# Patient Record
Sex: Female | Born: 1950
Health system: Southern US, Community
[De-identification: ages and names within clinical notes are randomized; demographics above are authoritative.]

## PROBLEM LIST (undated history)

## (undated) DIAGNOSIS — J13 Pneumonia due to Streptococcus pneumoniae: Secondary | ICD-10-CM

## (undated) DIAGNOSIS — E162 Hypoglycemia, unspecified: Secondary | ICD-10-CM

## (undated) DIAGNOSIS — M1990 Unspecified osteoarthritis, unspecified site: Secondary | ICD-10-CM

## (undated) HISTORY — PX: ABDOMINAL HYSTERECTOMY: SHX81

---

## 2003-10-29 ENCOUNTER — Emergency Department (HOSPITAL_COMMUNITY): Admission: AD | Admit: 2003-10-29 | Discharge: 2003-10-29 | Payer: Self-pay | Admitting: Family Medicine

## 2003-11-01 ENCOUNTER — Emergency Department (HOSPITAL_COMMUNITY): Admission: AD | Admit: 2003-11-01 | Discharge: 2003-11-01 | Payer: Self-pay | Admitting: Family Medicine

## 2007-05-12 ENCOUNTER — Emergency Department (HOSPITAL_COMMUNITY): Admission: EM | Admit: 2007-05-12 | Discharge: 2007-05-12 | Payer: Self-pay | Admitting: Emergency Medicine

## 2007-05-18 ENCOUNTER — Emergency Department (HOSPITAL_COMMUNITY): Admission: EM | Admit: 2007-05-18 | Discharge: 2007-05-18 | Payer: Self-pay | Admitting: Emergency Medicine

## 2008-04-30 ENCOUNTER — Emergency Department (HOSPITAL_COMMUNITY): Admission: EM | Admit: 2008-04-30 | Discharge: 2008-04-30 | Payer: Self-pay | Admitting: Family Medicine

## 2012-05-07 ENCOUNTER — Encounter (HOSPITAL_COMMUNITY): Payer: Self-pay | Admitting: *Deleted

## 2012-05-07 ENCOUNTER — Emergency Department (INDEPENDENT_AMBULATORY_CARE_PROVIDER_SITE_OTHER): Payer: Self-pay

## 2012-05-07 ENCOUNTER — Emergency Department (INDEPENDENT_AMBULATORY_CARE_PROVIDER_SITE_OTHER)
Admission: EM | Admit: 2012-05-07 | Discharge: 2012-05-07 | Disposition: A | Discharge status: Home / Self Care | Payer: Self-pay | Source: Home / Self Care

## 2012-05-07 DIAGNOSIS — I493 Ventricular premature depolarization: Secondary | ICD-10-CM

## 2012-05-07 DIAGNOSIS — J209 Acute bronchitis, unspecified: Secondary | ICD-10-CM

## 2012-05-07 DIAGNOSIS — I4949 Other premature depolarization: Secondary | ICD-10-CM

## 2012-05-07 DIAGNOSIS — F17200 Nicotine dependence, unspecified, uncomplicated: Secondary | ICD-10-CM

## 2012-05-07 HISTORY — DX: Unspecified osteoarthritis, unspecified site: M19.90

## 2012-05-07 HISTORY — DX: Pneumonia due to Streptococcus pneumoniae: J13

## 2012-05-07 HISTORY — DX: Hypoglycemia, unspecified: E16.2

## 2012-05-07 MED ORDER — ALBUTEROL SULFATE (5 MG/ML) 0.5% IN NEBU
2.5000 mg | INHALATION_SOLUTION | Freq: Once | RESPIRATORY_TRACT | Status: AC
  Administered 2012-05-07: 2.5 mg via RESPIRATORY_TRACT

## 2012-05-07 MED ORDER — ALBUTEROL SULFATE (5 MG/ML) 0.5% IN NEBU
INHALATION_SOLUTION | RESPIRATORY_TRACT | Status: AC
  Filled 2012-05-07: qty 0.5

## 2012-05-07 MED ORDER — DOXYCYCLINE HYCLATE 100 MG PO CAPS: 100.0000 mg | ORAL_CAPSULE | Freq: Two times a day (BID) | ORAL | Status: AC

## 2012-05-07 MED ORDER — ALBUTEROL SULFATE HFA 108 (90 BASE) MCG/ACT IN AERS: 2.0000 | INHALATION_SPRAY | RESPIRATORY_TRACT | Status: DC | PRN

## 2012-05-07 MED ORDER — ALBUTEROL SULFATE (5 MG/ML) 0.5% IN NEBU
2.5000 mg | INHALATION_SOLUTION | Freq: Once | RESPIRATORY_TRACT | Status: AC
Start: 2012-05-07 — End: 2012-05-07
  Administered 2012-05-07: 2.5 mg via RESPIRATORY_TRACT

## 2012-05-07 MED ORDER — IPRATROPIUM BROMIDE 0.02 % IN SOLN
0.5000 mg | Freq: Once | RESPIRATORY_TRACT | Status: AC
  Administered 2012-05-07: 0.5 mg via RESPIRATORY_TRACT

## 2012-05-07 NOTE — ED Provider Notes (Signed)
History     CSN: [id number]  Arrival date & time 05/07/12  0835   None     Chief Complaint  Patient presents with  . Cough  . Generalized Body Aches    (Consider location/radiation/quality/duration/timing/severity/associated sxs/prior treatment) HPI Comments: Presents with wheezing, cough, fever, sore throat, decreased apetite,. No sputum production.  St smokes 1PPD for >30 yrs. Denies dyspnea or chest pain.  Does not have a primary care provider.   Patient is a 61 y.o. female presenting with cough.  Cough Associated symptoms include wheezing. Pertinent negatives include no chest pain.    Past Medical History  Diagnosis Date  . Arthritis   . Hypoglycemia   . Pneumonia, pneumococcal     Past Surgical History  Procedure Date  . Abdominal hysterectomy     History reviewed. No pertinent family history.  History  Substance Use Topics  . Smoking status: Heavy Tobacco Smoker -- 1.0 packs/day  . Smokeless tobacco: Not on file  . Alcohol Use: No    OB History    Grav Para Term Preterm Abortions TAB SAB Ect Mult Living                  Review of Systems  Constitutional: Positive for fever, activity change and fatigue.  HENT: Positive for congestion and postnasal drip. Negative for neck pain.   Respiratory: Positive for cough and wheezing. Negative for chest tightness.   Cardiovascular: Negative for chest pain.  Gastrointestinal: Negative.   Genitourinary: Negative.   Musculoskeletal: Negative.     Allergies  Codeine  Home Medications   Current Outpatient Rx  Name Route Sig Dispense Refill  . ALBUTEROL SULFATE HFA 108 (90 BASE) MCG/ACT IN AERS Inhalation Inhale 2 puffs into the lungs every 4 (four) hours as needed for wheezing. 1 Inhaler 2  . DOXYCYCLINE HYCLATE 100 MG PO CAPS Oral Take 1 capsule (100 mg total) by mouth 2 (two) times daily. 20 capsule 0    BP 129/90  Pulse 88  Temp 98.5 F (36.9 C) (Oral)  Resp 32  SpO2 93%  Physical Exam    Constitutional: She is oriented to person, place, and time. She appears well-developed and well-nourished.  HENT:       EAC's with cerumen OP with minor erythema and clear PND  Neck: Normal range of motion. Neck supple.  Cardiovascular: Normal rate and normal heart sounds.   Pulmonary/Chest: She has wheezes.  Musculoskeletal: Normal range of motion.  Neurological: She is alert and oriented to person, place, and time.  Skin: Skin is warm and dry.    ED Course  Procedures (including critical care time)  Labs Reviewed - No data to display Dg Chest 2 View  05/07/2012  *RADIOLOGY REPORT*  Clinical Data: Cough, congestion and fever.  CHEST - 2 VIEW  Comparison: No priors.  Findings: Mild diffuse interstitial prominence and bronchial wall thickening may suggest mild bronchitis.  No focal consolidative airspace disease.  No pleural effusions.  Old healed fracture of the posterolateral aspect of the right seventh rib.  No pneumothorax.  Pulmonary vasculature is normal.  Heart size is normal. The patient is rotated to the left on today's exam, resulting in distortion of the mediastinal contours and reduced diagnostic sensitivity and specificity for mediastinal pathology. Atherosclerotic calcifications are noted within the arch of the aorta.  IMPRESSION: 1.  Mild diffuse interstitial prominence and bronchial wall thickening may suggest mild bronchitis.  Whether or not this is acute or chronic is uncertain, as  no prior studies are available for comparison. 2.  Atherosclerosis. 3.  Old healed fracture of the posterolateral aspect of the right seventh rib.   Original Report Authenticated By: Florencia Reasons, M.D.      1. Bronchitis with bronchospasm   2. Tobacco use disorder   3. PVC's (premature ventricular contractions)       MDM  Duoneb x 1. Post neb estimated 70% improvement in objective air movement and decrease in wheezing. She feels better and breathing better.  A second Albuterol is  ordered EKG: NSR with PVC's.  LVH with early repolarization changes. T wave inversions inferolateral leads Doxy 100 bid  X 10 d Albuterol HFA for home use.         Hayden Rasmussen, NP 05/07/12 1056

## 2012-05-07 NOTE — ED Provider Notes (Signed)
Medical screening examination/treatment/procedure(s) were performed by non-physician practitioner and as supervising physician I was immediately available for consultation/collaboration.  Raynald Blend, MD 05/07/12 1140

## 2012-05-07 NOTE — ED Notes (Signed)
Pt is here with complaints of non productive cough X 1 week with body aches.  Pt states she has hx of PNA, no asthma.  Smokes 1/pk a day.  Bilat wheezes noted.

## 2016-08-09 ENCOUNTER — Emergency Department (HOSPITAL_COMMUNITY)
Admission: EM | Admit: 2016-08-09 | Discharge: 2016-08-10 | Disposition: A | Discharge status: Home / Self Care | Payer: Medicare Other | Attending: Emergency Medicine | Admitting: Emergency Medicine

## 2016-08-09 ENCOUNTER — Encounter (HOSPITAL_COMMUNITY): Payer: Self-pay | Admitting: *Deleted

## 2016-08-09 DIAGNOSIS — F17200 Nicotine dependence, unspecified, uncomplicated: Secondary | ICD-10-CM | POA: Insufficient documentation

## 2016-08-09 DIAGNOSIS — R05 Cough: Secondary | ICD-10-CM | POA: Diagnosis present

## 2016-08-09 DIAGNOSIS — J069 Acute upper respiratory infection, unspecified: Secondary | ICD-10-CM | POA: Diagnosis not present

## 2016-08-09 MED ORDER — PSEUDOEPHEDRINE HCL ER 120 MG PO TB12
120.0000 mg | ORAL_TABLET | Freq: Two times a day (BID) | ORAL | Status: DC
  Administered 2016-08-09: 120 mg via ORAL
  Filled 2016-08-09: qty 1

## 2016-08-09 MED ORDER — PSEUDOEPHEDRINE HCL ER 120 MG PO TB12: 120.0000 mg | ORAL_TABLET | Freq: Two times a day (BID) | ORAL | 0 refills | Status: DC | PRN

## 2016-08-09 MED ORDER — GUAIFENESIN 100 MG/5ML PO SYRP: 100.0000 mg | ORAL_SOLUTION | ORAL | 0 refills | Status: DC | PRN

## 2016-08-09 MED ORDER — GUAIFENESIN 100 MG/5ML PO SOLN
5.0000 mL | Freq: Once | ORAL | Status: AC
  Administered 2016-08-09: 100 mg via ORAL
  Filled 2016-08-09: qty 5

## 2016-08-09 MED ORDER — IBUPROFEN 200 MG PO TABS
600.0000 mg | ORAL_TABLET | Freq: Once | ORAL | Status: AC
  Administered 2016-08-09: 600 mg via ORAL
  Filled 2016-08-09: qty 3

## 2016-08-09 NOTE — Discharge Instructions (Signed)
Take the medication as directed Follow up with your PCP Try visiting [facility] of Social [facility] to get your PCP changed to a [place] based physician

## 2016-08-09 NOTE — ED Provider Notes (Signed)
[facility]-EMERGENCY DEPT Provider Note   CSN: [id number] Arrival date & time: 08/09/16  2305 By signing my name below, I, [name], attest that this documentation has been prepared under the direction and in the presence of Earley FavorGail Meldon Hanzlik, FNP. Electronically Signed: [name], ED Scribe. 08/09/16. 11:27 PM.  History   Chief Complaint Chief Complaint  Patient presents with  . Nasal Congestion  . Cough   HPI Comments: [name] is a 65 y.o. female with no pertinent PMHx, who presents to the Emergency Department by EMS from a shelter complaining of nonproductive cough onset two weeks ago with associated congestion and decreased appetite. Pt states her pain is significantly worse at night or she's lying flat. She has not taken any OTC medications PTA. Denies h/o similar symptoms. Pt further denies fever.   The history is provided by the patient. No language interpreter was used.    Past Medical History:  Diagnosis Date  . Arthritis   . Hypoglycemia   . Pneumonia, pneumococcal (HCC)     There are no active problems to display for this patient.   Past Surgical History:  Procedure Laterality Date  . ABDOMINAL HYSTERECTOMY      OB History    No data available       Home Medications    Prior to Admission medications   Medication Sig Start Date End Date Taking? Authorizing Provider  albuterol (PROVENTIL HFA;VENTOLIN HFA) 108 (90 BASE) MCG/ACT inhaler Inhale 2 puffs into the lungs every 4 (four) hours as needed for wheezing. 05/07/12 05/07/13  Hayden Rasmussenavid Mabe, NP  guaifenesin (ROBITUSSIN) 100 MG/5ML syrup Take 5-10 mLs (100-200 mg total) by mouth every 4 (four) hours as needed for cough. 08/09/16   Earley FavorGail Saralee Bolick, NP  pseudoephedrine (SUDAFED 12 HOUR) 120 MG 12 hr tablet Take 1 tablet (120 mg total) by mouth every 12 (twelve) hours as needed for congestion. 08/09/16   Earley FavorGail Cesare Sumlin, NP    Family History No family history on file.  Social History Social History  Substance Use Topics  .  Smoking status: Heavy Tobacco Smoker    Packs/day: 1.00  . Smokeless tobacco: Never Used  . Alcohol use No     Allergies   Codeine   Review of Systems Review of Systems  Constitutional: Positive for appetite change. Negative for fever.  HENT: Positive for congestion, postnasal drip and rhinorrhea. Negative for sore throat.   Respiratory: Positive for cough.   All other systems reviewed and are negative.    Physical Exam Updated Vital Signs BP 132/58 (BP Location: Left Arm)   Pulse 110   Temp 98.9 F (37.2 C) (Oral)   Resp 18   SpO2 95%   Physical Exam  Constitutional: She appears well-developed and well-nourished.  HENT:  Head: Normocephalic.  Right Ear: External ear normal.  Left Ear: External ear normal.  Nose: Nose normal.  Mouth/Throat: Oropharynx is clear and moist.  Eyes: Conjunctivae are normal.  Neck: Normal range of motion.  Cardiovascular: Normal rate.   Pulmonary/Chest: Effort normal. No respiratory distress.  Abdominal: She exhibits no distension.  Musculoskeletal: Normal range of motion.  Neurological: She is alert.  Skin: Skin is warm and dry.  Psychiatric: She has a normal mood and affect. Her behavior is normal.  Nursing note and vitals reviewed.    ED Treatments / Results  DIAGNOSTIC STUDIES: Oxygen Saturation is 95% on RA, adequate by my interpretation.    COORDINATION OF CARE: 11:27 PM Discussed treatment plan with pt at  bedside which includes symptomatic treatment and pt agreed to plan.  Labs (all labs ordered are listed, but only abnormal results are displayed) Labs Reviewed - No data to display  EKG  EKG Interpretation None       Radiology No results found.  Procedures Procedures (including critical care time)  Medications Ordered in ED Medications  ibuprofen (ADVIL,MOTRIN) tablet 600 mg (not administered)  guaiFENesin (ROBITUSSIN) 100 MG/5ML solution 100 mg (not administered)  pseudoephedrine (SUDAFED) 12 hr tablet  120 mg (not administered)     Initial Impression / Assessment and Plan / ED Course  I have reviewed the triage vital signs and the nursing notes.  Pertinent labs & imaging results that were available during my care of the patient were reviewed by me and considered in my medical decision making (see chart for details).  Clinical Course   Patient will be given a decongestant and a cough medication and referral to community wellness as [place], and [facility]    Final Clinical Impressions(s) / ED Diagnoses   Final diagnoses:  Upper respiratory tract infection, unspecified type    New Prescriptions New Prescriptions   GUAIFENESIN (ROBITUSSIN) 100 MG/5ML SYRUP    Take 5-10 mLs (100-200 mg total) by mouth every 4 (four) hours as needed for cough.   PSEUDOEPHEDRINE (SUDAFED 12 HOUR) 120 MG 12 HR TABLET    Take 1 tablet (120 mg total) by mouth every 12 (twelve) hours as needed for congestion.   I personally performed the services described in this documentation, which was scribed in my presence. The recorded information has been reviewed and is accurate.    Earley FavorGail Paulino Cork, NP 08/09/16 [id number]    Earley FavorGail Caeleigh Prohaska, NP 08/09/16 [phone]    Mancel BaleElliott Wentz, MD 08/10/16 [phone]

## 2016-08-09 NOTE — ED Triage Notes (Signed)
Per EMS report: pt coming from a shelter and presents with a head congestion, unproductive cough, lack of appetite. EMS reports a slight diminished lung in pt's bases.  Pt a/o x 4 and ambulatory.  EMS VS: BP: 138/88, HR: 102, RR: 22, 95% RA, CBG: 129

## 2016-08-09 NOTE — ED Notes (Signed)
Bed: [id number] Expected date:  Expected time:  Means of arrival:  Comments: Congestion

## 2017-05-14 ENCOUNTER — Encounter (HOSPITAL_COMMUNITY): Payer: Self-pay | Admitting: Emergency Medicine

## 2017-05-14 DIAGNOSIS — Z5321 Procedure and treatment not carried out due to patient leaving prior to being seen by health care provider: Secondary | ICD-10-CM | POA: Diagnosis present

## 2017-05-14 NOTE — ED Triage Notes (Signed)
Pt states that she was bit by a spider today and now has lt hand swelling and pain.

## 2017-05-14 NOTE — ED Notes (Signed)
Pt not in the lobby when name called

## 2017-05-15 ENCOUNTER — Emergency Department (HOSPITAL_COMMUNITY)
Admission: EM | Admit: 2017-05-15 | Discharge: 2017-05-15 | Discharge status: Against Medical Advice | Payer: Medicare Other | Attending: Emergency Medicine | Admitting: Emergency Medicine

## 2017-05-15 NOTE — ED Notes (Signed)
No answer when called for vitals. 

## 2017-09-04 ENCOUNTER — Encounter (HOSPITAL_COMMUNITY): Payer: Self-pay

## 2017-09-04 ENCOUNTER — Emergency Department (HOSPITAL_COMMUNITY)
Admission: EM | Admit: 2017-09-04 | Discharge: 2017-09-04 | Disposition: A | Discharge status: Home / Self Care | Payer: Medicare Other | Attending: Emergency Medicine | Admitting: Emergency Medicine

## 2017-09-04 ENCOUNTER — Other Ambulatory Visit: Payer: Self-pay

## 2017-09-04 DIAGNOSIS — R600 Localized edema: Secondary | ICD-10-CM | POA: Insufficient documentation

## 2017-09-04 DIAGNOSIS — Z87891 Personal history of nicotine dependence: Secondary | ICD-10-CM | POA: Insufficient documentation

## 2017-09-04 DIAGNOSIS — R609 Edema, unspecified: Secondary | ICD-10-CM

## 2017-09-04 DIAGNOSIS — Z79899 Other long term (current) drug therapy: Secondary | ICD-10-CM | POA: Insufficient documentation

## 2017-09-04 DIAGNOSIS — R2243 Localized swelling, mass and lump, lower limb, bilateral: Secondary | ICD-10-CM | POA: Diagnosis present

## 2017-09-04 LAB — BASIC METABOLIC PANEL
Anion gap: 10 (ref 5–15)
BUN: 18 mg/dL (ref 6–20)
CALCIUM: 8.4 mg/dL — AB (ref 8.9–10.3)
CO2: 22 mmol/L (ref 22–32)
Chloride: 110 mmol/L (ref 101–111)
Creatinine, Ser: 1.13 mg/dL — ABNORMAL HIGH (ref 0.44–1.00)
GFR calc Af Amer: 57 mL/min — ABNORMAL LOW (ref >60)
GFR, EST NON AFRICAN AMERICAN: 50 mL/min — AB (ref >60)
GLUCOSE: 85 mg/dL (ref 65–99)
Potassium: 2.9 mmol/L — ABNORMAL LOW (ref 3.5–5.1)
Sodium: 142 mmol/L (ref 135–145)

## 2017-09-04 LAB — CBC
HCT: 37.5 % (ref 36.0–46.0)
Hemoglobin: 12.5 g/dL (ref 12.0–15.0)
MCH: 30.7 pg (ref 26.0–34.0)
MCHC: 33.3 g/dL (ref 30.0–36.0)
MCV: 92.1 fL (ref 78.0–100.0)
PLATELETS: 198 10*3/uL (ref 150–400)
RBC: 4.07 MIL/uL (ref 3.87–5.11)
RDW: 15.7 % — AB (ref 11.5–15.5)
WBC: 9 10*3/uL (ref 4.0–10.5)

## 2017-09-04 LAB — BRAIN NATRIURETIC PEPTIDE: B Natriuretic Peptide: 204.1 pg/mL — ABNORMAL HIGH (ref 0.0–100.0)

## 2017-09-04 MED ORDER — POTASSIUM CHLORIDE CRYS ER 20 MEQ PO TBCR
40.0000 meq | EXTENDED_RELEASE_TABLET | Freq: Once | ORAL | Status: AC
Start: 2017-09-04 — End: 2017-09-04
  Administered 2017-09-04: 40 meq via ORAL
  Filled 2017-09-04: qty 2

## 2017-09-04 NOTE — ED Notes (Addendum)
Pt verbalizes understanding of d/c instructions. Pt ambulatory at d/c with all belongings.   

## 2017-09-04 NOTE — ED Provider Notes (Signed)
Patient placed in Quick Look pathway, seen and evaluated for chief complaint of BLE edema x several days. Hx same.  Pertinent H&P findings include no CP/SOB. No N/V/D. No leg pain. No pain in general. Lasix in past. None now.  Based on initial evaluation, labs are indicated and radiology studies are not indicated.  Patient counseled on process, plan, and necessity for staying for completing the evaluation.    Audry PiliMohr, Keondra Haydu, PA-C 09/04/17 1511    Margarita Grizzleay, Danielle, MD 09/04/17 [phone]

## 2017-09-04 NOTE — ED Triage Notes (Addendum)
Bilateral lower extremity edema x 3 days. Hx of same. 3+ pitting up to knees VS 122/62 Hr 84 18 rr cbg 136

## 2017-09-04 NOTE — ED Provider Notes (Signed)
[facility] Provider Note   CSN: [id number] Arrival date & time: 09/04/17  1500     History   Chief Complaint No chief complaint on file.   HPI [name] is a 67 y.o. female.  Patient presents to the ED with a chief complaint of lower extremity swelling.  She states that this is something that she has lived with for years.  She reports that the swelling comes and goes, but is generally improved when she is walking more.  She states that recently she has been walking less because she has been dealing with trying to find new housing.  She states that she has taken lasix in the past, but is off of it now.  She denies any chest pain, SOB, fever, chills, or cough.  She reports chronic urge incontinence.     The history is provided by the patient. No language interpreter was used.    Past Medical History:  Diagnosis Date  . Arthritis   . Hypoglycemia   . Pneumonia, pneumococcal (HCC)     There are no active problems to display for this patient.   Past Surgical History:  Procedure Laterality Date  . ABDOMINAL HYSTERECTOMY      OB History    No data available       Home Medications    Prior to Admission medications   Medication Sig Start Date End Date Taking? Authorizing Provider  acetaminophen (TYLENOL) 325 MG tablet Take 650 mg by mouth every 6 (six) hours as needed for mild pain.   Yes [provider]    Family History No family history on file.  Social History Social History   Tobacco Use  . Smoking status: Heavy Tobacco Smoker    Packs/day: 1.00  . Smokeless tobacco: Never Used  Substance Use Topics  . Alcohol use: No  . Drug use: No     Allergies   Codeine   Review of Systems Review of Systems  All other systems reviewed and are negative.    Physical Exam Updated Vital Signs BP (!) 119/42 (BP Location: Left Arm)   Pulse 78   Temp 98.7 F (37.1 C) (Oral)   Resp 16   SpO2 98%    Physical Exam  Constitutional: She is oriented to person, place, and time. She appears well-developed and well-nourished.  HENT:  Head: Normocephalic and atraumatic.  Eyes: Conjunctivae and EOM are normal. Pupils are equal, round, and reactive to light.  Neck: Normal range of motion. Neck supple.  Cardiovascular: Normal rate and regular rhythm. Exam reveals no gallop and no friction rub.  No murmur heard. Pulmonary/Chest: Effort normal and breath sounds normal. No respiratory distress. She has no wheezes. She has no rales. She exhibits no tenderness.  CTAB  Abdominal: Soft. Bowel sounds are normal. She exhibits no distension and no mass. There is no tenderness. There is no rebound and no guarding.  Musculoskeletal: Normal range of motion. She exhibits edema. She exhibits no tenderness.  1+ pitting edema in bilateral lower extremities  Neurological: She is alert and oriented to person, place, and time.  Skin: Skin is warm and dry.  No evidence of cellulitis or abscess  Psychiatric: She has a normal mood and affect. Her behavior is normal. Judgment and thought content normal.  Nursing note and vitals reviewed.    ED Treatments / Results  Labs (all labs ordered are listed, but only abnormal results are displayed) Labs Reviewed  CBC - Abnormal;  Notable for the following components:      Result Value   RDW 15.7 (*)    All other components within normal limits  BASIC METABOLIC PANEL - Abnormal; Notable for the following components:   Potassium 2.9 (*)    Creatinine, Ser 1.13 (*)    Calcium 8.4 (*)    GFR calc non Af Amer 50 (*)    GFR calc Af Amer 57 (*)    All other components within normal limits  BRAIN NATRIURETIC PEPTIDE - Abnormal; Notable for the following components:   B Natriuretic Peptide 204.1 (*)    All other components within normal limits    EKG  EKG Interpretation None       Radiology No results found.  Procedures Procedures (including critical care  time)  Medications Ordered in ED Medications  potassium chloride SA (K-DUR,KLOR-CON) CR tablet 40 mEq (not administered)     Initial Impression / Assessment and Plan / ED Course  I have reviewed the triage vital signs and the nursing notes.  Pertinent labs & imaging results that were available during my care of the patient were reviewed by me and considered in my medical decision making (see chart for details).     Patient with bilateral lower extremity swelling.  Acute on chronic.  VSS.  No chest pain or SOB.  Bilateral, doubt DVT.  No sign of infection.    Recommend PCP follow-up, compression stockings, and elevation.  Patient understands and agrees with the plan.    Will replete K.  Final Clinical Impressions(s) / ED Diagnoses   Final diagnoses:  Peripheral edema    ED Discharge Orders    None       Roxy Horseman, PA-C 09/04/17 2312    Loren Racer, MD 09/04/17 [phone]

## 2017-09-10 ENCOUNTER — Emergency Department (HOSPITAL_COMMUNITY)
Admission: EM | Admit: 2017-09-10 | Discharge: 2017-09-10 | Disposition: A | Discharge status: Home / Self Care | Payer: Medicare Other | Attending: Emergency Medicine | Admitting: Emergency Medicine

## 2017-09-10 ENCOUNTER — Encounter (HOSPITAL_COMMUNITY): Payer: Self-pay | Admitting: Emergency Medicine

## 2017-09-10 DIAGNOSIS — M25562 Pain in left knee: Secondary | ICD-10-CM | POA: Insufficient documentation

## 2017-09-10 DIAGNOSIS — M1990 Unspecified osteoarthritis, unspecified site: Secondary | ICD-10-CM | POA: Insufficient documentation

## 2017-09-10 DIAGNOSIS — M25561 Pain in right knee: Secondary | ICD-10-CM | POA: Diagnosis present

## 2017-09-10 DIAGNOSIS — R609 Edema, unspecified: Secondary | ICD-10-CM | POA: Diagnosis not present

## 2017-09-10 DIAGNOSIS — F17200 Nicotine dependence, unspecified, uncomplicated: Secondary | ICD-10-CM | POA: Diagnosis not present

## 2017-09-10 LAB — COMPREHENSIVE METABOLIC PANEL
ALK PHOS: 97 U/L (ref 38–126)
ALT: 26 U/L (ref 14–54)
AST: 30 U/L (ref 15–41)
Albumin: 2.8 g/dL — ABNORMAL LOW (ref 3.5–5.0)
Anion gap: 10 (ref 5–15)
BILIRUBIN TOTAL: 0.4 mg/dL (ref 0.3–1.2)
BUN: 14 mg/dL (ref 6–20)
CALCIUM: 8.4 mg/dL — AB (ref 8.9–10.3)
CHLORIDE: 104 mmol/L (ref 101–111)
CO2: 24 mmol/L (ref 22–32)
CREATININE: 1.06 mg/dL — AB (ref 0.44–1.00)
GFR, EST NON AFRICAN AMERICAN: 53 mL/min — AB (ref >60)
Glucose, Bld: 89 mg/dL (ref 65–99)
Potassium: 4.2 mmol/L (ref 3.5–5.1)
Sodium: 138 mmol/L (ref 135–145)
TOTAL PROTEIN: 5.9 g/dL — AB (ref 6.5–8.1)

## 2017-09-10 LAB — CBC WITH DIFFERENTIAL/PLATELET
Basophils Absolute: 0 10*3/uL (ref 0.0–0.1)
Basophils Relative: 1 %
EOS PCT: 3 %
Eosinophils Absolute: 0.2 10*3/uL (ref 0.0–0.7)
HEMATOCRIT: 37 % (ref 36.0–46.0)
Hemoglobin: 11.7 g/dL — ABNORMAL LOW (ref 12.0–15.0)
LYMPHS ABS: 1.8 10*3/uL (ref 0.7–4.0)
LYMPHS PCT: 26 %
MCH: 30.1 pg (ref 26.0–34.0)
MCHC: 31.6 g/dL (ref 30.0–36.0)
MCV: 95.1 fL (ref 78.0–100.0)
Monocytes Absolute: 0.5 10*3/uL (ref 0.1–1.0)
Monocytes Relative: 7 %
Neutro Abs: 4.3 10*3/uL (ref 1.7–7.7)
Neutrophils Relative %: 63 %
PLATELETS: 178 10*3/uL (ref 150–400)
RBC: 3.89 MIL/uL (ref 3.87–5.11)
RDW: 15.2 % (ref 11.5–15.5)
WBC: 6.7 10*3/uL (ref 4.0–10.5)

## 2017-09-10 MED ORDER — IBUPROFEN 400 MG PO TABS
400.0000 mg | ORAL_TABLET | Freq: Once | ORAL | Status: AC
  Administered 2017-09-10: 400 mg via ORAL
  Filled 2017-09-10: qty 1

## 2017-09-10 MED ORDER — POTASSIUM CHLORIDE CRYS ER 20 MEQ PO TBCR
20.0000 meq | EXTENDED_RELEASE_TABLET | Freq: Once | ORAL | Status: AC
  Administered 2017-09-10: 20 meq via ORAL
  Filled 2017-09-10: qty 1

## 2017-09-10 MED ORDER — FUROSEMIDE 20 MG PO TABS
20.0000 mg | ORAL_TABLET | Freq: Once | ORAL | Status: AC
  Administered 2017-09-10: 20 mg via ORAL
  Filled 2017-09-10: qty 1

## 2017-09-10 NOTE — ED Notes (Signed)
Pt stable upon discharge. Provided resources from Child psychotherapistsocial worker and bus pass.

## 2017-09-10 NOTE — ED Notes (Signed)
Pt. In pod E cleaning self up. Will transfer back to hall once she is finished.

## 2017-09-10 NOTE — ED Notes (Signed)
Social worker at bedside to speak with patient

## 2017-09-10 NOTE — ED Triage Notes (Signed)
Pt arrives via EMS from street with c/o knee pain x1 week and bilateral leg swelling for a month.

## 2017-09-10 NOTE — ED Notes (Addendum)
Upon attempting to discharge patient, patient states "I do not want to be discharged." pt advised that the doctor does not have any criteria to admit her and that we are providing her resources from the social work. Pt being uncooperative when this RN requesting she verify her name and birthday, refused vital signs.

## 2017-09-10 NOTE — ED Provider Notes (Signed)
[facility] EMERGENCY DEPARTMENT Provider Note   CSN: [id number] Arrival date & time: 09/10/17  0046     History   Chief Complaint Chief Complaint  Patient presents with  . Knee Pain    HPI [name] is a 67 y.o. female.  Patient presents to the emergency department for evaluation of bilateral knee pain.  Patient reports that she has a history of chronic knee pain secondary to arthritis.  She is also complaining of swelling of both of her legs.  This has been chronic and she has been seen for this in the past.  She tells me she was prescribed Lasix and potassium at her previous visit but did not get these filled.  Patient reports that she is unable to care for herself.  I suspect that she is homeless.  She is asking to talk to a Child psychotherapist.      Past Medical History:  Diagnosis Date  . Arthritis   . Hypoglycemia   . Pneumonia, pneumococcal (HCC)     There are no active problems to display for this patient.   Past Surgical History:  Procedure Laterality Date  . ABDOMINAL HYSTERECTOMY      OB History    No data available       Home Medications    Prior to Admission medications   Medication Sig Start Date End Date Taking? Authorizing Provider  acetaminophen (TYLENOL) 325 MG tablet Take 650 mg by mouth every 6 (six) hours as needed for mild pain.    [provider]    Family History History reviewed. No pertinent family history.  Social History Social History   Tobacco Use  . Smoking status: Heavy Tobacco Smoker    Packs/day: 1.00  . Smokeless tobacco: Never Used  Substance Use Topics  . Alcohol use: No  . Drug use: No     Allergies   Codeine   Review of Systems Review of Systems  Cardiovascular: Positive for leg swelling.  Musculoskeletal: Positive for arthralgias.  All other systems reviewed and are negative.    Physical Exam Updated Vital Signs BP 105/62 (BP Location: Right Arm)   Pulse 97   Temp  98.7 F (37.1 C) (Oral)   Resp 18   Ht 5' 6.5" (1.689 m)   SpO2 99%   Physical Exam  Constitutional: She is oriented to person, place, and time. She appears well-developed and well-nourished. No distress.  HENT:  Head: Normocephalic and atraumatic.  Right Ear: Hearing normal.  Left Ear: Hearing normal.  Nose: Nose normal.  Mouth/Throat: Oropharynx is clear and moist and mucous membranes are normal.  Eyes: Conjunctivae and EOM are normal. Pupils are equal, round, and reactive to light.  Neck: Normal range of motion. Neck supple.  Cardiovascular: Regular rhythm, S1 normal and S2 normal. Exam reveals no gallop and no friction rub.  No murmur heard. Pulmonary/Chest: Effort normal and breath sounds normal. No respiratory distress. She exhibits no tenderness.  Abdominal: Soft. Normal appearance and bowel sounds are normal. There is no hepatosplenomegaly. There is no tenderness. There is no rebound, no guarding, no tenderness at McBurney's point and negative Murphy's sign. No hernia.  Musculoskeletal: Normal range of motion. She exhibits edema (1+ bilat).       Right knee: She exhibits normal range of motion, no swelling, no effusion and no erythema. Tenderness found.       Left knee: She exhibits normal range of motion, no swelling, no effusion and no erythema.  Tenderness found.  Neurological: She is alert and oriented to person, place, and time. She has normal strength. No cranial nerve deficit or sensory deficit. Coordination normal. GCS eye subscore is 4. GCS verbal subscore is 5. GCS motor subscore is 6.  Skin: Skin is warm, dry and intact. No rash noted. No cyanosis.  Psychiatric: She has a normal mood and affect. Her speech is normal and behavior is normal. Thought content normal.  Nursing note and vitals reviewed.    ED Treatments / Results  Labs (all labs ordered are listed, but only abnormal results are displayed) Labs Reviewed  CBC WITH DIFFERENTIAL/PLATELET - Abnormal; Notable  for the following components:      Result Value   Hemoglobin 11.7 (*)    All other components within normal limits  COMPREHENSIVE METABOLIC PANEL - Abnormal; Notable for the following components:   Creatinine, Ser 1.06 (*)    Calcium 8.4 (*)    Total Protein 5.9 (*)    Albumin 2.8 (*)    GFR calc non Af Amer 53 (*)    All other components within normal limits    EKG  EKG Interpretation None       Radiology No results found.  Procedures Procedures (including critical care time)  Medications Ordered in ED Medications  furosemide (LASIX) tablet 20 mg (not administered)  potassium chloride SA (K-DUR,KLOR-CON) CR tablet 20 mEq (not administered)  ibuprofen (ADVIL,MOTRIN) tablet 400 mg (not administered)     Initial Impression / Assessment and Plan / ED Course  I have reviewed the triage vital signs and the nursing notes.  Pertinent labs & imaging results that were available during my care of the patient were reviewed by me and considered in my medical decision making (see chart for details).     Patient presents to the emergency department for evaluation of bilateral knee pain.  This is a chronic condition for her.  Examination reveals mild tenderness without swelling, effusion, erythema or warmth.  No concern for trauma, no concern for infection.  Patient also has mild edema of both lower extremities.  She was recently seen for this and worked up.  I suspect patient came in because she is homeless.  She is asking to speak with social work.  She will be held here this morning until social work available.  Final Clinical Impressions(s) / ED Diagnoses   Final diagnoses:  Arthritis  Peripheral edema    ED Discharge Orders    None       Gilda CreasePollina, Christopher J, MD 09/10/17 [phone]

## 2017-09-10 NOTE — Progress Notes (Signed)
CSW spoke with pt at bedside. Pt reports that pt can no longer care for self as well as expressed being homeless. CSW was informed by pt that pt is looking for long term placement or even short term placement at this time. CSW informed pt that with Medicare Part B, there are barriers that limit the ability to place pt. CSW sought further information from pt on thoughts about getting Medicaid considering pt is wanting long term placement. Pt expressed not having anything to sign over then stated "I aint signing over my check, its all a scam". CSW expressed verbal understanding to pt's comment and provided pt with further resources to ensure that pt is getting the assistance that pt is wanting at this time. At this time there are no further CSW needs. CSW signing off.     Claude MangesKierra S. Gearldene Fiorenza, MSW, LCSW-A Emergency Department Clinical Social Worker [phone]

## 2017-09-13 ENCOUNTER — Emergency Department (HOSPITAL_COMMUNITY): Payer: Medicare Other

## 2017-09-13 ENCOUNTER — Encounter (HOSPITAL_COMMUNITY): Payer: Self-pay | Admitting: *Deleted

## 2017-09-13 ENCOUNTER — Emergency Department (HOSPITAL_COMMUNITY)
Admission: EM | Admit: 2017-09-13 | Discharge: 2017-09-13 | Disposition: A | Discharge status: Home / Self Care | Payer: Medicare Other | Attending: Emergency Medicine | Admitting: Emergency Medicine

## 2017-09-13 ENCOUNTER — Other Ambulatory Visit: Payer: Self-pay

## 2017-09-13 DIAGNOSIS — Y999 Unspecified external cause status: Secondary | ICD-10-CM | POA: Insufficient documentation

## 2017-09-13 DIAGNOSIS — S42202A Unspecified fracture of upper end of left humerus, initial encounter for closed fracture: Secondary | ICD-10-CM | POA: Diagnosis not present

## 2017-09-13 DIAGNOSIS — Y939 Activity, unspecified: Secondary | ICD-10-CM | POA: Insufficient documentation

## 2017-09-13 DIAGNOSIS — X509XXA Other and unspecified overexertion or strenuous movements or postures, initial encounter: Secondary | ICD-10-CM | POA: Insufficient documentation

## 2017-09-13 DIAGNOSIS — F17200 Nicotine dependence, unspecified, uncomplicated: Secondary | ICD-10-CM | POA: Insufficient documentation

## 2017-09-13 DIAGNOSIS — Y929 Unspecified place or not applicable: Secondary | ICD-10-CM | POA: Insufficient documentation

## 2017-09-13 DIAGNOSIS — W19XXXA Unspecified fall, initial encounter: Secondary | ICD-10-CM

## 2017-09-13 DIAGNOSIS — S4992XA Unspecified injury of left shoulder and upper arm, initial encounter: Secondary | ICD-10-CM | POA: Diagnosis present

## 2017-09-13 MED ORDER — HYDROCODONE-ACETAMINOPHEN 5-325 MG PO TABS: 2.0000 | ORAL_TABLET | ORAL | 0 refills | Status: DC | PRN

## 2017-09-13 MED ORDER — HYDROCODONE-ACETAMINOPHEN 5-325 MG PO TABS
1.0000 | ORAL_TABLET | Freq: Once | ORAL | Status: AC
  Administered 2017-09-13: 1 via ORAL
  Filled 2017-09-13 (×2): qty 1

## 2017-09-13 NOTE — ED Triage Notes (Signed)
Pt reports tripping and falling, landed on left arm and having pain, states she felt something pop. Also requests assistance finding a place to live.

## 2017-09-13 NOTE — ED Provider Notes (Signed)
[facility] EMERGENCY DEPARTMENT Provider Note   CSN: [id number] Arrival date & time: 09/13/17  0944     History   Chief Complaint Chief Complaint  Patient presents with  . Fall  . Arm Pain    HPI [name] is a 67 y.o. female.  67 year old female presents following a mechanical fall.  She reports that she lost her balance and fell hard onto the left upper arm.  She complains of pain to the left upper arm.  She denies head injury or neck pain.  She denies other injury.  She denies associated chest pain or shortness of breath.  She reports significant discomfort to the left upper arm especially with movement.  She reports that she is homeless.   The history is provided by the patient.  Fall  This is a new problem. The current episode started 1 to 2 hours ago. The problem occurs constantly. The problem has not changed since onset.Pertinent negatives include no chest pain and no abdominal pain. Exacerbated by: Movement of left arm. Nothing relieves the symptoms. She has tried nothing for the symptoms.  Arm Pain  Pertinent negatives include no chest pain and no abdominal pain.    Past Medical History:  Diagnosis Date  . Arthritis   . Hypoglycemia   . Pneumonia, pneumococcal (HCC)     There are no active problems to display for this patient.   Past Surgical History:  Procedure Laterality Date  . ABDOMINAL HYSTERECTOMY      OB History    No data available       Home Medications    Prior to Admission medications   Medication Sig Start Date End Date Taking? Authorizing Provider  acetaminophen (TYLENOL) 325 MG tablet Take 650 mg by mouth every 6 (six) hours as needed for mild pain.    [provider]    Family History History reviewed. No pertinent family history.  Social History Social History   Tobacco Use  . Smoking status: Heavy Tobacco Smoker    Packs/day: 1.00  . Smokeless tobacco: Never Used  Substance Use Topics  .  Alcohol use: No  . Drug use: No     Allergies   Codeine   Review of Systems Review of Systems  Cardiovascular: Negative for chest pain.  Gastrointestinal: Negative for abdominal pain.  All other systems reviewed and are negative.    Physical Exam Updated Vital Signs BP (!) 141/58   Pulse 97   Temp 98.7 F (37.1 C) (Oral)   Resp 16   SpO2 100%   Physical Exam  Constitutional: She is oriented to person, place, and time. She appears well-developed and well-nourished. No distress.  HENT:  Head: Normocephalic and atraumatic.  Mouth/Throat: Oropharynx is clear and moist.  Eyes: Conjunctivae and EOM are normal. Pupils are equal, round, and reactive to light.  Neck: Normal range of motion. Neck supple.  Cardiovascular: Normal rate, regular rhythm and normal heart sounds.  Pulmonary/Chest: Effort normal and breath sounds normal. No respiratory distress.  Abdominal: Soft. She exhibits no distension. There is no tenderness.  Musculoskeletal: She exhibits tenderness. She exhibits no edema or deformity.  Tender with palpation to the left upper and mid humerus.  Active range of motion to the left upper extremity is limited secondary to pain.  Distal left upper extremity is neurovascular intact.  Neurological: She is alert and oriented to person, place, and time.  Skin: Skin is warm and dry.  Psychiatric: She has a  normal mood and affect.  Nursing note and vitals reviewed.    ED Treatments / Results  Labs (all labs ordered are listed, but only abnormal results are displayed) Labs Reviewed - No data to display  EKG  EKG Interpretation None       Radiology Dg Shoulder Left  Result Date: 09/13/2017 CLINICAL DATA:  Status post fall with left shoulder pain. EXAM: LEFT SHOULDER - 2+ VIEW COMPARISON:  None. FINDINGS: There is a mildly comminuted impacted transverse fracture through the surgical neck the left humerus. There is a probable posterior dislocation at the left  glenohumeral joint. Increased soft tissue surrounding the humeral head may represent intracapsular hematoma. IMPRESSION: Comminuted impacted left proximal humerus fracture with probable posterior left shoulder dislocation. Electronically Signed   By: Ted Mcalpineobrinka  Dimitrova M.D.   On: 09/13/2017 13:51   Dg Humerus Left  Result Date: 09/13/2017 CLINICAL DATA:  Acute left shoulder pain following fall today. Initial encounter. EXAM: LEFT HUMERUS - 2+ VIEW COMPARISON:  05/07/2012 chest radiograph FINDINGS: A minimally comminuted left humeral neck fracture is noted with mild shortening. There is equivocal fracture extension into the humeral head. No dislocation. IMPRESSION: Minimally comminuted left humeral neck fracture with mild shortening. Electronically Signed   By: Harmon PierJeffrey  Hu M.D.   On: 09/13/2017 13:48    Procedures Procedures (including critical care time)  Medications Ordered in ED Medications  HYDROcodone-acetaminophen (NORCO/VICODIN) 5-325 MG per tablet 1 tablet (1 tablet Oral Given 09/13/17 1050)     Initial Impression / Assessment and Plan / ED Course  I have reviewed the triage vital signs and the nursing notes.  Pertinent labs & imaging results that were available during my care of the patient were reviewed by me and considered in my medical decision making (see chart for details).     MDM screen complete.  Patient is presenting following mechanical fall.  She has a minimally displaced left proximal humerus fracture.  There is no evidence of dislocation of the left shoulder on exam.  She is otherwise without significant injury.  She desires discharge home after treatment in the ED.  She is aware of need for close follow-up with orthopedics.  She was given strict return precautions and understands them.   Final Clinical Impressions(s) / ED Diagnoses   Final diagnoses:  Fall, initial encounter  Closed fracture of proximal end of left humerus, unspecified fracture morphology,  initial encounter    ED Discharge Orders        Ordered    HYDROcodone-acetaminophen (NORCO/VICODIN) 5-325 MG tablet  Every 4 hours PRN     09/13/17 1442       Wynetta FinesMessick, Derwood Becraft C, MD 09/13/17 1453

## 2017-09-18 ENCOUNTER — Encounter (HOSPITAL_COMMUNITY): Payer: Self-pay | Admitting: Obstetrics and Gynecology

## 2017-09-18 ENCOUNTER — Emergency Department (HOSPITAL_COMMUNITY)
Admission: EM | Admit: 2017-09-18 | Discharge: 2017-09-19 | Disposition: A | Discharge status: Home / Self Care | Payer: Medicare Other | Attending: Emergency Medicine | Admitting: Emergency Medicine

## 2017-09-18 ENCOUNTER — Emergency Department (HOSPITAL_COMMUNITY): Payer: Medicare Other

## 2017-09-18 DIAGNOSIS — N390 Urinary tract infection, site not specified: Secondary | ICD-10-CM | POA: Insufficient documentation

## 2017-09-18 DIAGNOSIS — R1032 Left lower quadrant pain: Secondary | ICD-10-CM | POA: Diagnosis present

## 2017-09-18 DIAGNOSIS — R509 Fever, unspecified: Secondary | ICD-10-CM | POA: Diagnosis not present

## 2017-09-18 DIAGNOSIS — F17210 Nicotine dependence, cigarettes, uncomplicated: Secondary | ICD-10-CM | POA: Insufficient documentation

## 2017-09-18 DIAGNOSIS — R05 Cough: Secondary | ICD-10-CM | POA: Insufficient documentation

## 2017-09-18 LAB — COMPREHENSIVE METABOLIC PANEL
ALT: 20 U/L (ref 14–54)
AST: 29 U/L (ref 15–41)
Albumin: 2.6 g/dL — ABNORMAL LOW (ref 3.5–5.0)
Alkaline Phosphatase: 67 U/L (ref 38–126)
Anion gap: 9 (ref 5–15)
BILIRUBIN TOTAL: 0.8 mg/dL (ref 0.3–1.2)
BUN: 16 mg/dL (ref 6–20)
CO2: 25 mmol/L (ref 22–32)
CREATININE: 0.86 mg/dL (ref 0.44–1.00)
Calcium: 8 mg/dL — ABNORMAL LOW (ref 8.9–10.3)
Chloride: 104 mmol/L (ref 101–111)
GFR calc Af Amer: >60 mL/min (ref >60)
Glucose, Bld: 94 mg/dL (ref 65–99)
Potassium: 3.2 mmol/L — ABNORMAL LOW (ref 3.5–5.1)
Sodium: 138 mmol/L (ref 135–145)
TOTAL PROTEIN: 5.9 g/dL — AB (ref 6.5–8.1)

## 2017-09-18 LAB — CBC WITH DIFFERENTIAL/PLATELET
BASOS ABS: 0.1 10*3/uL (ref 0.0–0.1)
Basophils Relative: 1 %
EOS ABS: 0.1 10*3/uL (ref 0.0–0.7)
EOS PCT: 2 %
HCT: 31.9 % — ABNORMAL LOW (ref 36.0–46.0)
Hemoglobin: 10.1 g/dL — ABNORMAL LOW (ref 12.0–15.0)
Lymphocytes Relative: 27 %
Lymphs Abs: 1.3 10*3/uL (ref 0.7–4.0)
MCH: 29.8 pg (ref 26.0–34.0)
MCHC: 31.7 g/dL (ref 30.0–36.0)
MCV: 94.1 fL (ref 78.0–100.0)
Monocytes Absolute: 0.6 10*3/uL (ref 0.1–1.0)
Monocytes Relative: 11 %
Neutro Abs: 2.9 10*3/uL (ref 1.7–7.7)
Neutrophils Relative %: 59 %
PLATELETS: 257 10*3/uL (ref 150–400)
RBC: 3.39 MIL/uL — AB (ref 3.87–5.11)
RDW: 14.9 % (ref 11.5–15.5)
WBC: 5 10*3/uL (ref 4.0–10.5)

## 2017-09-18 LAB — I-STAT CG4 LACTIC ACID, ED: Lactic Acid, Venous: 0.69 mmol/L (ref 0.5–1.9)

## 2017-09-18 NOTE — ED Notes (Signed)
Pt reports a headache and wants to eat.

## 2017-09-18 NOTE — ED Notes (Signed)
Patient is aware that a urine specimen is needed. Patient unable to void at present moment.

## 2017-09-18 NOTE — ED Triage Notes (Signed)
Per EMS: Pt is homeless and called EMS for having flank pain, cough and feeling warm  BP 110/70 HR 100 97% O2 on RA Temp 101.7 Tympanic  EMS reports bilateral wheezing Edema in both legs also reported

## 2017-09-18 NOTE — ED Notes (Signed)
Bed: [id number] Expected date:  Expected time:  Means of arrival:  Comments: Weakness, cough, fever

## 2017-09-19 DIAGNOSIS — N390 Urinary tract infection, site not specified: Secondary | ICD-10-CM | POA: Diagnosis not present

## 2017-09-19 LAB — URINALYSIS, ROUTINE W REFLEX MICROSCOPIC
BILIRUBIN URINE: NEGATIVE
GLUCOSE, UA: NEGATIVE mg/dL
KETONES UR: 5 mg/dL — AB
NITRITE: POSITIVE — AB
PH: 5 (ref 5.0–8.0)
Protein, ur: 30 mg/dL — AB
Specific Gravity, Urine: 1.026 (ref 1.005–1.030)

## 2017-09-19 MED ORDER — LIDOCAINE HCL (PF) 1 % IJ SOLN
INTRAMUSCULAR | Status: AC
  Administered 2017-09-19: 5 mL
  Filled 2017-09-19: qty 5

## 2017-09-19 MED ORDER — CEFTRIAXONE SODIUM 1 G IJ SOLR
1.0000 g | Freq: Once | INTRAMUSCULAR | Status: AC
  Administered 2017-09-19: 1 g via INTRAMUSCULAR
  Filled 2017-09-19: qty 10

## 2017-09-19 MED ORDER — CEPHALEXIN 500 MG PO CAPS: 500.0000 mg | ORAL_CAPSULE | Freq: Four times a day (QID) | ORAL | 0 refills | Status: DC

## 2017-09-19 MED ORDER — ACETAMINOPHEN 500 MG PO TABS
1000.0000 mg | ORAL_TABLET | Freq: Once | ORAL | Status: AC
  Administered 2017-09-19: 1000 mg via ORAL
  Filled 2017-09-19: qty 2

## 2017-09-19 NOTE — ED Provider Notes (Signed)
[facility]-EMERGENCY DEPT Provider Note   CSN: [id number] Arrival date & time: 09/18/17  2133     History   Chief Complaint Chief Complaint  Patient presents with  . Flank Pain  . Fever  . Cough    HPI [name] is a 67 y.o. female.  The history is provided by the patient.  Fever   This is a new problem. The current episode started yesterday. The problem occurs constantly. The problem has not changed since onset.Her temperature was unmeasured prior to arrival. Associated symptoms include cough. Pertinent negatives include no vomiting, no congestion and no sore throat. Associated symptoms comments: Dysuria . She has tried nothing for the symptoms. The treatment provided no relief.  Cough  This is a new problem. The current episode started yesterday. The problem occurs constantly. The problem has not changed since onset.The cough is non-productive. Pertinent negatives include no sore throat and no eye redness. She has tried nothing for the symptoms. The treatment provided no relief. Her past medical history does not include bronchiectasis.  Dysuria   This is a new problem. The current episode started yesterday. The problem occurs every urination. The problem has not changed since onset.The quality of the pain is described as burning. The pain is moderate. She is not sexually active. Associated symptoms include frequency. Pertinent negatives include no vomiting, no discharge, no hematuria and no flank pain. She has tried nothing for the symptoms. Her past medical history does not include urinary stasis.  Dysuria   Past Medical History:  Diagnosis Date  . Arthritis   . Hypoglycemia   . Pneumonia, pneumococcal (HCC)     There are no active problems to display for this patient.   Past Surgical History:  Procedure Laterality Date  . ABDOMINAL HYSTERECTOMY      OB History    Gravida Para Term Preterm AB Living             3   SAB TAB Ectopic Multiple  Live Births                   Home Medications    Prior to Admission medications   Medication Sig Start Date End Date Taking? Authorizing Provider  acetaminophen (TYLENOL) 500 MG tablet Take 1,000 mg by mouth every 6 (six) hours as needed for mild pain.   Yes [provider]  furosemide (LASIX) 20 MG tablet Take 10 mg by mouth daily.   Yes [provider]  HYDROcodone-acetaminophen (NORCO/VICODIN) 5-325 MG tablet Take 2 tablets by mouth every 4 (four) hours as needed. Patient not taking: Reported on 09/18/2017 09/13/17   Wynetta FinesMessick, Peter C, MD    Family History No family history on file.  Social History Social History   Tobacco Use  . Smoking status: Heavy Tobacco Smoker    Packs/day: 1.00  . Smokeless tobacco: Never Used  Substance Use Topics  . Alcohol use: No  . Drug use: No     Allergies   Codeine   Review of Systems Review of Systems  Constitutional: Positive for fever.  HENT: Negative for congestion and sore throat.   Eyes: Negative for redness.  Respiratory: Positive for cough.   Gastrointestinal: Negative for abdominal pain and vomiting.  Genitourinary: Positive for dysuria and frequency. Negative for difficulty urinating, enuresis, flank pain and hematuria.  All other systems reviewed and are negative.    Physical Exam Updated Vital Signs BP (!) 93/42 (BP Location: Right Arm)  Pulse 88   Temp 100 F (37.8 C) (Oral)   Resp 14   Ht 5' 6.5" (1.689 m)   SpO2 95%   Physical Exam  Constitutional: She is oriented to person, place, and time. She appears well-developed and well-nourished. No distress.  Very well appearing  HENT:  Head: Normocephalic and atraumatic.  Mouth/Throat: No oropharyngeal exudate.  Eyes: Conjunctivae are normal. Pupils are equal, round, and reactive to light.  Neck: Normal range of motion. Neck supple.  Cardiovascular: Normal rate, regular rhythm, normal heart sounds and intact distal pulses.  Pulmonary/Chest:  Effort normal and breath sounds normal. No stridor. No respiratory distress. She has no wheezes. She has no rales.  Abdominal: Soft. Bowel sounds are normal. She exhibits no mass. There is no tenderness. There is no rebound and no guarding.  Musculoskeletal: Normal range of motion.  Neurological: She is alert and oriented to person, place, and time. She displays normal reflexes.  Skin: Skin is warm and dry. Capillary refill takes less than 2 seconds.  Psychiatric: She has a normal mood and affect.  Nursing note and vitals reviewed.    ED Treatments / Results  Labs (all labs ordered are listed, but only abnormal results are displayed)  Results for orders placed or performed during the hospital encounter of 09/18/17  Comprehensive metabolic panel  Result Value Ref Range   Sodium 138 135 - 145 mmol/L   Potassium 3.2 (L) 3.5 - 5.1 mmol/L   Chloride 104 101 - 111 mmol/L   CO2 25 22 - 32 mmol/L   Glucose, Bld 94 65 - 99 mg/dL   BUN 16 6 - 20 mg/dL   Creatinine, Ser 1.61 0.44 - 1.00 mg/dL   Calcium 8.0 (L) 8.9 - 10.3 mg/dL   Total Protein 5.9 (L) 6.5 - 8.1 g/dL   Albumin 2.6 (L) 3.5 - 5.0 g/dL   AST 29 15 - 41 U/L   ALT 20 14 - 54 U/L   Alkaline Phosphatase 67 38 - 126 U/L   Total Bilirubin 0.8 0.3 - 1.2 mg/dL   GFR calc non Af Amer >60 >60 mL/min   GFR calc Af Amer >60 >60 mL/min   Anion gap 9 5 - 15  CBC with Differential  Result Value Ref Range   WBC 5.0 4.0 - 10.5 K/uL   RBC 3.39 (L) 3.87 - 5.11 MIL/uL   Hemoglobin 10.1 (L) 12.0 - 15.0 g/dL   HCT 09.6 (L) 04.5 - 40.9 %   MCV 94.1 78.0 - 100.0 fL   MCH 29.8 26.0 - 34.0 pg   MCHC 31.7 30.0 - 36.0 g/dL   RDW 81.1 91.4 - 78.2 %   Platelets 257 150 - 400 K/uL   Neutrophils Relative % 59 %   Neutro Abs 2.9 1.7 - 7.7 K/uL   Lymphocytes Relative 27 %   Lymphs Abs 1.3 0.7 - 4.0 K/uL   Monocytes Relative 11 %   Monocytes Absolute 0.6 0.1 - 1.0 K/uL   Eosinophils Relative 2 %   Eosinophils Absolute 0.1 0.0 - 0.7 K/uL    Basophils Relative 1 %   Basophils Absolute 0.1 0.0 - 0.1 K/uL  Urinalysis, Routine w reflex microscopic  Result Value Ref Range   Color, Urine YELLOW YELLOW   APPearance HAZY (A) CLEAR   Specific Gravity, Urine 1.026 1.005 - 1.030   pH 5.0 5.0 - 8.0   Glucose, UA NEGATIVE NEGATIVE mg/dL   Hgb urine dipstick SMALL (A) NEGATIVE   Bilirubin Urine  NEGATIVE NEGATIVE   Ketones, ur 5 (A) NEGATIVE mg/dL   Protein, ur 30 (A) NEGATIVE mg/dL   Nitrite POSITIVE (A) NEGATIVE   Leukocytes, UA TRACE (A) NEGATIVE   RBC / HPF 6-30 0 - 5 RBC/hpf   WBC, UA 6-30 0 - 5 WBC/hpf   Bacteria, UA MANY (A) NONE SEEN   Squamous Epithelial / LPF 6-30 (A) NONE SEEN   Mucus PRESENT   I-Stat CG4 Lactic Acid, ED  Result Value Ref Range   Lactic Acid, Venous 0.69 0.5 - 1.9 mmol/L   Dg Chest 2 View  Result Date: 09/18/2017 CLINICAL DATA:  Cough and shortness of breath EXAM: CHEST  2 VIEW COMPARISON:  05/07/2012 FINDINGS: Lateral view limited by overlying arm and soft tissues. No focal consolidation or effusion. Mild cardiomegaly. Aortic atherosclerosis. No pneumothorax. Displaced left humeral neck fracture. IMPRESSION: 1. Cardiomegaly without edema or infiltrate 2. Acute displaced proximal left humerus fracture. Electronically Signed   By: Jasmine Pang M.D.   On: 09/18/2017 22:24   Dg Shoulder Left  Result Date: 09/13/2017 CLINICAL DATA:  Status post fall with left shoulder pain. EXAM: LEFT SHOULDER - 2+ VIEW COMPARISON:  None. FINDINGS: There is a mildly comminuted impacted transverse fracture through the surgical neck the left humerus. There is a probable posterior dislocation at the left glenohumeral joint. Increased soft tissue surrounding the humeral head may represent intracapsular hematoma. IMPRESSION: Comminuted impacted left proximal humerus fracture with probable posterior left shoulder dislocation. Electronically Signed   By: Ted Mcalpine M.D.   On: 09/13/2017 13:51   Dg Humerus Left  Result Date:  09/13/2017 CLINICAL DATA:  Acute left shoulder pain following fall today. Initial encounter. EXAM: LEFT HUMERUS - 2+ VIEW COMPARISON:  05/07/2012 chest radiograph FINDINGS: A minimally comminuted left humeral neck fracture is noted with mild shortening. There is equivocal fracture extension into the humeral head. No dislocation. IMPRESSION: Minimally comminuted left humeral neck fracture with mild shortening. Electronically Signed   By: Harmon Pier M.D.   On: 09/13/2017 13:48     Radiology Dg Chest 2 View  Result Date: 09/18/2017 CLINICAL DATA:  Cough and shortness of breath EXAM: CHEST  2 VIEW COMPARISON:  05/07/2012 FINDINGS: Lateral view limited by overlying arm and soft tissues. No focal consolidation or effusion. Mild cardiomegaly. Aortic atherosclerosis. No pneumothorax. Displaced left humeral neck fracture. IMPRESSION: 1. Cardiomegaly without edema or infiltrate 2. Acute displaced proximal left humerus fracture. Electronically Signed   By: Jasmine Pang M.D.   On: 09/18/2017 22:24    Procedures Procedures (including critical care time)  Medications Ordered in ED Medications  cefTRIAXone (ROCEPHIN) injection 1 g (not administered)  acetaminophen (TYLENOL) tablet 1,000 mg (not administered)       Final Clinical Impressions(s) / ED Diagnoses   Viral illness with superimposed UTI.  Will treat UTi with antibiotics.  Follow up with your PMD for recheck.  Stable for discharge with close follow up.    Return for worsening pain, vomiting blood inability to pass urine,  fevers > 100.4 unrelieved by medication, shortness of breath, intractable vomiting, or diarrhea, abdominal pain, Inability to tolerate liquids or food, cough, altered mental status or any concerns. No signs of systemic illness or infection. The patient is nontoxic-appearing on exam and vital signs are within normal limits.    I have reviewed the triage vital signs and the nursing notes. Pertinent labs &imaging results that  were available during my care of the patient were reviewed by me and considered in  my medical decision making (see chart for details).  After history, exam, and medical workup I feel the patient has been appropriately medically screened and is safe for discharge home. Pertinent diagnoses were discussed with the patient. Patient was given return precautions.     Analyse Angst, MD 09/19/17 [phone]

## 2017-09-19 NOTE — ED Notes (Signed)
NT and RN went in to attempt to clean up the pt and assist in changing the soiled sheet. Pt yelling and cussing at staff and fussing at staff for moving her around and getting very agitated.

## 2017-09-19 NOTE — ED Notes (Signed)
When RN went into the room to obtain a urine sample, pt had already soiled herself and had defecated in a pullup. Pt reported she "could not tell" she had went. Strong odor from pt's area and reddened skin noted to area as well.  RN asked pt why she didn't tell the RN earlier that she was incontinent and the pt got very agitated and said she uses her brief to go potty. Pt told RN "Well now I can't go because you fussed at me"

## 2017-09-20 MED FILL — CEPHALEXIN 500 MG CAPSULE: 500 | 7 days supply | Qty: 28 | Fill #0
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
## 2017-09-23 NOTE — Congregational Nurse Program (Signed)
Congregational Nurse Program Note  Date of Encounter: 09/20/2017  Past Medical History: Past Medical History:  Diagnosis Date  . Arthritis   . Hypoglycemia   . Pneumonia, pneumococcal [facility](HCC)     Encounter Details: CNP Questionnaire - 09/20/17 1428      Questionnaire   Patient Status  Not Applicable    Race  White or Caucasian    Location Patient Served At  Not Applicable    Insurance  Medicaid    Uninsured  Not Applicable    Food  Yes, have food insecurities;Within past 12 months, worried food would run out with no money to buy more;Within past 12 months, food ran out with no money to buy more    Housing/Utilities  Yes, have permanent housing    Transportation  Yes, need transportation assistance    Interpersonal Safety  No, do not feel physically and emotionally safe where you currently live    Medication  Yes, have medication insecurities    Medical Provider  No    Referrals  Area Agency    ED Visit Averted  Not Applicable    Life-Saving Intervention Made  Not Applicable      Was seen in the ED on 1/23 with scripts for an antibiotic.  [facility] and delivered to client

## 2017-09-30 ENCOUNTER — Encounter (HOSPITAL_COMMUNITY): Payer: Self-pay | Admitting: Family Medicine

## 2017-09-30 DIAGNOSIS — Z5321 Procedure and treatment not carried out due to patient leaving prior to being seen by health care provider: Secondary | ICD-10-CM | POA: Diagnosis not present

## 2017-09-30 DIAGNOSIS — R2243 Localized swelling, mass and lump, lower limb, bilateral: Secondary | ICD-10-CM | POA: Insufficient documentation

## 2017-09-30 NOTE — ED Triage Notes (Signed)
[facility] Patient is complaining of bilateral leg pain and lower extremity edema. Symptoms got worse about a month ago. Patient is suppose to be taking Lasix but she had not had a dose recently. Patient is alert, oriented x 4, and appears in no acute distress.

## 2017-10-01 ENCOUNTER — Emergency Department (HOSPITAL_COMMUNITY)
Admission: EM | Admit: 2017-10-01 | Discharge: 2017-10-01 | Disposition: A | Discharge status: Home / Self Care | Payer: Medicare Other | Attending: Emergency Medicine | Admitting: Emergency Medicine

## 2017-10-01 NOTE — ED Notes (Signed)
Pt stated that she was leaving the hospital.

## 2017-10-01 NOTE — ED Notes (Signed)
Pt did not answer when called

## 2017-10-06 ENCOUNTER — Encounter (HOSPITAL_COMMUNITY): Payer: Self-pay | Admitting: *Deleted

## 2017-10-06 ENCOUNTER — Other Ambulatory Visit: Payer: Self-pay

## 2017-10-06 ENCOUNTER — Emergency Department (HOSPITAL_COMMUNITY)
Admission: EM | Admit: 2017-10-06 | Discharge: 2017-10-06 | Disposition: A | Discharge status: Home / Self Care | Payer: Medicare Other | Attending: Emergency Medicine | Admitting: Emergency Medicine

## 2017-10-06 DIAGNOSIS — G8921 Chronic pain due to trauma: Secondary | ICD-10-CM | POA: Insufficient documentation

## 2017-10-06 DIAGNOSIS — Z5900 Homelessness unspecified: Secondary | ICD-10-CM

## 2017-10-06 DIAGNOSIS — F17210 Nicotine dependence, cigarettes, uncomplicated: Secondary | ICD-10-CM | POA: Insufficient documentation

## 2017-10-06 DIAGNOSIS — M25512 Pain in left shoulder: Secondary | ICD-10-CM | POA: Diagnosis present

## 2017-10-06 MED ORDER — ACETAMINOPHEN 500 MG PO TABS
1000.0000 mg | ORAL_TABLET | Freq: Once | ORAL | Status: AC
  Administered 2017-10-06: 1000 mg via ORAL
  Filled 2017-10-06: qty 2

## 2017-10-06 NOTE — ED Provider Notes (Signed)
TIME SEEN: 4:34 AM  CHIEF COMPLAINT: Left shoulder pain; "I needed somewhere to get warm"  HPI: Patient is a right-hand-dominant 67 year old female with history of homelessness who presents to the emergency department requesting somewhere to stay to get warm.  She currently does not have anywhere to live.  States she is also felt very tired and needed somewhere to sleep.  Her only complaint is left shoulder pain.  She had a fall in the middle of January and was seen here and had a fracture on September 13, 2017.  Is currently in a sling.  No new injury.  Normal sensation throughout this arm.  Normal grip strength.  No other medical complaints.  ROS: See HPI Constitutional: no fever  Eyes: no drainage  ENT: no runny nose   Cardiovascular:  no chest pain  Resp: no SOB  GI: no vomiting GU: no dysuria Integumentary: no rash  Allergy: no hives  Musculoskeletal: no leg swelling  Neurological: no slurred speech ROS otherwise negative  PAST MEDICAL HISTORY/PAST SURGICAL HISTORY:  Past Medical History:  Diagnosis Date  . Arthritis   . Hypoglycemia   . Pneumonia, pneumococcal (HCC)     MEDICATIONS:  Prior to Admission medications   Medication Sig Start Date End Date Taking? Authorizing Provider  acetaminophen (TYLENOL) 500 MG tablet Take 1,000 mg by mouth every 6 (six) hours as needed for mild pain.    [provider]  cephALEXin (KEFLEX) 500 MG capsule Take 1 capsule (500 mg total) by mouth 4 (four) times daily. 09/19/17   Palumbo, April, MD  furosemide (LASIX) 20 MG tablet Take 10 mg by mouth daily.    [provider]  HYDROcodone-acetaminophen (NORCO/VICODIN) 5-325 MG tablet Take 2 tablets by mouth every 4 (four) hours as needed. Patient not taking: Reported on 09/18/2017 09/13/17   Wynetta FinesMessick, Peter C, MD    ALLERGIES:  Allergies  Allergen Reactions  . Codeine Other (See Comments)    "Gets high"    SOCIAL HISTORY:  Social History   Tobacco Use  . Smoking status:  Heavy Tobacco Smoker    Packs/day: 1.00  . Smokeless tobacco: Never Used  Substance Use Topics  . Alcohol use: No    FAMILY HISTORY: No family history on file.  EXAM: BP 118/62 (BP Location: Right Arm)   Pulse 96   Temp 98.1 F (36.7 C) (Oral)   Resp 16   Ht 5\' 5"  (1.651 m)   SpO2 98%  CONSTITUTIONAL: Alert and oriented and responds appropriately to questions.  Chronically ill-appearing.  Appears older than stated age.  Resting comfortably. HEAD: Normocephalic EYES: Conjunctivae clear, pupils appear equal, EOMI ENT: normal nose; moist mucous membranes NECK: Supple, no meningismus, no nuchal rigidity, no LAD  CARD: RRR; S1 and S2 appreciated; no murmurs, no clicks, no rubs, no gallops RESP: Normal chest excursion without splinting or tachypnea; breath sounds clear and equal bilaterally; no wheezes, no rhonchi, no rales, no hypoxia or respiratory distress, speaking full sentences ABD/GI: Normal bowel sounds; non-distended; soft, non-tender, no rebound, no guarding, no peritoneal signs, no hepatosplenomegaly BACK:  The back appears normal and is non-tender to palpation, there is no CVA tenderness EXT: Tender diffusely over the proximal left humerus.  Patient is in a sling.  Normal grip strength bilaterally and 2+ radial pulses bilaterally.  Decreased range of motion in this joint secondary to pain.  Otherwise normal ROM in all joints; otherwise extremities are non-tender to palpation; no edema; normal capillary refill; no cyanosis, no calf  tenderness or swelling    SKIN: Normal color for age and race; warm; no rash NEURO: Moves all extremities equally PSYCH: The patient's mood and manner are appropriate. Grooming and personal hygiene are appropriate.  MEDICAL DECISION MAKING: Patient here requesting somewhere to sleep and get warm.  She is currently homeless.  Complaining of chronic left shoulder pain after injury January 18.  X-rays obtained at that time showed a left proximal humeral  neck fracture.  She is in a sling.  No new injury.  Neurovascular intact distally.  I do not feel she needs repeat imaging.  Will give her Tylenol for her discomfort and provide her with outpatient resources.  At this time, I do not feel there is any life-threatening condition present. I have reviewed and discussed all results (EKG, imaging, lab, urine as appropriate) and exam findings with patient/family. I have reviewed nursing notes and appropriate previous records.  I feel the patient is safe to be discharged home without further emergent workup and can continue workup as an outpatient as needed. Discussed usual and customary return precautions. Patient/family verbalize understanding and are comfortable with this plan.  Outpatient follow-up has been provided if needed. All questions have been answered.      Emery Dupuy, Layla Maw, DO 10/06/17 [phone]

## 2017-10-06 NOTE — ED Triage Notes (Signed)
Pt stated "I'm supposed to be getting an apartment soon.  [place] to get warm."  Pt also c/o left shoulder pain.

## 2017-10-06 NOTE — Discharge Instructions (Signed)
You may alternate Tylenol 1000 mg every 6 hours as needed for pain and Ibuprofen 800 mg every 8 hours as needed for pain.  Please take Ibuprofen with food. ° ° ° °To find a primary care or specialty doctor please call [phone] or [phone] to access "[place] Find a Doctor Service." ° °You may also go on the [place] website at [email or web address] ° °There are also multiple [facility], [facility] practices throughout the [place] that are frequently accepting new patients. You may find a clinic that is close to your home and contact them. ° °[facility] -  °[address] °[place] [address] °[phone] ° ° °[facility] -  °[address] °[place] [address] °[phone] ° ° °[facility] - °[address]  °[place] [address] °[phone] ° ° °

## 2017-10-06 NOTE — ED Notes (Signed)
Called for Pt. x2 .

## 2017-12-10 ENCOUNTER — Encounter (HOSPITAL_COMMUNITY): Payer: Self-pay | Admitting: Emergency Medicine

## 2017-12-10 ENCOUNTER — Emergency Department (HOSPITAL_COMMUNITY)
Admission: EM | Admit: 2017-12-10 | Discharge: 2017-12-10 | Disposition: A | Discharge status: Home / Self Care | Payer: Medicare Other | Attending: Emergency Medicine | Admitting: Emergency Medicine

## 2017-12-10 DIAGNOSIS — R609 Edema, unspecified: Secondary | ICD-10-CM

## 2017-12-10 DIAGNOSIS — Z79899 Other long term (current) drug therapy: Secondary | ICD-10-CM | POA: Diagnosis not present

## 2017-12-10 DIAGNOSIS — I83019 Varicose veins of right lower extremity with ulcer of unspecified site: Secondary | ICD-10-CM

## 2017-12-10 DIAGNOSIS — I872 Venous insufficiency (chronic) (peripheral): Secondary | ICD-10-CM

## 2017-12-10 DIAGNOSIS — F17210 Nicotine dependence, cigarettes, uncomplicated: Secondary | ICD-10-CM | POA: Diagnosis not present

## 2017-12-10 DIAGNOSIS — R2243 Localized swelling, mass and lump, lower limb, bilateral: Secondary | ICD-10-CM | POA: Insufficient documentation

## 2017-12-10 DIAGNOSIS — L97929 Non-pressure chronic ulcer of unspecified part of left lower leg with unspecified severity: Secondary | ICD-10-CM

## 2017-12-10 DIAGNOSIS — L97919 Non-pressure chronic ulcer of unspecified part of right lower leg with unspecified severity: Secondary | ICD-10-CM

## 2017-12-10 DIAGNOSIS — I83029 Varicose veins of left lower extremity with ulcer of unspecified site: Secondary | ICD-10-CM

## 2017-12-10 LAB — CBC WITH DIFFERENTIAL/PLATELET
Basophils Absolute: 0 10*3/uL (ref 0.0–0.1)
Basophils Relative: 0 %
EOS ABS: 0.1 10*3/uL (ref 0.0–0.7)
EOS PCT: 2 %
HCT: 36.2 % (ref 36.0–46.0)
HEMOGLOBIN: 11.3 g/dL — AB (ref 12.0–15.0)
Lymphocytes Relative: 20 %
Lymphs Abs: 1.5 10*3/uL (ref 0.7–4.0)
MCH: 29.4 pg (ref 26.0–34.0)
MCHC: 31.2 g/dL (ref 30.0–36.0)
MCV: 94.3 fL (ref 78.0–100.0)
MONO ABS: 0.5 10*3/uL (ref 0.1–1.0)
MONOS PCT: 6 %
Neutro Abs: 5.6 10*3/uL (ref 1.7–7.7)
Neutrophils Relative %: 72 %
PLATELETS: 231 10*3/uL (ref 150–400)
RBC: 3.84 MIL/uL — ABNORMAL LOW (ref 3.87–5.11)
RDW: 14.1 % (ref 11.5–15.5)
WBC: 7.8 10*3/uL (ref 4.0–10.5)

## 2017-12-10 LAB — BASIC METABOLIC PANEL
Anion gap: 10 (ref 5–15)
BUN: 19 mg/dL (ref 6–20)
CALCIUM: 9.1 mg/dL (ref 8.9–10.3)
CHLORIDE: 105 mmol/L (ref 101–111)
CO2: 23 mmol/L (ref 22–32)
CREATININE: 0.99 mg/dL (ref 0.44–1.00)
GFR calc non Af Amer: 58 mL/min — ABNORMAL LOW (ref >60)
GLUCOSE: 121 mg/dL — AB (ref 65–99)
Potassium: 4.1 mmol/L (ref 3.5–5.1)
Sodium: 138 mmol/L (ref 135–145)

## 2017-12-10 LAB — BRAIN NATRIURETIC PEPTIDE: B NATRIURETIC PEPTIDE 5: 231.2 pg/mL — AB (ref 0.0–100.0)

## 2017-12-10 MED ORDER — FUROSEMIDE 20 MG PO TABS: 10.0000 mg | ORAL_TABLET | Freq: Every day | ORAL | 0 refills | Status: DC

## 2017-12-10 MED ORDER — ALUM & MAG HYDROXIDE-SIMETH 200-200-20 MG/5ML PO SUSP
15.0000 mL | Freq: Once | ORAL | Status: AC
  Administered 2017-12-10: 15 mL via ORAL
  Filled 2017-12-10: qty 30

## 2017-12-10 MED ORDER — ACETAMINOPHEN 325 MG PO TABS
650.0000 mg | ORAL_TABLET | Freq: Once | ORAL | Status: AC
  Administered 2017-12-10: 650 mg via ORAL
  Filled 2017-12-10: qty 2

## 2017-12-10 NOTE — ED Notes (Signed)
Patient given discharge instructions and verbalized understanding.  Patient stable to discharge at this time.  Patient is alert and oriented to baseline.  No distressed noted at this time.  All belongings taken with the patient at discharge.   

## 2017-12-10 NOTE — ED Triage Notes (Signed)
PT has open weeping wounds to bilateral lower legs for 3 months. PT believes it is secondary to leg swelling. PT takes no meds and has not seen her PCP in "ages." PT reports she was admitted to the hospital in February for a broken arm and discharged with prescriptions for lasix and potassium "but she never received it." PT states, "They never told me I had to go get anything."

## 2017-12-10 NOTE — Discharge Planning (Signed)
EDCM to obtain PCP for pt.  [facility] to obtain new pt packet.  Will fax packet to office when pt has completed it to obtain appointment.

## 2017-12-10 NOTE — ED Notes (Signed)
PT taken to shower to clean lower legs

## 2017-12-10 NOTE — Discharge Planning (Signed)
Tonnya Garbett J. Lucretia RoersWood, RN, BSN, [employer] [phone] Spoke with pt at bedside regarding discharge planning for [facility]. Offered pt list of home health agencies to choose from.  Pt chose [facility] to render services. Eugenio HoesEllen Williams, RN of [facility] notified. Patient made aware that [facility] will be in contact in 24-48 hours.  No DME needs identified at this time.

## 2017-12-10 NOTE — ED Notes (Signed)
Ortho tech paged to [employer]aitlyn[employer]'s phone for bilateral unna boot

## 2017-12-10 NOTE — ED Provider Notes (Signed)
5:52 PM Pt signed out to me at shift change. Pt with LE lesions and swelling. Pt was treated in ED, wound care consulted. Social work consulted. Has apt with PCP on 01/02/18 with family practice. Pt signed out to me pending labs.   Results for orders placed or performed during the hospital encounter of 12/10/17  Basic metabolic panel  Result Value Ref Range   Sodium 138 135 - 145 mmol/L   Potassium 4.1 3.5 - 5.1 mmol/L   Chloride 105 101 - 111 mmol/L   CO2 23 22 - 32 mmol/L   Glucose, Bld 121 (H) 65 - 99 mg/dL   BUN 19 6 - 20 mg/dL   Creatinine, Ser 7.820.99 0.44 - 1.00 mg/dL   Calcium 9.1 8.9 - 95.610.3 mg/dL   GFR calc non Af Amer 58 (L) >60 mL/min   GFR calc Af Amer >60 >60 mL/min   Anion gap 10 5 - 15  CBC with Differential  Result Value Ref Range   WBC 7.8 4.0 - 10.5 K/uL   RBC 3.84 (L) 3.87 - 5.11 MIL/uL   Hemoglobin 11.3 (L) 12.0 - 15.0 g/dL   HCT 21.336.2 08.636.0 - 57.846.0 %   MCV 94.3 78.0 - 100.0 fL   MCH 29.4 26.0 - 34.0 pg   MCHC 31.2 30.0 - 36.0 g/dL   RDW 46.914.1 62.911.5 - 52.815.5 %   Platelets 231 150 - 400 K/uL   Neutrophils Relative % 72 %   Neutro Abs 5.6 1.7 - 7.7 K/uL   Lymphocytes Relative 20 %   Lymphs Abs 1.5 0.7 - 4.0 K/uL   Monocytes Relative 6 %   Monocytes Absolute 0.5 0.1 - 1.0 K/uL   Eosinophils Relative 2 %   Eosinophils Absolute 0.1 0.0 - 0.7 K/uL   Basophils Relative 0 %   Basophils Absolute 0.0 0.0 - 0.1 K/uL  Brain natriuretic peptide  Result Value Ref Range   B Natriuretic Peptide 231.2 (H) 0.0 - 100.0 pg/mL   No results found.    Labs with no significant abnormalities. BNP slightly up at 231. Hgb slightly low at 11.3. Will need to follow up outpatient. VS normal. Stable for dc home.   Vitals:   12/10/17 1008 12/10/17 1009 12/10/17 1723  BP: 118/61  127/79  Pulse: 87  86  Resp: 16  18  Temp: 98.2 F (36.8 C)    TempSrc: Oral    SpO2: 99%  98%  Weight:  79.4 kg (175 lb)       Jaynie CrumbleKirichenko, Tait Balistreri, PA-C 12/10/17 1754    Margarita Grizzleay, Danielle, MD 12/12/17  [phone]

## 2017-12-10 NOTE — ED Provider Notes (Signed)
Medical screening examination/treatment/procedure(s) were conducted as a shared visit with non-physician practitioner(s) and myself.  I personally evaluated the patient during the encounter.  Patient presents to the emergency room for evaluation of persistent leg swelling.  Patient states the symptoms started several months ago.  Patient was last in the hospital and February and was supposed to be taking Lasix and potassium but she never filled any of those prescriptions.  On exam the patient does appear to have chronic lower extremity edema.  Some skin changes consistent with chronic venous stasis ulceration.  Patient's extremities are also very dirty and unkempt.  I suspect she has some difficulty with hygiene based on her social situation.  This is likely contributing to her skin condition.   Linwood DibblesKnapp, [name], MD 12/10/17 1125

## 2017-12-10 NOTE — Discharge Instructions (Addendum)
Get help right away if: You get an injury and an open wound in the affected area. You have severe pain that does not get better with medicine. You have sudden numbness or weakness in the foot or ankle below the affected area, or you have trouble moving your foot or ankle. You have a fever and you have worse or persistent symptoms. You have chest pain. You have shortness of breath.

## 2017-12-10 NOTE — Progress Notes (Signed)
Orthopedic Tech Progress Note Patient Details:  [name] 09/21/1950 [id number]  Ortho Devices Type of Ortho Device: Ace wrap, Unna boot Ortho Device/Splint Location: Bilateral unna boots Ortho Device/Splint Interventions: Application   Post Interventions Patient Tolerated: Well Instructions Provided: Care of device   Saul FordyceJennifer C [name] 12/10/2017, 4:13 PM

## 2017-12-10 NOTE — Consult Note (Signed)
WOC Nurse wound consult note Reason for Consult: bilateral weeping areas on lower legs consistent with venous stasis Wound type: Venous stasis The patient has circumferential crusted, weeping areas to bilateral lower legs that make complete wound assessment impossible until the legs have been washed.  The patient states the wounds have been present for 3 months.  She states she has been "thinking about" having her legs wrapped, but to date, has never had this done.  The nurse tech is in the process of taking the patient to the shower to wash her legs and feet.  Based on the general appearance of her legs, I recommend the following:  Post shower, wrap the calves of the legs with Xeroform guaze, cover with multiple foam dressings.  Then contact the ortho tech for application of unna boots.  These compression wraps will need to be changed no less often than weekly, and it may be helpful to have them changed in 4 days to evaluate the patient's response to treatment.Thank you for the consult.  Discussed plan of care with the patient and bedside nurse.  WOC nurse will not follow at this time.  Please re-consult the WOC team if needed.  Helmut MusterSherry Cylas Falzone, RN, MSN, CWOCN, CNS-BC, pager [phone]

## 2017-12-10 NOTE — ED Notes (Signed)
Petroleum gauze applied to both legs, then covered with allevyn dressing. Ortho to aply unna boots

## 2017-12-10 NOTE — ED Provider Notes (Signed)
[facility] EMERGENCY DEPARTMENT Provider Note   CSN: [id number] Arrival date & time: 12/10/17  1000     History   Chief Complaint Chief Complaint  Patient presents with  . Recurrent Skin Infections    HPI [name] is a 67 y.o. female who presents the emergency department chief complaint of bilateral leg swelling and infection.  Patient has a history of homeless and is currently in a Medicaid/Medicare funded social living facility.  She does not know if she has a primary care doctor.  She was seen 4 months ago for the same.  She complains of chronic weeping, discharge from the legs, pain and burning.  She has difficulty maintaining good hygiene.  She has poor insight into self care and medical conditions.  She was unaware that she had to go to a pharmacy to get Lasix and potassium at her previous discharge.  She denies fevers or chills.  She has a history of heavy tobacco abuse.  HPI  Past Medical History:  Diagnosis Date  . Arthritis   . Hypoglycemia   . Pneumonia, pneumococcal (HCC)     There are no active problems to display for this patient.   Past Surgical History:  Procedure Laterality Date  . ABDOMINAL HYSTERECTOMY    . CESAREAN SECTION       OB History    Gravida      Para      Term      Preterm      AB      Living  3     SAB      TAB      Ectopic      Multiple      Live Births               Home Medications    Prior to Admission medications   Medication Sig Start Date End Date Taking? Authorizing Provider  acetaminophen (TYLENOL) 500 MG tablet Take 1,000 mg by mouth every 6 (six) hours as needed for mild pain.    [provider]  cephALEXin (KEFLEX) 500 MG capsule Take 1 capsule (500 mg total) by mouth 4 (four) times daily. 09/19/17   Palumbo, April, MD  furosemide (LASIX) 20 MG tablet Take 10 mg by mouth daily.    [provider]  HYDROcodone-acetaminophen (NORCO/VICODIN) 5-325 MG tablet Take 2  tablets by mouth every 4 (four) hours as needed. Patient not taking: Reported on 09/18/2017 09/13/17   Wynetta Fines, MD    Family History No family history on file.  Social History Social History   Tobacco Use  . Smoking status: Heavy Tobacco Smoker    Packs/day: 1.00  . Smokeless tobacco: Never Used  Substance Use Topics  . Alcohol use: No  . Drug use: No     Allergies   Codeine   Review of Systems Review of Systems  Ten systems reviewed and are negative for acute change, except as noted in the HPI.   Physical Exam Updated Vital Signs BP 118/61   Pulse 87   Temp 98.2 F (36.8 C) (Oral)   Resp 16   Wt 79.4 kg (175 lb)   SpO2 99%   BMI 29.12 kg/m   Physical Exam  Constitutional: She is oriented to person, place, and time. She appears well-developed and well-nourished. No distress.  HENT:  Head: Normocephalic and atraumatic.  Eyes: Conjunctivae are normal. No scleral icterus.  Neck: Normal range of motion.  Cardiovascular:  Normal rate, regular rhythm and normal heart sounds. Exam reveals no gallop and no friction rub.  No murmur heard. Pulmonary/Chest: Effort normal and breath sounds normal. No respiratory distress.  Abdominal: Soft. Bowel sounds are normal. She exhibits no distension and no mass. There is no tenderness. There is no guarding.  Musculoskeletal: She exhibits edema.  Neurological: She is alert and oriented to person, place, and time.  Skin: She is not diaphoretic.  Lateral lower extremities with weeping, crusting, macerated swollen tissue, erythema.  The feet are covered in crusted dark dirty material and hair with foul odor.  Psychiatric: Her behavior is normal.  Nursing note and vitals reviewed.    ED Treatments / Results  Labs (all labs ordered are listed, but only abnormal results are displayed) Labs Reviewed - No data to display  EKG None  Radiology No results found.  Procedures Procedures (including critical care  time)  Medications Ordered in ED Medications  alum & mag hydroxide-simeth (MAALOX/MYLANTA) 200-200-20 MG/5ML suspension 15 mL (has no administration in time range)     Initial Impression / Assessment and Plan / ED Course  I have reviewed the triage vital signs and the nursing notes.  Pertinent labs & imaging results that were available during my care of the patient were reviewed by me and considered in my medical decision making (see chart for details).     Patient with bilateral lower extremity edema, cleaned and dressed here evaluated by the wound care specialist nurse in the ER.  No evidence of cellulitis at this time however she does have some venous stasis ulcers and chronic peripheral edema.  Her legs were extremely filthy at arrival and cleaned thoroughly.  Patient placed in Unna boots.  She has follow-up appointment with family medicine as well as home health wound care.  We are currently awaiting the patient's blood work.  I have given sign out to PA Kirichenko who was assumed care of the patient for appropriate disposition.  Final Clinical Impressions(s) / ED Diagnoses   Final diagnoses:  None    ED Discharge Orders    None       Arthor CaptainHarris, Valincia Touch, PA-C 12/10/17 1548    Margarita Grizzleay, Danielle, MD 12/12/17 [phone]

## 2017-12-17 ENCOUNTER — Telehealth: Payer: Self-pay

## 2017-12-17 NOTE — Telephone Encounter (Signed)
[facility] for [facility]  1x week for 1 week 3x week for 8 weeks 2 PRN visits Her call back for VO [phone] Pt has appt with Dr. Chanetta Marshallimberlake 01/02/18 Shawna OrleansMeredith B Jaspreet Hollings, RN

## 2017-12-18 ENCOUNTER — Encounter (HOSPITAL_COMMUNITY): Payer: Self-pay | Admitting: *Deleted

## 2017-12-18 ENCOUNTER — Other Ambulatory Visit: Payer: Self-pay

## 2017-12-18 ENCOUNTER — Emergency Department (HOSPITAL_COMMUNITY)
Admission: EM | Admit: 2017-12-18 | Discharge: 2017-12-18 | Disposition: A | Discharge status: Home / Self Care | Payer: Medicare Other | Attending: Emergency Medicine | Admitting: Emergency Medicine

## 2017-12-18 DIAGNOSIS — F17200 Nicotine dependence, unspecified, uncomplicated: Secondary | ICD-10-CM | POA: Diagnosis not present

## 2017-12-18 DIAGNOSIS — R609 Edema, unspecified: Secondary | ICD-10-CM | POA: Diagnosis present

## 2017-12-18 DIAGNOSIS — I878 Other specified disorders of veins: Secondary | ICD-10-CM | POA: Insufficient documentation

## 2017-12-18 LAB — CBC WITH DIFFERENTIAL/PLATELET
BASOS ABS: 0 10*3/uL (ref 0.0–0.1)
BASOS PCT: 1 %
Eosinophils Absolute: 0.3 10*3/uL (ref 0.0–0.7)
Eosinophils Relative: 6 %
HEMATOCRIT: 33.8 % — AB (ref 36.0–46.0)
HEMOGLOBIN: 10.4 g/dL — AB (ref 12.0–15.0)
LYMPHS PCT: 26 %
Lymphs Abs: 1.6 10*3/uL (ref 0.7–4.0)
MCH: 29.1 pg (ref 26.0–34.0)
MCHC: 30.8 g/dL (ref 30.0–36.0)
MCV: 94.7 fL (ref 78.0–100.0)
MONO ABS: 0.6 10*3/uL (ref 0.1–1.0)
Monocytes Relative: 11 %
NEUTROS ABS: 3.5 10*3/uL (ref 1.7–7.7)
NEUTROS PCT: 58 %
Platelets: 262 10*3/uL (ref 150–400)
RBC: 3.57 MIL/uL — AB (ref 3.87–5.11)
RDW: 14 % (ref 11.5–15.5)
WBC: 6 10*3/uL (ref 4.0–10.5)

## 2017-12-18 LAB — COMPREHENSIVE METABOLIC PANEL
ALBUMIN: 3.1 g/dL — AB (ref 3.5–5.0)
ALK PHOS: 83 U/L (ref 38–126)
ALT: 13 U/L — ABNORMAL LOW (ref 14–54)
ANION GAP: 9 (ref 5–15)
AST: 18 U/L (ref 15–41)
BILIRUBIN TOTAL: 0.4 mg/dL (ref 0.3–1.2)
BUN: 18 mg/dL (ref 6–20)
CALCIUM: 9 mg/dL (ref 8.9–10.3)
CO2: 24 mmol/L (ref 22–32)
Chloride: 105 mmol/L (ref 101–111)
Creatinine, Ser: 1.03 mg/dL — ABNORMAL HIGH (ref 0.44–1.00)
GFR calc non Af Amer: 55 mL/min — ABNORMAL LOW (ref >60)
GLUCOSE: 93 mg/dL (ref 65–99)
Potassium: 3.9 mmol/L (ref 3.5–5.1)
SODIUM: 138 mmol/L (ref 135–145)
TOTAL PROTEIN: 6.9 g/dL (ref 6.5–8.1)

## 2017-12-18 MED ORDER — IBUPROFEN 400 MG PO TABS
400.0000 mg | ORAL_TABLET | Freq: Once | ORAL | Status: AC
  Administered 2017-12-18: 400 mg via ORAL
  Filled 2017-12-18: qty 1

## 2017-12-18 NOTE — ED Notes (Signed)
ED Provider at bedside. 

## 2017-12-18 NOTE — Progress Notes (Signed)
CSW spoke with representative from [facility], Eugenio Hoesllen Williams. [facility] is providing Home Health care needs to the pt and are providing wound dressing changes.   Montine CircleKelsy Barbarita Hutmacher, Silverio LayLCSWA [facility]  [phone]

## 2017-12-18 NOTE — ED Triage Notes (Addendum)
Pt told to call 911 by her home healthcare worker b/c the RN could not make it to the house to change the dressings.  Vs  118/66, hr 110, rr 16.  Last bandage was changed last week.  Foul odor noted.

## 2017-12-18 NOTE — Progress Notes (Signed)
[facility] notified by ED Unit Secretary that patient is here by EMS to have dressing changed patient is active with [facility] for wound care services. CM met with patient at bedside to discuss  [facility] services and a\the appropriate usage of Emergency  Services, patient  Verbalized understanding. CM spoke with Martie [facility] [place] services, who states  She would needs an wound care order in order to continue services.  HH order was placed and sent via Epic CHL. Also discussed with patient she may benefit from [facility] evaluation patient is agreeable referral placed, and patient instructed to contact the wound clinic in the am. CM update EDP  patient will  need to have unna boot dressing change prior to discharge from the ED. Order placed for dsg change.

## 2017-12-18 NOTE — Discharge Instructions (Addendum)
Dressings should be changed by home health aide or wound nurse.  Orders have been placed today to help you establish care with wound care center.   Return for worsening swelling, pain, warmth, fevers, chills.

## 2017-12-18 NOTE — ED Provider Notes (Signed)
[facility] Provider Note   CSN: [id number] Arrival date & time: 12/18/17  1837     History   Chief Complaint Chief Complaint  Patient presents with  . Leg Swelling    bandage changes    HPI [name] is a 67 y.o. female with chronic peripheral edema and venous stasis here for dressing changes for chronic wounds to bilateral legs.  HH worker was unable to go to patient's home to change dressings today and pt was advised to call 911.  Last bandage change was 1 week ago.  Patient reports burning, intermittent, moderate pain to anterior RLE. States this area has the biggest wound that is slower to heal.  Pain has been about the same for several weeks, not worse.  She endorses ongoing smell, unchanged.  Swelling to LE actually improved.  She denies fevers, chills. Does not offer other complaints and is eager to get dressings changed so she can go home.  Aggravating factors: palpation during dressing changes. Alleviating factors: motrin.    HPI  Past Medical History:  Diagnosis Date  . Arthritis   . Hypoglycemia   . Pneumonia, pneumococcal (HCC)     There are no active problems to display for this patient.   Past Surgical History:  Procedure Laterality Date  . ABDOMINAL HYSTERECTOMY    . CESAREAN SECTION       OB History    Gravida      Para      Term      Preterm      AB      Living  3     SAB      TAB      Ectopic      Multiple      Live Births               Home Medications    Prior to Admission medications   Medication Sig Start Date End Date Taking? Authorizing Provider  furosemide (LASIX) 20 MG tablet Take 0.5 tablets (10 mg total) by mouth daily. Patient taking differently: Take 20 mg by mouth daily.  12/10/17  Yes Harris, Abigail, PA-C  ibuprofen (ADVIL,MOTRIN) 200 MG tablet Take 400 mg by mouth as needed.   Yes [provider]  Multiple Vitamins-Minerals (CENTRUM SILVER 50+WOMEN PO) Take  1 tablet by mouth daily.   Yes [provider]    Family History No family history on file.  Social History Social History   Tobacco Use  . Smoking status: Heavy Tobacco Smoker    Packs/day: 0.50  . Smokeless tobacco: Never Used  Substance Use Topics  . Alcohol use: No  . Drug use: No     Allergies   Codeine   Review of Systems Review of Systems  Cardiovascular: Positive for leg swelling.  Skin: Positive for wound.  All other systems reviewed and are negative.    Physical Exam Updated Vital Signs BP (!) 145/80   Pulse (!) 105   Temp 98.5 F (36.9 C) (Oral)   Resp 18   Ht 5' 6.5" (1.689 m)   Wt 74.8 kg (165 lb)   SpO2 100%   BMI 26.23 kg/m   Physical Exam  Constitutional: She is oriented to person, place, and time. She appears well-developed and well-nourished. No distress.  NAD.  HENT:  Head: Normocephalic and atraumatic.  Right Ear: External ear normal.  Left Ear: External ear normal.  Nose: Nose normal.  Eyes: Conjunctivae  and EOM are normal. No scleral icterus.  Neck: Normal range of motion. Neck supple.  Cardiovascular: Normal rate, regular rhythm and normal heart sounds. Exam reveals decreased pulses.  Pulses:      Dorsalis pedis pulses are 1+ on the right side, and 1+ on the left side.       Posterior tibial pulses are 1+ on the right side, and 1+ on the left side.  Diminished but palpable DP and PT pulses bilaterally. 2+ pitting edema to mid tib/fib, symmetric. No calf tenderness.   Pulmonary/Chest: Effort normal and breath sounds normal.  Normal WOB. No wheezing or crackles.   Musculoskeletal: Normal range of motion. She exhibits no deformity.  Full ROM of lower extremities without pain, no focal joint edema, warmth, fluctuance or tenderness.   Feet:  Right Foot:  Skin Integrity: Positive for ulcer, skin breakdown and erythema.  Left Foot:  Skin Integrity: Positive for ulcer, skin breakdown and erythema.  Neurological: She is alert  and oriented to person, place, and time.  Skin: Skin is warm and dry. Capillary refill takes less than 2 seconds.  See picture. Diffuse skin macerated skin break down bilaterally with chronic appearing venous stasis ulcers, largest at right mid tib/fib.  Foul odor.  No purulence. Lower extremities are unkept with dirt to toes, toenails and throughout lower extremities and on wounds.    Psychiatric: She has a normal mood and affect. Her behavior is normal. Judgment and thought content normal.  Nursing note and vitals reviewed.            ED Treatments / Results  Labs (all labs ordered are listed, but only abnormal results are displayed) Labs Reviewed  COMPREHENSIVE METABOLIC PANEL - Abnormal; Notable for the following components:      Result Value   Creatinine, Ser 1.03 (*)    Albumin 3.1 (*)    ALT 13 (*)    GFR calc non Af Amer 55 (*)    All other components within normal limits  CBC WITH DIFFERENTIAL/PLATELET - Abnormal; Notable for the following components:   RBC 3.57 (*)    Hemoglobin 10.4 (*)    HCT 33.8 (*)    All other components within normal limits    EKG None  Radiology No results found.  Procedures Procedures (including critical care time)  Medications Ordered in ED Medications  ibuprofen (ADVIL,MOTRIN) tablet 400 mg (400 mg Oral Given 12/18/17 2054)     Initial Impression / Assessment and Plan / ED Course  I have reviewed the triage vital signs and the nursing notes.  Pertinent labs & imaging results that were available during my care of the patient were reviewed by me and considered in my medical decision making (see chart for details).     67 yo F here for dressing changes, HH worker unable to go to her home to do it today. Symptoms are chronic, unchanged. No fevers.  Exam as above shows chronic venous stasis ulcerations.  One is largest and tender, but pt states it actually looks better than before.  There is foul odor that is also not new.   Afebrile today without elevation in WBC.  Wounds thoroughly cleaned and dressings changed with unna boots bilaterally.  Face to face encounter and [facility] RN order placed twice weekly to visit patient. CM assisted with establishing care with wound care center as well.  Pt deemed appropriate for dc at this time. Given that pt reports improvement in overall pain and appearance of wounds  not further emergent lab work/imaging indicated today.  HH worker scheduled to visit patient tomorrow and Friday as well. Discussed return precautions. Pt agreeable and eager to go home.    Final Clinical Impressions(s) / ED Diagnoses   Final diagnoses:  Peripheral edema  Venous stasis of both lower extremities    ED Discharge Orders    None       Jerrell Mylar 12/19/17 1437    Benjiman Core, MD 12/20/17 0009

## 2017-12-18 NOTE — ED Notes (Signed)
BLE dressed and wrapped with Kerlex per provider

## 2017-12-18 NOTE — ED Notes (Signed)
Pt received discharge instructions. Pt has buss pass. Pt denied any further requests.

## 2017-12-18 NOTE — ED Notes (Signed)
Pt ambulated to bathroom without difficulty.

## 2017-12-18 NOTE — ED Notes (Addendum)
ED Provider at bedside. 

## 2017-12-18 NOTE — Progress Notes (Signed)
Orthopedic Tech Progress Note Patient Details:  Caroline HesselbachSusan M Phillips 05/05/1951 [id number]  Ortho Devices Type of Ortho Device: Radio broadcast assistantUnna boot Ortho Device/Splint Location: (B) LE Ortho Device/Splint Interventions: Ordered, Application   Post Interventions Patient Tolerated: Well Instructions Provided: Care of device   Jennye MoccasinHughes, Loura Pitt Craig 12/18/2017, 8:27 PM

## 2017-12-20 NOTE — Telephone Encounter (Signed)
Looks like this patient is scheduled to visit me on 5/9. I was going to sign HH orders as a rare situation despite no established therapeutic relationship yet (she has never been seen here), but it looks like the patient went to ED and new HH orders were signed. Will follow up on 5/9.

## 2017-12-22 ENCOUNTER — Emergency Department (HOSPITAL_COMMUNITY)
Admission: EM | Admit: 2017-12-22 | Discharge: 2017-12-22 | Disposition: A | Discharge status: Home / Self Care | Payer: Medicare Other | Attending: Emergency Medicine | Admitting: Emergency Medicine

## 2017-12-22 DIAGNOSIS — Z4800 Encounter for change or removal of nonsurgical wound dressing: Secondary | ICD-10-CM | POA: Diagnosis present

## 2017-12-22 DIAGNOSIS — Z79899 Other long term (current) drug therapy: Secondary | ICD-10-CM | POA: Insufficient documentation

## 2017-12-22 DIAGNOSIS — R7989 Other specified abnormal findings of blood chemistry: Secondary | ICD-10-CM | POA: Insufficient documentation

## 2017-12-22 DIAGNOSIS — F17210 Nicotine dependence, cigarettes, uncomplicated: Secondary | ICD-10-CM | POA: Diagnosis not present

## 2017-12-22 LAB — COMPREHENSIVE METABOLIC PANEL
ALBUMIN: 3.2 g/dL — AB (ref 3.5–5.0)
ALK PHOS: 77 U/L (ref 38–126)
ALT: 13 U/L — AB (ref 14–54)
ANION GAP: 10 (ref 5–15)
AST: 23 U/L (ref 15–41)
BUN: 24 mg/dL — ABNORMAL HIGH (ref 6–20)
CALCIUM: 8.9 mg/dL (ref 8.9–10.3)
CO2: 23 mmol/L (ref 22–32)
CREATININE: 1.06 mg/dL — AB (ref 0.44–1.00)
Chloride: 104 mmol/L (ref 101–111)
GFR calc Af Amer: >60 mL/min (ref >60)
GFR calc non Af Amer: 53 mL/min — ABNORMAL LOW (ref >60)
GLUCOSE: 103 mg/dL — AB (ref 65–99)
Potassium: 4.1 mmol/L (ref 3.5–5.1)
SODIUM: 137 mmol/L (ref 135–145)
Total Bilirubin: 0.9 mg/dL (ref 0.3–1.2)
Total Protein: 7.1 g/dL (ref 6.5–8.1)

## 2017-12-22 LAB — CBC WITH DIFFERENTIAL/PLATELET
BASOS PCT: 1 %
Basophils Absolute: 0 10*3/uL (ref 0.0–0.1)
Eosinophils Absolute: 0.2 10*3/uL (ref 0.0–0.7)
Eosinophils Relative: 3 %
HCT: 35.1 % — ABNORMAL LOW (ref 36.0–46.0)
Hemoglobin: 10.9 g/dL — ABNORMAL LOW (ref 12.0–15.0)
LYMPHS ABS: 1.4 10*3/uL (ref 0.7–4.0)
Lymphocytes Relative: 22 %
MCH: 29.4 pg (ref 26.0–34.0)
MCHC: 31.1 g/dL (ref 30.0–36.0)
MCV: 94.6 fL (ref 78.0–100.0)
MONO ABS: 0.5 10*3/uL (ref 0.1–1.0)
MONOS PCT: 8 %
NEUTROS ABS: 4.2 10*3/uL (ref 1.7–7.7)
Neutrophils Relative %: 66 %
Platelets: 289 10*3/uL (ref 150–400)
RBC: 3.71 MIL/uL — ABNORMAL LOW (ref 3.87–5.11)
RDW: 14.1 % (ref 11.5–15.5)
WBC: 6.4 10*3/uL (ref 4.0–10.5)

## 2017-12-22 NOTE — ED Triage Notes (Signed)
Pt to ER by [facility] for dressing change per EMS. States was here Wednesday for same.

## 2017-12-22 NOTE — ED Notes (Signed)
Dressings on BLE changed. Using, gauze, non adherent pads, and kurlex.

## 2017-12-22 NOTE — ED Notes (Signed)
Pt refused to put on gown, and Pt refused to be placed on BP and O2 monitor.

## 2017-12-22 NOTE — Discharge Instructions (Addendum)
It is important that your home health aide begins to change your dressings.  Follow up with your doctor about your kidney function. Return to the ER if you develop fevers, chills, vomiting, increased pain, or any new or concerning symptoms.

## 2017-12-22 NOTE — ED Notes (Signed)
Pt legs have very foul odor. Dressings have seeped through with drainage. Tachycardic. Patient states dressing hasn't been changed since Wednesday.

## 2017-12-22 NOTE — ED Provider Notes (Signed)
[facility] EMERGENCY DEPARTMENT Provider Note   CSN: [id number] Arrival date & time: 12/22/17  1322     History   Chief Complaint Chief Complaint  Patient presents with  . Dressing Change    HPI [name] is a 67 y.o. female presenting for dressing change.  Patient states that she has chronic wounds of bilateral legs, right worse than left.  She has her dressings changed every 2 days by home health aide, but she was unable to come on Friday.  She was hoping to wait until Monday, but states that she is starting to have seepage through her dressing.  She states her wounds have been healing.  She denies recent fevers, chills, nausea, vomiting, abdominal pain.  She states odor is consistent with baseline.  Dressings last changed on Wednesday.  Patient states she does not want anything done, is only here for dressing change.  She does not want to stay in the hospital or have significant work-up.  HPI  Past Medical History:  Diagnosis Date  . Arthritis   . Hypoglycemia   . Pneumonia, pneumococcal (HCC)     There are no active problems to display for this patient.   Past Surgical History:  Procedure Laterality Date  . ABDOMINAL HYSTERECTOMY    . CESAREAN SECTION       OB History    Gravida      Para      Term      Preterm      AB      Living  3     SAB      TAB      Ectopic      Multiple      Live Births               Home Medications    Prior to Admission medications   Medication Sig Start Date End Date Taking? Authorizing Provider  furosemide (LASIX) 20 MG tablet Take 0.5 tablets (10 mg total) by mouth daily. Patient taking differently: Take 20 mg by mouth daily.  12/10/17   Arthor Captain, PA-C  ibuprofen (ADVIL,MOTRIN) 200 MG tablet Take 400 mg by mouth as needed.    [provider]  Multiple Vitamins-Minerals (CENTRUM SILVER 50+WOMEN PO) Take 1 tablet by mouth daily.    [provider]    Family  History No family history on file.  Social History Social History   Tobacco Use  . Smoking status: Heavy Tobacco Smoker    Packs/day: 0.50  . Smokeless tobacco: Never Used  Substance Use Topics  . Alcohol use: No  . Drug use: No     Allergies   Codeine   Review of Systems Review of Systems  Constitutional: Negative for chills and fever.  Skin: Positive for wound.       Chronic bilateral leg wounds.     Physical Exam Updated Vital Signs BP 114/63 (BP Location: Right Arm)   Pulse (!) 108   Temp 98.7 F (37.1 C) (Oral)   Resp 18   SpO2 100%   Physical Exam  Constitutional: She is oriented to person, place, and time. She appears well-developed and well-nourished. No distress.  HENT:  Head: Normocephalic and atraumatic.  Eyes: EOM are normal.  Neck: Normal range of motion.  Cardiovascular: Regular rhythm and intact distal pulses.  Tachycardic  Pulmonary/Chest: Effort normal and breath sounds normal. No respiratory distress. She has no wheezes.  Abdominal: She exhibits no distension.  Musculoskeletal: Normal range of motion.  Good pedal pulses  Neurological: She is alert and oriented to person, place, and time.  Skin: Skin is warm. Capillary refill takes less than 2 seconds. No rash noted.  Chronic bilateral leg wounds. appearence similar to previous visits without increased redness or swelling.  Foul odor.   Psychiatric: She has a normal mood and affect.  Nursing note and vitals reviewed.    ED Treatments / Results  Labs (all labs ordered are listed, but only abnormal results are displayed) Labs Reviewed  COMPREHENSIVE METABOLIC PANEL - Abnormal; Notable for the following components:      Result Value   Glucose, Bld 103 (*)    BUN 24 (*)    Creatinine, Ser 1.06 (*)    Albumin 3.2 (*)    ALT 13 (*)    GFR calc non Af Amer 53 (*)    All other components within normal limits  CBC WITH DIFFERENTIAL/PLATELET - Abnormal; Notable for the following components:    RBC 3.71 (*)    Hemoglobin 10.9 (*)    HCT 35.1 (*)    All other components within normal limits    EKG None  Radiology No results found.  Procedures Procedures (including critical care time)  Medications Ordered in ED Medications - No data to display   Initial Impression / Assessment and Plan / ED Course  I have reviewed the triage vital signs and the nursing notes.  Pertinent labs & imaging results that were available during my care of the patient were reviewed by me and considered in my medical decision making (see chart for details).     Patient presenting for dressing change.  States she does not want anything else done today.  Wounds similar in appearance with previous visits.  She is tachycardic, same as last visit.  She denies fevers.  Labs show no leukocytosis.  Patient reports wounds are improving.  She is not on antibiotics at this time.  Will change dressings and encourage patient to follow-up with home health. SCr slowly increasing, pt to f/u with PCP. Final HR taken after pt was walking.   At this time, patient appears safe for discharge.  Return precautions given.  Patient states she understands and agrees plan.   Final Clinical Impressions(s) / ED Diagnoses   Final diagnoses:  Dressing change  Creatinine elevation    ED Discharge Orders    None       Alveria Apley, PA-C 12/22/17 1840    Gerhard Munch, MD 12/23/17 2121

## 2017-12-23 ENCOUNTER — Telehealth: Payer: Self-pay | Admitting: *Deleted

## 2017-12-23 NOTE — Telephone Encounter (Signed)
EDCM contacted to assist with additional orders for [facility].  EDCM obtained verbal orders to continue HH until pt is seen by PCP.

## 2017-12-24 ENCOUNTER — Telehealth: Payer: Self-pay

## 2017-12-24 NOTE — Telephone Encounter (Addendum)
CSW received phone call from Lacie Scotts, [phone] with the [employer]. French Ana supervises the building in which the pt lives. French Ana reported that Home Health has not been out since pt was in the ED last Wednesday, 4/24. French Ana reported she sent the pt to the ED on Sunday for her wound dressings to be changed.   Pt is being followed by [facility]. CSW called Eugenio Hoes to report the issue. Alvino Chapel informed CSW that she will speak to pt's nurse to see when pt is on the schedule and call French Ana back to report when nurse will be out. Pt does not have a working phone.   Home Health nurse is on the schedule to change the dressing 3 times a week.   Montine Circle, [facility]  [phone]

## 2017-12-24 NOTE — Telephone Encounter (Signed)
[facility] has called concerning orders for skilled nurse frequency visits. I told them it was noted that the ED had already put orders in. She said that their orders were not detailed enough. They are wondering if Dr Chanetta Marshall could put orders in for skilled nurse visits for 3 times a week for 7 weeks and 2 as needed TRN visits. The best number to call back is [phone]

## 2017-12-24 NOTE — Telephone Encounter (Signed)
Spoke with Dr. Leveda Anna about patient- home health still needing orders to see patient from now until 01/02/18. Dr. Leveda Anna gave verbal orders okaying wound care through 01/02/18. Advised Nicolette that per Dr. Leveda Anna, patient MUST keep her appointment 01/02/18 with Dr. Chanetta Marshall in order for [place] to continue providing orders. Shawna Orleans, RN

## 2018-01-02 ENCOUNTER — Ambulatory Visit (HOSPITAL_COMMUNITY)
Admission: RE | Admit: 2018-01-02 | Discharge: 2018-01-02 | Disposition: A | Discharge status: Home / Self Care | Payer: Medicare Other | Source: Ambulatory Visit | Attending: Family Medicine | Admitting: Family Medicine

## 2018-01-02 ENCOUNTER — Other Ambulatory Visit: Payer: Self-pay

## 2018-01-02 ENCOUNTER — Encounter: Payer: Self-pay | Admitting: Family Medicine

## 2018-01-02 ENCOUNTER — Ambulatory Visit (INDEPENDENT_AMBULATORY_CARE_PROVIDER_SITE_OTHER): Payer: Medicare Other | Admitting: Family Medicine

## 2018-01-02 VITALS — BP 128/66 | HR 121 | Temp 98.2°F | Ht 63.0 in | Wt 178.8 lb

## 2018-01-02 DIAGNOSIS — F3110 Bipolar disorder, current episode manic without psychotic features, unspecified: Secondary | ICD-10-CM

## 2018-01-02 DIAGNOSIS — D649 Anemia, unspecified: Secondary | ICD-10-CM

## 2018-01-02 DIAGNOSIS — Z1159 Encounter for screening for other viral diseases: Secondary | ICD-10-CM | POA: Diagnosis not present

## 2018-01-02 DIAGNOSIS — I4891 Unspecified atrial fibrillation: Secondary | ICD-10-CM | POA: Diagnosis not present

## 2018-01-02 DIAGNOSIS — R000 Tachycardia, unspecified: Secondary | ICD-10-CM | POA: Insufficient documentation

## 2018-01-02 DIAGNOSIS — Z23 Encounter for immunization: Secondary | ICD-10-CM

## 2018-01-02 DIAGNOSIS — R7989 Other specified abnormal findings of blood chemistry: Secondary | ICD-10-CM

## 2018-01-02 DIAGNOSIS — R9431 Abnormal electrocardiogram [ECG] [EKG]: Secondary | ICD-10-CM | POA: Diagnosis not present

## 2018-01-02 NOTE — Progress Notes (Signed)
   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 Not on any medicine for this. Uses the "man upstairs" for her medicine she states. Refuses to ever go on mood medication. Has been on psych meds in the past, no recollection of names.  Used to have a murmur and AF per her report. States she is always in afib. States she will not go to a heart doctor because her daughter was a heart patient and died and they "took her case state wide on the news." and "they won't do that to me."  Takes Lasix for "water for the legs." she is running out of these. Took these in high school per her report. Centrum silver is her only other medication.  States she is allergic to tylenol because it gives her hives, states ED RN told her this (no documentation to support this)  Surgical history: Appendectomy 1975, C section 1974, tubal ligation 1974.   Social history:  Lives with: alone, apartment Occupation: not working Tobacco use: 1/2 ppd x 50 years, has been decreasing to 1/2 ppd now, used to be more Alcohol use: never  Drug use: never  Played football in high school with six other girls. 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   CC, SH/smoking status, and VS noted  Objective: BP 128/66   Pulse (!) 121   Temp 98.2 F (36.8 C) (Oral)   Ht  (1.6 m)   Wt 178 lb 12.8 oz (81.1 kg)   SpO2 98%   BMI 31.67 kg/m  Gen: NAD, alert, tangential, appears older than stated age.  HEENT: NCAT, EOMI, PERRL CV: irregular rhythm, no murmur, tachycardia Resp: coarse breath sounds bilaterally, no wheezes, non-labored Abd: SNTND, BS present, no  guarding or organomegaly Ext: 2+ edema, warm, wrapped.  Neuro: Alert and oriented, Speech clear, No gross deficits  Assessment and plan:  Bipolar I disorder with mania (HCC) Patient seems to be in a hypomanic state today. Extremely tangential. Talks to her leg 2/2 pain. Refuses psych. Denies HI or SI. Not a danger to herself today as she has been regularly seeking medical care for her leg wounds.  Atrial fibrillation [facility]) Patient with new to [place] afib. States she has had this for years. Attempted labs to workup underlying etiology, but patient refused (TSH, CBC). Asked her to follow up in 1 week. If she follows up, would consider further workup and NOAC. Will not give anticoagulation today as patient is a high risk for taking this incorrectly or not following up.   Anemia Patient refused labs for further workup.   Orders Placed This Encounter  Procedures  . Pneumococcal conjugate vaccine 13-valent IM  . EKG 12-Lead    No orders of the defined types were placed in this encounter.   Loni Muse, MD, PGY2 01/03/2018 1:24 PM

## 2018-01-02 NOTE — Patient Instructions (Addendum)
It was a pleasure to see you today! Thank you for choosing [facility] for your primary care. [name] was seen for new patient.   Our plans for today were:  I will call you if your labs are abnormal.   Please have your home health nurse reach out to me at [phone] or [phone] for fax.   Consider going back to the bipolar doctor. You can call them to schedule an appt at Call the Behavioral Medicine center to schedule an appointment. Their phone number is: [phone].   To keep you healthy, we need to monitor some screening tests. You are due for mammogram, colonoscopy. Someone will call you to schedule these.   You should return to our clinic to see Dr. Chanetta Marshall in 1 week for heart rate.   Best,  Dr. Chanetta Marshall

## 2018-01-03 DIAGNOSIS — F3110 Bipolar disorder, current episode manic without psychotic features, unspecified: Secondary | ICD-10-CM | POA: Insufficient documentation

## 2018-01-03 DIAGNOSIS — I4891 Unspecified atrial fibrillation: Secondary | ICD-10-CM | POA: Insufficient documentation

## 2018-01-03 DIAGNOSIS — R7989 Other specified abnormal findings of blood chemistry: Secondary | ICD-10-CM | POA: Insufficient documentation

## 2018-01-03 DIAGNOSIS — D649 Anemia, unspecified: Secondary | ICD-10-CM | POA: Insufficient documentation

## 2018-01-03 NOTE — Assessment & Plan Note (Signed)
Patient refused labs for further workup.

## 2018-01-03 NOTE — Assessment & Plan Note (Signed)
Patient with new to [place] afib. States she has had this for years. Attempted labs to workup underlying etiology, but patient refused (TSH, CBC). Asked her to follow up in 1 week. If she follows up, would consider further workup and NOAC. Will not give anticoagulation today as patient is a high risk for taking this incorrectly or not following up.

## 2018-01-03 NOTE — Assessment & Plan Note (Signed)
Patient seems to be in a hypomanic state today. Extremely tangential. Talks to her leg 2/2 pain. Refuses psych. Denies HI or SI. Not a danger to herself today as she has been regularly seeking medical care for her leg wounds.

## 2018-01-06 ENCOUNTER — Telehealth: Payer: Self-pay

## 2018-01-06 NOTE — Telephone Encounter (Signed)
[name] with [facility] called for verbal orders:  Skilled nursing 3 x/week through the certification period for Bilateral unna boot.  Call back is [phone]  Ples Specter, RN [facility] RN)

## 2018-01-06 NOTE — Telephone Encounter (Signed)
Also, received message from [facility] with [facility] wanting verbal orders for wound care and frequency.   Tameka's call back is [phone].  Ples Specter, RN [facility] RN)

## 2018-01-07 NOTE — Telephone Encounter (Signed)
Called [name] to discuss verbal orders. Gave verbal orders for wound care TID, asked for home safety assessment from RN, they will have CSW assess given patient's bipolar and refusal to get labs last week.

## 2018-01-27 ENCOUNTER — Telehealth: Payer: Self-pay

## 2018-01-27 NOTE — Telephone Encounter (Signed)
GrenadaBrittany, RN with [facility], would like to speak to PCP to give update on patient's legs and non-compliance.  Call back is [phone]  Ples SpecterAlisa Brake, RN [facility] RN)

## 2018-01-29 NOTE — Telephone Encounter (Signed)
Returned call to GrenadaBrittany. She wants to tell us that the patient has been noncompliant. Her right leg is red and new odor, but patient is refusing to come in and be seen here. Patient states she is going to cover her legs with Vaseline and put them in a plastic bag and this will get out any infection. Patient is refusing wraps and removing them before the RN returns. Of note, patient with hx of diagnosis for bipolar and refused psych eval last time she was in my office, also refused labs.

## 2018-02-05 ENCOUNTER — Encounter: Payer: Self-pay | Admitting: Family Medicine

## 2018-02-05 ENCOUNTER — Ambulatory Visit (INDEPENDENT_AMBULATORY_CARE_PROVIDER_SITE_OTHER): Payer: Medicare Other | Admitting: Family Medicine

## 2018-02-05 ENCOUNTER — Other Ambulatory Visit: Payer: Self-pay

## 2018-02-05 VITALS — BP 116/78 | HR 100 | Temp 97.9°F | Ht 63.0 in

## 2018-02-05 DIAGNOSIS — S81801A Unspecified open wound, right lower leg, initial encounter: Secondary | ICD-10-CM | POA: Diagnosis present

## 2018-02-05 MED ORDER — CEPHALEXIN 500 MG PO CAPS: 500.0000 mg | ORAL_CAPSULE | Freq: Two times a day (BID) | ORAL | 0 refills | Status: DC

## 2018-02-05 NOTE — Patient Instructions (Addendum)
It was a pleasure to see you today! Thank you for choosing [facility] for your primary care. Byrd HesselbachSusan M Steig was seen for R leg wound.   Our plans for today were:  I sent an urgent referral to the wound center, please call them next week if you haven't gotten a call.   Please take your antibiotic for 10 days. Call us if things get worse.   Best,  Dr. Chanetta Marshallimberlake

## 2018-02-05 NOTE — Progress Notes (Signed)
   CC: leg odor  HPI  Patient with hx of chronic venous stasis weeping and wounds bilateral lower extremities.  She has a home health nurse coming to change dressings multiple times per week.  She states that her right lower extremity has developed a very strong odor, which she is unable to tolerate.  Of note, her home health nurse called me last week and told me that the patient refused to come in to be seen.  She denies fever, is eating normally.  There is associated pain with right lower extremity wound.  ROS: Denies CP, SOB, abdominal pain, dysuria, changes in BMs.   CC, SH/smoking status, and VS noted  Objective: BP 116/78 (BP Location: Right Arm, Patient Position: Sitting, Cuff Size: Large)   Pulse 100   Temp 97.9 F (36.6 C) (Oral)   Ht 5\' 3"  (1.6 m)   SpO2 97%   BMI 31.67 kg/m  Gen: NAD, alert, cooperative, and pleasant. HEENT: NCAT, EOMI, PERRL CV: RRR, no murmur Resp: CTAB, no wheezes, non-labored     Ext: Bilateral lower extremities pink and well perfused. L shin with 1cm x 2cm moist yellow area, 2+ edema. R leg as pictured above, extremely malodorous and painful to touch around area of wound.  Neuro: Alert and oriented, Speech clear, No gross deficits  Assessment and plan:  Wound of right leg Patient with chronic bilateral lower extremity venous stasis wounds is receiving home health RN care for this.  Left lower extremity with 1 very mild moist area.  Right lower extremity with nearly circumferential malodorous moist tissue.  Urgent referral placed to wound center. Patient is agreeable to this.  Continue home health RN care.  If unable to get to wound center, would consider Unna boots in our clinic.  Additionally, given 10 days of Keflex.   Orders Placed This Encounter  Procedures  . Ambulatory referral to Wound Clinic    Referral Priority:   Urgent    Referral Type:   Consultation    Referral Reason:   Specialty Services Required    Requested Specialty:    Wound Care    Number of Visits Requested:   1    Meds ordered this encounter  Medications  . cephALEXin (KEFLEX) 500 MG capsule    Sig: Take 1 capsule (500 mg total) by mouth 2 (two) times daily.    Dispense:  20 capsule    Refill:  0   Loni MuseKate Desarea Ohagan, MD, PGY2 02/06/2018 8:01 AM

## 2018-02-06 DIAGNOSIS — S81801A Unspecified open wound, right lower leg, initial encounter: Secondary | ICD-10-CM | POA: Insufficient documentation

## 2018-02-06 NOTE — Assessment & Plan Note (Signed)
Patient with chronic bilateral lower extremity venous stasis wounds is receiving home health RN care for this.  Left lower extremity with 1 very mild moist area.  Right lower extremity with nearly circumferential malodorous moist tissue.  Urgent referral placed to wound center. Patient is agreeable to this.  Continue home health RN care.  If unable to get to wound center, would consider Unna boots in our clinic.  Additionally, given 10 days of Keflex.

## 2018-02-07 ENCOUNTER — Telehealth: Payer: Self-pay

## 2018-02-07 NOTE — Telephone Encounter (Signed)
Wandra Mannanameka, [facility], would like to know what PCP thought of patient's wound at [facility]. Patient non-compliant with instructions. Are there any new orders?  Please call [phone]  Ples SpecterAlisa Chelsi Warr, RN [facility])

## 2018-02-07 NOTE — Telephone Encounter (Signed)
Patient was given urgent referral to wound center for debridement and rx for antibiotics. Would continue current wound care until wound center assesses. Patient's insight seems limited by bipolar disorder, but she will not see psych for this.

## 2018-02-11 ENCOUNTER — Telehealth: Payer: Self-pay | Admitting: *Deleted

## 2018-02-11 NOTE — Telephone Encounter (Signed)
Spoke to home health nurse and gave verbal orders.  She is going to try to personally call the wound care center when she is with the patient tomorrow.

## 2018-02-11 NOTE — Telephone Encounter (Signed)
Tameka called for 2 reasons:  1. To let the provider know that the wound care has not been able to get in touch with patient.  She will go out today or tomorrow and call while she is there.  2. Needs verbal orders to continue wound care 3x weekly with 2 PRN visits.  Fleeger, Maryjo RochesterJessica Dawn, CMA

## 2018-02-11 NOTE — Telephone Encounter (Signed)
Spoke to Melvinameka, RN with [facility]. Needs order for 3 visits a week and 2 PRN visits per week for wound care. She needs to go over there today or tomorrow to help pt set up wound care center appointment. Please give Tameka a call at [phone]. [name], CMA

## 2018-03-03 ENCOUNTER — Encounter (HOSPITAL_BASED_OUTPATIENT_CLINIC_OR_DEPARTMENT_OTHER): Payer: Medicare Other | Attending: Internal Medicine

## 2018-03-03 DIAGNOSIS — I890 Lymphedema, not elsewhere classified: Secondary | ICD-10-CM | POA: Insufficient documentation

## 2018-03-03 DIAGNOSIS — I87333 Chronic venous hypertension (idiopathic) with ulcer and inflammation of bilateral lower extremity: Secondary | ICD-10-CM | POA: Insufficient documentation

## 2018-03-03 DIAGNOSIS — L97215 Non-pressure chronic ulcer of right calf with muscle involvement without evidence of necrosis: Secondary | ICD-10-CM | POA: Insufficient documentation

## 2018-03-03 DIAGNOSIS — L97222 Non-pressure chronic ulcer of left calf with fat layer exposed: Secondary | ICD-10-CM | POA: Diagnosis not present

## 2018-03-03 DIAGNOSIS — F17200 Nicotine dependence, unspecified, uncomplicated: Secondary | ICD-10-CM | POA: Diagnosis not present

## 2018-03-24 ENCOUNTER — Ambulatory Visit: Payer: Medicare Other | Admitting: Student in an Organized Health Care Education/Training Program

## 2018-03-27 ENCOUNTER — Telehealth: Payer: Self-pay

## 2018-03-27 DIAGNOSIS — S81801A Unspecified open wound, right lower leg, initial encounter: Secondary | ICD-10-CM

## 2018-03-27 NOTE — Telephone Encounter (Signed)
Tamika, case manager with [facility] home health called to discuss patient non-compliance.  Patient will not keep wound care appt. Tamika rescheduled it and was going to transport patient herself but patient refused to go. Wound Care will now not see patient and are dropping her. Will need whole new referral to be seen.  Tamika had scheduled patient appt here but patient canceled. Rescheduled for Sept.  Patient will not let nurses care for leg. She is keeping it wrapped but will not let them clean it. Leg looks and smells horrible. Tamika concerned patient may become septic.  They do not want to drop patient but do not know how to proceed or carry out orders.  Please call her [phone]  Ples SpecterAlisa Brake, RN [facility])

## 2018-03-28 NOTE — Telephone Encounter (Signed)
Hi Team, Appreciate the care with this. I am on night float, so have limited ability to [facility]. I am aware that she is noncompliant, and I'm not sure how to address this further. I have precepted this with Dr. Leveda AnnaHensel in the past, maybe he will recall, and I will copy him. I suppose if we think she is not having capacity to determine her health issues and repercussions of refusing care, we could call EMS and have her brought to the ED? Dr. Leveda AnnaHensel, any thoughts?

## 2018-03-28 NOTE — Telephone Encounter (Signed)
Dr Leveda AnnaHensel out of office for two weeks, fyi.

## 2018-03-31 NOTE — Telephone Encounter (Signed)
Again, on night float so limited ability to call. Here are my thoughts (precepted with Dr. Deirdre Priesthambliss):  1. Please encourage HH RNs to continue to go and attempt to treat patient. This is our only means of accessing patient and checking on her.  2. If they are concerned for sepsis or altered mental status or lack of mental decision making capacity, please call EMS and [facility] to [facility].  3. Our RN team, please call patient and attempt to schedule an appt for her to come in to discuss leg wounds.  4. I will replace wound care referral.

## 2018-03-31 NOTE — Telephone Encounter (Signed)
Contacted [name] with [facility] and informed her of pcp recommendations and new wound care referral. [name] stated as of Friday, 8/2, the patient only has 2 weeks left with [facility], as she has been dismissed for non compliance. [name] will continue to go out to pts home and try to provide care. [name] stated the patient is very nasty and refuses to let her check her dressings. [name] did say the wound is starting to look better, but she will continue to monitor. [facility] will keep her on as a client if she happens to improved her compliance in the next two weeks. I did try to call patient to get an earlier apt [facility], she did not answer, so a VM was left.

## 2018-04-07 NOTE — Telephone Encounter (Signed)
I do not recall this patient - so my comments will be general rather than specific.    Of course, in our culture, people are able to make bad decisions - up to a point.  So, the fact that she is refusing home health and not keeping follow up visits does not mean we should intervene.    The standard is that her bad decisions would need to cause an immediate threat to herself or to others.    The only diagnosis on her problem list that might cause her to lack capacity is the bipolar disorder.  It would be ideal if psych was involved in her care and could comment on capacity.  If we are in doubt and she does not willingly follow up with us or home health, we can involve adult protective services.  They would then assess and decide if she is a threat to self.

## 2018-04-09 NOTE — Telephone Encounter (Signed)
Thank y'all so much. I am on nights this week, so will call patient myself on Monday and APS likely after that. I believe I placed a social work order with [facility] back in May when I met the patient.

## 2018-04-11 ENCOUNTER — Encounter (HOSPITAL_COMMUNITY): Payer: Self-pay | Admitting: Emergency Medicine

## 2018-04-11 ENCOUNTER — Other Ambulatory Visit: Payer: Self-pay

## 2018-04-11 ENCOUNTER — Emergency Department (HOSPITAL_COMMUNITY)
Admission: EM | Admit: 2018-04-11 | Discharge: 2018-04-12 | Disposition: A | Discharge status: Home / Self Care | Payer: Medicare Other | Attending: Emergency Medicine | Admitting: Emergency Medicine

## 2018-04-11 DIAGNOSIS — Z79899 Other long term (current) drug therapy: Secondary | ICD-10-CM | POA: Insufficient documentation

## 2018-04-11 DIAGNOSIS — Z4800 Encounter for change or removal of nonsurgical wound dressing: Secondary | ICD-10-CM | POA: Diagnosis not present

## 2018-04-11 DIAGNOSIS — F17210 Nicotine dependence, cigarettes, uncomplicated: Secondary | ICD-10-CM | POA: Insufficient documentation

## 2018-04-11 NOTE — ED Triage Notes (Signed)
Pt to triage via [facility].  C/o wound to R lower leg x 8 months that she goes to the wound clinic for.  States nurse did not come out today to change dressing and it is foul smelling and time for it to be changed.

## 2018-04-12 NOTE — ED Provider Notes (Signed)
[facility] Provider Note   CSN: [id number] Arrival date & time: 04/11/18  2336     History   Chief Complaint Chief Complaint  Patient presents with  . Wound Check    HPI [name] is a 67 y.o. female.  67 year old female presents to the emergency department for recent change of her right lower extremity wounds.  Has been managing wounds x8 months through wound care clinic.  Usually changes her dressings 3 times per week, but her nurse was unable to come out to her home today.  Does not have any supplies at home to facilitate her own dressing changes.  States that the wound is always foul-smelling.  She has not had any recent fevers.  No increased drainage from the wound.  No increased pain.     Past Medical History:  Diagnosis Date  . Arthritis   . Hypoglycemia   . Pneumonia, pneumococcal [facility](HCC)     Patient Active Problem List   Diagnosis Date Noted  . Wound of right leg 02/06/2018  . Atrial fibrillation (HCC) 01/03/2018  . Anemia 01/03/2018  . Elevated serum creatinine 01/03/2018  . Bipolar I disorder with mania (HCC) 01/03/2018    Past Surgical History:  Procedure Laterality Date  . ABDOMINAL HYSTERECTOMY    . CESAREAN SECTION       OB History    Gravida      Para      Term      Preterm      AB      Living  3     SAB      TAB      Ectopic      Multiple      Live Births               Home Medications    Prior to Admission medications   Medication Sig Start Date End Date Taking? Authorizing Provider  cephALEXin (KEFLEX) 500 MG capsule Take 1 capsule (500 mg total) by mouth 2 (two) times daily. 02/05/18   Garth Bignessimberlake, Kathryn, MD  furosemide (LASIX) 20 MG tablet Take 0.5 tablets (10 mg total) by mouth daily. Patient taking differently: Take 20 mg by mouth daily.  12/10/17   Arthor CaptainHarris, Abigail, PA-C  ibuprofen (ADVIL,MOTRIN) 200 MG tablet Take 400 mg by mouth as needed.    [provider]    Multiple Vitamins-Minerals (CENTRUM SILVER 50+WOMEN PO) Take 1 tablet by mouth daily.    [provider]    Family History No family history on file.  Social History Social History   Tobacco Use  . Smoking status: Heavy Tobacco Smoker    Packs/day: 0.50  . Smokeless tobacco: Never Used  Substance Use Topics  . Alcohol use: No  . Drug use: No     Allergies   Codeine   Review of Systems Review of Systems Ten systems reviewed and are negative for acute change, except as noted in the HPI.    Physical Exam Updated Vital Signs BP 120/77 (BP Location: Right Arm)   Pulse 82   Temp 98.6 F (37 C) (Oral)   Resp 16   SpO2 99%   Physical Exam  Constitutional: She is oriented to person, place, and time. She appears well-developed and well-nourished. No distress.  Nontoxic appearing and in NAD  HENT:  Head: Normocephalic and atraumatic.  Eyes: Conjunctivae and EOM are normal. No scleral icterus.  Neck: Normal range of motion.  Pulmonary/Chest: Effort  normal. No respiratory distress.  Respirations even and unlabored  Musculoskeletal: Normal range of motion.  Large ulcerative wounds to RLE, likely 2/2 chronic venous insufficiency. Foul smelling. No active purulence.   Neurological: She is alert and oriented to person, place, and time. She exhibits normal muscle tone. Coordination normal.  Skin: No rash noted. She is not diaphoretic. No pallor.  Psychiatric: She has a normal mood and affect. Her behavior is normal.  Nursing note and vitals reviewed.    ED Treatments / Results  Labs (all labs ordered are listed, but only abnormal results are displayed) Labs Reviewed - No data to display  EKG None  Radiology No results found.  Procedures Procedures (including critical care time)  The wound is cleansed, debrided of foreign material as much as possible, and dressed. The patient is alerted to watch for any signs of infection (redness, pus, pain, increased  swelling or fever) and call if such occurs. Home wound care instructions are provided.    Medications Ordered in ED Medications - No data to display   Initial Impression / Assessment and Plan / ED Course  I have reviewed the triage vital signs and the nursing notes.  Pertinent labs & imaging results that were available during my care of the patient were reviewed by me and considered in my medical decision making (see chart for details).     67 year old female presents for wound dressing change.  This was done at bedside using Xeroform gauze, ABD pad, ACE wrap and coband. Patient expresses thanks for care. She has no signs of acute infection or cellulitis. Have encouraged continued follow up with her wound care clinic. Return precautions discussed and provided. Patient discharged in stable condition with no unaddressed concerns.   Final Clinical Impressions(s) / ED Diagnoses   Final diagnoses:  Change or removal of wound dressing    ED Discharge Orders    None       Antony MaduraHumes, Shenea Giacobbe, PA-C 04/12/18 0138    Azalia Bilisampos, Kevin, MD 04/12/18 [phone]

## 2018-04-12 NOTE — ED Notes (Signed)
Pt refused to change in to gown. Provider in with pt now.

## 2018-04-16 ENCOUNTER — Telehealth: Payer: Self-pay

## 2018-04-16 NOTE — Telephone Encounter (Signed)
Tameka with [facility] left message to let PCP know that patient has been discharged due to non-compliance. She does hope a new referral will be placed to another agency.   Patient has scheduled appt with PCP on 05/01/18 and had recent dressing change at ED.  Tameka [phone]  Ples SpecterAlisa Boyd Litaker, RN [facility])

## 2018-04-18 ENCOUNTER — Emergency Department (HOSPITAL_COMMUNITY)
Admission: EM | Admit: 2018-04-18 | Discharge: 2018-04-18 | Disposition: A | Discharge status: Home / Self Care | Payer: Medicare Other | Attending: Emergency Medicine | Admitting: Emergency Medicine

## 2018-04-18 ENCOUNTER — Other Ambulatory Visit: Payer: Self-pay

## 2018-04-18 ENCOUNTER — Encounter (HOSPITAL_COMMUNITY): Payer: Self-pay

## 2018-04-18 DIAGNOSIS — M79661 Pain in right lower leg: Secondary | ICD-10-CM | POA: Diagnosis present

## 2018-04-18 DIAGNOSIS — F17210 Nicotine dependence, cigarettes, uncomplicated: Secondary | ICD-10-CM | POA: Insufficient documentation

## 2018-04-18 DIAGNOSIS — Z79899 Other long term (current) drug therapy: Secondary | ICD-10-CM | POA: Diagnosis not present

## 2018-04-18 DIAGNOSIS — R509 Fever, unspecified: Secondary | ICD-10-CM | POA: Diagnosis not present

## 2018-04-18 DIAGNOSIS — Z5320 Procedure and treatment not carried out because of patient's decision for unspecified reasons: Secondary | ICD-10-CM | POA: Diagnosis not present

## 2018-04-18 DIAGNOSIS — R5383 Other fatigue: Secondary | ICD-10-CM | POA: Diagnosis not present

## 2018-04-18 DIAGNOSIS — L03115 Cellulitis of right lower limb: Secondary | ICD-10-CM | POA: Insufficient documentation

## 2018-04-18 LAB — CBC WITH DIFFERENTIAL/PLATELET
Abs Immature Granulocytes: 0 10*3/uL (ref 0.0–0.1)
Basophils Absolute: 0.1 10*3/uL (ref 0.0–0.1)
Basophils Relative: 1 %
EOS ABS: 0.4 10*3/uL (ref 0.0–0.7)
EOS PCT: 7 %
HCT: 36.1 % (ref 36.0–46.0)
Hemoglobin: 10.9 g/dL — ABNORMAL LOW (ref 12.0–15.0)
IMMATURE GRANULOCYTES: 0 %
Lymphocytes Relative: 25 %
Lymphs Abs: 1.4 10*3/uL (ref 0.7–4.0)
MCH: 28.8 pg (ref 26.0–34.0)
MCHC: 30.2 g/dL (ref 30.0–36.0)
MCV: 95.5 fL (ref 78.0–100.0)
MONOS PCT: 11 %
Monocytes Absolute: 0.6 10*3/uL (ref 0.1–1.0)
NEUTROS PCT: 56 %
Neutro Abs: 3.1 10*3/uL (ref 1.7–7.7)
PLATELETS: 252 10*3/uL (ref 150–400)
RBC: 3.78 MIL/uL — AB (ref 3.87–5.11)
RDW: 15.4 % (ref 11.5–15.5)
WBC: 5.6 10*3/uL (ref 4.0–10.5)

## 2018-04-18 LAB — COMPREHENSIVE METABOLIC PANEL
ALT: 12 U/L (ref 0–44)
AST: 18 U/L (ref 15–41)
Albumin: 3.3 g/dL — ABNORMAL LOW (ref 3.5–5.0)
Alkaline Phosphatase: 68 U/L (ref 38–126)
Anion gap: 9 (ref 5–15)
BUN: 17 mg/dL (ref 8–23)
CO2: 24 mmol/L (ref 22–32)
Calcium: 9.1 mg/dL (ref 8.9–10.3)
Chloride: 109 mmol/L (ref 98–111)
Creatinine, Ser: 1.03 mg/dL — ABNORMAL HIGH (ref 0.44–1.00)
GFR calc Af Amer: >60 mL/min (ref >60)
GFR calc non Af Amer: 55 mL/min — ABNORMAL LOW (ref >60)
Glucose, Bld: 120 mg/dL — ABNORMAL HIGH (ref 70–99)
Potassium: 3.8 mmol/L (ref 3.5–5.1)
Sodium: 142 mmol/L (ref 135–145)
Total Bilirubin: 0.5 mg/dL (ref 0.3–1.2)
Total Protein: 7.3 g/dL (ref 6.5–8.1)

## 2018-04-18 LAB — I-STAT CG4 LACTIC ACID, ED: Lactic Acid, Venous: 1.89 mmol/L (ref 0.5–1.9)

## 2018-04-18 MED ORDER — FENTANYL CITRATE (PF) 100 MCG/2ML IJ SOLN: 50.0000 ug | Freq: Once | INTRAMUSCULAR | Status: DC

## 2018-04-18 MED ORDER — CLINDAMYCIN HCL 150 MG PO CAPS: 450.0000 mg | ORAL_CAPSULE | Freq: Four times a day (QID) | ORAL | 0 refills | Status: AC

## 2018-04-18 MED ORDER — NAPROXEN 250 MG PO TABS
500.0000 mg | ORAL_TABLET | Freq: Once | ORAL | Status: AC
  Administered 2018-04-18: 500 mg via ORAL
  Filled 2018-04-18: qty 2

## 2018-04-18 MED ORDER — SODIUM CHLORIDE 0.9 % IV BOLUS: 500.0000 mL | Freq: Once | INTRAVENOUS | Status: DC

## 2018-04-18 MED ORDER — CLINDAMYCIN HCL 150 MG PO CAPS
450.0000 mg | ORAL_CAPSULE | Freq: Once | ORAL | Status: AC
  Administered 2018-04-18: 450 mg via ORAL
  Filled 2018-04-18: qty 3

## 2018-04-18 NOTE — ED Provider Notes (Signed)
[facility] EMERGENCY DEPARTMENT Provider Note   CSN: [id number] Arrival date & time: 04/18/18  1041     History   Chief Complaint No chief complaint on file.   HPI [name] is a 67 y.o. female.  The history is provided by the patient and medical records. No language interpreter was used.  Illness  This is a recurrent problem. The current episode started more than 2 days ago. The problem occurs constantly. The problem has been rapidly worsening. Pertinent negatives include no chest pain, no abdominal pain, no headaches and no shortness of breath. Nothing aggravates the symptoms. Nothing relieves the symptoms. She has tried nothing for the symptoms. The treatment provided no relief.    Past Medical History:  Diagnosis Date  . Arthritis   . Hypoglycemia   . Pneumonia, pneumococcal [facility])     Patient Active Problem List   Diagnosis Date Noted  . Wound of right leg 02/06/2018  . Atrial fibrillation (HCC) 01/03/2018  . Anemia 01/03/2018  . Elevated serum creatinine 01/03/2018  . Bipolar I disorder with mania (HCC) 01/03/2018    Past Surgical History:  Procedure Laterality Date  . ABDOMINAL HYSTERECTOMY    . CESAREAN SECTION       OB History    Gravida      Para      Term      Preterm      AB      Living  3     SAB      TAB      Ectopic      Multiple      Live Births               Home Medications    Prior to Admission medications   Medication Sig Start Date End Date Taking? Authorizing Provider  cephALEXin (KEFLEX) 500 MG capsule Take 1 capsule (500 mg total) by mouth 2 (two) times daily. 02/05/18   Garth Bigness, MD  furosemide (LASIX) 20 MG tablet Take 0.5 tablets (10 mg total) by mouth daily. Patient taking differently: Take 20 mg by mouth daily.  12/10/17   Arthor Captain, PA-C  ibuprofen (ADVIL,MOTRIN) 200 MG tablet Take 400 mg by mouth as needed.    [provider]  Multiple Vitamins-Minerals  (CENTRUM SILVER 50+WOMEN PO) Take 1 tablet by mouth daily.    [provider]    Family History No family history on file.  Social History Social History   Tobacco Use  . Smoking status: Heavy Tobacco Smoker    Packs/day: 0.50  . Smokeless tobacco: Never Used  Substance Use Topics  . Alcohol use: No  . Drug use: No     Allergies   Codeine   Review of Systems Review of Systems  Constitutional: Positive for chills and fatigue. Negative for diaphoresis and fever.  HENT: Negative for congestion.   Eyes: Negative for visual disturbance.  Respiratory: Negative for cough, chest tightness, shortness of breath and wheezing.   Cardiovascular: Negative for chest pain and palpitations.  Gastrointestinal: Negative for abdominal pain, constipation, diarrhea, nausea and vomiting.  Genitourinary: Negative for dysuria and flank pain.  Musculoskeletal: Negative for back pain, neck pain and neck stiffness.  Skin: Positive for rash and wound.  Neurological: Negative for light-headedness and headaches.  Psychiatric/Behavioral: Negative for agitation.  All other systems reviewed and are negative.    Physical Exam Updated Vital Signs BP 133/78   Pulse (!) 106   Temp (!)  97.5 F (36.4 C) (Oral)   Resp 20   SpO2 99%   Physical Exam  Constitutional: She is oriented to person, place, and time. She appears well-developed and well-nourished. No distress.  HENT:  Head: Normocephalic and atraumatic.  Mouth/Throat: Oropharynx is clear and moist. No oropharyngeal exudate.  Eyes: Pupils are equal, round, and reactive to light. Conjunctivae and EOM are normal.  Neck: Neck supple.  Cardiovascular: Normal rate and regular rhythm.  No murmur heard. Pulmonary/Chest: Effort normal and breath sounds normal. No respiratory distress. She has no wheezes. She has no rales. She exhibits no tenderness.  Abdominal: Soft. There is no tenderness. There is no rebound.  Musculoskeletal: She exhibits  tenderness.       Right lower leg: She exhibits tenderness and edema.       Legs: See clinical photos.  Neurological: She is alert and oriented to person, place, and time. No sensory deficit. She exhibits normal muscle tone.  Skin: Skin is warm. Capillary refill takes less than 2 seconds. Rash noted. She is not diaphoretic. There is erythema.  Psychiatric: She has a normal mood and affect.  Nursing note and vitals reviewed.           ED Treatments / Results  Labs (all labs ordered are listed, but only abnormal results are displayed) Labs Reviewed  COMPREHENSIVE METABOLIC PANEL - Abnormal; Notable for the following components:      Result Value   Glucose, Bld 120 (*)    Creatinine, Ser 1.03 (*)    Albumin 3.3 (*)    GFR calc non Af Amer 55 (*)    All other components within normal limits  CBC WITH DIFFERENTIAL/PLATELET - Abnormal; Notable for the following components:   RBC 3.78 (*)    Hemoglobin 10.9 (*)    All other components within normal limits  I-STAT CG4 LACTIC ACID, ED    EKG None  Radiology No results found.  Procedures Procedures (including critical care time)  Medications Ordered in ED Medications  clindamycin (CLEOCIN) capsule 450 mg (has no administration in time range)  naproxen (NAPROSYN) tablet 500 mg (has no administration in time range)     Initial Impression / Assessment and Plan / ED Course  I have reviewed the triage vital signs and the nursing notes.  Pertinent labs & imaging results that were available during my care of the patient were reviewed by me and considered in my medical decision making (see chart for details).     [name] is a 67 y.o. female with a past medical history significant for bipolar disorder, atrial fibrillation, and chronic leg wounds who presents for acutely worsening right leg pain, redness, and foul smell developing on her wounds.  She reports that her right leg has had wounds for the last 8 months.  She  reports this been managed by the wound clinic.  She says that she came in several days ago when it started to hurt worsened and according to the documentation it did not appear infected at that time.  She reports that it was dressed and she went home.  She says the last 3 days it is continue to worsen.  Is now greater than 10 out of 10 in pain.  There is also redness streaking up her leg towards her knee.  She reports that it has been more purulent and the foul smell has been worsening.  She also reports "feeling off" and reports some fatigue.  She denies fever but reports  she may have had chills.  She denies nausea, vomiting, chest pain, shortness of breath, urinary or GI symptoms.  She says that she is concerned it is infected.  Next  On my exam, patient has significant wounds to her right leg.  The wounds are on the anterior lateral and posterior surface of the leg primarily.  There is green purulence covering it.  Patient does have a palpable pulse distally and has normal sensation distally.  Patient has erythema that is seen going up the leg proximally.  Patient reports that this is new.  Patient's lungs are clear and chest is nontender.  Patient's blood pressure was in the 90s on my evaluation.  Clinically I am very concerned about worsening cellulitis and infected chronic wound.  Patient had screening laboratory testing on triage that showed a normal lactic acid and a normal white blood cell count however clinically I am concerned this is a worsening infection.  X-rays were ordered to look for subcu pain is gas given the erythema and tenderness spreading proximally from the wound.  This would also look for bony involvement such as osteomyelitis.    Patient refused the x-ray and says she wants to leave the hospital and does not want to get admitted.  Patient was offered IV antibiotics and admission given the concern for rapidly worsening infection that could cause loss of limb or life.  Patient says  that she understands the risks and would rather take an oral antibiotic and go home.  Based on patient's report that she has taken Keflex in the past, patient will be given clindamycin.  Patient will still be leaving AGAINST MEDICAL ADVICE as I think she needs IV antibiotics, admission for wound management and discussion of possible amputation.    Patient understood the risks including losing her limb or death.  Patient will be discharged after wound is redressed and 1 dose of antibiotics is given.  1:08 PM Of note, documentation shows that patient has been discharged from her previous PCP for noncompliance.  Patient will be given community care wellness and wound care follow-up instructions.  Patient was also instructed to return to the emergency department at any point if she changes her mind.  Patient will leave AMA.   Final Clinical Impressions(s) / ED Diagnoses   Final diagnoses:  Cellulitis of right lower extremity    ED Discharge Orders         Ordered    clindamycin (CLEOCIN) 150 MG capsule  4 times daily     04/18/18 1312         Clinical Impression: 1. Cellulitis of right lower extremity     Disposition: AMA  Condition: Fair   New Prescriptions   No medications on file    Follow Up: No follow-up provider specified.    Tegeler, Canary Brim, MD 04/19/18 [id number]

## 2018-04-18 NOTE — ED Notes (Signed)
Patient ambulatory to bathroom with steady gait at this time Pt decided to leave AMA, physician aware, refusing to sign AMA form. Risks and benefits discussed with patient, verbalizes understanding.

## 2018-04-18 NOTE — ED Notes (Signed)
Patient verbalizes understanding of discharge instructions. Opportunity for questioning and answers were provided. Armband removed by staff, pt discharged from ED ambulatory.   

## 2018-04-18 NOTE — Discharge Instructions (Addendum)
You have worsening cellulitis of your right leg from your chronic wounds.  We were concerned enough to recommend IV antibiotics and admission.  You are leaving AGAINST MEDICAL ADVICE today.  We discussed the risk of losing your limb or your life with this decision.  You understand this risk by leaving.  Please take the antibiotic we are prescribing an attempt to try and treat the infection.  Please follow-up with both your PCP and the wound team.  If you change your mind or the symptoms worsen, please consider returning to the nearest emergency department for further management.

## 2018-04-18 NOTE — ED Notes (Addendum)
Patient refusing xray at this time, telling xray that all she wants is her leg wrapped and to go home. MD aware.

## 2018-04-18 NOTE — ED Triage Notes (Signed)
Patient complains of increased pain to right lower leg and also request dressing change to wound to leg, wound present x 8 months. Odor noted. States that she feels febrile and increased fatigue. Alert and oriented

## 2018-04-22 NOTE — Telephone Encounter (Signed)
Called APS to begin report for this patient 2/2 concern for risk for herself. Left VM and will await return call to clinic.

## 2018-04-23 ENCOUNTER — Encounter (HOSPITAL_COMMUNITY): Payer: Self-pay

## 2018-04-23 ENCOUNTER — Telehealth: Payer: Self-pay | Admitting: Family Medicine

## 2018-04-23 ENCOUNTER — Emergency Department (HOSPITAL_COMMUNITY)
Admission: EM | Admit: 2018-04-23 | Discharge: 2018-04-23 | Discharge status: Against Medical Advice | Payer: Medicare Other | Attending: Emergency Medicine | Admitting: Emergency Medicine

## 2018-04-23 DIAGNOSIS — F17200 Nicotine dependence, unspecified, uncomplicated: Secondary | ICD-10-CM | POA: Insufficient documentation

## 2018-04-23 DIAGNOSIS — Z5189 Encounter for other specified aftercare: Secondary | ICD-10-CM | POA: Diagnosis present

## 2018-04-23 DIAGNOSIS — Y33XXXD Other specified events, undetermined intent, subsequent encounter: Secondary | ICD-10-CM | POA: Diagnosis not present

## 2018-04-23 DIAGNOSIS — S81801D Unspecified open wound, right lower leg, subsequent encounter: Secondary | ICD-10-CM | POA: Insufficient documentation

## 2018-04-23 DIAGNOSIS — Z79899 Other long term (current) drug therapy: Secondary | ICD-10-CM | POA: Diagnosis not present

## 2018-04-23 NOTE — ED Provider Notes (Signed)
[facility] EMERGENCY DEPARTMENT Provider Note   CSN: [id number] Arrival date & time: 04/23/18  1026     History   Chief Complaint Chief Complaint  Patient presents with  . Wound Check    HPI [name] is a 67 y.o. female.  67 year old female presents for wound check right lower leg.  Patient states that she does not have time to stay in the emergency room today and is here for a dressing change.  Patient states that she was having in home care however her allowed number of visits per her insurance ran up and is not coming to the emergency room for her dressing changes.  Patient states that she was seen in the emergency room a few days ago, states the leg is feeling better and in her opinion looking better.  She denies fevers.  Patient denies history of diabetes.  Patient is open to social work assistance however states she will not stay in the emergency room and up for this and follow-up after she leaves today.  No other complaints or concerns today.     Past Medical History:  Diagnosis Date  . Arthritis   . Hypoglycemia   . Pneumonia, pneumococcal [facility])     Patient Active Problem List   Diagnosis Date Noted  . Wound of right leg 02/06/2018  . Atrial fibrillation (HCC) 01/03/2018  . Anemia 01/03/2018  . Elevated serum creatinine 01/03/2018  . Bipolar I disorder with mania (HCC) 01/03/2018    Past Surgical History:  Procedure Laterality Date  . ABDOMINAL HYSTERECTOMY    . CESAREAN SECTION       OB History    Gravida      Para      Term      Preterm      AB      Living  3     SAB      TAB      Ectopic      Multiple      Live Births               Home Medications    Prior to Admission medications   Medication Sig Start Date End Date Taking? Authorizing Provider  clindamycin (CLEOCIN) 150 MG capsule Take 3 capsules (450 mg total) by mouth 4 (four) times daily for 7 days. 04/18/18 04/25/18  Tegeler, Canary Brim, MD    furosemide (LASIX) 20 MG tablet Take 0.5 tablets (10 mg total) by mouth daily. Patient not taking: Reported on 04/18/2018 12/10/17   Arthor Captain, PA-C  ibuprofen (ADVIL,MOTRIN) 200 MG tablet Take 400 mg by mouth as needed for moderate pain.     [provider]  Multiple Vitamins-Minerals (CENTRUM SILVER 50+WOMEN PO) Take 1 tablet by mouth daily.    [provider]  naproxen sodium (ALEVE) 220 MG tablet Take 220 mg by mouth daily as needed (pain).     [provider]    Family History No family history on file.  Social History Social History   Tobacco Use  . Smoking status: Heavy Tobacco Smoker    Packs/day: 0.50  . Smokeless tobacco: Never Used  Substance Use Topics  . Alcohol use: No  . Drug use: No     Allergies   Codeine   Review of Systems Review of Systems  Constitutional: Negative for fever.  Musculoskeletal: Positive for myalgias. Negative for arthralgias, gait problem and joint swelling.  Skin: Positive for wound.  Allergic/Immunologic: Negative for  immunocompromised state.  Neurological: Negative for weakness and numbness.  Hematological: Does not bruise/bleed easily.  Psychiatric/Behavioral: Negative for confusion.  All other systems reviewed and are negative.    Physical Exam Updated Vital Signs BP 118/60   Pulse 78   Temp 98.5 F (36.9 C) (Oral)   Resp 18   SpO2 98%   Physical Exam  Constitutional: She is oriented to person, place, and time. She appears well-developed and well-nourished. No distress.  HENT:  Head: Normocephalic and atraumatic.  Cardiovascular: Intact distal pulses.  Pulmonary/Chest: Effort normal.  Musculoskeletal: She exhibits tenderness.       Legs: Neurological: She is alert and oriented to person, place, and time.  Skin: Skin is warm and dry. She is not diaphoretic. There is erythema.  Psychiatric: She has a normal mood and affect. Her behavior is normal.  Nursing note and vitals  reviewed.    ED Treatments / Results  Labs (all labs ordered are listed, but only abnormal results are displayed) Labs Reviewed - No data to display  EKG None  Radiology No results found.  Procedures Procedures (including critical care time)  Medications Ordered in ED Medications - No data to display   Initial Impression / Assessment and Plan / ED Course  I have reviewed the triage vital signs and the nursing notes.  Pertinent labs & imaging results that were available during my care of the patient were reviewed by me and considered in my medical decision making (see chart for details).  Clinical Course as of Apr 24 1119  Wed Apr 23, 2018  [id number] 67 year old female the emergency room for a dressing change.  Patient seen previously for this wound, refused work-up at that time.  Patient states that she is here today only for a dressing changes does not wish for further evaluation. Patient understands her leg is infected, she is taking antibiotics, she understands infection could lead to loss of her leg although she is optimistic that her leg is healing and this will not be the case for her.  Patient is of sound mind today and able to make the decision to leave AGAINST MEDICAL ADVICE.  She understands that she may return to the emergency room at any time.  Have requested case management follow-up for this patient who is agreeable to social worker follow-up however states she does not have time to stay today for this assistance.   [LM]    Clinical Course User Index [LM] Jeannie FendMurphy, Kealan Buchan A, PA-C    Final Clinical Impressions(s) / ED Diagnoses   Final diagnoses:  Visit for wound check    ED Discharge Orders    None       Jeannie FendMurphy, Sherrell Farish A, PA-C 04/23/18 1120    Cathren LaineSteinl, Kevin, MD 04/24/18 1055

## 2018-04-23 NOTE — Telephone Encounter (Signed)
Called APS again to place report, left another VM. If they call back, I'm in clinic this am, but optho away from clinic this pm.

## 2018-04-23 NOTE — ED Triage Notes (Signed)
Pt comes to ed for wound check and dressing change. Pt has had wound for 8 months. Pt stopped going to wound clinic because "they were not sterile".

## 2018-04-30 NOTE — Telephone Encounter (Signed)
Filed APS report, they will send letter for follow up.

## 2018-05-01 ENCOUNTER — Encounter: Payer: Self-pay | Admitting: Family Medicine

## 2018-05-01 ENCOUNTER — Other Ambulatory Visit: Payer: Self-pay

## 2018-05-01 ENCOUNTER — Ambulatory Visit (INDEPENDENT_AMBULATORY_CARE_PROVIDER_SITE_OTHER): Payer: Medicare Other | Admitting: Family Medicine

## 2018-05-01 VITALS — BP 112/64 | HR 66 | Temp 98.0°F | Ht 63.0 in | Wt 168.4 lb

## 2018-05-01 DIAGNOSIS — S81801A Unspecified open wound, right lower leg, initial encounter: Secondary | ICD-10-CM | POA: Diagnosis present

## 2018-05-01 NOTE — Assessment & Plan Note (Signed)
Does not appear infected today. S/p clinda recently. Wrapped with tefla and ACE. Made patient RN clinic appt next week to re wrap leg and replaced HH referral to see if another company will see her. Differential includes vascular insuffiency vs pyoderma gangrenosum. Patient states unwilling to see Derm or wound care clinic. Could consider [facility] derm clinic for biopsy in the future.

## 2018-05-01 NOTE — Patient Instructions (Addendum)
It was a pleasure to see you today! Thank you for choosing [facility] for your primary care. [name] was seen for leg pain and wound.   Our plans for today were:  Leave the wrappings on.  Return next week for wound care check.  If you have fevers or feel unwell or other concerns go to the emergency room for wound treatment.  Call us with any questions.  I will place a new home health referral. Wounds anytime no not me like you are doing more nurse chronic right  Best,  Dr. Chanetta Marshall

## 2018-05-01 NOTE — Progress Notes (Signed)
   CC: leg wounds  HPI  Patient is tangential today and hx not consistent with my previous discussions with [facility] RN.   R leg wound -  Completed clindamycin from ED, brings bottle today. Script was from Dr. Rush Landmark on 8/23. States L leg is completely healed. She says ED MD told her to ask about increasing dose of clindamycin, but "I dont want to knock me out". Hasn't had any supplies for her leg, she has been taking care of it herself. States she got hives from tylenol in the ED (no documentation of this) HH - she states the [facility] RN quit. States this was because she "was done." when questioned further about [facility] team telling me they discharged her, she States she had a verbal altercation with Tameka 2/2 tameka not using gloves, but im not sure. wants new HH referral.  Doesn't want to go to the wound care center. "i'm not going there since it turned gangrene and someone turned him in and not me". "I want to be knocked out if they do scraping and stuff." Went to wound care once or "couple times" on [place]. (no documentation of this) No fever. Feeling well overall. Denies ever having a biopsy of leg lesion. Is painful to touch.   ROS: Denies CP, SOB, abdominal pain, dysuria, changes in BMs.   CC, SH/smoking status, and VS noted  Objective: BP 112/64   Pulse 66   Temp 98 F (36.7 C)   Ht 5\' 3"  (1.6 m)   Wt 168 lb 6.4 oz (76.4 kg)   SpO2 97%   BMI 29.83 kg/m  Gen: NAD, alert, unkempt, malodorous.  HEENT: NCAT, EOMI, PERRL CV: RRR, no murmur Resp: CTAB, no wheezes, non-labored Ext: R leg TTP, unable to appreciate pulses. Warm surrounding wound.      Neuro: Alert and oriented, Speech clear, No gross deficits  Assessment and plan:  Wound of right leg Does not appear infected today. S/p clinda recently. Wrapped with tefla and ACE. Made patient RN clinic appt next week to re wrap leg and replaced HH referral to see if another company will see her. Differential includes vascular insuffiency vs  pyoderma gangrenosum. Patient states unwilling to see Derm or wound care clinic. Could consider [facility] derm clinic for biopsy in the future.    Orders Placed This Encounter  Procedures  . Ambulatory referral to Home Health    Referral Priority:   Routine    Referral Type:   Home Health Care    Referral Reason:   Specialty Services Required    Requested Specialty:   Home Health Services    Number of Visits Requested:   1    Needs provider follow up after RN visit (1 week after).   No orders of the defined types were placed in this encounter.   Loni Muse, MD, PGY3 05/01/2018 9:05 PM

## 2018-05-05 ENCOUNTER — Ambulatory Visit: Payer: Medicare Other

## 2018-05-05 ENCOUNTER — Telehealth: Payer: Self-pay | Admitting: Family Medicine

## 2018-05-05 NOTE — Telephone Encounter (Signed)
Patient says she will try and get a ride tomorrow for dressing changes. Informed her to notify clinic ASAP so she can be added to schedule given her prior appt has been canceled. Advised patient to reschedule at next available appt if not available tomorrow.  Durward Parcel, DO [facility], PGY-3

## 2018-05-05 NOTE — Telephone Encounter (Signed)
Pt called and wanted Dr. Chanetta Marshall know she had to cancel her nurses appointment scheduled for tomorrow 05/06/18. She has fallen and having a hard time walking. She would like Dr. Chanetta Marshall to call her.

## 2018-05-06 ENCOUNTER — Emergency Department (HOSPITAL_COMMUNITY)
Admission: EM | Admit: 2018-05-06 | Discharge: 2018-05-06 | Disposition: A | Discharge status: Home / Self Care | Payer: Medicare Other | Attending: Emergency Medicine | Admitting: Emergency Medicine

## 2018-05-06 ENCOUNTER — Emergency Department (HOSPITAL_COMMUNITY): Payer: Medicare Other

## 2018-05-06 ENCOUNTER — Ambulatory Visit: Payer: Medicare Other

## 2018-05-06 ENCOUNTER — Other Ambulatory Visit: Payer: Self-pay

## 2018-05-06 ENCOUNTER — Encounter (HOSPITAL_COMMUNITY): Payer: Self-pay

## 2018-05-06 DIAGNOSIS — F17210 Nicotine dependence, cigarettes, uncomplicated: Secondary | ICD-10-CM | POA: Insufficient documentation

## 2018-05-06 DIAGNOSIS — W19XXXA Unspecified fall, initial encounter: Secondary | ICD-10-CM

## 2018-05-06 DIAGNOSIS — Z79899 Other long term (current) drug therapy: Secondary | ICD-10-CM | POA: Diagnosis not present

## 2018-05-06 DIAGNOSIS — R52 Pain, unspecified: Secondary | ICD-10-CM | POA: Insufficient documentation

## 2018-05-06 DIAGNOSIS — M549 Dorsalgia, unspecified: Secondary | ICD-10-CM | POA: Diagnosis present

## 2018-05-06 DIAGNOSIS — M7918 Myalgia, other site: Secondary | ICD-10-CM

## 2018-05-06 LAB — CBC WITH DIFFERENTIAL/PLATELET
Abs Immature Granulocytes: 0 K/uL (ref 0.0–0.1)
Basophils Absolute: 0.1 K/uL (ref 0.0–0.1)
Basophils Relative: 1 %
Eosinophils Absolute: 0.3 K/uL (ref 0.0–0.7)
Eosinophils Relative: 5 %
HCT: 35.9 % — ABNORMAL LOW (ref 36.0–46.0)
Hemoglobin: 11 g/dL — ABNORMAL LOW (ref 12.0–15.0)
Immature Granulocytes: 0 %
Lymphocytes Relative: 26 %
Lymphs Abs: 1.4 K/uL (ref 0.7–4.0)
MCH: 29.2 pg (ref 26.0–34.0)
MCHC: 30.6 g/dL (ref 30.0–36.0)
MCV: 95.2 fL (ref 78.0–100.0)
Monocytes Absolute: 0.6 K/uL (ref 0.1–1.0)
Monocytes Relative: 11 %
Neutro Abs: 3.1 K/uL (ref 1.7–7.7)
Neutrophils Relative %: 57 %
Platelets: 226 K/uL (ref 150–400)
RBC: 3.77 MIL/uL — ABNORMAL LOW (ref 3.87–5.11)
RDW: 15.1 % (ref 11.5–15.5)
WBC: 5.4 K/uL (ref 4.0–10.5)

## 2018-05-06 LAB — BASIC METABOLIC PANEL WITH GFR
Anion gap: 12 (ref 5–15)
BUN: 17 mg/dL (ref 8–23)
CO2: 24 mmol/L (ref 22–32)
Calcium: 9.5 mg/dL (ref 8.9–10.3)
Chloride: 109 mmol/L (ref 98–111)
Creatinine, Ser: 0.98 mg/dL (ref 0.44–1.00)
GFR calc Af Amer: >60 mL/min
GFR calc non Af Amer: 58 mL/min — ABNORMAL LOW
Glucose, Bld: 93 mg/dL (ref 70–99)
Potassium: 4.8 mmol/L (ref 3.5–5.1)
Sodium: 145 mmol/L (ref 135–145)

## 2018-05-06 LAB — I-STAT CG4 LACTIC ACID, ED: Lactic Acid, Venous: 0.96 mmol/L (ref 0.5–1.9)

## 2018-05-06 NOTE — ED Notes (Signed)
Patient transported to X-ray 

## 2018-05-06 NOTE — Discharge Instructions (Addendum)
You were seen in the ER for left buttock pain after fall.   X-ray of low spine and left hip/pelvis did not show any new fractures.    We changed dressing of your wound today.  Case management/social work has set up home health wound nurse.   Return for worsening hip/buttock pain, groin numbness, loss of bladder or bowel control, worsening redness pain drainage or odor to your wound.

## 2018-05-06 NOTE — ED Notes (Signed)
XR called to report that pt is refusing lumbar XR

## 2018-05-06 NOTE — Discharge Planning (Signed)
Charla Criscione J. Lucretia Roers, RN, BSN, [employer] [phone] Spoke with pt at bedside regarding discharge planning for [facility]. Offered pt list of home health agencies to choose from.  Pt chose [facility] to render services as she has had them in the past. Eugenio Hoes of [facility] notified. Patient made aware that [facility] will be in contact in 24-48 hours.  No DME needs identified at this time.

## 2018-05-06 NOTE — ED Triage Notes (Signed)
Pt arrived from home via [facility], states that she had a fall on Friday, denies LOC, denies hitting her head. States that she wants an XR for her left sided pelvic pain. Pt also has wound to right leg X1 year.

## 2018-05-06 NOTE — ED Provider Notes (Signed)
[facility] EMERGENCY DEPARTMENT Provider Note   CSN: [id number] Arrival date & time: 05/06/18  1100     History   Chief Complaint Chief Complaint  Patient presents with  . Fall    HPI [name] is a 67 y.o. female is here for evaluation of back pain onset Friday after mechanical fall. Pain is in left hip/buttock, non radiating, moderate, intermittent, aggravated with palpation and movement.  Minimal pain with sitting still or not moving. No interventions for this. States her landlord was "rushing her" to clean her house and she fell backwards landing on her buttocks.  She has been ambulatory at baseline with cane with mild pain.  Denies groin numbness, bowel incontinence or retention. Long h/o intermittent urinary incontinence that is not new.  No numbness, tingling to extremities. Additionally, it is noted pt has malodorous right leg wound.  States she has long h/o of this. She refuses to go to wound clinic because they scrape her wound without giving her something for pain.  States her last home wound RN quit and stopped coming for dressing changes.  She has been coming to ER for wound check and dressing change.  She finished clindamycin 2 weeks ago. She states her wounds in her RLE are looking better, still with odor but less redness, warmth and swelling than before. No drainage. No worsening erythema. No fevers. Her left leg wound completely healed.    HPI  Past Medical History:  Diagnosis Date  . Arthritis   . Hypoglycemia   . Pneumonia, pneumococcal (HCC)     Patient Active Problem List   Diagnosis Date Noted  . Wound of right leg 02/06/2018  . Atrial fibrillation (HCC) 01/03/2018  . Anemia 01/03/2018  . Elevated serum creatinine 01/03/2018  . Bipolar I disorder with mania (HCC) 01/03/2018    Past Surgical History:  Procedure Laterality Date  . ABDOMINAL HYSTERECTOMY    . CESAREAN SECTION       OB History    Gravida      Para      Term     Preterm      AB      Living  3     SAB      TAB      Ectopic      Multiple      Live Births               Home Medications    Prior to Admission medications   Medication Sig Start Date End Date Taking? Authorizing Provider  furosemide (LASIX) 20 MG tablet Take 0.5 tablets (10 mg total) by mouth daily. Patient not taking: Reported on 04/18/2018 12/10/17   Harris, Abigail, PA-C  ibuprofen (ADVIL,MOTRIN) 200 MG tablet Take 400 mg by mouth as needed for moderate pain.     [provider]  Multiple Vitamins-Minerals (CENTRUM SILVER 50+WOMEN PO) Take 1 tablet by mouth daily.    [provider]  naproxen sodium (ALEVE) 220 MG tablet Take 220 mg by mouth daily as needed (pain).     [provider]    Family History No family history on file.  Social History Social History   Tobacco Use  . Smoking status: Heavy Tobacco Smoker    Packs/day: 0.50  . Smokeless tobacco: Never Used  Substance Use Topics  . Alcohol use: No  . Drug use: No     Allergies   Codeine   Review of Systems Review of Systems    Musculoskeletal: Positive for arthralgias.  Skin: Positive for wound.  All other systems reviewed and are negative.    Physical Exam Updated Vital Signs BP 122/64 (BP Location: Right Arm)   Pulse 100   Resp 18   Ht 5' 3" (1.6 m)   Wt 76.3 kg   SpO2 97%   BMI 29.80 kg/m   Physical Exam  Constitutional: She appears well-developed and well-nourished. No distress.  NAD.  HENT:  Head: Normocephalic and atraumatic.  Right Ear: External ear normal.  Left Ear: External ear normal.  Nose: Nose normal.  Eyes: Conjunctivae and EOM are normal.  Neck: Normal range of motion. Neck supple.  Cardiovascular: Normal rate, regular rhythm and normal heart sounds.  No murmur heard. Pulmonary/Chest: Effort normal and breath sounds normal.  Musculoskeletal: Normal range of motion. She exhibits tenderness. She exhibits no deformity.  T spine: no  midline tenderness L spine: no midline tenderness. L sided mild muscular tenderness. TTP to left SI joint, left buttock and sciatic notch.  No overlying ecchymosis.  Pelvis: no instability with AP/L compression. Full hip flexion, extension, rotation without pain. No leg shortening or rotation. Ankles: no focal bony tenderness, full PROM of ankles without pain.   Neurological: She is alert.  Sensation to light touch intact in feet bilaterally. 5/5 dorsiflexion/extension.   Skin: Skin is warm and dry. Capillary refill takes less than 2 seconds.  Total of 3 wounds to right lower leg (anterior, lateral, posterior).  Appropriately tender.  Circumferential erythema without streaking or extension into foot or upper leg. Malodorous. Moist but no drainage. See pictures.  Psychiatric: She has a normal mood and affect. Her behavior is normal. Judgment and thought content normal.  Nursing note and vitals reviewed.          ED Treatments / Results  Labs (all labs ordered are listed, but only abnormal results are displayed) Labs Reviewed  CBC WITH DIFFERENTIAL/PLATELET - Abnormal; Notable for the following components:      Result Value   RBC 3.77 (*)    Hemoglobin 11.0 (*)    HCT 35.9 (*)    All other components within normal limits  BASIC METABOLIC PANEL - Abnormal; Notable for the following components:   GFR calc non Af Amer 58 (*)    All other components within normal limits  I-STAT CG4 LACTIC ACID, ED  I-STAT CG4 LACTIC ACID, ED    EKG None  Radiology Dg Lumbar Spine Complete  Result Date: 05/06/2018 CLINICAL DATA:  Left buttock pain after fall. EXAM: LUMBAR SPINE - COMPLETE 4+ VIEW COMPARISON:  Lumbar spine x-rays dated May 12, 2007. FINDINGS: Five lumbar type vertebral bodies. No acute fracture or subluxation. Vertebral body heights are preserved. Mildly increased now 4 mm anterolisthesis at L5-S1. Intervertebral disc spaces are maintained. Moderate bilateral facet arthropathy  at L4-L5 and L5-S1. Osteopenia. The sacroiliac joints are unremarkable. Aortoiliac atherosclerotic vascular disease. IMPRESSION: 1. No acute osseous abnormality. 2. Moderate lower lumbar facet arthropathy with slightly increased grade 1 anterolisthesis at L5-S1. Electronically Signed   By: William T Derry M.D.   On: 05/06/2018 14:16   Dg Tibia/fibula Right  Result Date: 05/06/2018 CLINICAL DATA:  Chronic open wound EXAM: RIGHT TIBIA AND FIBULA - 2 VIEW COMPARISON:  None. FINDINGS: Frontal and lateral views were obtained. There soft tissue defects anterior to the junction of the mid and distal thirds of the tibia. There is no associated periosteal reaction or radiopaque foreign body. No fracture or dislocation. No erosive change or   bony destruction. Joint spaces appear unremarkable. IMPRESSION: Soft tissue abnormality anterior to the junction of the mid and distal thirds of the tibia. No radiopaque foreign body. No abnormal periosteal reaction or bony destruction. No fracture or dislocation. No evident arthropathy. Electronically Signed   By: William  Woodruff III M.D.   On: 05/06/2018 14:16   Dg Hip Unilat W Or Wo Pelvis 2-3 Views Left  Result Date: 05/06/2018 CLINICAL DATA:  Pain following fall EXAM: DG HIP (WITH OR WITHOUT PELVIS) 2-3V LEFT COMPARISON:  None. FINDINGS: Frontal pelvis as well as frontal and lateral right hip images were obtained. There is no evident acute fracture or dislocation. There is mild symmetric narrowing of both hip joints. No erosive changes. There is a small radiopaque foreign body in the soft tissues lateral to the proximal femoral diaphysis on the left. IMPRESSION: Small palate foreign body in the soft tissues laterally on the left at the level of the proximal left femoral diaphysis. No fracture or dislocation. Symmetric narrowing each hip joint. No erosive change. Electronically Signed   By: William  Woodruff III M.D.   On: 05/06/2018 14:14    Procedures Procedures  (including critical care time)  Medications Ordered in ED Medications - No data to display   Initial Impression / Assessment and Plan / ED Course  I have reviewed the triage vital signs and the nursing notes.  Pertinent labs & imaging results that were available during my care of the patient were reviewed by me and considered in my medical decision making (see chart for details).  Clinical Course as of May 07 1807  Tue May 06, 2018  1442 IMPRESSION: 1. No acute osseous abnormality. 2. Moderate lower lumbar facet arthropathy with slightly increased grade 1 anterolisthesis at L5-S1.    DG Lumbar Spine Complete [CG]  1442 IMPRESSION: Soft tissue abnormality anterior to the junction of the mid and distal thirds of the tibia. No radiopaque foreign body. No abnormal periosteal reaction or bony destruction. No fracture or dislocation. No evident arthropathy.    DG Tibia/Fibula Right [CG]  1443  IMPRESSION: Small palate foreign body in the soft tissues laterally on the left at the level of the proximal left femoral diaphysis. No fracture or dislocation. Symmetric narrowing each hip joint. No erosive change.    DG Hip Unilat W or Wo Pelvis 2-3 Views Left [CG]    Clinical Course User Index [CG] ,  J, PA-C   L-spine and pelvis/hip x-rays without bony fx.  Noted radiopaque foreign body in left hip.  I asked pt to look at her skin to the front of her hip but she declined, she was in a hall bed and she did not want to take her pants off.  I was able to see posterior L-spine and buttocks and no ecchymosis, foreign bodies on skin. . She has no s/s of cauda equina. Lower extremities NVI. She has no radicular symptoms.  She is ambulatory in ER with cane.  I do not think further lab/imaging indicated. Will dc with NSAID, f/u with PCP for persistent pain.   In regards to noted chronic wound, no signs of infection when compared to previous photos on chart.  The wound is malodorous  but pt has overall poor hygiene  There is no drainage, fluctuance, or streaking or erythema. Afebrile. WBC and lactic WNL. X-ray without sq air.  Per pt, her wound is actually getting better. She finished clindamycin 2 weeks ago.  It does not look like there is   recurrent infection currently but she is certainly high risk for it. CM/SW met pt at bedside and have assisted with HH RN/wound care.  Face to face ordered by me.  Discussed plan to dc pt with wound care nurse at home. She is in agreement. She is eager for DC. Discussed return precautions.   Final Clinical Impressions(s) / ED Diagnoses   Final diagnoses:  Fall, initial encounter  Left buttock pain    ED Discharge Orders    None       ,  J, PA-C 05/06/18 1808    Knapp, Jon, MD 05/07/18 1520  

## 2018-05-08 NOTE — Telephone Encounter (Signed)
The  [facility] home nurse Sherri Cobb called requesting orders and for the doctor to call patient. The patient would not let the nurse properly change her dressing. She said that she just want the same ointment that the doctor used in the office. Sherri was trying to clean the wound and put silver on the would with fresh dressing but patient said that she would get gangrene like the before when another wound care office used this on her. Sherri is requesting orders for what she is going to be putting on patients wound. She would also like the doctor to call patient and explain that the silver will help her wound heal better. If the doctor would like the nurse to do a different treatment please send the orders in stating what she would like her to do. Please call with verbal or to discuss what she should be doing at [phone]. You [facility] also [facility] [phone]

## 2018-05-12 ENCOUNTER — Telehealth: Payer: Self-pay

## 2018-05-12 NOTE — Telephone Encounter (Signed)
Marchelle FolksAmanda, nurse with [facility], left VM on nurse line requesting orders for wound care. Marchelle Folksmanda went out this am to check on pt RLE wound, pt presented with warmth and purulent drainage, despite finishing a round of antibiotics. Please leave new orders with Marchelle FolksAmanda [phone]. You may leave a detailed VM.

## 2018-05-12 NOTE — Telephone Encounter (Signed)
Called HH RN, left VM. If nurse is concerned with infection, patient needs to be seen. Asked RN to call back. HH orders are: telfa dressings to be changed twice per week wrapped with coban and ace. Please call patient as well and try to get her an appt this week.

## 2018-05-15 NOTE — Telephone Encounter (Signed)
Contacted pt at home number which is the office of where she lives.  I then called and LVM on cell phone to call office back to see about getting her an appointment scheduled per Dr. Chanetta Marshallimberlake. Lamonte SakaiZimmerman Rumple, [name], [place]

## 2018-05-30 ENCOUNTER — Telehealth: Payer: Self-pay | Admitting: Family Medicine

## 2018-05-30 NOTE — Telephone Encounter (Signed)
Clinical info completed on PCS form.  Place form in Dr. Christena Flake box for completion.  [name], [name], [place]

## 2018-05-30 NOTE — Telephone Encounter (Signed)
Independent Assessment for person care services  form dropped off for at front desk for completion.  Verified that patient section of form has been completed.  Last DOS/WCC with PCP was 05/01/18.  Placed form in [facility] team folder to be completed by clinical staff.  Lina Sar

## 2018-06-04 NOTE — Telephone Encounter (Signed)
Form faxed to [facility] at [phone]. Placed in batch scanning. Ples Specter, RN [facility] RN)

## 2018-06-04 NOTE — Telephone Encounter (Signed)
Completed form and returned to RN box. 

## 2018-07-22 ENCOUNTER — Telehealth: Payer: Self-pay | Admitting: Family Medicine

## 2018-07-22 NOTE — Telephone Encounter (Signed)
Will forward to Dr. Chanetta Marshallimberlake to give us the verbal ok to call for wound care.  Berish Bohman,CMA

## 2018-07-22 NOTE — Telephone Encounter (Signed)
Verbal order is ok per me. Continue wound care.

## 2018-07-22 NOTE — Telephone Encounter (Signed)
Lanora ManisElizabeth RN from [facility] is calling concerning pt wanting to continue wound care. Lanora Manislizabeth said the orders could be faxed or a nurse could call her for verbal orders. The best call back number is [phone].

## 2018-07-23 NOTE — Telephone Encounter (Signed)
Left message for verbal ok for wound care for Caroline CaffeySusan Phillips. [name], CMA

## 2018-08-02 ENCOUNTER — Other Ambulatory Visit: Payer: Self-pay | Admitting: Family Medicine

## 2018-08-02 MED ORDER — DOXYCYCLINE HYCLATE 100 MG PO TABS: 100.0000 mg | ORAL_TABLET | Freq: Two times a day (BID) | ORAL | 0 refills | Status: DC

## 2018-08-02 NOTE — Progress Notes (Signed)
Called on telephone line and informed by home health nurse that her chronic leg wound was red, warm, irrigated and with possible purulence. Home health nurse did not feel the need to come to the ED as she was not febrile or in pain.  Starting Doxycycline for empiric coverage. Informed home health nurse if this worsens to go to urgent care. Otherwise, please follow up in our clinic to ensure treatment is working.   Doxycycline 100mg  BID for 5 days

## 2018-08-02 NOTE — Progress Notes (Signed)
Sending to [facility] on [address]

## 2018-08-21 ENCOUNTER — Ambulatory Visit (INDEPENDENT_AMBULATORY_CARE_PROVIDER_SITE_OTHER): Payer: Medicare Other | Admitting: Family Medicine

## 2018-08-21 ENCOUNTER — Other Ambulatory Visit: Payer: Self-pay

## 2018-08-21 ENCOUNTER — Encounter: Payer: Self-pay | Admitting: Family Medicine

## 2018-08-21 VITALS — BP 104/62 | HR 105 | Temp 98.0°F | Ht 63.0 in | Wt 179.0 lb

## 2018-08-21 DIAGNOSIS — S81801D Unspecified open wound, right lower leg, subsequent encounter: Secondary | ICD-10-CM | POA: Diagnosis present

## 2018-08-21 NOTE — Patient Instructions (Signed)
It was a pleasure to see you today! Thank you for choosing [facility] for your primary care. [name] was seen for leg wounds.   Our plans for today were:  No signs of infection today.   Please think about whether we could do the ultrasound study in the future.   Call if worsening.   You should return to our clinic to see Dr. Chanetta Marshallimberlake in 1 month for recheck legs.   Best,  Dr. Chanetta Marshallimberlake

## 2018-08-21 NOTE — Progress Notes (Signed)
   CC: f/u leg wounds  HPI  Leg wounds - reports L leg is still healed, she rests when it hurts. She wants to start a high protein diet to help the R heal. No recent fever. Reports she still has a bump from wound care a year ago. No recent fever. HH RN was at her house last week. Using petroleum sheet dressings. She reports HH RN does not have concerns at present.   Flu shot at the emergency room per her report (I dont see this)  Refuses mammogram and coloscopy again.   Refuses vascular referral as well as ABIs.   ROS: Denies CP, SOB, abdominal pain, dysuria, changes in BMs.   CC, SH/smoking status, and VS noted  Objective: BP 104/62   Pulse (!) 105   Temp 98 F (36.7 C) (Oral)   Ht 5\' 3"  (1.6 m)   Wt 179 lb (81.2 kg)   SpO2 99%   BMI 31.71 kg/m  Gen: NAD, alert, calm.  HEENT: NCAT, EOMI, PERRL CV: RRR, no murmur Resp: CTAB, no wheezes, non-labored RLE: large wounds (one anterior 6x7cm, one medial 5x6 cm and one posterior 3x4cm) with sharply demarcated and hyperpigmented borders, granulomatous tissue overlying, not warm. Nontender.  Neuro: Alert and oriented, Speech clear, No gross deficits  Assessment and plan:  Chronic right leg wound: No signs of superficial infection today.  Leg appears similar to our visit in September, although about 1 cm smaller in each diameter and more moist with more granulomatous tissue.  Continue home health and bandaging.  Patient refuses vascular referral as well as ABIs to assess vascular flow.  Health Maintenance reviewed -patient refuses colonoscopy, mammogram, flu shot.  Loni MuseKate Timberlake, MD, PGY3 08/21/2018 9:30 AM

## 2018-10-17 ENCOUNTER — Telehealth: Payer: Self-pay | Admitting: *Deleted

## 2018-10-17 DIAGNOSIS — S81801A Unspecified open wound, right lower leg, initial encounter: Secondary | ICD-10-CM

## 2018-10-17 NOTE — Telephone Encounter (Signed)
Pt has recently been d/c from [facility] and called [facility] to request services from them.  Calvin @ [facility] is requesting a Home health referral for wound care.  Doll Frazee, Maryjo Rochester, CMA

## 2018-10-20 NOTE — Telephone Encounter (Signed)
Referral placed.

## 2018-10-21 ENCOUNTER — Telehealth: Payer: Self-pay

## 2018-10-21 NOTE — Telephone Encounter (Signed)
Sam, patients friend, called nurse line to schedule an apt for the apt. Apt made.

## 2018-10-23 ENCOUNTER — Telehealth: Payer: Self-pay | Admitting: *Deleted

## 2018-10-23 NOTE — Telephone Encounter (Signed)
Olegario Messier wants to let Dr. Chanetta Marshall know that she has went out to see patient but she fears that the patient will not let her take care of the wound like she needs to.  She will continue to go out, but just wants to make MD aware. Bianna Haran, Maryjo [place], CMA

## 2018-10-27 MED ORDER — COLLAGENASE 250 UNIT/GM EX OINT: 1.0000 "application " | TOPICAL_OINTMENT | Freq: Every day | CUTANEOUS | 0 refills | Status: DC

## 2018-10-27 NOTE — Addendum Note (Signed)
Addended by: Shon Hale on: 10/27/2018 04:17 PM   Modules accepted: Orders

## 2018-10-27 NOTE — Telephone Encounter (Signed)
[name] went out today and was able to address wounds that has declined. She silver calcium alginate and PolyMem silver on the wound.  Both wounds are slough.  She is requesting a script for santyl ointment sent to her pharmacy.  Increased frequency to 3 times week.  Fleeger, Maryjo [place], CMA

## 2018-10-27 NOTE — Telephone Encounter (Signed)
Santyl sent to [facility]. Appreciate the help from wound care.

## 2018-10-28 ENCOUNTER — Encounter: Payer: Self-pay | Admitting: Family Medicine

## 2018-10-28 ENCOUNTER — Ambulatory Visit (INDEPENDENT_AMBULATORY_CARE_PROVIDER_SITE_OTHER): Payer: Medicare Other | Admitting: Family Medicine

## 2018-10-28 ENCOUNTER — Other Ambulatory Visit: Payer: Self-pay

## 2018-10-28 DIAGNOSIS — L602 Onychogryphosis: Secondary | ICD-10-CM

## 2018-10-28 DIAGNOSIS — S81801D Unspecified open wound, right lower leg, subsequent encounter: Secondary | ICD-10-CM | POA: Diagnosis not present

## 2018-10-28 NOTE — Assessment & Plan Note (Signed)
Patient's right toenails are quite overgrown and rubbing each other.  Offered podiatry referral for trimming.  She states she does not want to see any additional doctors and will let me know if this changes.

## 2018-10-28 NOTE — Progress Notes (Signed)
   CC: leg wound   HPI  Patient presents today for follow-up of the right lower extremity wound.  She does not want to unwrap it today, but states it is doing better.  She has home health going to her house a couple of times a week.  She states her right foot is a little bit swollen because it is healing.  She is ambulating easily.  No pain.  No recent fevers.  She continues to smoke.  She states she wants to stop and is going to use walking to help.  She thinks she needs albuterol because she hears herself wheezing.  Denies a history of asthma or COPD.  ROS: Denies CP, SOB, abdominal pain, dysuria, changes in BMs.   CC, SH/smoking status, and VS noted  Objective: BP 128/88 (BP Location: Left Wrist, Patient Position: Sitting, Cuff Size: Normal)   Pulse (!) 105   Temp 98 F (36.7 C) (Oral)   Ht 5\' 3"  (1.6 m)   Wt 181 lb (82.1 kg)   SpO2 92%   BMI 32.06 kg/m  Gen: NAD, alert, cooperative. HEENT: NCAT, EOMI, PERRL CV: RRR, no murmur Resp: CTAB, no wheezes, non-labored Ext: No edema, warm.  Right lower extremity wrapped with gauze and tape.  Good pulses in right dorsalis pedis.  Long overgrown toenails on the right foot. Neuro: Alert and oriented, Speech clear, No gross deficits  Assessment and plan:  Wound of right leg Continued.  Patient is getting home health wound care.  They recently asked for [facility] prescription which I sent.  Patient states her wound is improving.  She is unwilling for me to unwrap it today.  I still question her vascular flow, but she refuses ABIs.  Good pulse in the right foot.  Overgrown toenails Patient's right toenails are quite overgrown and rubbing each other.  Offered podiatry referral for trimming.  She states she does not want to see any additional doctors and will let me know if this changes.  Possible wheezing: Patient requests albuterol for what she perceives as wheezing occasionally at home.  No wheezing on exam.  Counseled her that she should  stop smoking and we will not prescribe albuterol today.  Loni Muse, MD, PGY3 10/28/2018 2:18 PM

## 2018-10-28 NOTE — Assessment & Plan Note (Signed)
Continued.  Patient is getting home health wound care.  They recently asked for [facility] prescription which I sent.  Patient states her wound is improving.  She is unwilling for me to unwrap it today.  I still question her vascular flow, but she refuses ABIs.  Good pulse in the right foot.

## 2018-11-12 ENCOUNTER — Telehealth: Payer: Self-pay | Admitting: Family Medicine

## 2018-11-12 DIAGNOSIS — L608 Other nail disorders: Secondary | ICD-10-CM

## 2018-11-12 NOTE — Telephone Encounter (Signed)
Pt would like Dr. Chanetta Marshall call her back concerning her toenail. She thinks its going to fall off. Please call pt back at [phone].

## 2018-11-12 NOTE — Telephone Encounter (Signed)
Called patient back - she states she is worried her toenail will fall off. She says it is sore. She does recall that we discussed podiatry referral at last visit. She is now willing to go to podiatry. I will place referral. I explained where [facility] is and she took down their number.

## 2019-01-12 ENCOUNTER — Telehealth: Payer: Self-pay

## 2019-01-12 NOTE — Telephone Encounter (Signed)
[name], would nurse, called nurse line requesting VO for Aquacel to be applied to patients right LE would. Verbal can be called to [name] [phone].

## 2019-01-14 NOTE — Telephone Encounter (Signed)
LVM for kimberly to call office back to give her the VO for her request per Dr. Chanetta Marshall. Grey Schlauch Zimmerman Rumple, CMA

## 2019-01-14 NOTE — Telephone Encounter (Signed)
Ok to give verbal order.

## 2019-01-15 NOTE — Telephone Encounter (Signed)
FYI..Spoke to Elliott. Gave VO but now pt is refusing to use Aquacel. Pt said she has used it in the past and it made her leg burn too much. Sunday Spillers, CMA

## 2019-01-23 ENCOUNTER — Telehealth: Payer: Self-pay | Admitting: *Deleted

## 2019-01-23 NOTE — Telephone Encounter (Signed)
Needs seen for this please. In person in case there is an infection.

## 2019-01-23 NOTE — Telephone Encounter (Signed)
Called pt and relayed message.    She states 'there is no infection, all I need is NEVER MIND GOODBYE" and hung up the phone.''  LMOVM of kimberly to call back so that I could update her. Jone Baseman, CMA

## 2019-01-23 NOTE — Telephone Encounter (Signed)
Cala Bradford calls because pt has edema in both legs x 2 weeks, she also has Stage 2 ulcers front and back a lower right leg.  She is just now expressing pain per Cala Bradford.  Pt is requesting a script for Ibuprofen 800mg  and Lasix.  She uses [facility] [address].  Will forward to MD.    Please call kimberly back and let her know the plan. Jone Baseman, CMA

## 2019-01-23 NOTE — Telephone Encounter (Signed)
Kmiberly informed.  She knew pt would say that.  She states that usually pt will not answer calls and she has to go to her house to provide care.   She will go back out next week.  Jone Baseman, CMA

## 2019-01-25 NOTE — Telephone Encounter (Signed)
Thanks team, would love to see her to check on her. If she calls back, maybe we can offer her Unna boots or wound management as her reason for visit instead of antibiotics. I appreciate the help!

## 2019-02-06 ENCOUNTER — Telehealth: Payer: Self-pay | Admitting: Family Medicine

## 2019-02-06 NOTE — Telephone Encounter (Signed)
Elmyra Ricks from [facility] is calling concerning patients wound care.   Elmyra Ricks went to visit patient today and Elmyra Ricks stated that she was extremely rude. She cussed her out and would not let her help her. Elmyra Ricks ended up leaving.   She wanted Dr. Lindell Noe know above as well as let her know that patient is treating the wounds herself only using regular water and the incorrect wrapping.   Elmyra Ricks would like to have verbal orders to start Iodaflex treatment. She would also like Dr. Lindell Noe to call patient if possible to explain that if she keeps refusing care that she will be discharged as their patient.   The best call back number for Elmyra Ricks is [phone].

## 2019-02-06 NOTE — Telephone Encounter (Signed)
Will forward to MD to advise. Raef Sprigg,CMA  

## 2019-02-09 NOTE — Telephone Encounter (Signed)
Called patient to check on her, she says she is doing well. [name] changed all the dressings this morning. They seem to agree on the plan for now. She says she feels like she is retaining fluid right now and this is making it hurt some. She doesn't want to come to the office for a check. She doesn't think it is infected right now. Patient reports they are talking about different dressings and possibly what sounds like a ACE wrap. I recommended elevation of the leg, she says "I have never been able to elevate it and I won't now." I explained what elevation means, she still doesn't want to try to place pillows under it.   I also called [name] who reports that patient asked her to leave with expletives last week. This morning, they got along better and the patient agreed to take a team approach in continuing to work on her wound. I explained that this patient is presumed to be bipolar based on her behavior here and previous evaluations that the patient has reported to [place].  I explained that in my experience this patient has days in which she seems very cooperative and is agreeable to the plan and other days where she does get frustrated and leave before we finished discussions.  I suggested that we try to be as patient with her as possible knowing that she has good days and bad days.

## 2019-02-12 ENCOUNTER — Other Ambulatory Visit: Payer: Self-pay | Admitting: Family Medicine

## 2019-02-12 NOTE — Telephone Encounter (Signed)
Please call patient - she does not take regular lasix (has had it once in the past from the ED), if she thinks things are really bad we need to see her. She needs to try to elevate her feet to help with the swelling as well.

## 2019-02-12 NOTE — Telephone Encounter (Signed)
Pt is calling and would like a refill on her lasix. Her legs are very swollen. Please call to discuss. jw

## 2019-02-16 NOTE — Telephone Encounter (Signed)
Called pt. Has an appt for Monday. Pt would like to come in sooner. Going to try to get a ride. I told pt I would call her tomorrow to see if she got a ride. Ottis Stain, CMA

## 2019-02-23 ENCOUNTER — Ambulatory Visit: Payer: Medicare Other | Admitting: Family Medicine

## 2019-02-24 ENCOUNTER — Ambulatory Visit: Payer: Self-pay | Admitting: Podiatry

## 2019-03-10 ENCOUNTER — Telehealth: Payer: Self-pay | Admitting: Family Medicine

## 2019-03-10 NOTE — Telephone Encounter (Signed)
Attempted to call patient to discuss this.  No answer, left VM for her to call back.  Per notes from Dr. Lindell Noe, it seems as though patient has suspected bipolar disorder and occasionally refuses certain treatments.  Given that her wound is getting worse, I would like to see her in the office to sort this out.  If she calls back please let her know that I would love to have an in-person visit with her to meet her and assess her wound and what we can do for wound care.    If she's available, I even have a [facility] clinic on 7/27 that I think would be great for her (okay per me to book her for this if she speaks with one of you first).

## 2019-03-10 NOTE — Telephone Encounter (Signed)
[name] from [facility] is calling to inform Dr. Sandi Carne that they will be discharging her as a patient.   She refuses for the nurses to assist her with her wound care. [name] states they have given her many chances but her wound is getting worse and they are not able to help her.   The best call back number for [name] if there are any questions is [phone].

## 2019-03-11 NOTE — Telephone Encounter (Signed)
LVM on home phone and tried mobile and it only rang and then went to busy signal.  Asked pt to call office to schedule her an appointment per Dr. Sandi Carne. Please read below message about possible [facility] clinic. Nery Kalisz Zimmerman Rumple, CMA

## 2019-03-12 NOTE — Telephone Encounter (Signed)
Contacted pt and she stated that her wound is not good, she does not have a nurse coming out and she does not have supplies.  She said she is waiting for her supplies to come in.  I told her that PCP would like for her to come in for a visit and she said why do I need to come in.  I told her that the doctor would like to evaluate her wound and she stated that the doctor doesn't want to see my wound she wants to do other things.  She stated that she is still quarantined and that she is not going out in public.  She said just have her call me and the call was ended. Routing to PCP.  Alvin Rubano Zimmerman Rumple, CMA

## 2019-03-13 ENCOUNTER — Telehealth: Payer: Self-pay | Admitting: Family Medicine

## 2019-03-13 ENCOUNTER — Telehealth: Payer: Medicare Other | Admitting: Family Medicine

## 2019-03-13 ENCOUNTER — Other Ambulatory Visit: Payer: Self-pay

## 2019-03-13 NOTE — Telephone Encounter (Signed)
Attempted to call patient x3 at both home and mobile number listed.  No answer, unable to leave voicemail.

## 2019-03-13 NOTE — Telephone Encounter (Signed)
Patient scheduled for telemedicine appointment today.

## 2019-03-17 ENCOUNTER — Telehealth: Payer: Self-pay | Admitting: Family Medicine

## 2019-03-17 NOTE — Telephone Encounter (Signed)
Called patient regarding her right leg wound that has been reported to be worsening in the setting of being dismissed from [facility] for not allowing them to care for her wound, cursing at them, and "becoming verbally abusive towards staff."  Patient has also previously spoken to [facility] at my request and offered a clinic appointment, but she refused.  She again refused a clinic appointment and stated that she wanted [facility] to come to her house again.  She was told that they would not be able to come to her house, because they have dismissed her since she would not allow them to care for her wound.  She stated, "I've been caring for my wounds for years, I don't need anyone to do it for me!"  She was advised that given her wound has been worsening, she should have a professional care for this.  She became angry and yelled to, "Cut the *expletive* off!"  Explained that the goal to was treat her wound and to allow her to keep all of her extremities.  She again yelled, "I'm not talking to you people anymore about this!"  She then hung up the phone.  Arizona Constable, D.O.  PGY-2 Family Medicine  03/17/2019 9:28 AM   Will forward this to Casimer Lanius, LCSW to see if she can assist with this complex case and assess if patient qualifies for [facility] case manager.

## 2019-03-18 ENCOUNTER — Telehealth: Payer: Self-pay | Admitting: Licensed Clinical Social Worker

## 2019-03-18 NOTE — Telephone Encounter (Signed)
Thank you for your help.

## 2019-03-18 NOTE — Telephone Encounter (Signed)
    Outreach Note  03/18/2019 Name: [name] MRN: [id number] DOB: 07/17/1951  Referred by: Cleophas Dunker, DO Reason for referral : Care Coordination to assess needs and barriers.   An unsuccessful telephone outreach was attempted today. The patient was referred to the case management team by for assistance with chronic care management and care coordination.   LCSW called patient's significant other listed on her contact, spoke with sam who states patient does not have a phone. She was unable to purchase minutes for her phone this month.  He give message to patient to call LCSW.   Follow Up Plan:  A HIPPA compliant phone message was left for the patient providing contact information and requesting a return call.  LCSW will reach out to the patient again over the next 2 to 5 days.  If no return call is received.    Dr. Sandi Carne has been notified of this outreach and F/U plan.  Casimer Lanius, [place]   [phone] 1:55 PM

## 2019-03-24 ENCOUNTER — Telehealth: Payer: Self-pay | Admitting: Licensed Clinical Social Worker

## 2019-03-24 ENCOUNTER — Telehealth: Payer: Self-pay | Admitting: *Deleted

## 2019-03-24 NOTE — Telephone Encounter (Signed)
  Care Coordination  Telephone Outreach Note  03/24/2019 Name: [name]      MRN: [id number]          DOB: June 11, 1951 Total time: 15 minutes Referred by: PCP, Dr.Meccariello Reason for referral : Care Coordination  I reached out to Ms. [name] today by phone in response to a referral sent by Ms. [name] PCP to assess needs and barriers for care coordination. Per PCP patient has been dismissed from [name] for being verbally abusive towards staff.  Patient's tone is harsh and she states she needs help.  Patient would like to start home health services again and is requesting [name].  Reports her leg is "running water" but she keeps it up.  Reports needing more supplies and a nurse to come look at her leg.  Patient is also open to doing a phone visit with PCP but not willing to come into the office.  INTERVENTION: Patient interviewed and appropriate assessments performed. Review of patient consultants notes from appropriate care team members was performed as part of provision for care coordination referral. ; also utilized engagement as part of my intervention.  Other interventions include: Consult MD .  PLAN::  1. LCSW will provide PCP an update on patient's needs 2. Will share note with [name] 3. Will F/U with patient in 1 week  [name], Caroline Raisin, DO has been notified of this outreach and Ms. [name] decision and plan.   Casimer Lanius, [facility]   [phone] 9:53 AM

## 2019-03-24 NOTE — Telephone Encounter (Signed)
-----   Message from Cleophas Dunker, DO sent at 03/24/2019 10:25 AM EDT ----- Regarding: RE: concerns with patient's leg Thank you for your help Neoma Laming.  [facility] team, can we schedule her for a televisit appointment with me?  I still think she will need a face-to-face based on what I've spoken to Dr. Erin Hearing about, but this will likely help.  ----- Message ----- From: Maurine Cane, LCSW Sent: 03/24/2019   9:55 AM EDT To: Bernita Raisin Meccariello, DO, [facility] Subject: concerns with patient's leg                    Please see my note.  Sounds like her leg is not doing good.  She is open to a new home health agency ( [facility]) and open to a phone appointment with PCP.That's as far as I could get today.    Patient has phone problems as she is unable to dial out, so if you are unable to reach her you will need to call back. Nurse clinic or [facility] can set up new home health but not sure it that requires a new face to face. You will need to check with PCP to see next steps.  Patient prefers AM appointment  Please let me know if there is anything I need to do.  I plan to F/U with patient in 1 week.  Casimer Lanius, [facility]   [phone] 10:06 AM

## 2019-03-26 NOTE — Telephone Encounter (Signed)
Contacted pt at both numbers, LVM on home and mobile rang and then went to busy signal.  If pt calls back please inform her of below and assist her in getting this appointment scheduled. April Zimmerman Rumple, CMA

## 2019-03-27 ENCOUNTER — Telehealth (INDEPENDENT_AMBULATORY_CARE_PROVIDER_SITE_OTHER): Payer: Medicare Other | Admitting: Family Medicine

## 2019-03-27 ENCOUNTER — Other Ambulatory Visit: Payer: Self-pay

## 2019-03-27 DIAGNOSIS — S81801D Unspecified open wound, right lower leg, subsequent encounter: Secondary | ICD-10-CM

## 2019-03-27 DIAGNOSIS — F3110 Bipolar disorder, current episode manic without psychotic features, unspecified: Secondary | ICD-10-CM

## 2019-03-27 NOTE — Assessment & Plan Note (Signed)
Likely complicating patient's wound care, although today is very amiable and receptive to care, despite refusing to been seen in person due to COVID-19.  Casimer Lanius, LCSW also involved and planning to follow up with patient next week.  Hopeful that with continued relationship with the patient, she will consider psych referral, as she would greatly benefit from this.

## 2019-03-27 NOTE — Progress Notes (Signed)
[place] Telemedicine Visit  Patient consented to have virtual visit. Method of visit: Telephone  Encounter participants: Patient: [name] - located at home Provider: Cleophas Dunker - located at [facility] Others (if applicable): None  Chief Complaint: RLE wounds  HPI: Patient has a history of RLE wounds, last seen in clinic in March, for which she has been receiving [facility] wound care.  She was dismissed from her [facility] practice because she was "verbally abusive" to staff and refusing for them to treat her.  The patient has been very concerned about COVID-19 and therefore has opted to have a visit over the phone to discuss this, as she would not like to accept the risk of coming to the clinic for an examination.  She notes the over the last month, she has noticed her right lower extremity wounds have been worsening.  She states that she has one wound above her right ankle and one on the back of her right calf.  She states that the one around her ankle is surrounded by red kin and is very painful.  She notes that both wounds are draining fluid.  She states that her left leg also had a wound "that busted open and healed on its own."    She notes that she changed her dressing this AM and applied MediHoney, but that she is running out of supplies.  She is requesting [facility] agency for her wound care.  She denies current fevers and states that overall she is feeling well, but that her wounds look worse.   ROS: per HPI  Pertinent PMHx: Atrial Fibrillation, Bipolar Disorder  Exam:  Respiratory: Speaking in complete sentences without evidence of respiratory distress over the phone.   Psych: mood and affect appropriate, cooperative with examination and questioning  Assessment/Plan:  Wound of right leg Concern that wound is worsening.  Unable to assess visually, but patient notes that she has two wounds on ankle and back of calf that are  draining.  Will place home health referral, as patient is unable to travel safely to obtain care.  She was given appropriate return precautions including fever and worsening redness around the wounds, which she voiced understanding of.   - Home health referral - supplies per home health  Bipolar I disorder with mania ([place]) Likely complicating patient's wound care, although today is very amiable and receptive to care, despite refusing to been seen in person due to COVID-19.  Casimer Lanius, LCSW also involved and planning to follow up with patient next week.  Hopeful that with continued relationship with the patient, she will consider psych referral, as she would greatly benefit from this.    Time spent during visit with patient: 11 minutes

## 2019-03-27 NOTE — Telephone Encounter (Signed)
Virtual appt made for this afternoon @ 2:20.  Pt refused to come in. Christen Bame, CMA

## 2019-03-27 NOTE — Assessment & Plan Note (Signed)
Concern that wound is worsening.  Unable to assess visually, but patient notes that she has two wounds on ankle and back of calf that are draining.  Will place home health referral, as patient is unable to travel safely to obtain care.  She was given appropriate return precautions including fever and worsening redness around the wounds, which she voiced understanding of.   - Home health referral - supplies per home health

## 2019-03-30 ENCOUNTER — Telehealth (INDEPENDENT_AMBULATORY_CARE_PROVIDER_SITE_OTHER): Payer: Medicare Other | Admitting: Family Medicine

## 2019-03-30 ENCOUNTER — Other Ambulatory Visit: Payer: Self-pay

## 2019-03-30 DIAGNOSIS — S81802A Unspecified open wound, left lower leg, initial encounter: Secondary | ICD-10-CM

## 2019-03-30 DIAGNOSIS — S81801D Unspecified open wound, right lower leg, subsequent encounter: Secondary | ICD-10-CM

## 2019-03-30 MED ORDER — DOXYCYCLINE HYCLATE 100 MG PO TABS: 100.0000 mg | ORAL_TABLET | Freq: Two times a day (BID) | ORAL | 0 refills | Status: DC

## 2019-03-30 NOTE — Progress Notes (Signed)
[place] Telemedicine Visit  Patient consented to have virtual visit. Method of visit: Telephone  Encounter participants: Patient: [name] - located at home Provider: [place] Rohith Fauth - located at [facility]  Others (if applicable): n/a  Chief Complaint: Bilateral leg wounds  HPI: Left leg wounds Patient reports that on her left leg she has multiple wounds which are exuding froth.  She reports using Mehdi honey on it which she reports is making her leg much worse.  She states that her leg is throbbing and is hot.  She states that she has not had any fevers or chills.  She does not want to be seen by an office because she is worried that she may get a "COVID infection in her wound".  States that she knows that this is not a normal side effect of COVID but she does not want to be the first person to have this happen to her.  She reports that she has not heard any information from home health wound care that was ordered for her on Friday.  She reports that she is having dripping from the blisters on her leg of a creamy material.  Right leg wounds Patient states that her right leg has been exuding a smell similar to "tapioca pudding" states that it has been smelling foul for a couple of months now.  She states that she has not been seen by a doctor since March for these leg wounds.  She was previously wrapping these by herself but she has run out of wrap at home.  Patient would like for someone to be able to come to her house in order to check on her as she does not feel comfortable going out and COVID.  She denies fever, chills, nausea, vomiting, fatigue  ROS: per HPI  Pertinent PMHx: Atrial fibrillation  Exam:  Respiratory: able to speak in complete sentences without issue  Assessment/Plan:  Bilateral leg wounds After extensive conversation with patient was able to convince her to come to an appointment in our clinic.  Also prescribed her doxycycline prior to  her agreeing to be seen by a physician.  Patient is quite anxious but states that she realizes that she needs to have her wounds looked at given that she is having heat in her leg as well as some reported redness, drainage and foul smell.  Time spent during visit with patient: 15 minutes  [place] Jackye Dever, DO PGY-3, [place]

## 2019-03-31 ENCOUNTER — Encounter: Payer: Self-pay | Admitting: Family Medicine

## 2019-03-31 ENCOUNTER — Other Ambulatory Visit: Payer: Self-pay

## 2019-03-31 ENCOUNTER — Ambulatory Visit (INDEPENDENT_AMBULATORY_CARE_PROVIDER_SITE_OTHER): Payer: Medicare Other | Admitting: Family Medicine

## 2019-03-31 VITALS — BP 118/64 | HR 92

## 2019-03-31 DIAGNOSIS — I83009 Varicose veins of unspecified lower extremity with ulcer of unspecified site: Secondary | ICD-10-CM | POA: Insufficient documentation

## 2019-03-31 DIAGNOSIS — L97801 Non-pressure chronic ulcer of other part of unspecified lower leg limited to breakdown of skin: Secondary | ICD-10-CM | POA: Diagnosis not present

## 2019-03-31 DIAGNOSIS — L97909 Non-pressure chronic ulcer of unspecified part of unspecified lower leg with unspecified severity: Secondary | ICD-10-CM

## 2019-03-31 DIAGNOSIS — I872 Venous insufficiency (chronic) (peripheral): Secondary | ICD-10-CM

## 2019-03-31 MED ORDER — CEPHALEXIN 500 MG PO CAPS: 500.0000 mg | ORAL_CAPSULE | Freq: Four times a day (QID) | ORAL | 0 refills | Status: AC

## 2019-03-31 NOTE — Assessment & Plan Note (Signed)
Patient with significant venous stasis wounds bilaterally, right worse than left.  Area is weeping.  Does have some odor.  Some erythema and tenderness as well.  Some warmth to the area.  Given chronicity of venous stasis will apply Unna boots here as well as refer to home health RN for continued wound care at home.  Advised to follow-up in 1 week for Unna boot removal as well as follow-up of leg wounds.  Hopefully with Keflex and Unna boots this will improve.  Patient will likely need long-term wound care follow-up.  This may take some time to get established with.  We will start with home health wound care and follow-up here at [facility].  If no improvement can consider referral to wound care specialist.  Strict return precautions given.  Follow-up in 1 week.

## 2019-03-31 NOTE — Progress Notes (Signed)
   Subjective:    Patient ID: [name], female    DOB: [birth year], 68 y.o.   MRN: [id number]   CC: leg wounds  HPI: Bilateral leg wounds Patient presenting with bilateral leg wounds.  States that they have been present for 3 years.  States that the right is significantly worse than the left.  Has tried antibiotics in the past such as Keflex which worked some but this is been a while since she took them.  States that she was getting home health at some point but they have stopped coming.  Prior to that she was homeless and was not getting any care.  Has been using wrappings at home but has been out of supplies for 2 weeks now.  Did see wound care at some point but has not seen them in over a year.  Denies any fevers.  Does report that they are draining "water".  Does have pain.  Objective:  BP 118/64   Pulse 92   SpO2 99%  Vitals and nursing note reviewed  General: well nourished, in no acute distress HEENT: normocephalic Extremities: tender to palpation. Warm, well perfused. Difficult to palpate pulses 2/2 edema. Some thready pulses felt on DP and PT.  Skin:      Neuro: alert and oriented, no focal deficits   Assessment & Plan:    Venous stasis ulcer ([name]) Patient with significant venous stasis wounds bilaterally, right worse than left.  Area is weeping.  Does have some odor.  Some erythema and tenderness as well.  Some warmth to the area.  Given chronicity of venous stasis will apply Unna boots here as well as refer to home health RN for continued wound care at home.  Advised to follow-up in 1 week for Unna boot removal as well as follow-up of leg wounds.  Hopefully with Keflex and Unna boots this will improve.  Patient will likely need long-term wound care follow-up.  This may take some time to get established with.  We will start with home health wound care and follow-up here at [facility].  If no improvement can consider referral to wound care specialist.  Strict return precautions  given.  Follow-up in 1 week.    Return in about 1 week (around 04/07/2019).  Discussed patient with Dr. Wendy Poet   Caroline More, DO, PGY-3

## 2019-03-31 NOTE — Patient Instructions (Addendum)
[facility] An Foot LockerUnna boot is a type of bandage (dressing) for the foot and leg. The dressing is a gauze wrap that is soaked with a type of medicine called zinc oxide. The gauze may also include other lotions and medicines that help in wound healing, such as calamine. An Unna boot may be used to treat:  Open sores (ulcers) on the foot, heel, or leg.  Swelling from disorders that affect the veins or lymphatic system (lymphedema).  Skin conditions such as chronic inflammation caused by poor blood flow (stasis dermatitis). The dressing is applied by a health care provider. The gauze is wrapped around your lower extremity in several layers, usually starting at the toes and going upward to the knee. A dry outer wrap goes over the medicated wrap for support and compression.  Before applying the Foot LockerUnna boot, your health care provider will clean your leg and foot and may apply an antibiotic ointment. You may be asked to raise (elevate) your leg for a while to reduce swelling before the boot is applied. The boot will dry and harden after it is applied. The boot may need to be changed or replaced about twice a week. Follow these instructions at home: Boot care  Wear the Foot LockerUnna boot as told by your health care provider.  You may need to wear a slipper or shoe over the boot that is one or two sizes larger than normal.  Check the skin around the boot every day. Tell your health care provider about any concerns.  Do not stick anything inside the boot to scratch your skin. Doing that increases your risk of infection.  Keep your Foot LockerUnna boot clean and dry.  Check every day for signs of infection. Check for: ? Redness, swelling, or pain in your foot or toes. ? Fluid or blood coming from the boot. ? Pus or a bad smell coming from the boot.  Remove the boot and call your health care provider if you have signs of poor blood flow, such as: ? Your toes tingle or become numb. ? Your toes turn cold or turn blue or  pale. ? Your toes are more swollen or painful. ? You are unable to move your toes. Activity  You may walk with the boot once it has dried. Ask your health care provider how much walking is safe for you.  Avoid sitting for a long time without moving. Get up to take short walks as told by your health care provider. This is important to improve blood flow. Bathing  Do not take baths, swim, or use a hot tub until your health care provider approves. Ask your health care provider if you may take showers.  If your health care provider approves a bath or a shower, do not let the Unna boot get wet. ? If you take a shower, cover the boot with a watertight covering. ? If you take a bath, keep your leg with the boot out of the tub. General instructions  Keep your leg elevated above the level of your heart while you are sitting or lying down. This will decrease swelling.  Do not sit with your knee bent for long periods of time.  Take over-the-counter and prescription medicines only as told by your health care provider.  Do not use any products that contain nicotine or tobacco, such as cigarettes, e-cigarettes, and chewing tobacco. These can delay healing. If you need help quitting, ask your health care provider.  Keep all follow-up visits as  told by your health care provider. This is important. Contact a health care provider if:  Your skin feels itchy inside the boot.  You have a burning sensation, a rash, or itchy, red, swollen areas of skin (hives) in the boot area.  You have a fever or chills.  You have any signs of infection, such as: ? New redness, swelling, or pain. ? More fluid or blood coming from the boot. ? Pus or a bad smell coming from the boot.  You have increased numbness or pain in your foot or toes.  You have any changes in skin color on your foot or toes, such as the skin turning blue or pale or developing patchy areas with spots.  Your boot has been damaged or feels  like it is no longer fitting properly. Summary  An Roland RackUnna boot is a type of bandage (dressing) system for the foot and leg.  The dressing is a gauze wrap that is soaked with a type of medicine (zinc oxide) to treat foot, heel, or leg ulcers, swelling from disorders that affect the veins or lymphatic system (lymphedema), and skin conditions caused by poor blood flow (stasis dermatitis).  This dressing is applied by a health care provider. After it is applied, the boot will dry and harden.  The boot may need to be changed or replaced about twice a week.  Let your health care provider know if you have any signs of poor blood flow or infection. This information is not intended to replace advice given to you by your health care provider. Make sure you discuss any questions you have with your health care provider. Document Released: 04/23/2018 Document Revised: 12/02/2018 Document Reviewed: 04/23/2018 Elsevier Patient Education  2020 Elsevier Inc.   Venous Ulcer A venous ulcer is a shallow sore on your lower leg. Venous ulcer is the most common type of lower leg ulcer. You may have venous ulcers on one leg or on both legs. This condition most often develops around your ankles. This type of ulcer may last for a long time (chronic ulcer) or it may return often (recurrent ulcer). What are the causes? This condition is caused by poor blood flow in your legs. The poor flow causes blood to pool in your legs. This can break the skin, causing an ulcer. What increases the risk? You are more likely to develop this condition if:  You are 68 years of age or older.  You are female.  You are overweight.  You are not active.  You have had a leg ulcer in the past.  You have varicose veins.  You have clots in your lower leg veins (deep vein thrombosis).  You have inflammation of your leg veins (phlebitis).  You have recently been pregnant.  You smoke. What are the signs or symptoms? The main  symptom of this condition is an open sore near your ankle. Other symptoms may include:  Swelling.  Thick skin.  Fluid coming from the ulcer.  Bleeding.  Itching.  Pain and swelling. This gets worse when you stand up and feels better when you raise your leg.  Blotchy skin.  Dark skin. How is this treated? This condition may be treated by:  Keeping your leg raised (elevated).  Wearing a type of bandage or stocking to keep pressure (compression) on the veins of your leg.  Taking medicines, including antibiotic medicines.  Cleaning your ulcer and removing any dead tissue from the wound.  Using bandages and wraps that have medicines  in them to cover your ulcer.  Closing the wound using a piece of skin taken from another area of your body (graft). Follow these instructions at home: Medicines  Take or apply over-the-counter and prescription medicines only as told by your doctor.  If you were prescribed an antibiotic medicine, take it as told by your doctor. Do not stop using the antibiotic even if you start to feel better.  Ask your doctor if you should take aspirin before long trips. Wound care  Follow instructions from your doctor about how to take care of your wound. Make sure you: ? Wash your hands with soap and water before and after you change your bandage (dressing). If you cannot use soap and water, use hand sanitizer. ? Change your bandage as told by your doctor. ? If you had a skin graft, leave stitches (sutures) in place. These may need to stay in place for 2 weeks or longer. ? Ask when you should remove your bandage. If your bandage is dry and sticks to your leg when you try to remove it, moisten or wet the bandage with saline solution or water to make it easier to remove.  Once your bandage is off, check your wound each day for signs of infection. Have a caregiver do this for you if you are not able to do it yourself. Check for: ? More redness, swelling, or  pain. ? More fluid or blood. ? Warmth. ? Pus or a bad smell. Activity  Do not sit for a long time without moving. Get up to take short walks every 1-2 hours. This is important. Ask for help if you feel weak or unsteady.  Ask your doctor what level of activity is safe for you.  Rest with your legs raised during the day. If you can, keep your legs above the level of your heart for 30 minutes, 3-4 times a day, or as told by your doctor.  Do not sit with your legs crossed. General instructions   Wear elastic stockings, compression stockings, or support hose as told by your doctor.  Raise the foot of your bed as told by your doctor.  Do not use any products that contain nicotine or tobacco, such as cigarettes, e-cigarettes, and chewing tobacco. If you need help quitting, ask your doctor.  Keep all follow-up visits as told by your doctor. This is important. Contact a doctor if:  Your ulcer is getting larger or is not healing.  Your pain gets worse. Get help right away if:  You have more redness, swelling, or pain around your ulcer.  You have more fluid or blood coming from your ulcer.  Your ulcer feels warm to the touch.  You have pus or a bad smell coming from your ulcer.  You have a fever. Summary  A venous ulcer is a shallow sore on your lower leg.  Follow instructions from your doctor about how to take care of your wound.  Check your wound each day for signs of infection.  Take over-the-counter and prescription medicines only as told by your doctor.  Keep all follow-up visits as told by your doctor. This is important. This information is not intended to replace advice given to you by your health care provider. Make sure you discuss any questions you have with your health care provider. Document Released: 09/20/2004 Document Revised: 04/10/2018 Document Reviewed: 04/10/2018 Elsevier Patient Education  2020 [employer].

## 2019-04-03 ENCOUNTER — Encounter: Payer: Self-pay | Admitting: Licensed Clinical Social Worker

## 2019-04-03 ENCOUNTER — Telehealth: Payer: Self-pay | Admitting: Licensed Clinical Social Worker

## 2019-04-03 ENCOUNTER — Telehealth: Payer: Self-pay | Admitting: *Deleted

## 2019-04-03 NOTE — Telephone Encounter (Addendum)
  Care Coordination Social Work Follow Up Note   04/03/2019 Name: SIMREN POPSON MRN: [id number] DOB: November 11, 1950  LSCW received return call from patient's case manager Ms. Grandville Silos.  She confirmed that patient receives Meals on Wheels and the meals are dropped off every Monday.  She has also offered patient foodbox several times this month and patient has declined this resource.  Plan: 1. Ms Grandville Silos will check in with patient to make sure she has food. 2. LCSW does not need to provide patient with foodbox as Ms. Grandville Silos has the same box to provide patient. 3. LCSW will F/U with patient in two weeks to assess for ongoing needs and barriers.   Casimer Lanius, LCSW Clinical Social Worker [place] / [place]   [phone] 3:45 PM

## 2019-04-03 NOTE — Telephone Encounter (Signed)
  Care Coordination Social Work Follow Up Note   04/03/2019 Name: [name] MRN: [id number] DOB: July 02, 1951  Referred by: Cleophas Dunker, DO , Reason for referral : Care Coordination (F/U) , [name] is a 68 y.o. year old female who is a primary care patient of [place], Bernita Raisin, DO.   Reason for follow-up: phone encounter with patient today for ongoing assessment and brief interventions to assist with care coordination needs.     Review of patient status, including review of consultants reports, relevant laboratory and other test results, and collaboration with appropriate care team members and the patient's provider was performed as part of comprehensive patient evaluation and provision of chronic care management services.   LIFE CONTEXT: Family and Social: lives alone in senior apartments, has no family, one friend in the building and female friend that helps with taking her to the store.   Community support: case manager Ms Grandville Silos [phone] in building;  [place] [employer] for medical appointments; Meals on wheels;  Life Changes: female friend has not been able to take to the store. Strengths: Active sense of humor;Communication skills Support System:Self Advocate and limited support system. Assessment: Patient is in a much better mood today. She is engaged in conversation however conversation was short due to patient in pain with leg. She is now open to receiving [facility] services.  Wound care comes to assist patient on Tuesday and Thursday of each week. She sould like help with getting more protein. She reports eating mostly TV dinners and has meals on wheels however they have not delivered to her lately.   Recommendation: Patient contact meals on wheels to find out why they are no longer delivering meals.  Reach out to Ms. Thompson case Freight forwarder in the building for support. Intervention:  Provided client with information about other community food options ; also utilized  engagement as part of my intervention.  Other interventions include:  Solution-Focused Strategies. Patient provided LCSW with name, phone number and permission to contact her case manager in the building to discuss coordinating resources for food.  SDOH (Social Determinants of Health) screening performed related to challenges with: Food Insecurity. Left message for case worker to contact patient about food insecurities and F/U with LCSW for coordination of food needs. Goal: Connect patient with food resources Plan: 1. LCSW will reach out to the patient again over the next 2 to 3 business days days.  2. The patient will call her case worker and female friend as advised to  see if they are able to assist with food resources for this weekend. 3. LCSW will offer patient food from the foodbox program to take home after appointment at [facility] next week.   Casimer Lanius, LCSW Clinical Social Worker [place] / [place]   [phone] 2:06 PM

## 2019-04-03 NOTE — Telephone Encounter (Signed)
Natasha from [facility] calling for RN verbal orders as follows:  2 time(s) weekly for 9 week(s) with 3 PRN visits.  They will also be treating the leg wounds with soap and water and replace unna boots.  You can leave verbal orders on confidential voicemail.  Christen Bame, CMA

## 2019-04-04 NOTE — Telephone Encounter (Signed)
Called and left VM at number listed with orders for patient.

## 2019-04-06 NOTE — Progress Notes (Deleted)
   Subjective:    Patient ID: [name], female    DOB: [birth year], 68 y.o.   MRN: [id number]   CC:  HPI: F/u wounds Patient seen by me on 8/4 and was diagnosed with venous stasis ulcer.  Was given bilateral Unna boots.  Returning today for follow-up.  Per chart review PCP has given verbal orders for 2 times weekly for 9 weeks with 3 PRN RN visits for wound care and to replace Unna boots.  Smoking status reviewed  Review of Systems   Objective:  There were no vitals taken for this visit. Vitals and nursing note reviewed  General: well nourished, in no acute distress HEENT: normocephalic, TM's visualized bilaterally, no scleral icterus or conjunctival pallor, no nasal discharge, moist mucous membranes, good dentition without erythema or discharge noted in posterior oropharynx Neck: supple, non-tender, without lymphadenopathy Cardiac: RRR, clear S1 and S2, no murmurs, rubs, or gallops Respiratory: clear to auscultation bilaterally, no increased work of breathing Abdomen: soft, nontender, nondistended, no masses or organomegaly. Bowel sounds present Extremities: no edema or cyanosis. Warm, well perfused. 2+ radial and PT pulses bilaterally Skin: warm and dry, no rashes noted Neuro: alert and oriented, no focal deficits   Assessment & Plan:    No problem-specific Assessment & Plan notes found for this encounter.    No follow-ups on file.   Caroline More, DO, PGY-3

## 2019-04-07 ENCOUNTER — Ambulatory Visit: Payer: Medicare Other | Admitting: Family Medicine

## 2019-04-10 ENCOUNTER — Telehealth: Payer: Self-pay
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
## 2019-04-15 ENCOUNTER — Telehealth (INDEPENDENT_AMBULATORY_CARE_PROVIDER_SITE_OTHER): Payer: Medicare Other | Admitting: Family Medicine

## 2019-04-15 ENCOUNTER — Other Ambulatory Visit: Payer: Self-pay

## 2019-04-15 DIAGNOSIS — I872 Venous insufficiency (chronic) (peripheral): Secondary | ICD-10-CM | POA: Diagnosis not present

## 2019-04-15 DIAGNOSIS — I878 Other specified disorders of veins: Secondary | ICD-10-CM | POA: Diagnosis not present

## 2019-04-15 DIAGNOSIS — L97909 Non-pressure chronic ulcer of unspecified part of unspecified lower leg with unspecified severity: Secondary | ICD-10-CM
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
## 2019-04-20 ENCOUNTER — Other Ambulatory Visit: Payer: Self-pay

## 2019-04-20 ENCOUNTER — Telehealth (INDEPENDENT_AMBULATORY_CARE_PROVIDER_SITE_OTHER): Payer: Medicare Other | Admitting: Family Medicine

## 2019-04-20 DIAGNOSIS — Z7409 Other reduced mobility: Secondary | ICD-10-CM

## 2019-04-20 NOTE — Progress Notes (Signed)
[facility] Telemedicine Visit  Patient consented to have virtual visit. Method of visit: Telephone  Encounter participants: Patient: [name] - located at home Provider: Rory Percy - located at [facility] Others (if applicable): none  Chief Complaint: wants a wheelchair  HPI:  Patient calling because she wants to get in touch with her caseworker to send paperwork to PCP for applying for a permanent electric wheelchair.  She states she is able to walk without a walker or cane currently but does often take her time when changing positions or walking.  She uses an IT trainer wheelchair occasionally when [place] is in [place] and really likes these.  She expresses desire to never walk again.  She reports she sometimes falls when she feels either her estrogen or blood sugar levels are low.  She denies requiring assistance for walking.  [facility].  Previously recommended going to the wound care center but patient has adamantly refused in the past and continues to refuse to be seen in person due to [place].  ROS: per HPI  Pertinent PMHx: afib, venous stasis, anemia, bipolar d/o  Exam:  Respiratory: exam full sentences, no respiratory distress  Assessment/Plan:  Limited mobility Given she is able to mobilize independently without walker or cane argue against the medical necessity for electric wheelchair.  Explained this to patient however she became very angry and stated she will get a wheelchair despite who she has to take to court and when she gets one, she will never walk again.  Explained to patient she can purchase a wheelchair should she desire though it is not likely her insurance will pay for this but that I would recommend she come in for a visit to more fully work-up why she sometimes falls in addition to a physical exam but she refuses to do this.  Will forward to PCP per patient request.    Time spent  during visit with patient: 13 minutes

## 2019-04-20 NOTE — Assessment & Plan Note (Signed)
Given she is able to mobilize independently without walker or cane argue against the medical necessity for electric wheelchair.  Explained this to patient however she became very angry and stated she will get a wheelchair despite who she has to take to court and when she gets one, she will never walk again.  Explained to patient she can purchase a wheelchair should she desire though it is not likely her insurance will pay for this but that I would recommend she come in for a visit to more fully work-up why she sometimes falls in addition to a physical exam but she refuses to do this.  Will forward to PCP per patient request.

## 2019-04-22 ENCOUNTER — Telehealth: Payer: Self-pay

## 2019-04-22 ENCOUNTER — Telehealth: Payer: Self-pay | Admitting: Licensed Clinical Social Worker

## 2019-04-22 NOTE — Telephone Encounter (Signed)
Pt LVM on nurse line stating she is "not coming in Monday and to stop calling. Im not a baby you know." and hung the phone up. I tried to pt and let her know that the appt is for a telephone visit and ask her if she would like to talk to the Dr. Over the phone but the pt answered the phone and immediately hung up the phone. I am not sure if the pt wants to talk to the Dr. Or not.  I will try calling the pt again. Ottis Stain, CMA

## 2019-04-22 NOTE — Telephone Encounter (Signed)
  Care Coordination  Clinical Social Work Follow Up Note 04/22/2019 Name: YITTA GONGAWARE MRN: [id number] DOB: Nov 12, 1950  Referred by: Cleophas Dunker, DO , Reason for referral : Care Coordination  LEVETTE PAULICK is a 68 y.o. year old female who is a primary care patient of [place], Bernita Raisin, DO.   Reason for follow-up: phone encounter with patient today for ongoing assessment and brief interventions to assist with care coordination needs.   Assessment: Patient continues to experience difficulty with managing her health needs.  Her tone today is harsh and demanding. Patient is not open to talking about her mental health and denies that there is a need or concern.  She shared that she is willing to go to [place] for rehab and that her DSS worker is assisting with this process.  Patient is unable to remember the DSS Worker's  name.   Recommendation: Patient may benefit from, and is in agreement for LCSW to contact DSS for care coordination and to determine who the worker is that is assisting patient and to see if they need an FL2 for this placement. Interventions:Patient interviewed and appropriate assessments performed Collaborated with DSS (community agency) re: SNF placement . Voice message left for Venia Minks at [facility] as she is usually the worker that does facility placement. Plan:SW will follow up with patient by phone over the next few days once received a return call from [place] of patient status, including review of consultants reports, relevant laboratory and other test results, and collaboration with appropriate care team members and the patient's provider was performed as part of comprehensive patient evaluation and provision of chronic care management services.    Meccariello, Bernita Raisin, DO has been notified of patient's recommendations and plan.  Casimer Lanius, LCSW Clinical Social Worker [place] [facility] [place]   [phone] 9:27 AM
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
## 2019-04-27 ENCOUNTER — Telehealth (INDEPENDENT_AMBULATORY_CARE_PROVIDER_SITE_OTHER): Payer: Medicare Other | Admitting: Family Medicine

## 2019-04-27 ENCOUNTER — Other Ambulatory Visit: Payer: Self-pay

## 2019-04-27 DIAGNOSIS — L97801 Non-pressure chronic ulcer of other part of unspecified lower leg limited to breakdown of skin: Secondary | ICD-10-CM | POA: Diagnosis not present

## 2019-04-27 DIAGNOSIS — I872 Venous insufficiency (chronic) (peripheral): Secondary | ICD-10-CM

## 2019-04-27 NOTE — Progress Notes (Signed)
[facility] Telemedicine Visit  Patient consented to have virtual visit. Method of visit: Telephone  Encounter participants: Patient: [name] - located at home Provider: Cleophas Dunker - located at [facility] Others (if applicable): none  Chief Complaint: leg wounds  HPI: Called and spoke with patient regarding leg wounds for follow-up.  Patient reports "they are fine, and taking care of them fine."  She does report that she needs more supplies to take care of her legs.  She is requesting "wrap" for her legs.  She notes that she would be willing to come in for an appointment in 2 days as long as her fianc can bring her.  She reports that she cannot come today or tomorrow because "I get my check tomorrow, and I need to get food in his house."  When asked if she has enough food for today, she does not answer questions directly.  She continues to say "I am fine."  ROS: per HPI  Pertinent PMHx: Bilateral lower extremity venous stasis ulcers, likely uncontrolled bipolar disorder causing barriers to patient's care  Exam:  Respiratory: Speaking complete sentences, no evidence of respiratory distress Psych: Pressured speech, intermittently agitated, speaks in short sentences and does not respond to many questions  Assessment/Plan:  Venous stasis ulcer ([place]) DME order for wound supplies printed and faxed to [facility].  Patient made appointment for 9/2 at 4 with Dr. Tammi Klippel, per her request, as PCP is not available that day.  Patient asked multiple times if she would like to speak with social work, she declines.  Patient's care continues to be difficult given her inability to cooperate with questioning and present office.  Patient has been discharged from all home health agencies in the area.  DSS report recently filed.  It is very difficult to discuss with patient her mental health, as when this is brought up, she gets very angry and will hang up the phone.  We  will continue to attempt to reassess, as she would likely benefit from psychiatry referral, barrier is her willingness.    Time spent during visit with patient: 11 minutes

## 2019-04-27 NOTE — Assessment & Plan Note (Signed)
DME order for wound supplies printed and faxed to [facility].  Patient made appointment for 9/2 at 23 with Dr. Tammi Klippel, per her request, as PCP is not available that day.  Patient asked multiple times if she would like to speak with social work, she declines.  Patient's care continues to be difficult given her inability to cooperate with questioning and present office.  Patient has been discharged from all home health agencies in the area.  DSS report recently filed.  It is very difficult to discuss with patient her mental health, as when this is brought up, she gets very angry and will hang up the phone.  We will continue to attempt to reassess, as she would likely benefit from psychiatry referral, barrier is her willingness.

## 2019-04-29 ENCOUNTER — Ambulatory Visit: Payer: Medicare Other | Admitting: Family Medicine

## 2019-05-01 ENCOUNTER — Telehealth: Payer: Self-pay

## 2019-05-01 NOTE — Telephone Encounter (Signed)
Called and spoke with patient.  Patient congenial over the phone and aware that she has appointment with me on Tuesday.  States that she will be present.  States that she feels well, she is just concerned that she has "maggots" in her leg wounds.  Stated that she just ate and is otherwise doing well.  Reviewed that if she is febrile, not feeling well, or confused, she needs to be seen in ED immediately.  She voiced understanding.  This was repeated to her multiple times and she voiced understanding multiple times.  [facility] has released patient, therefore unsure if they would be able to provide assistance.  Patient also needs products delivered which is a complicating factor.  Plan to discuss at appointment on 9/8.  Called preceptor room and discussed plan with Dr. McDiarmid, who agreed with this plan.

## 2019-05-01 NOTE — Telephone Encounter (Signed)
Patient calls nurse line stating she never received her DME supplies for her wounds. I called [facility] to see what the issue is, they unfortunately do not so those types of requests. He suggested [facility]. I am unaware how this will work, given she has been discharged from [facility]. Patient did say she was coming in on Tuesday, although she seemed confused. Will forward to PCP.

## 2019-05-03 ENCOUNTER — Inpatient Hospital Stay (HOSPITAL_COMMUNITY)
Admission: EM | Admit: 2019-05-03 | Discharge: 2019-05-22 | DRG: 603 | Disposition: A | Discharge status: Skilled Nursing Facility | Payer: Medicare Other | Attending: Family Medicine | Admitting: Family Medicine

## 2019-05-03 ENCOUNTER — Other Ambulatory Visit: Payer: Self-pay

## 2019-05-03 ENCOUNTER — Encounter (HOSPITAL_COMMUNITY): Payer: Self-pay | Admitting: Oncology

## 2019-05-03 DIAGNOSIS — I480 Paroxysmal atrial fibrillation: Secondary | ICD-10-CM | POA: Diagnosis not present

## 2019-05-03 DIAGNOSIS — F319 Bipolar disorder, unspecified: Secondary | ICD-10-CM | POA: Diagnosis present

## 2019-05-03 DIAGNOSIS — G14 Postpolio syndrome: Secondary | ICD-10-CM | POA: Diagnosis present

## 2019-05-03 DIAGNOSIS — I959 Hypotension, unspecified: Secondary | ICD-10-CM | POA: Diagnosis not present

## 2019-05-03 DIAGNOSIS — N179 Acute kidney failure, unspecified: Secondary | ICD-10-CM | POA: Diagnosis not present

## 2019-05-03 DIAGNOSIS — E860 Dehydration: Secondary | ICD-10-CM | POA: Diagnosis not present

## 2019-05-03 DIAGNOSIS — F17210 Nicotine dependence, cigarettes, uncomplicated: Secondary | ICD-10-CM | POA: Diagnosis present

## 2019-05-03 DIAGNOSIS — L03115 Cellulitis of right lower limb: Secondary | ICD-10-CM | POA: Diagnosis present

## 2019-05-03 DIAGNOSIS — L03116 Cellulitis of left lower limb: Secondary | ICD-10-CM | POA: Diagnosis not present

## 2019-05-03 DIAGNOSIS — Z885 Allergy status to narcotic agent status: Secondary | ICD-10-CM | POA: Diagnosis not present

## 2019-05-03 DIAGNOSIS — G894 Chronic pain syndrome: Secondary | ICD-10-CM

## 2019-05-03 DIAGNOSIS — I83012 Varicose veins of right lower extremity with ulcer of calf: Secondary | ICD-10-CM

## 2019-05-03 DIAGNOSIS — Z1611 Resistance to penicillins: Secondary | ICD-10-CM | POA: Diagnosis present

## 2019-05-03 DIAGNOSIS — D509 Iron deficiency anemia, unspecified: Secondary | ICD-10-CM | POA: Diagnosis not present

## 2019-05-03 DIAGNOSIS — M1990 Unspecified osteoarthritis, unspecified site: Secondary | ICD-10-CM | POA: Diagnosis present

## 2019-05-03 DIAGNOSIS — F603 Borderline personality disorder: Secondary | ICD-10-CM | POA: Diagnosis present

## 2019-05-03 DIAGNOSIS — Z609 Problem related to social environment, unspecified: Secondary | ICD-10-CM

## 2019-05-03 DIAGNOSIS — L98499 Non-pressure chronic ulcer of skin of other sites with unspecified severity: Secondary | ICD-10-CM

## 2019-05-03 DIAGNOSIS — B9620 Unspecified Escherichia coli [E. coli] as the cause of diseases classified elsewhere: Secondary | ICD-10-CM | POA: Diagnosis present

## 2019-05-03 DIAGNOSIS — L97819 Non-pressure chronic ulcer of other part of right lower leg with unspecified severity: Secondary | ICD-10-CM | POA: Diagnosis present

## 2019-05-03 DIAGNOSIS — Z9119 Patient's noncompliance with other medical treatment and regimen: Secondary | ICD-10-CM

## 2019-05-03 DIAGNOSIS — Z886 Allergy status to analgesic agent status: Secondary | ICD-10-CM

## 2019-05-03 DIAGNOSIS — Z20828 Contact with and (suspected) exposure to other viral communicable diseases: Secondary | ICD-10-CM | POA: Diagnosis present

## 2019-05-03 DIAGNOSIS — A0472 Enterocolitis due to Clostridium difficile, not specified as recurrent: Secondary | ICD-10-CM | POA: Diagnosis not present

## 2019-05-03 DIAGNOSIS — I83018 Varicose veins of right lower extremity with ulcer other part of lower leg: Secondary | ICD-10-CM | POA: Diagnosis present

## 2019-05-03 DIAGNOSIS — F4325 Adjustment disorder with mixed disturbance of emotions and conduct: Secondary | ICD-10-CM

## 2019-05-03 DIAGNOSIS — I83009 Varicose veins of unspecified lower extremity with ulcer of unspecified site: Secondary | ICD-10-CM | POA: Diagnosis present

## 2019-05-03 DIAGNOSIS — L97829 Non-pressure chronic ulcer of other part of left lower leg with unspecified severity: Secondary | ICD-10-CM | POA: Diagnosis not present

## 2019-05-03 DIAGNOSIS — L089 Local infection of the skin and subcutaneous tissue, unspecified: Secondary | ICD-10-CM

## 2019-05-03 DIAGNOSIS — N390 Urinary tract infection, site not specified: Secondary | ICD-10-CM | POA: Diagnosis not present

## 2019-05-03 DIAGNOSIS — L97219 Non-pressure chronic ulcer of right calf with unspecified severity: Secondary | ICD-10-CM | POA: Diagnosis not present

## 2019-05-03 DIAGNOSIS — N3000 Acute cystitis without hematuria: Secondary | ICD-10-CM | POA: Diagnosis not present

## 2019-05-03 DIAGNOSIS — E1165 Type 2 diabetes mellitus with hyperglycemia: Secondary | ICD-10-CM

## 2019-05-03 DIAGNOSIS — G4700 Insomnia, unspecified: Secondary | ICD-10-CM | POA: Diagnosis present

## 2019-05-03 DIAGNOSIS — F1411 Cocaine abuse, in remission: Secondary | ICD-10-CM | POA: Diagnosis present

## 2019-05-03 DIAGNOSIS — I83028 Varicose veins of left lower extremity with ulcer other part of lower leg: Secondary | ICD-10-CM | POA: Diagnosis present

## 2019-05-03 LAB — CBC WITH DIFFERENTIAL/PLATELET
Abs Immature Granulocytes: 0.09 10*3/uL — ABNORMAL HIGH (ref 0.00–0.07)
Basophils Absolute: 0.1 10*3/uL (ref 0.0–0.1)
Basophils Relative: 1 %
Eosinophils Absolute: 0.3 10*3/uL (ref 0.0–0.5)
Eosinophils Relative: 2 %
HCT: 28.4 % — ABNORMAL LOW (ref 36.0–46.0)
Hemoglobin: 8.7 g/dL — ABNORMAL LOW (ref 12.0–15.0)
Immature Granulocytes: 1 %
Lymphocytes Relative: 13 %
Lymphs Abs: 1.5 10*3/uL (ref 0.7–4.0)
MCH: 28.6 pg (ref 26.0–34.0)
MCHC: 30.6 g/dL (ref 30.0–36.0)
MCV: 93.4 fL (ref 80.0–100.0)
Monocytes Absolute: 1 10*3/uL (ref 0.1–1.0)
Monocytes Relative: 8 %
Neutro Abs: 9.3 10*3/uL — ABNORMAL HIGH (ref 1.7–7.7)
Neutrophils Relative %: 75 %
Platelets: 446 10*3/uL — ABNORMAL HIGH (ref 150–400)
RBC: 3.04 MIL/uL — ABNORMAL LOW (ref 3.87–5.11)
RDW: 14.1 % (ref 11.5–15.5)
WBC: 12.2 10*3/uL — ABNORMAL HIGH (ref 4.0–10.5)
nRBC: 0 % (ref 0.0–0.2)

## 2019-05-03 MED ORDER — HYDROMORPHONE HCL 1 MG/ML IJ SOLN
1.0000 mg | Freq: Once | INTRAMUSCULAR | Status: AC
  Administered 2019-05-03: 1 mg via INTRAVENOUS
  Filled 2019-05-03: qty 1

## 2019-05-03 NOTE — ED Provider Notes (Addendum)
[facility]   CSN: [id number] Arrival date & time: 05/03/19  2126     History   Chief Complaint Chief Complaint  Patient presents with  . Recurrent Skin Infections    HPI [name] is a 68 y.o. female with a history of bipolar 1 disorder, and venous stasis ulcers who presents the emergency department with a chief complaint of right lower leg pain.  The patient endorses constant, worsening pain in the right lower leg.  She reports that she has had chronic wounds to the bilateral lower legs that she has been treating at home.  She reports that the pain in the right lower leg significantly worsened today and that she developed numbness.  She states "I think I had the flu a few weeks ago because I was having fever and chills", but reports this is since resolved.  No abdominal pain, nausea, vomiting, diarrhea, headache, dizziness.  Triage note indicates that the patient has not showered in more than 4 months.  Per chart review, the patient called her primary care provider's office 3 days ago due to concern that she has "maggots" in her leg wounds.  She was advised to come to the ER immediately.  She was noted to seem confused on the phone on 9/4 when she called to state that she had never received her DME supplies for her wound. She has been discharged from multiple home health agencies.  Spoke with Leighton ParodyGina, fianc of the patient's emergency contact, Morene AntuSam Harris.  She reports that Doreatha MartinSam has been ill.  She reports the couple has been able to assist with helping to care for the patient in the past, but they are currently unable to do so.  Patient is agitated, crying, and yelling at staff. Patient lives alone- "Aren't we all alone?"  Level 5 caveat secondary to the condition of the patient for agitation.      The history is provided by the patient. No language interpreter was used.    Past Medical History:  Diagnosis Date  . Arthritis    . Hypoglycemia   . Pneumonia, pneumococcal [facility](HCC)     Patient Active Problem List   Diagnosis Date Noted  . Adjustment disorder with mixed disturbance of emotions and conduct   . Type 2 diabetes mellitus with hyperglycemia (HCC) 05/04/2019  . AKI (acute kidney injury) (HCC)   . Infected ulcer of skin (HCC)   . High risk social situation   . Post-polio syndrome   . Chronic pain syndrome   . Limited mobility 04/20/2019  . Venous stasis ulcer (HCC) 03/31/2019  . Overgrown toenails 10/28/2018  . Wound of right leg 02/06/2018  . Atrial fibrillation (HCC) 01/03/2018  . Anemia 01/03/2018  . Elevated serum creatinine 01/03/2018  . Bipolar I disorder with mania (HCC) 01/03/2018    Past Surgical History:  Procedure Laterality Date  . ABDOMINAL HYSTERECTOMY    . CESAREAN SECTION       OB History    Gravida      Para      Term      Preterm      AB      Living  3     SAB      TAB      Ectopic      Multiple      Live Births               Home Medications    Prior  to Admission medications   Medication Sig Start Date End Date Taking? Authorizing Provider  furosemide (LASIX) 20 MG tablet Take 0.5 tablets (10 mg total) by mouth daily. Patient not taking: Reported on 04/18/2018 12/10/17 05/04/19  [name], PA-C    Family History No family history on file.  Social History Social History   Tobacco Use  . Smoking status: Heavy Tobacco Smoker    Packs/day: 0.50  . Smokeless tobacco: Never Used  Substance Use Topics  . Alcohol use: No  . Drug use: No     Allergies   Codeine and Tylenol [acetaminophen]   Review of Systems Review of Systems  Unable to perform ROS: Acuity of condition  Constitutional: Negative for chills and fever.  Musculoskeletal: Positive for arthralgias and myalgias. Negative for joint swelling.  Skin: Positive for color change and wound.  Neurological: Positive for numbness. Negative for weakness.   Physical Exam Updated  Vital Signs BP (!) 110/58 (BP Location: Right Arm)   Pulse 90   Temp 97.7 F (36.5 C) (Oral)   Resp 18   Ht 5' 6.5" (1.689 m)   Wt 79.5 kg   SpO2 97%   BMI 27.86 kg/m   Physical Exam Vitals signs and nursing note reviewed.  Constitutional:      General: She is not in acute distress.    Comments: Disheveled.  HENT:     Head: Normocephalic.  Eyes:     Conjunctiva/sclera: Conjunctivae normal.  Neck:     Musculoskeletal: Neck supple.  Cardiovascular:     Rate and Rhythm: Normal rate and regular rhythm.     Heart sounds: No murmur. No friction rub. No gallop.   Pulmonary:     Effort: Pulmonary effort is normal. No respiratory distress.  Abdominal:     General: There is no distension.     Palpations: Abdomen is soft.  Musculoskeletal:     Comments: There is a large, foul swelling wound noted to the right lower extremity. See picture below. There is a large amount of purulent discharge.  There is surrounding erythema and warmth.  Warmth and skin breakdown noted to a large venous stasis ulcer on the left lower extremity.  DP pulses palpable by Doppler on the right.  2+ on the left.  Sensation to sharp touch appears to be intact.  Significant pain with movement of the legs.  Skin:    General: Skin is warm.     Findings: No rash.  Neurological:     Mental Status: She is alert.  Psychiatric:        Behavior: Behavior normal.   Bilateral lower extremities   Right lower leg    Left lower leg     ED Treatments / Results  Labs (all labs ordered are listed, but only abnormal results are displayed) Labs Reviewed  URINE CULTURE - Abnormal; Notable for the following components:      Result Value   Culture >=100,000 COLONIES/mL ESCHERICHIA COLI (*)    Organism ID, Bacteria ESCHERICHIA COLI (*)    All other components within normal limits  AEROBIC CULTURE (SUPERFICIAL SPECIMEN) - Abnormal; Notable for the following components:   Culture MULTIPLE ORGANISMS PRESENT, NONE  PREDOMINANT (*)    All other components within normal limits  C DIFFICILE QUICK SCREEN W PCR REFLEX - Abnormal; Notable for the following components:   C Diff antigen POSITIVE (*)    C Diff toxin POSITIVE (*)    All other components within normal limits  CBC  WITH DIFFERENTIAL/PLATELET - Abnormal; Notable for the following components:   WBC 12.2 (*)    RBC 3.04 (*)    Hemoglobin 8.7 (*)    HCT 28.4 (*)    Platelets 446 (*)    Neutro Abs 9.3 (*)    Abs Immature Granulocytes 0.09 (*)    All other components within normal limits  COMPREHENSIVE METABOLIC PANEL - Abnormal; Notable for the following components:   Glucose, Bld 141 (*)    BUN 36 (*)    Creatinine, Ser 2.12 (*)    Calcium 8.7 (*)    Albumin 2.5 (*)    GFR calc non Af Amer 23 (*)    GFR calc Af Amer 27 (*)    All other components within normal limits  URINALYSIS, ROUTINE W REFLEX MICROSCOPIC - Abnormal; Notable for the following components:   APPearance TURBID (*)    Specific Gravity, Urine 1.031 (*)    Protein, ur 100 (*)    Nitrite POSITIVE (*)    Leukocytes,Ua TRACE (*)    Bacteria, UA MANY (*)    All other components within normal limits  BASIC METABOLIC PANEL - Abnormal; Notable for the following components:   Glucose, Bld 128 (*)    BUN 32 (*)    Creatinine, Ser 1.68 (*)    Calcium 8.3 (*)    GFR calc non Af Amer 31 (*)    GFR calc Af Amer 36 (*)    All other components within normal limits  CBC - Abnormal; Notable for the following components:   WBC 11.6 (*)    RBC 3.32 (*)    Hemoglobin 9.3 (*)    HCT 31.2 (*)    MCHC 29.8 (*)    All other components within normal limits  HEMOGLOBIN A1C - Abnormal; Notable for the following components:   Hgb A1c MFr Bld 6.6 (*)    All other components within normal limits  GLUCOSE, CAPILLARY - Abnormal; Notable for the following components:   Glucose-Capillary 106 (*)    All other components within normal limits  C-REACTIVE PROTEIN - Abnormal; Notable for the  following components:   CRP 16.2 (*)    All other components within normal limits  CBC - Abnormal; Notable for the following components:   RBC 2.96 (*)    Hemoglobin 8.4 (*)    HCT 27.8 (*)    All other components within normal limits  BASIC METABOLIC PANEL - Abnormal; Notable for the following components:   CO2 20 (*)    Creatinine, Ser 1.22 (*)    Calcium 8.1 (*)    GFR calc non Af Amer 45 (*)    GFR calc Af Amer 53 (*)    All other components within normal limits  IRON AND TIBC - Abnormal; Notable for the following components:   Iron 18 (*)    TIBC 147 (*)    All other components within normal limits  RETICULOCYTES - Abnormal; Notable for the following components:   RBC. 2.96 (*)    All other components within normal limits  CBC WITH DIFFERENTIAL/PLATELET - Abnormal; Notable for the following components:   RBC 2.81 (*)    Hemoglobin 8.1 (*)    HCT 26.1 (*)    All other components within normal limits  BASIC METABOLIC PANEL - Abnormal; Notable for the following components:   Glucose, Bld 129 (*)    BUN 26 (*)    Creatinine, Ser 1.29 (*)    Calcium 8.1 (*)  GFR calc non Af Amer 43 (*)    GFR calc Af Amer 49 (*)    All other components within normal limits  CBC - Abnormal; Notable for the following components:   RBC 3.15 (*)    Hemoglobin 8.8 (*)    HCT 29.0 (*)    All other components within normal limits  BASIC METABOLIC PANEL - Abnormal; Notable for the following components:   Glucose, Bld 104 (*)    BUN 24 (*)    Creatinine, Ser 1.14 (*)    Calcium 8.5 (*)    GFR calc non Af Amer 49 (*)    GFR calc Af Amer 57 (*)    All other components within normal limits  CBC - Abnormal; Notable for the following components:   RBC 2.91 (*)    Hemoglobin 8.2 (*)    HCT 27.2 (*)    All other components within normal limits  BASIC METABOLIC PANEL - Abnormal; Notable for the following components:   Chloride 97 (*)    Calcium 8.8 (*)    All other components within normal  limits  BASIC METABOLIC PANEL - Abnormal; Notable for the following components:   GFR calc non Af Amer 58 (*)    All other components within normal limits  CBC - Abnormal; Notable for the following components:   RBC 3.23 (*)    Hemoglobin 9.3 (*)    HCT 30.4 (*)    All other components within normal limits  CULTURE, BLOOD (ROUTINE X 2)  CULTURE, BLOOD (ROUTINE X 2)  SARS CORONAVIRUS 2 (HOSPITAL ORDER, PERFORMED IN [facility])  LACTIC ACID, PLASMA  HIV ANTIBODY (ROUTINE TESTING W REFLEX)  FERRITIN    EKG None  Radiology No results found.  Procedures .Critical Care Performed by: Barkley Boards, PA-C Authorized by: Barkley Boards, PA-C   Critical care provider statement:    Critical care time (minutes):  40   Critical care time was exclusive of:  Separately billable procedures and treating other patients and teaching time   Critical care was necessary to treat or prevent imminent or life-threatening deterioration of the following conditions:  Sepsis   Critical care was time spent personally by me on the following activities:  Ordering and performing treatments and interventions, ordering and review of laboratory studies, ordering and review of radiographic studies, pulse oximetry, re-evaluation of patient's condition, review of old charts, obtaining history from patient or surrogate, evaluation of patient's response to treatment, development of treatment plan with patient or surrogate and examination of patient   I assumed direction of critical care for this patient from another provider in my specialty: no     (including critical care time)  Medications Ordered in ED Medications  heparin injection 5,000 Units (5,000 Units Subcutaneous Given 05/18/19 0556)  traZODone (DESYREL) tablet 50 mg (50 mg Oral Given 05/17/19 2208)  0.9 %  sodium chloride infusion ( Intravenous Stopped 05/04/19 2351)  sodium hypochlorite (DAKIN'S 1/4 STRENGTH) topical solution ( Irrigation  Not Given 05/06/19 1200)  ferrous sulfate tablet 325 mg (325 mg Oral Given 05/17/19 1021)  DULoxetine (CYMBALTA) DR capsule 30 mg (30 mg Oral Given 05/17/19 1021)  gabapentin (NEURONTIN) capsule 400 mg (400 mg Oral Given 05/17/19 2208)  traMADol (ULTRAM) tablet 50 mg (has no administration in time range)  vancomycin (VANCOCIN) 50 mg/mL oral solution 125 mg (125 mg Oral Given 05/17/19 2209)  HYDROmorphone (DILAUDID) injection 1 mg (1 mg Intravenous Given 05/03/19 2337)  LORazepam (ATIVAN)  injection 0.5 mg (0.5 mg Intravenous Given 05/04/19 0048)  ceFEPIme (MAXIPIME) 2 g in sodium chloride 0.9 % 100 mL IVPB (0 g Intravenous Stopped 05/04/19 0208)  sodium chloride 0.9 % bolus 1,000 mL (0 mLs Intravenous Stopped 05/04/19 0734)  sodium chloride 0.9 % bolus 1,000 mL ( Intravenous Restarted 05/05/19 0127)  cephALEXin (KEFLEX) capsule 500 mg (500 mg Oral Given 05/13/19 1637)  traMADol (ULTRAM) tablet 50 mg (50 mg Oral Given 05/09/19 0455)  traMADol (ULTRAM) tablet 100 mg (100 mg Oral Given 05/10/19 2309)     Initial Impression / Assessment and Plan / ED Course  I have reviewed the triage vital signs and the nursing notes.  Pertinent labs & imaging results that were available during my care of the patient were reviewed by me and considered in my medical decision making (see chart for details).        68 year old female with a history of bipolar 1 disorder, and venous stasis ulcers who presents to the emergency department from home with chronic venous stasis ulcers to the bilateral lower extremities, but notes significantly worsening pain in the right lower wound with numbness in the right lower leg over the last day.  In the ER, she is tachycardic in the 110s on arrival.  Normotensive and no hypoxia.  Oral temp is 99.1.  She denies recent fever or chills, but did note that she "had the flu" a few weeks ago.  There is a foul odor coming from the room and the patient appears very disheveled.  She is agitated, crying,  and verbally assaulting staff.  Patient is able to be directed with soft verbal commands.  IV Ativan has also been ordered to help with agitation.  Please see photos above for wounds to the bilateral lower extremities.  Wound on the right is significantly worse and there appears to be a gangrenous area.  She does have palpable pulses by Doppler on the right and sensation is intact.  Given the chronicity of the wounds and concern for osteomyelitis, x-ray of the bilateral lower extremities obtained, which is negative for osteomyelitis.   She has a mild leukocytosis of 12.  Lactate is normal.  Will initiate antibiotics for infected venous stasis ulcer with vancomycin and cefepime to cover for Pseudomonas and MRSA.  Urine also appears infectious.  Urine culture sent.  Should be covered by previously mentioned antibiotics.  She also has a new AKI with creatinine of 2.12, up from 0.98 1 year ago.  I suspect this is probably secondary to poor p.o. intake, which may be contributing to tachycardia.  She does not meet sepsis criteria.  The patient was seen and independently evaluated by Dr. Preston Fleeting, attending physician.  The patient is established with family medicine who has been contacted for admission. Spoke with Family Medicine resident, Dr. Talbert Forest, who will accept the patient for admission.  On patient recheck, as she had received Ativan IV, and agitation has significantly improved.  Patient was agreeable to COVID 19 testing without cursing or yelling at staff.  The patient appears reasonably stabilized for admission considering the current resources, flow, and capabilities available in the ED at this time, and I doubt any other [facility] requiring further screening and/or treatment in the ED prior to admission.  Final Clinical Impressions(s) / ED Diagnoses   Final diagnoses:  Infected ulcer of skin, unspecified ulcer stage (HCC)  Venous stasis ulcer of right calf, unspecified ulcer stage, unspecified  whether varicose veins present (HCC)  Acute cystitis without  hematuria  AKI (acute kidney injury) [facility])    ED Discharge Orders    None       Barkley Boards, PA-C 05/04/19 0136    Dione Booze, MD 05/04/19 0315    Lilian Kapur, Niki Payment A, PA-C 05/18/19 4098    Dione Booze, MD 05/18/19 [phone]

## 2019-05-03 NOTE — ED Triage Notes (Signed)
Pt bib [facility] from home d/t chronic b/l LE wounds. Pt reports not showering x 4 months.  B/L LE have open wounds w/ foul smelling purulent drainage.  Pt is refusing to let legs be touched.  Refusing to change into a gown. Pt is verbally aggressive.  States she is here to have right leg amputation.

## 2019-05-03 NOTE — ED Notes (Signed)
Pt screaming, "Where's my nurse?"  When asked if pt was willing to let this writer start IV line and draw blood pt replied, "I don't give a fuck about the blood."

## 2019-05-04 ENCOUNTER — Other Ambulatory Visit: Payer: Self-pay

## 2019-05-04 ENCOUNTER — Inpatient Hospital Stay (HOSPITAL_COMMUNITY): Payer: Medicare Other

## 2019-05-04 ENCOUNTER — Emergency Department (HOSPITAL_COMMUNITY): Payer: Medicare Other

## 2019-05-04 DIAGNOSIS — L97909 Non-pressure chronic ulcer of unspecified part of unspecified lower leg with unspecified severity: Secondary | ICD-10-CM

## 2019-05-04 DIAGNOSIS — Z20828 Contact with and (suspected) exposure to other viral communicable diseases: Secondary | ICD-10-CM | POA: Diagnosis present

## 2019-05-04 DIAGNOSIS — B9620 Unspecified Escherichia coli [E. coli] as the cause of diseases classified elsewhere: Secondary | ICD-10-CM | POA: Diagnosis present

## 2019-05-04 DIAGNOSIS — F319 Bipolar disorder, unspecified: Secondary | ICD-10-CM | POA: Diagnosis present

## 2019-05-04 DIAGNOSIS — I83012 Varicose veins of right lower extremity with ulcer of calf: Secondary | ICD-10-CM | POA: Diagnosis not present

## 2019-05-04 DIAGNOSIS — E1165 Type 2 diabetes mellitus with hyperglycemia: Secondary | ICD-10-CM

## 2019-05-04 DIAGNOSIS — F17210 Nicotine dependence, cigarettes, uncomplicated: Secondary | ICD-10-CM | POA: Diagnosis present

## 2019-05-04 DIAGNOSIS — L97219 Non-pressure chronic ulcer of right calf with unspecified severity: Secondary | ICD-10-CM | POA: Diagnosis present

## 2019-05-04 DIAGNOSIS — Z886 Allergy status to analgesic agent status: Secondary | ICD-10-CM | POA: Diagnosis not present

## 2019-05-04 DIAGNOSIS — G14 Postpolio syndrome: Secondary | ICD-10-CM

## 2019-05-04 DIAGNOSIS — L98499 Non-pressure chronic ulcer of skin of other sites with unspecified severity: Secondary | ICD-10-CM | POA: Diagnosis not present

## 2019-05-04 DIAGNOSIS — Z609 Problem related to social environment, unspecified: Secondary | ICD-10-CM

## 2019-05-04 DIAGNOSIS — N179 Acute kidney failure, unspecified: Secondary | ICD-10-CM

## 2019-05-04 DIAGNOSIS — G894 Chronic pain syndrome: Secondary | ICD-10-CM | POA: Diagnosis present

## 2019-05-04 DIAGNOSIS — N3000 Acute cystitis without hematuria: Secondary | ICD-10-CM | POA: Diagnosis present

## 2019-05-04 DIAGNOSIS — L089 Local infection of the skin and subcutaneous tissue, unspecified: Secondary | ICD-10-CM | POA: Diagnosis not present

## 2019-05-04 DIAGNOSIS — F4325 Adjustment disorder with mixed disturbance of emotions and conduct: Secondary | ICD-10-CM | POA: Diagnosis present

## 2019-05-04 DIAGNOSIS — L98491 Non-pressure chronic ulcer of skin of other sites limited to breakdown of skin: Secondary | ICD-10-CM | POA: Diagnosis not present

## 2019-05-04 DIAGNOSIS — Z1611 Resistance to penicillins: Secondary | ICD-10-CM | POA: Diagnosis present

## 2019-05-04 DIAGNOSIS — N390 Urinary tract infection, site not specified: Secondary | ICD-10-CM | POA: Diagnosis present

## 2019-05-04 DIAGNOSIS — E860 Dehydration: Secondary | ICD-10-CM | POA: Diagnosis present

## 2019-05-04 DIAGNOSIS — L97819 Non-pressure chronic ulcer of other part of right lower leg with unspecified severity: Secondary | ICD-10-CM | POA: Diagnosis present

## 2019-05-04 DIAGNOSIS — D509 Iron deficiency anemia, unspecified: Secondary | ICD-10-CM | POA: Diagnosis present

## 2019-05-04 DIAGNOSIS — M1990 Unspecified osteoarthritis, unspecified site: Secondary | ICD-10-CM | POA: Diagnosis present

## 2019-05-04 DIAGNOSIS — L97829 Non-pressure chronic ulcer of other part of left lower leg with unspecified severity: Secondary | ICD-10-CM | POA: Diagnosis present

## 2019-05-04 DIAGNOSIS — A0472 Enterocolitis due to Clostridium difficile, not specified as recurrent: Secondary | ICD-10-CM | POA: Diagnosis not present

## 2019-05-04 DIAGNOSIS — L03116 Cellulitis of left lower limb: Secondary | ICD-10-CM | POA: Diagnosis present

## 2019-05-04 DIAGNOSIS — I872 Venous insufficiency (chronic) (peripheral): Secondary | ICD-10-CM

## 2019-05-04 DIAGNOSIS — Z885 Allergy status to narcotic agent status: Secondary | ICD-10-CM | POA: Diagnosis not present

## 2019-05-04 DIAGNOSIS — L03115 Cellulitis of right lower limb: Secondary | ICD-10-CM | POA: Diagnosis present

## 2019-05-04 DIAGNOSIS — I480 Paroxysmal atrial fibrillation: Secondary | ICD-10-CM | POA: Diagnosis present

## 2019-05-04 DIAGNOSIS — Z9119 Patient's noncompliance with other medical treatment and regimen: Secondary | ICD-10-CM | POA: Diagnosis not present

## 2019-05-04 LAB — URINALYSIS, ROUTINE W REFLEX MICROSCOPIC
Bilirubin Urine: NEGATIVE
Glucose, UA: NEGATIVE mg/dL
Hgb urine dipstick: NEGATIVE
Ketones, ur: NEGATIVE mg/dL
Nitrite: POSITIVE — AB
Protein, ur: 100 mg/dL — AB
Specific Gravity, Urine: 1.031 — ABNORMAL HIGH (ref 1.005–1.030)
pH: 5 (ref 5.0–8.0)

## 2019-05-04 LAB — COMPREHENSIVE METABOLIC PANEL
ALT: 17 U/L (ref 0–44)
AST: 17 U/L (ref 15–41)
Albumin: 2.5 g/dL — ABNORMAL LOW (ref 3.5–5.0)
Alkaline Phosphatase: 75 U/L (ref 38–126)
Anion gap: 13 (ref 5–15)
BUN: 36 mg/dL — ABNORMAL HIGH (ref 8–23)
CO2: 22 mmol/L (ref 22–32)
Calcium: 8.7 mg/dL — ABNORMAL LOW (ref 8.9–10.3)
Chloride: 100 mmol/L (ref 98–111)
Creatinine, Ser: 2.12 mg/dL — ABNORMAL HIGH (ref 0.44–1.00)
GFR calc Af Amer: 27 mL/min — ABNORMAL LOW (ref >60)
GFR calc non Af Amer: 23 mL/min — ABNORMAL LOW (ref >60)
Glucose, Bld: 141 mg/dL — ABNORMAL HIGH (ref 70–99)
Potassium: 4.4 mmol/L (ref 3.5–5.1)
Sodium: 135 mmol/L (ref 135–145)
Total Bilirubin: 0.3 mg/dL (ref 0.3–1.2)
Total Protein: 7.9 g/dL (ref 6.5–8.1)

## 2019-05-04 LAB — HEMOGLOBIN A1C
Hgb A1c MFr Bld: 6.6 % — ABNORMAL HIGH (ref 4.8–5.6)
Mean Plasma Glucose: 142.72 mg/dL

## 2019-05-04 LAB — GLUCOSE, CAPILLARY: Glucose-Capillary: 106 mg/dL — ABNORMAL HIGH (ref 70–99)

## 2019-05-04 LAB — BASIC METABOLIC PANEL
Anion gap: 9 (ref 5–15)
BUN: 32 mg/dL — ABNORMAL HIGH (ref 8–23)
CO2: 25 mmol/L (ref 22–32)
Calcium: 8.3 mg/dL — ABNORMAL LOW (ref 8.9–10.3)
Chloride: 103 mmol/L (ref 98–111)
Creatinine, Ser: 1.68 mg/dL — ABNORMAL HIGH (ref 0.44–1.00)
GFR calc Af Amer: 36 mL/min — ABNORMAL LOW (ref >60)
GFR calc non Af Amer: 31 mL/min — ABNORMAL LOW (ref >60)
Glucose, Bld: 128 mg/dL — ABNORMAL HIGH (ref 70–99)
Potassium: 4.1 mmol/L (ref 3.5–5.1)
Sodium: 137 mmol/L (ref 135–145)

## 2019-05-04 LAB — CBC
HCT: 31.2 % — ABNORMAL LOW (ref 36.0–46.0)
Hemoglobin: 9.3 g/dL — ABNORMAL LOW (ref 12.0–15.0)
MCH: 28 pg (ref 26.0–34.0)
MCHC: 29.8 g/dL — ABNORMAL LOW (ref 30.0–36.0)
MCV: 94 fL (ref 80.0–100.0)
Platelets: 347 10*3/uL (ref 150–400)
RBC: 3.32 MIL/uL — ABNORMAL LOW (ref 3.87–5.11)
RDW: 14.1 % (ref 11.5–15.5)
WBC: 11.6 10*3/uL — ABNORMAL HIGH (ref 4.0–10.5)
nRBC: 0 % (ref 0.0–0.2)

## 2019-05-04 LAB — SARS CORONAVIRUS 2 BY RT PCR (HOSPITAL ORDER, PERFORMED IN ~~LOC~~ HOSPITAL LAB): SARS Coronavirus 2: NEGATIVE

## 2019-05-04 LAB — LACTIC ACID, PLASMA: Lactic Acid, Venous: 1.1 mmol/L (ref 0.5–1.9)

## 2019-05-04 MED ORDER — VANCOMYCIN HCL IN DEXTROSE 1-5 GM/200ML-% IV SOLN
1000.0000 mg | INTRAVENOUS | Status: DC
  Filled 2019-05-04: qty 200

## 2019-05-04 MED ORDER — TRAZODONE HCL 50 MG PO TABS
50.0000 mg | ORAL_TABLET | Freq: Every day | ORAL | Status: DC
  Administered 2019-05-04 – 2019-05-21 (×19): 50 mg via ORAL
  Filled 2019-05-04 (×19): qty 1

## 2019-05-04 MED ORDER — HEPARIN SODIUM (PORCINE) 5000 UNIT/ML IJ SOLN
5000.0000 [IU] | Freq: Three times a day (TID) | INTRAMUSCULAR | Status: DC
  Administered 2019-05-04 – 2019-05-22 (×47): 5000 [IU] via SUBCUTANEOUS
  Filled 2019-05-04 (×50): qty 1

## 2019-05-04 MED ORDER — SODIUM CHLORIDE 0.9 % IV SOLN
INTRAVENOUS | Status: DC
  Administered 2019-05-04 (×2): via INTRAVENOUS

## 2019-05-04 MED ORDER — TRAMADOL HCL 50 MG PO TABS
50.0000 mg | ORAL_TABLET | Freq: Four times a day (QID) | ORAL | Status: DC | PRN
  Administered 2019-05-04 – 2019-05-15 (×25): 50 mg via ORAL
  Filled 2019-05-04 (×26): qty 1

## 2019-05-04 MED ORDER — SODIUM CHLORIDE 0.9 % IV SOLN
2.0000 g | Freq: Once | INTRAVENOUS | Status: AC
  Administered 2019-05-04: 2 g via INTRAVENOUS
  Filled 2019-05-04: qty 2

## 2019-05-04 MED ORDER — SODIUM CHLORIDE 0.9 % IV BOLUS
1000.0000 mL | Freq: Once | INTRAVENOUS | Status: AC
  Administered 2019-05-04: 1000 mL via INTRAVENOUS

## 2019-05-04 MED ORDER — DAKINS (1/4 STRENGTH) 0.125 % EX SOLN
Freq: Every day | CUTANEOUS | Status: AC
  Administered 2019-05-04 – 2019-05-05 (×2)
  Filled 2019-05-04: qty 473

## 2019-05-04 MED ORDER — SODIUM CHLORIDE 0.9 % IV SOLN
1.0000 g | INTRAVENOUS | Status: DC
  Administered 2019-05-04 – 2019-05-05 (×2): 1 g via INTRAVENOUS
  Filled 2019-05-04 (×3): qty 1

## 2019-05-04 MED ORDER — SODIUM CHLORIDE 0.9 % IV SOLN
INTRAVENOUS | Status: AC
  Administered 2019-05-04: 18:00:00 via INTRAVENOUS

## 2019-05-04 MED ORDER — GABAPENTIN 300 MG PO CAPS
300.0000 mg | ORAL_CAPSULE | Freq: Three times a day (TID) | ORAL | Status: DC
  Administered 2019-05-04 – 2019-05-07 (×9): 300 mg via ORAL
  Filled 2019-05-04 (×9): qty 1

## 2019-05-04 MED ORDER — SODIUM CHLORIDE 0.9 % IV BOLUS: 1000.0000 mL | Freq: Once | INTRAVENOUS | Status: AC

## 2019-05-04 MED ORDER — LORAZEPAM 2 MG/ML IJ SOLN
0.5000 mg | Freq: Once | INTRAMUSCULAR | Status: AC
  Administered 2019-05-04: 01:00:00 0.5 mg via INTRAVENOUS
  Filled 2019-05-04: qty 1

## 2019-05-04 MED ORDER — VANCOMYCIN HCL 10 G IV SOLR
1500.0000 mg | Freq: Once | INTRAVENOUS | Status: DC
  Filled 2019-05-04: qty 1500

## 2019-05-04 MED ORDER — POLYETHYLENE GLYCOL 3350 17 G PO PACK: 17.0000 g | PACK | Freq: Every day | ORAL | Status: DC | PRN
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
## 2019-05-04 NOTE — Consult Note (Signed)
[facility] Nurse wound consult note Reason for Consult:Patient is agitated and wants out of bed to go home.  I reoriented her that she was in hospital and needed care for infection in her legs.  Wound type: infectious/chronic nonhealing venous stasis.  Pressure Injury POA: NA Measurement: Right lateral leg:  5 cm x 3 cm scabbed lesions Left leg with 2 cmx 2 cm scabbed lesion Both legs with edema and erythema noted to mid calf.  Chronic skin changes Wound NIO:[id number] tissue Drainage (amount, consistency, odor) minimal purulence noted  Musty odor Periwound:erythema, tender to touch Dressing procedure/placement/frequency: MD considering surgical consult.  WIll initiate Dakins to soften necrotic tissue and provide topical antimicrobial coverage.  On systemic antibiotics as well.  Cleanse lower legs with soap and water and pat dry  Apply Dakins to necrotic scabbed tissue.  COver with dry dressing, ABD and kerlix.  Secure with ace wrap from below toes to below knee.  Change daily. WIll reevalaute after three days.  [facility] team will follow.  Domenic Moras MSN, RN, FNP-BC CWON Wound, Ostomy, Continence Nurse Pager [phone]

## 2019-05-04 NOTE — Progress Notes (Signed)
Pharmacy Antibiotic Note  ELYSSE Phillips is a 68 y.o. female admitted on 05/03/2019 with wound infection.  Pharmacy has been consulted for cefepime and vancomycin dosing.  Plan: Cefepime 2gm x 1 then 1gm IV q24 hours Vancomycin 1500 mg IV x 1 then 1gm IV q36 hours F/u renal function, cultures and clinical course  Height: 5' 6.5" (168.9 cm) Weight: 176 lb (79.8 kg) IBW/kg (Calculated) : 60.45  Temp (24hrs), Avg:99.1 F (37.3 C), Min:99.1 F (37.3 C), Max:99.1 F (37.3 C)  Recent Labs  Lab 05/03/19 2331 05/03/19 2332  WBC 12.2*  --   CREATININE 2.12*  --   LATICACIDVEN  --  1.1    Estimated Creatinine Clearance: 27.3 mL/min (A) (by C-G formula based on SCr of 2.12 mg/dL (H)).    Allergies  Allergen Reactions  . Codeine Other (See Comments)    "Gets high"  . Tylenol [Acetaminophen] Hives    Thank you for allowing pharmacy to be a part of this patient's care.  [name] 05/04/2019 1:05 AM

## 2019-05-04 NOTE — H&P (Addendum)
[facility] Admission History and Physical Service Pager: [phone]  Patient name: [name] Medical record number: [id number] Date of birth: [birth year] Age: 68 y.o. Gender: female  Primary Care Provider: Cleophas Dunker, DO Consultants: TBD Code Status: Full  Preferred Emergency Contact: [name]  Chief Complaint: leg wounds  Assessment and Plan: [name] is a 68 y.o. female presenting with lower extremity pain secondary to chronic venous stasis ulcers. PMH is significant for atrial fibrillation, anemia and bipolar disorder.   Chronic Venous Stasis Ulcers On chart review, patient has longstanding history of chronic venous stasis ulcers.  Has been discharged from all home health agencies in the area and has had difficulty obtaining wound care supplies.  Per [name]'s note (04/23/2019), there is a recent DSS report for self-neglect due to the nature of patient's wound and home health agencies unwillingness to assist in care due to patient's verbal abuse.  Has wrapped her legs with tefla and used MediHoney in the past but has ran out of supplies.  Seen in the clinic early August and had foul smell and stated the smell had been there for months. There is documentation of wounds since April 2019. Has taken Lasix 20mg  for lower extremity edema in the past. Followed with wound care clinic previously, but patient known to be noncompliant and has had multiple "No shows" at [facility] for wound follow up. On exam today, bilateral venous stasis ulcers with copious malodorous drainage with surrounding erythema R>L, see pictures below. Wound, urine and blood cultures obtained in the ED and pending.  Patient started on IV Vanc and cefepime. Tibial-fibular x-ray did not show any acute bony abnormalities however unable to rule out osteomyelitis. Will obtain MRI tib-fib in the morning to r/o osteomyelitis.  -Admit to telemetry, attending Dr. Andria Phillips -Continue vancomycin,  pharmacy dosing -Continue cefepime, pharmacy dosing -Obtain MR Tib/Fib in the morning -Obtain CRP  -Follow-up on blood and wound cultures -Consult case management for complex wound care -Consult wound nurse for wound care evaluation -PT / OT consult  Acute Kidney Injury  Creatinine on admission was 2.12  Previous creatinine on 05/06/18 at 0.98. Likely etiology pre-renal as patient dehydrated. Tachycardic on exam with dry mucous membranes.  Avoid volume overload. Reassess fluid status and consider continuing maintenance IV fluids.  Continue to monitor creatinine. -Avoid nephrotoxic agents -daily BMPs  -Maintenance IV fluids normal saline - baseline creatinine 1.0, today 2.12 -NS @ 125mL/hr for 12 hours    AFIB Has refused blood work to determine etiology of A. Fib.  No current rate control, antiplatelet or anticoagulation treatment. Chads2-Vas score of 3.  Previously PCP has wanted to put patient on DOAC, however patient refused, she has a history of multiple falls, and there was concern for noncompliance with medication. -EKG obtained: HR 118 Sinus or ectopic atrial tachycardia -Cardiac telemetry -Vitals per routine -CBC in the a.m. -Heparin subcutaneous  Bipolar disorder depression Currently on not on any medication and has refused medical therapy in the past.  Per chart, patient was diagnosed when she was in school. Bipolar disorder likely causing barriers to patient's care.  Patient was recently discharged from [facility] due to being verbally aggressive and cursing at the staff, this behavior was exhibited in the ED as well.  Patient has not been open to talking about her mental health and has denied that there is a need or concern in the past.  -Would likely benefit from psychiatry referral however has been  resistant to this in the past.   Anemia  Patient has refused labs for 2 determinate etiology in the past.  Labs indicate normocytic anemia with hemoglobin 8.7,MCV 93.4.   -Hemoglobin 8.7 today, baseline 10-11 -CBC  -Consider iron studies  FEN/GI: heart healthy diet, MiraLAX Prophylaxis: Heparin   Disposition: Admit to telemetry, patient may be unsafe to go home alone and will need evaluation for possible SNF placement  History of Present Illness:  [name] is a 68 y.o. female presenting with leg wounds bilaterally. Patient reports that she was having itching, burning and pain in her legs.  Right leg has been hurting more than the left. She denies any abdominal, chest, neck, back and joint pain.  Denies fevers, nausea or vomiting, dysuria or shortness of breath.   In the ED, [name] wasuncooperative and verbally abusive to staff.  History limited as patient received 0.5mg   IV Ativan and 1 mg IV Dilaudid prior to interview.  ED work-up revealed leukocytosis with left shift (WBC 12.2, Neutro Abs 9.3), anemia (Hgb 8.7) and thrombocytosis (Plts 446), and hypoalbuminemia (2.5). Patient has AKI baseline Cr at 1.0 however today Cr 2.12.Bbilateral tibia-fibula plain films were negative for acute bony abnormalites.  Urinalysis indicative of UTI however was a dirty catch with 11-20 squamous epithelial. Normal lactic acid.  Wound, blood and urine cultures pending vancomycin and cefepime started for bilateral venous stasis ulcers.  Patient admitted for medical management of chronic venous stasis ulcers and will consult surgery in the morning for debridement.   Review Of Systems: Per HPI with the following additions:   Review of Systems  Constitutional: Negative for chills and fever.  HENT: Negative for congestion.   Respiratory: Negative for cough and shortness of breath.   Cardiovascular: Negative for chest pain.  Gastrointestinal: Negative for constipation.  Genitourinary: Negative for dysuria and frequency.  Musculoskeletal: Negative for back pain, joint pain and myalgias.  Skin:       Wound/ulcer bilateral lower extremities  Neurological: Negative for  headaches.  Psychiatric/Behavioral: The patient does not have insomnia.      Patient Active Problem List   Diagnosis Date Noted  . Limited mobility 04/20/2019  . Venous stasis ulcer (HCC) 03/31/2019  . Overgrown toenails 10/28/2018  . Wound of right leg 02/06/2018  . Atrial fibrillation (HCC) 01/03/2018  . Anemia 01/03/2018  . Elevated serum creatinine 01/03/2018  . Bipolar I disorder with mania (HCC) 01/03/2018    Past Medical History: Past Medical History:  Diagnosis Date  . Arthritis   . Hypoglycemia   . Pneumonia, pneumococcal [facility](HCC)     Past Surgical History: Past Surgical History:  Procedure Laterality Date  . ABDOMINAL HYSTERECTOMY    . CESAREAN SECTION    Appendectomy 1975  Social History: Social History   Tobacco Use  . Smoking status: Heavy Tobacco Smoker    Packs/day: 0.50  . Smokeless tobacco: Never Used  Substance Use Topics  . Alcohol use: No  . Drug use: No   Additional social history: 1/2 ppd for about 50 years.  Denies alcohol and illicit drug use. Lives alone in senior apartments, has no family, friends help take her to the store.  There is a concern related to patient's food security. Please also refer to relevant sections of EMR.  Family History: No family history on file. Per social work notes, patient reported no family   Allergies and Medications: Allergies  Allergen Reactions  . Codeine Other (See Comments)    "Gets high"  .  Tylenol [Acetaminophen] Hives   No current facility-administered medications on file prior to encounter.    Current Outpatient Medications on File Prior to Encounter  Medication Sig Dispense Refill  . [DISCONTINUED] furosemide (LASIX) 20 MG tablet Take 0.5 tablets (10 mg total) by mouth daily. (Patient not taking: Reported on 04/18/2018) 30 tablet 0    Objective: BP 129/63   Pulse (!) 113   Temp 99.1 F (37.3 C) (Oral)   Resp 20   Ht 5' 6.5" (1.689 m)   Wt 79.8 kg   SpO2 100%   BMI 27.98 kg/m    Exam: GEN:     Drowsy, unkept, cooperative and no acute distress    HENT:  mucus membranes dry, oropharyngeal without lesions or erythema  EYES:   pupils equal and reactive, EOM intact NECK:  normal ROM RESP:  clear to auscultation bilaterally, no increased work of breathing  CVS:  tachycardic and irregularly irregular rhythm, no murmur, distal pulses via Doppler  ABD:  soft, non-tender; bowel sounds present; no palpable masses EXT:   normal ROM, bilateral lower extremity venous stasis ulcers, malodorous purulent drainage NEURO: Sensation intact, drowsy s/p IV Ativan and IV Dilaudid Skin:  See description above and images below           Labs and Imaging: CBC BMET  Recent Labs  Lab 05/03/19 2331  WBC 12.2*  HGB 8.7*  HCT 28.4*  PLT 446*   Recent Labs  Lab 05/03/19 2331  NA 135  K 4.4  CL 100  CO2 22  BUN 36*  CREATININE 2.12*  GLUCOSE 141*  CALCIUM 8.7Katha Cabal, MD 05/04/2019, 5:00 AM PGY-1, [facility] FPTS Intern pager: [phone], text pages welcome  FPTS Upper-Level Resident Addendum   I have independently interviewed and examined the patient. I have discussed the above with the original author and agree with their documentation. My edits for correction/addition/clarification are in purple. Please see also any attending notes.    [place] Kataryna Mcquilkin, DO PGY-3, [facility] 05/04/2019 6:32 AM  FPTS Service pager: [phone] (text pages welcome through [facility])

## 2019-05-04 NOTE — Progress Notes (Addendum)
FPTS Interim Progress Note  S: Patient doing well this AM. Notes her legs are very painful. Denies any nausea, vomiting, CP, SOB, or other complaints. She is curious if they are going to have her legs cut off.   O: BP (!) 121/51 (BP Location: Left Arm)   Pulse (!) 121   Temp (!) 100.4 F (38 C) (Oral)   Resp 19   Ht 5' 6.5" (1.689 m)   Wt 77.2 kg   SpO2 99%   BMI 27.06 kg/m   General: older lady, no acute distress, overall pleasant this AM CV: elevated heart rate, no murmurs, rubs, or gallops, trace LE edema bilaterally although assessment is difficult given extent of wounds and swelling Lungs: clear to auscultation bilaterally with normal work of breathing Abdomen: soft, non-tender, non-distended, normoactive bowel sounds Skin/Extremities: deep cutaneous wound to lateral right LE with surrounding erythema and warm extending up and down leg, very tender to palpation, malodorous purulent drainage, left lower extremity with thickened scaly tissue with surrounding erythema and warmth extending up and down lower extremity, normal tone, 2+ pedal pulses bilatearlly Neuro: Alert and orientedx3, speech normal        A/P: Chronic Venous Stasis Ulcers  Fever Admitted overnight for treatment of bilateral chronic venous stasis that have failed to improve 2/2 to noncompliance. Cultures obtained and antibiotics started. Became febrile overnight to 100.4 with tachycardia (120's) otherwise vitals stable. No leukocytosis. Wound culture with abundant gram negative rods, gram positive cocci, and gram positive rods. Blood culture and urine culture pending.  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  - PMR consult for rehab options - Start Tramadol 50mg  q6 PRN - Gabapentin 300mg  TID   AKI: Cr 2.12 on admission, improved to 1.68 this AM (baseline ~1.0). Expect hypoperfusion contributing given improvement with fluids. Will continue to monitor and administer fluids when indicated - avoid nephrotoxic agents - AM BMP - continue to monitor - continue mIVF for 12hours  Anemia: Hemoglobin 9.3 this AM (Baseline  10-11). No signs of bleeding on exam. - f/u iron studies - daily CBC  Code Status: Discussed code status with patient this AM given her mental state on admission. She does request to be full code. This has been updated in chart.  Mina [place], DO 05/04/2019, 8:22 AM PGY-2, [place] [facility] Service pager [phone]

## 2019-05-04 NOTE — Progress Notes (Signed)
Pt transferred from ED to 4E27,appeared poor hygiene, drowsy, oriented x 4, quite irritable, but followed commands. Bilateral legs with chronic venous stasis ulcer are painful, malodorous drainage. Clean and put dry absorbant pad under legs.  CHG bath given, room and equipment instructions given, call bell within reach. CCMD called with 2nd person verified.  On arrival Pt had temperature 100.4, HR 121, sinus tachycardia on monitor,  SPO2 99% with room air, BP 121/51 mmHg. MD made aware. Order 1000 ml NSS bolus. Continue to monitor.  Kennyth Lose, RN

## 2019-05-04 NOTE — ED Notes (Signed)
Patient transported to X-ray 

## 2019-05-04 NOTE — ED Notes (Signed)
Pt refusing all monitoring equipment.  Pt is agitated again stating, "You're irritating me, get the fuck away from me."  Attempted to calm pt w/o success. Monitor turned off.

## 2019-05-04 NOTE — Progress Notes (Signed)
Inpatient Rehabilitation Admissions Coordinator  Therapy is recommending SNF rehab at this time. She lacks the supports at home to return home. Noted SW, Casimer Lanius , has made contact since July concerning  chronic issues at home. We will sign off.  Danne Baxter, RN, MSN Rehab Admissions Coordinator [phone] 05/04/2019 2:14 PM

## 2019-05-05 ENCOUNTER — Ambulatory Visit: Payer: Medicare Other | Admitting: Family Medicine

## 2019-05-05 DIAGNOSIS — L98499 Non-pressure chronic ulcer of skin of other sites with unspecified severity: Secondary | ICD-10-CM | POA: Diagnosis not present

## 2019-05-05 DIAGNOSIS — F4325 Adjustment disorder with mixed disturbance of emotions and conduct: Secondary | ICD-10-CM

## 2019-05-05 DIAGNOSIS — N179 Acute kidney failure, unspecified: Secondary | ICD-10-CM | POA: Diagnosis not present

## 2019-05-05 DIAGNOSIS — G894 Chronic pain syndrome: Secondary | ICD-10-CM | POA: Diagnosis not present

## 2019-05-05 DIAGNOSIS — L03116 Cellulitis of left lower limb: Secondary | ICD-10-CM | POA: Diagnosis not present

## 2019-05-05 DIAGNOSIS — N3000 Acute cystitis without hematuria: Secondary | ICD-10-CM | POA: Diagnosis not present

## 2019-05-05 LAB — BASIC METABOLIC PANEL
Anion gap: 9 (ref 5–15)
BUN: 23 mg/dL (ref 8–23)
CO2: 20 mmol/L — ABNORMAL LOW (ref 22–32)
Calcium: 8.1 mg/dL — ABNORMAL LOW (ref 8.9–10.3)
Chloride: 108 mmol/L (ref 98–111)
Creatinine, Ser: 1.22 mg/dL — ABNORMAL HIGH (ref 0.44–1.00)
GFR calc Af Amer: 53 mL/min — ABNORMAL LOW (ref >60)
GFR calc non Af Amer: 45 mL/min — ABNORMAL LOW (ref >60)
Glucose, Bld: 93 mg/dL (ref 70–99)
Potassium: 3.9 mmol/L (ref 3.5–5.1)
Sodium: 137 mmol/L (ref 135–145)

## 2019-05-05 LAB — CBC
HCT: 27.8 % — ABNORMAL LOW (ref 36.0–46.0)
Hemoglobin: 8.4 g/dL — ABNORMAL LOW (ref 12.0–15.0)
MCH: 28.4 pg (ref 26.0–34.0)
MCHC: 30.2 g/dL (ref 30.0–36.0)
MCV: 93.9 fL (ref 80.0–100.0)
Platelets: 304 10*3/uL (ref 150–400)
RBC: 2.96 MIL/uL — ABNORMAL LOW (ref 3.87–5.11)
RDW: 14.4 % (ref 11.5–15.5)
WBC: 7.2 10*3/uL (ref 4.0–10.5)
nRBC: 0 % (ref 0.0–0.2)

## 2019-05-05 LAB — C-REACTIVE PROTEIN: CRP: 16.2 mg/dL — ABNORMAL HIGH (ref <1.0)

## 2019-05-05 LAB — RETICULOCYTES
Immature Retic Fract: 12.2 % (ref 2.3–15.9)
RBC.: 2.96 MIL/uL — ABNORMAL LOW (ref 3.87–5.11)
Retic Count, Absolute: 42.9 10*3/uL (ref 19.0–186.0)
Retic Ct Pct: 1.5 % (ref 0.4–3.1)

## 2019-05-05 LAB — FERRITIN: Ferritin: 233 ng/mL (ref 11–307)

## 2019-05-05 LAB — HIV ANTIBODY (ROUTINE TESTING W REFLEX): HIV Screen 4th Generation wRfx: NONREACTIVE

## 2019-05-05 LAB — IRON AND TIBC
Iron: 18 ug/dL — ABNORMAL LOW (ref 28–170)
Saturation Ratios: 12 % (ref 10.4–31.8)
TIBC: 147 ug/dL — ABNORMAL LOW (ref 250–450)
UIBC: 129 ug/dL

## 2019-05-05 MED ORDER — SODIUM CHLORIDE 0.9 % IV SOLN
2.0000 g | Freq: Two times a day (BID) | INTRAVENOUS | Status: DC
  Administered 2019-05-05 – 2019-05-06 (×2): 2 g via INTRAVENOUS
  Filled 2019-05-05 (×3): qty 2

## 2019-05-05 MED ORDER — VANCOMYCIN HCL 10 G IV SOLR
1500.0000 mg | INTRAVENOUS | Status: DC
  Administered 2019-05-05: 1500 mg via INTRAVENOUS
  Filled 2019-05-05: qty 1500

## 2019-05-05 MED ORDER — FERROUS SULFATE 325 (65 FE) MG PO TABS
325.0000 mg | ORAL_TABLET | Freq: Every day | ORAL | Status: DC
  Administered 2019-05-06 – 2019-05-22 (×17): 325 mg via ORAL
  Filled 2019-05-05 (×17): qty 1

## 2019-05-05 NOTE — Progress Notes (Addendum)
Pharmacy Antibiotic Note  [name] is a 68 y.o. female admitted on 05/03/2019 with wound infection.  Pharmacy has been consulted for cefepime and vancomycin dosing.  Received cefepime 2 grams 9/7 then started 1 gram IV every 24 hours.  Loaded with vancomycin 1500 mg IV then started on 1000 mg IV every 36 hours. SCr improved to 1.22, new vancomycin dose calculated - 1500 mg Q36H, calculated AUC 457.  Plan: Increased vancomycin to 1500 mg IV every 36 hours. Increase cefepime 2gm IV q12 hours Monitor renal function, cultures and clinical course  Height: 5' 6.5" (168.9 cm) Weight: 170 lb 3.1 oz (77.2 kg) IBW/kg (Calculated) : 60.45  Temp (24hrs), Avg:99.8 F (37.7 C), Min:98.6 F (37 C), Max:101.2 F (38.4 C)  Recent Labs  Lab 05/03/19 2331 05/03/19 2332 05/04/19 0616 05/05/19 0321  WBC 12.2*  --  11.6* 7.2  CREATININE 2.12*  --  1.68* 1.22*  LATICACIDVEN  --  1.1  --   --     Estimated Creatinine Clearance: 46.8 mL/min (A) (by C-G formula based on SCr of 1.22 mg/dL (H)).    Allergies  Allergen Reactions  . Codeine Other (See Comments)    "Gets high"  . Tylenol [Acetaminophen] Hives       Thank you for allowing pharmacy to be a part of this patient's care.  Mimi L. Devin Going, PharmD, [place] PGY1 Pharmacy Resident 05/05/19      11:05 AM  Please check AMION for all [facility] phone numbers After 10:00 PM, call the [facility] [phone]   Addendum above Thank you for involving pharmacy in this patient's care.  Renold Genta, PharmD, BCPS Clinical Pharmacist Clinical phone for 05/05/2019 until 3p is [phone] 05/05/2019 4:23 PM  **Pharmacist phone directory can be found on [email or web address] listed under [place]**

## 2019-05-05 NOTE — Progress Notes (Signed)
Pt 's temperature 101.2, BP 93/42-100/40 mmHg, HR 100-110, Atrial fibrillation on monitor, SPO2 93-97 % room air.  Pt appeared drowsy after Tazodone and Tramadol administration, but easily to arouse and awake.   Notified Dr. Susa Simmonds about her vital signs, order received for 1000 ml NSS IV bolus. After 1000 ml IV fluid bolus, BP 92/51-106/71 mmHg, HR 90s-110.  She was verbally aggressive and used cursing words toward staff. We have to create boundary relationship in order to do nursing care for her, so she can become more compliant with caring.  Day shift RN reported daily dressing changed was done around 3.00pm, so we will hand off to RN day shift tomorrow. Dressing intact, dry and clean, continue to have strong foul smell.    We will continue to monitor.  Kennyth Lose, RN

## 2019-05-05 NOTE — Progress Notes (Signed)
Family Medicine Teaching Service Daily Progress Note Intern Pager: [phone]  Patient name: [name] Medical record number: [id number] Date of birth: [birth year] Age: 68 y.o. Gender: female  Primary Care Provider: Cleophas Dunker, DO Consultants: none Code Status: Full  Pt Overview and Major Events to Date:  9/7-admitted  9/8-UTI greater than 100,000 colony-forming units of E. coli  Assessment and Plan: [name] is a 68 y.o. female presenting with lower extremity pain secondary to chronic venous stasis ulcers. PMH is significant for atrial fibrillation, anemia and bipolar disorder.   Chronic Venous Stasis Ulcers  Fever Admitted for treatment of bilateral chronic venous stasis that have failed to improve 2/2 to noncompliance.  Patient self canceled MRI.  Currently on vancomycin and cefepime.  Became febrile overnight to 101.1 F with tachycardia (110's) otherwise vitals stable.  Decreased erythema on bilateral lower extremities, wounds still have foul odor but no purulent discharge.  No leukocytosis. Wound culture with abundant gram negative rods, gram positive cocci, and gram positive rods. Blood culture no growth x1 day.  Patient willing to go to SNF.  Plan to switch to p.o. antibiotics tomorrow if she remains afebrile. - Continue IV Vanc and Cefepime due to fevers overnight - follow up case management and wound care recs  -PMR signed off - elevate LE's  - Tramadol 50mg  q6 prn for pain   UTI Urine culture reveals greater than 100,000 colony-forming units of E. coli.  Cefepime will cover for this currently.  Awaiting sensitivities.  Plan to switch to p.o. antibiotics tomorrow if she remains afebrile. 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  They do not  recommend any medications as she declines them at this time.  They suggest not splitting staff and keeping clear boundaries. -Psychiatry signed off at this time.  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  - PMR agrees with PT/OT to SNF, patient willing.  Awaiting placement. - Start Tramadol 50mg  q6 PRN - Gabapentin 300mg  TID   AKI: Cr 2.12 on admission, improved to 1.22 this AM (baseline ~1.0). Expect hypoperfusion contributing given improvement with fluids.  Status post 1 L bolus overnight.  Will continue to monitor and administer fluids when indicated - avoid nephrotoxic agents - AM BMP - continue to monitor - continue mIVF for 12hours  Anemia: Hemoglobin 8.4 this AM (Baseline  10-11). No signs of bleeding on exam. Iron studies revealed low iron to 18, elevated TIBC to 129, ferritin high at 233 likely due to infection.  Likely iron deficiency anemia. -We will start ferrous sulfate p.o. at discharge - daily CBC  FEN/GI: Heart healthy diet, MiraLAX PPx: Heparin  Disposition: To SNF versus home depending on patient once transitioned to p.o. antibiotics and fever curve down  Subjective:  Patient feels much better this morning, decrease in leg pain.  She has decreased swelling and erythema, feet are well perfused.  Objective: Temp:  [98.6 F (37 C)-101.2 F (38.4 C)] 99.1 F (37.3 C) (09/08 0526) Pulse Rate:  [89-115] 100 (09/08 0600) Resp:  [17-23] 17 (09/08 0600) BP: (92-128)/(40-71) 104/50 (09/08 0600) SpO2:  [91 %-100 %] 94 % (09/08 0600) Physical Exam: General: Pleasant, resting comfortably in bed, no acute  distress Cardiovascular: Regular rate and rhythm, no murmur/rub/gallop Respiratory: Clear to auscultation bilaterally with normal work of breathing Abdomen: Soft, nontender, nondistended, normoactive bowel sounds Extremities: Cutaneous wound to lateral  right lower extremity, with surrounding erythema which has not spread outside of lines drawn yesterday, very tender to palpation, malodorous drainage, left lower extremity with thickened scaly tissue with surrounding erythema that has not extended outside of the boundaries, lower extremities both appropriately and equally warm, normal tone, 2+ pedal pulses bilaterally Neuro: AOx3, pleasant today, no splitting  Laboratory: Recent Labs  Lab 05/03/19 2331 05/04/19 0616 05/05/19 0321  WBC 12.2* 11.6* 7.2  HGB 8.7* 9.3* 8.4*  HCT 28.4* 31.2* 27.8*  PLT 446* 347 304   Recent Labs  Lab 05/03/19 2331 05/04/19 0616 05/05/19 0321  NA 135 137 137  K 4.4 4.1 3.9  CL 100 103 108  CO2 22 25 20*  BUN 36* 32* 23  CREATININE 2.12* 1.68* 1.22*  CALCIUM 8.7* 8.3* 8.1*  PROT 7.9  --   --   BILITOT 0.3  --   --   ALKPHOS 75  --   --   ALT 17  --   --   AST 17  --   --   GLUCOSE 141* 128* 93    Imaging/Diagnostic Tests: Dg Tibia/fibula Left  Result Date: 05/04/2019 CLINICAL DATA:  Bilateral lower extremity chronic wounds EXAM: LEFT TIBIA AND FIBULA - 2 VIEW COMPARISON:  None. FINDINGS: No acute bony abnormality. Specifically, no fracture, subluxation, or dislocation. Degenerative changes in the right knee, most pronounced in the medial and patellofemoral compartments. IMPRESSION: No acute bony abnormality. Electronically Signed   By: Charlett NoseKevin  Dover M.D.   On: 05/04/2019 00:48   Dg Tibia/fibula Right  Result Date: 05/04/2019 CLINICAL DATA:  Chronic wounds EXAM: RIGHT TIBIA AND FIBULA - 2 VIEW COMPARISON:  None. FINDINGS: Soft tissue defect noted over the lower lateral right calf. No acute bony abnormality. Specifically, no fracture, subluxation, or dislocation. No bone destruction to suggest osteomyelitis. IMPRESSION: No acute bony abnormality. Electronically Signed   By: Charlett NoseKevin  Dover M.D.   On: 05/04/2019 00:48    Shirlean MylarMahoney, Federick Levene, MD 05/05/2019, 8:11 AM PGY-1, [facility] FPT[facility]  Intern pager: [phone], text pages welcome

## 2019-05-05 NOTE — Plan of Care (Signed)
Continue to monitor

## 2019-05-05 NOTE — Progress Notes (Signed)
Bilateral lower leg dressing change done per MD order. Patient tolerated well.

## 2019-05-05 NOTE — TOC Initial Note (Signed)
Transition of Care [facility]) - Initial/Assessment Note    Patient Details  Name: [name] MRN: [id number] Date of Birth: 07/03/1951  [facility]) CM/SW Contact:    Alberteen Sam, [place] Phone Number: [phone] 05/05/2019, 10:19 AM  Clinical Narrative:                  CSW met with patient at bedside to review discharge planning and SNF recommendation for continued physical therapy rehab. Patient reports she currently lives in an apartment complex paying rent for $230 a month. She states she would be open to going to a [place] with preference of [place]. She reports she has medicare and medicaid. CSW notes only Medicaid information in chart. CSW informed patient that for Medicaid patients she would have to give [facility] her monthly check as payment for SNF, patient reports she cannot do this as her check goes to rent and food. Patient reports if she has to give up her check she will discharge home.   CSW is following up with [place] who has agreed to run [employer] to see if she has medicare in addition to [employer]. They will review and inform CSW if patient will have to give up medicaid check for stay at [facility]. CSW will inform patient of findings.   Expected Discharge Plan: Skilled Nursing Facility Barriers to Discharge: Continued Medical Work up   Patient Goals and CMS Choice Patient states their goals for this hospitalization and ongoing recovery are:: to go back to her apartment home CMS Medicare.gov Compare Post Acute Care list provided to:: Patient Choice offered to / list presented to : Patient  Expected Discharge Plan and Services Expected Discharge Plan: [place] Choice: [place] arrangements for the past 2 months: Single Family Home                                      Prior Living Arrangements/Services Living arrangements for the past 2  months: Single Family Home Lives with:: Self Patient language and need for interpreter reviewed:: Yes Do you feel safe going back to the place where you live?: Yes      Need for Family Participation in Patient Care: No (Comment) Care giver support system in place?: No (comment)   Criminal Activity/Legal Involvement Pertinent to Current Situation/Hospitalization: No - Comment as needed  Activities of Daily Living      Permission Sought/Granted Permission sought to share information with : Case Manager, Customer service manager, Family Supports Permission granted to share information with : Yes, Verbal Permission Granted     Permission granted to share info w AGENCY: SNFs        Emotional Assessment Appearance:: Appears stated age Attitude/Demeanor/Rapport: Gracious Affect (typically observed): Calm Orientation: : Oriented to Self, Oriented to Place, Oriented to  Time, Oriented to Situation Alcohol / Substance Use: Not Applicable Psych Involvement: No (comment)  Admission diagnosis:  Acute cystitis without hematuria [N30.00] AKI (acute kidney injury) ([place]) [N17.9] Infected ulcer of skin, unspecified ulcer stage ([place]) [L98.499, L08.9] Venous stasis ulcer of right calf, unspecified ulcer stage, unspecified whether varicose veins present ([place]) [I83.012, L97.219] Patient Active Problem List   Diagnosis Date Noted  . Type 2 diabetes mellitus with hyperglycemia ([place]) 05/04/2019  . AKI (acute kidney injury) ([place])   . Infected ulcer of skin (  [place])   . High risk social situation   . Post-polio syndrome   . Chronic pain syndrome   . Limited mobility 04/20/2019  . Venous stasis ulcer ([place]) 03/31/2019  . Overgrown toenails 10/28/2018  . Wound of right leg 02/06/2018  . Atrial fibrillation ([place]) 01/03/2018  . Anemia 01/03/2018  . Elevated serum creatinine 01/03/2018  . Bipolar I disorder with mania ([place]) 01/03/2018   PCP:  Cleophas Dunker, DO Pharmacy:  No Pharmacies  Listed    Social Determinants of Health (SDOH) Interventions    Readmission Risk Interventions No flowsheet data found.

## 2019-05-05 NOTE — NC FL2 (Signed)
[place] LEVEL OF CARE SCREENING TOOL     IDENTIFICATION  Patient Name: [name] Birthdate: 10-25-1950 Sex: female Admission Date (Current Location): 05/03/2019  [facility] and [place] Number:  Herbalist and Address:  [facility]. [facility], [place] [address], [place], [place] [address]      Provider Number: [id number]  Attending Physician Name and Address:  Zenia Resides, MD  Relative Name and Phone Number:  [name] [phone]    Current Level of Care: Hospital Recommended Level of Care: [place] Prior Approval Number:    Date Approved/Denied:   PASRR Number:    Discharge Plan: SNF    Current Diagnoses: Patient Active Problem List   Diagnosis Date Noted  . Type 2 diabetes mellitus with hyperglycemia ([place]) 05/04/2019  . AKI (acute kidney injury) ([place])   . Infected ulcer of skin ([place])   . High risk social situation   . Post-polio syndrome   . Chronic pain syndrome   . Limited mobility 04/20/2019  . Venous stasis ulcer ([place]) 03/31/2019  . Overgrown toenails 10/28/2018  . Wound of right leg 02/06/2018  . Atrial fibrillation ([place]) 01/03/2018  . Anemia 01/03/2018  . Elevated serum creatinine 01/03/2018  . Bipolar I disorder with mania ([place]) 01/03/2018    Orientation RESPIRATION BLADDER Height & Weight     Self, Time, Situation, Place  Normal Continent, External catheter Weight: 170 lb 3.1 oz (77.2 kg) Height:  5' 6.5" (168.9 cm)  BEHAVIORAL SYMPTOMS/MOOD NEUROLOGICAL BOWEL NUTRITION STATUS      Continent Diet(see discharge summary)  AMBULATORY STATUS COMMUNICATION OF NEEDS Skin   Extensive Assist Verbally (cellulitis left and right legs, weeping and cracking both legs, venous stasis ulcer pretibial left and right)                       Personal Care Assistance Level of Assistance  Bathing, Feeding, Dressing, Total care Bathing Assistance: Limited assistance Feeding assistance:  Independent Dressing Assistance: Limited assistance Total Care Assistance: Maximum assistance   Functional Limitations Info  Sight, Hearing, Speech Sight Info: Adequate Hearing Info: Adequate Speech Info: Adequate    SPECIAL CARE FACTORS FREQUENCY  PT (By licensed PT), OT (By licensed OT)     PT Frequency: min 5x weekly OT Frequency: min 5x weekly            Contractures Contractures Info: Not present    Additional Factors Info  Code Status, Allergies Code Status Info: full Allergies Info: Codeine, Tylenol (acetaminophen)           Current Medications (05/05/2019):  This is the current hospital active medication list Current Facility-Administered Medications  Medication Dose Route Frequency Provider Last Rate Last Dose  . ceFEPIme (MAXIPIME) 1 g in sodium chloride 0.9 % 100 mL IVPB  1 g Intravenous Q24H Lyndee Hensen, MD 200 mL/hr at 05/05/19 0141 1 g at 05/05/19 0141  . [START ON 05/06/2019] ferrous sulfate tablet 325 mg  325 mg Oral Q breakfast Mullis, Kiersten P, DO      . gabapentin (NEURONTIN) capsule 300 mg  300 mg Oral TID Kathrene Alu, MD   300 mg at 05/05/19 0843  . heparin injection 5,000 Units  5,000 Units Subcutaneous Q8H Lyndee Hensen, MD   5,000 Units at 05/05/19 0523  . polyethylene glycol (MIRALAX / GLYCOLAX) packet 17 g  17 g Oral Daily PRN Lyndee Hensen, MD      . sodium hypochlorite (  DAKIN'S 1/4 STRENGTH) topical solution   Irrigation Q1200 Hensel, Santiago BumpersWilliam A, MD      . traMADol (ULTRAM) tablet 50 mg  50 mg Oral Q6H PRN Lennox SoldersWinfrey, Amanda C, MD   50 mg at 05/05/19 0843  . traZODone (DESYREL) tablet 50 mg  50 mg Oral QHS Katha CabalBrimage, Vondra, MD   50 mg at 05/04/19 2037  . vancomycin (VANCOCIN) 1,500 mg in sodium chloride 0.9 % 500 mL IVPB  1,500 mg Intravenous Once Katha CabalBrimage, Vondra, MD   Stopped at 05/04/19 0403  . vancomycin (VANCOCIN) IVPB 1000 mg/200 mL premix  1,000 mg Intravenous Q36H Katha CabalBrimage, Vondra, MD         Discharge Medications: Please see  discharge summary for a list of discharge medications.  Relevant Imaging Results:  Relevant Lab Results:   Additional Information SSN: [id number]  Gildardo GriffesAshley M Charee Tumblin, LCSW

## 2019-05-05 NOTE — Consult Note (Addendum)
Telepsych Consultation   Reason for Consult:  "medication recommendations for possible bipolar disorder" Referring Physician:  Dr. Doralee Albino Location of Patient: [facility] Location of Provider: [facility]  Patient Identification: [name] MRN:  [id number] Principal Diagnosis: Adjustment disorder with mixed disturbance of emotions and conduct Diagnosis:  Active Problems:   Venous stasis ulcer (HCC)   AKI (acute kidney injury) (HCC)   Infected ulcer of skin (HCC)   High risk social situation   Post-polio syndrome   Chronic pain syndrome   Type 2 diabetes mellitus with hyperglycemia (HCC)   Total Time spent with patient: 1 hour  Subjective:   [name] is a 68 y.o. female patient admitted with chronic venous stasis ulcers and fever.  HPI:   Per chart review, patient was admitted with chronic venous stasis ulcers and fever secondary to noncompliance with treatment. She is receiving IV Vancomycin and Cefepime for wound infection.  Per nursing note on 9/7, "She was verbally aggressive and used cursing words toward staff. We have to create boundary relationship in order to do nursing care for her, so she can become more compliant with caring." She is recommended for SNF placement.   On interview, [name] reports, "I have wound problems that won't go away. Right now I'm going through a sensation." She reports ongoing pain due to her chronic wounds. She reports that she has been unable to ambulate and would like an Art gallery manager. She is agreeable to SNF placement. She reports intermittent depressed mood when she is alone. She reports a good support system and has friends that she can contact when needed. She denies SI, HI or AVH. She reports no problems with appetite or sleep. She denies active psychiatric symptoms and declines medication management at this time for depression which she feels is adequately controlled.   Past Psychiatric History: Depression   Risk  to Self:  None. Denies SI.  Risk to Others:  None. Denies HI.  Prior Inpatient Therapy:  Denies  Prior Outpatient Therapy:  Denies   Past Medical History:  Past Medical History:  Diagnosis Date  . Arthritis   . Hypoglycemia   . Pneumonia, pneumococcal [facility])     Past Surgical History:  Procedure Laterality Date  . ABDOMINAL HYSTERECTOMY    . CESAREAN SECTION     Family History: No family history on file. Family Psychiatric  History: Denies  Social History:  Social History   Substance and Sexual Activity  Alcohol Use No     Social History   Substance and Sexual Activity  Drug Use No    Social History   Socioeconomic History  . Marital status: Single    Spouse name: Not on file  . Number of children: Not on file  . Years of education: Not on file  . Highest education level: Not on file  Occupational History  . Not on file  Social Needs  . Financial resource strain: Not on file  . Food insecurity    Worry: Sometimes true    Inability: Sometimes true  . Transportation needs    Medical: Not on file    Non-medical: Not on file  Tobacco Use  . Smoking status: Heavy Tobacco Smoker    Packs/day: 0.50  . Smokeless tobacco: Never Used  Substance and Sexual Activity  . Alcohol use: No  . Drug use: No  . Sexual activity: Not on file  Lifestyle  . Physical activity    Days per week: Not on  file    Minutes per session: Not on file  . Stress: Not on file  Relationships  . Social Musicianconnections    Talks on phone: Not on file    Gets together: Not on file    Attends religious service: Not on file    Active member of club or organization: Not on file    Attends meetings of clubs or organizations: Not on file    Relationship status: Not on file  Other Topics Concern  . Not on file  Social History Narrative  . Not on file   Additional Social History: She lives at home alone. She denies alcohol or illicit substance use.     Allergies:   Allergies  Allergen  Reactions  . Codeine Other (See Comments)    "Gets high"  . Tylenol [Acetaminophen] Hives    Labs:  Results for orders placed or performed during the hospital encounter of 05/03/19 (from the past 48 hour(s))  Blood culture (routine x 2)     Status: None (Preliminary result)   Collection Time: 05/03/19 11:30 PM   Specimen: BLOOD RIGHT WRIST  Result Value Ref Range   Specimen Description BLOOD RIGHT WRIST    Special Requests      BOTTLES DRAWN AEROBIC AND ANAEROBIC Blood Culture adequate volume   Culture      NO GROWTH 1 DAY Performed at [facility], [address] [place], [place] [address]    Report Status PENDING   Urine culture     Status: Abnormal (Preliminary result)   Collection Time: 05/03/19 11:30 PM   Specimen: Urine, Random  Result Value Ref Range   Specimen Description URINE, RANDOM    Special Requests      NONE Performed at [facility], [address] [place], [place] [address]    Culture >=100,000 COLONIES/mL GRAM NEGATIVE RODS (A)    Report Status PENDING   CBC with Differential     Status: Abnormal   Collection Time: 05/03/19 11:31 PM  Result Value Ref Range   WBC 12.2 (H) 4.0 - 10.5 K/uL   RBC 3.04 (L) 3.87 - 5.11 MIL/uL   Hemoglobin 8.7 (L) 12.0 - 15.0 g/dL   HCT 09.828.4 (L) 11.936.0 - 14.746.0 %   MCV 93.4 80.0 - 100.0 fL   MCH 28.6 26.0 - 34.0 pg   MCHC 30.6 30.0 - 36.0 g/dL   RDW 82.914.1 56.211.5 - 13.015.5 %   Platelets 446 (H) 150 - 400 K/uL   nRBC 0.0 0.0 - 0.2 %   Neutrophils Relative % 75 %   Neutro Abs 9.3 (H) 1.7 - 7.7 K/uL   Lymphocytes Relative 13 %   Lymphs Abs 1.5 0.7 - 4.0 K/uL   Monocytes Relative 8 %   Monocytes Absolute 1.0 0.1 - 1.0 K/uL   Eosinophils Relative 2 %   Eosinophils Absolute 0.3 0.0 - 0.5 K/uL   Basophils Relative 1 %   Basophils Absolute 0.1 0.0 - 0.1 K/uL   Immature Granulocytes 1 %   Abs Immature Granulocytes 0.09 (H) 0.00 - 0.07 K/uL    Comment: Performed at [facility], [address] [place], [place]  [address]  Comprehensive metabolic panel     Status: Abnormal   Collection Time: 05/03/19 11:31 PM  Result Value Ref Range   Sodium 135 135 - 145 mmol/L   Potassium 4.4 3.5 - 5.1 mmol/L   Chloride 100 98 - 111 mmol/L   CO2 22 22 -  32 mmol/L   Glucose, Bld 141 (H) 70 - 99 mg/dL   BUN 36 (H) 8 - 23 mg/dL   Creatinine, Ser 2.12 (H) 0.44 - 1.00 mg/dL   Calcium 8.7 (L) 8.9 - 10.3 mg/dL   Total Protein 7.9 6.5 - 8.1 g/dL   Albumin 2.5 (L) 3.5 - 5.0 g/dL   AST 17 15 - 41 U/L   ALT 17 0 - 44 U/L   Alkaline Phosphatase 75 38 - 126 U/L   Total Bilirubin 0.3 0.3 - 1.2 mg/dL   GFR calc non Af Amer 23 (L) >60 mL/min   GFR calc Af Amer 27 (L) >60 mL/min   Anion gap 13 5 - 15    Comment: Performed at [facility] [address] [place], [place] [address]  Lactic acid, plasma     Status: None   Collection Time: 05/03/19 11:32 PM  Result Value Ref Range   Lactic Acid, Venous 1.1 0.5 - 1.9 mmol/L    Comment: Performed at [facility] [address] [place], [place] [address]  Urinalysis, Routine w reflex microscopic     Status: Abnormal   Collection Time: 05/03/19 11:33 PM  Result Value Ref Range   Color, Urine YELLOW YELLOW   APPearance TURBID (A) CLEAR   Specific Gravity, Urine 1.031 (H) 1.005 - 1.030   pH 5.0 5.0 - 8.0   Glucose, UA NEGATIVE NEGATIVE mg/dL   Hgb urine dipstick NEGATIVE NEGATIVE   Bilirubin Urine NEGATIVE NEGATIVE   Ketones, ur NEGATIVE NEGATIVE mg/dL   Protein, ur 100 (A) NEGATIVE mg/dL   Nitrite POSITIVE (A) NEGATIVE   Leukocytes,Ua TRACE (A) NEGATIVE   RBC / HPF 0-5 0 - 5 RBC/hpf   WBC, UA 11-20 0 - 5 WBC/hpf   Bacteria, UA MANY (A) NONE SEEN   Squamous Epithelial / LPF 11-20 0 - 5   Mucus PRESENT    Hyaline Casts, UA PRESENT     Comment: Performed at [facility], [place] [address], [place], [place] [address]  Blood culture (routine x 2)     Status: None (Preliminary result)   Collection Time: 05/03/19 11:40 PM   Specimen: BLOOD LEFT HAND  Result  Value Ref Range   Specimen Description BLOOD LEFT HAND    Special Requests      BOTTLES DRAWN AEROBIC ONLY Blood Culture adequate volume   Culture      NO GROWTH 1 DAY Performed at [facility], [place] [address], [place], [place] [address]    Report Status PENDING   SARS Coronavirus 2 [facility] order, Performed in [facility] lab) Nasopharyngeal Nasopharyngeal Swab     Status: None   Collection Time: 05/04/19  1:36 AM   Specimen: Nasopharyngeal Swab  Result Value Ref Range   SARS Coronavirus 2 NEGATIVE NEGATIVE    Comment: (NOTE) If result is NEGATIVE SARS-CoV-2 target nucleic acids are NOT DETECTED. The SARS-CoV-2 RNA is generally detectable in upper and lower  respiratory specimens during the acute phase of infection. The lowest  concentration of SARS-CoV-2 viral copies this assay can detect is 250  copies / mL. A negative result does not preclude SARS-CoV-2 infection  and should not be used as the sole basis for treatment or other  patient management decisions.  A negative result may occur with  improper specimen collection / handling, submission of specimen other  than nasopharyngeal swab, presence of viral mutation(s) within the  areas targeted by this  assay, and inadequate number of viral copies  (<250 copies / mL). A negative result must be combined with clinical  observations, patient history, and epidemiological information. If result is POSITIVE SARS-CoV-2 target nucleic acids are DETECTED. The SARS-CoV-2 RNA is generally detectable in upper and lower  respiratory specimens dur ing the acute phase of infection.  Positive  results are indicative of active infection with SARS-CoV-2.  Clinical  correlation with patient history and other diagnostic information is  necessary to determine patient infection status.  Positive results do  not rule out bacterial infection or co-infection with other viruses. If result is PRESUMPTIVE POSTIVE SARS-CoV-2 nucleic acids  MAY BE PRESENT.   A presumptive positive result was obtained on the submitted specimen  and confirmed on repeat testing.  While 2019 novel coronavirus  (SARS-CoV-2) nucleic acids may be present in the submitted sample  additional confirmatory testing may be necessary for epidemiological  and / or clinical management purposes  to differentiate between  SARS-CoV-2 and other Sarbecovirus currently known to infect humans.  If clinically indicated additional testing with an alternate test  methodology [phone]) is advised. The SARS-CoV-2 RNA is generally  detectable in upper and lower respiratory sp ecimens during the acute  phase of infection. The expected result is Negative. Fact Sheet for Patients:  BoilerBrush.com.cy Fact Sheet for Healthcare Providers: https://pope.com/ This test is not yet approved or cleared by the [place] FDA and has been authorized for detection and/or diagnosis of SARS-CoV-2 by FDA under an Emergency Use Authorization (EUA).  This EUA will remain in effect (meaning this test can be used) for the duration of the COVID-19 declaration under Section 564(b)(1) of the Act, 21 U.S.C. section 360bbb-3(b)(1), unless the authorization is terminated or revoked sooner. Performed at [facility], [address], [place], [place] [address]   Wound or Superficial Culture     Status: None (Preliminary result)   Collection Time: 05/04/19  1:48 AM   Specimen: Wound  Result Value Ref Range   Specimen Description WOUND RIGHT LOWER LEG    Special Requests NONE    Gram Stain      NO WBC SEEN ABUNDANT GRAM NEGATIVE RODS ABUNDANT GRAM POSITIVE COCCI MODERATE GRAM POSITIVE RODS    Culture      CULTURE REINCUBATED FOR BETTER GROWTH Performed at [facility], [address], [place], [place] [address]    Report Status PENDING   Basic metabolic panel     Status: Abnormal   Collection Time: 05/04/19  6:16 AM   Result Value Ref Range   Sodium 137 135 - 145 mmol/L   Potassium 4.1 3.5 - 5.1 mmol/L   Chloride 103 98 - 111 mmol/L   CO2 25 22 - 32 mmol/L   Glucose, Bld 128 (H) 70 - 99 mg/dL   BUN 32 (H) 8 - 23 mg/dL   Creatinine, Ser 6.21 (H) 0.44 - 1.00 mg/dL   Calcium 8.3 (L) 8.9 - 10.3 mg/dL   GFR calc non Af Amer 31 (L) >60 mL/min   GFR calc Af Amer 36 (L) >60 mL/min   Anion gap 9 5 - 15    Comment: Performed at [facility], [address], [place], [place] [address]  CBC     Status: Abnormal   Collection Time: 05/04/19  6:16 AM  Result Value Ref Range   WBC 11.6 (H) 4.0 - 10.5 K/uL   RBC 3.32 (L) 3.87 - 5.11 MIL/uL   Hemoglobin 9.3 (  L) 12.0 - 15.0 g/dL   HCT 74.1 (L) 28.7 - 86.7 %   MCV 94.0 80.0 - 100.0 fL   MCH 28.0 26.0 - 34.0 pg   MCHC 29.8 (L) 30.0 - 36.0 g/dL   RDW 67.2 09.4 - 70.9 %   Platelets 347 150 - 400 K/uL   nRBC 0.0 0.0 - 0.2 %    Comment: Performed at [facility], [address] [place], [place] [address]  Hemoglobin A1c     Status: Abnormal   Collection Time: 05/04/19  6:16 AM  Result Value Ref Range   Hgb A1c MFr Bld 6.6 (H) 4.8 - 5.6 %    Comment: (NOTE) Pre diabetes:          5.7%-6.4% Diabetes:              >6.4% Glycemic control for   <7.0% adults with diabetes    Mean Plasma Glucose 142.72 mg/dL    Comment: Performed at [facility], [address], [place], [place] [address]  Glucose, capillary     Status: Abnormal   Collection Time: 05/04/19 12:35 PM  Result Value Ref Range   Glucose-Capillary 106 (H) 70 - 99 mg/dL   Comment 1 Notify RN    Comment 2 Document in Chart   C-reactive protein     Status: Abnormal   Collection Time: 05/05/19  3:21 AM  Result Value Ref Range   CRP 16.2 (H) <1.0 mg/dL    Comment: Performed at [facility], [address] [place], [place] [address]  CBC     Status: Abnormal   Collection Time: 05/05/19  3:21 AM  Result Value Ref Range   WBC 7.2 4.0 - 10.5 K/uL   RBC 2.96 (L) 3.87 - 5.11  MIL/uL   Hemoglobin 8.4 (L) 12.0 - 15.0 g/dL   HCT 03.5 (L) 46.5 - 68.1 %   MCV 93.9 80.0 - 100.0 fL   MCH 28.4 26.0 - 34.0 pg   MCHC 30.2 30.0 - 36.0 g/dL   RDW 27.5 17.0 - 01.7 %   Platelets 304 150 - 400 K/uL   nRBC 0.0 0.0 - 0.2 %    Comment: Performed at [facility], [address], [place], [place] [address]  Basic metabolic panel     Status: Abnormal   Collection Time: 05/05/19  3:21 AM  Result Value Ref Range   Sodium 137 135 - 145 mmol/L   Potassium 3.9 3.5 - 5.1 mmol/L   Chloride 108 98 - 111 mmol/L   CO2 20 (L) 22 - 32 mmol/L   Glucose, Bld 93 70 - 99 mg/dL   BUN 23 8 - 23 mg/dL   Creatinine, Ser 6.75 (H) 0.44 - 1.00 mg/dL   Calcium 8.1 (L) 8.9 - 10.3 mg/dL   GFR calc non Af Amer 45 (L) >60 mL/min   GFR calc Af Amer 53 (L) >60 mL/min   Anion gap 9 5 - 15    Comment: Performed at [facility], [address] [place], [place] [address]  Ferritin     Status: None   Collection Time: 05/05/19  3:21 AM  Result Value Ref Range   Ferritin 233 11 - 307 ng/mL    Comment: Performed at [facility], [address], [place], [place] [address]  Iron and TIBC     Status: Abnormal   Collection Time: 05/05/19  3:21 AM  Result Value Ref Range  Iron 18 (L) 28 - 170 ug/dL   TIBC 161147 (L) 096250 - 045450 ug/dL   Saturation Ratios 12 10.4 - 31.8 %   UIBC 129 ug/dL    Comment: Performed at [facility], [address] [place], [place] [address]  Reticulocytes     Status: Abnormal   Collection Time: 05/05/19  3:21 AM  Result Value Ref Range   Retic Ct Pct 1.5 0.4 - 3.1 %   RBC. 2.96 (L) 3.87 - 5.11 MIL/uL   Retic Count, Absolute 42.9 19.0 - 186.0 K/uL   Immature Retic Fract 12.2 2.3 - 15.9 %    Comment: Performed at [facility], [address] [place], [place] [address]    Medications:  Current Facility-Administered Medications  Medication Dose Route Frequency Provider Last Rate Last Dose  . ceFEPIme (MAXIPIME) 1 g in sodium chloride 0.9 % 100 mL  IVPB  1 g Intravenous Q24H Katha CabalBrimage, Vondra, MD 200 mL/hr at 05/05/19 0141 1 g at 05/05/19 0141  . [START ON 05/06/2019] ferrous sulfate tablet 325 mg  325 mg Oral Q breakfast Mullis, Kiersten P, DO      . gabapentin (NEURONTIN) capsule 300 mg  300 mg Oral TID Lennox SoldersWinfrey, Amanda C, MD   300 mg at 05/05/19 0843  . heparin injection 5,000 Units  5,000 Units Subcutaneous Q8H Katha CabalBrimage, Vondra, MD   5,000 Units at 05/05/19 0523  . polyethylene glycol (MIRALAX / GLYCOLAX) packet 17 g  17 g Oral Daily PRN Katha CabalBrimage, Vondra, MD      . sodium hypochlorite (DAKIN'S 1/4 STRENGTH) topical solution   Irrigation Q1200 Hensel, William A, MD      . traMADol (ULTRAM) tablet 50 mg  50 mg Oral Q6H PRN Lennox SoldersWinfrey, Amanda C, MD   50 mg at 05/05/19 0843  . traZODone (DESYREL) tablet 50 mg  50 mg Oral QHS Katha CabalBrimage, Vondra, MD   50 mg at 05/04/19 2037  . vancomycin (VANCOCIN) 1,500 mg in sodium chloride 0.9 % 500 mL IVPB  1,500 mg Intravenous Once Katha CabalBrimage, Vondra, MD   Stopped at 05/04/19 0403  . vancomycin (VANCOCIN) 1,500 mg in sodium chloride 0.9 % 500 mL IVPB  1,500 mg Intravenous Q36H Pham, Mimi L, RPH        Musculoskeletal: Strength & Muscle Tone: Atrophy associated with aging.  Gait & Station: UTA since patient is lying in bed. Patient leans: N/A  Psychiatric Specialty Exam: Physical Exam  Nursing note and vitals reviewed. Constitutional: She is oriented to person, place, and time. She appears well-developed and well-nourished.  HENT:  Head: Normocephalic and atraumatic.  Neck: Normal range of motion.  Respiratory: Effort normal.  Musculoskeletal: Normal range of motion.  Neurological: She is alert and oriented to person, place, and time.  Psychiatric: She has a normal mood and affect. Her speech is normal and behavior is normal. Judgment and thought content normal. Cognition and memory are normal.    Review of Systems  Gastrointestinal: Negative for constipation, diarrhea, nausea and vomiting.  Musculoskeletal:        Right leg pain  Psychiatric/Behavioral: Negative for depression, hallucinations, substance abuse and suicidal ideas. The patient does not have insomnia.   All other systems reviewed and are negative.   Blood pressure (!) 104/50, pulse 100, temperature 99.1 F (37.3 C), temperature source Oral, resp. rate 17, height 5' 6.5" (1.689 m), weight 77.2 kg, SpO2 94 %.Body mass index is 27.06 kg/m.  General Appearance: Fairly Groomed, elderly, Caucasian female, wearing a  hospital gown with gray hair who is lying in bed. NAD.   Eye Contact:  Good  Speech:  Clear and Coherent and Normal Rate  Volume:  Normal  Mood:  Euthymic  Affect:  Congruent and Full Range  Thought Process:  Goal Directed, Linear and Descriptions of Associations: Intact  Orientation:  Full (Time, Place, and Person)  Thought Content:  Logical  Suicidal Thoughts:  No  Homicidal Thoughts:  No  Memory:  Immediate;   Good Recent;   Good Remote;   Good  Judgement:  Fair  Insight:  Fair  Psychomotor Activity:  Normal  Concentration:  Concentration: Good and Attention Span: Good  Recall:  Good  Fund of Knowledge:  Good  Language:  Good  Akathisia:  No  Handed:  Right  AIMS (if indicated):   N/A  Assets:  Communication Skills Desire for Improvement Financial Resources/Insurance Housing Resilience Social Support  ADL's:  Impaired  Cognition:  WNL  Sleep:   Okay   Assessment:  MADALINE LEFEBER is a 68 y.o. female who was admitted with chronic venous stasis ulcers and fever secondary to noncompliance with treatment. Patient reports intermittent depressed mood when she is alone but reports having a good support system. She denies SI, HI or AVH. She does not appear to be responding to internal stimuli. She is pleasant throughout interview. It appears that patient has splitting behaviors based on primary team report which are more indicative of a personality component such as borderline personality disorder. Agree that strict  boundaries should be maintained with the patient and reducing the opportunity to split staff will be most helpful with providing care to this patient.   Treatment Plan Summary: -Continue Trazodone 50 mg qhs for insomnia.  -EKG reviewed and QTc 440 on 9/7. Please closely monitor when starting or increasing QTc prolonging agents.  -Psychiatry will sign off on patient at this time. Please consult psychiatry again as needed.    Disposition: No evidence of imminent risk to self or others at present.   Patient does not meet criteria for psychiatric inpatient admission.  This service was provided via telemedicine using a 2-way, interactive audio and video technology.  Names of all persons participating in this telemedicine service and their role in this encounter. Name: Juanetta Beets, DO Role: Psychiatrist   Name: Charlette Caffey Role: Patient    Cherly Beach, DO 05/05/2019 12:18 PM

## 2019-05-06 DIAGNOSIS — N179 Acute kidney failure, unspecified: Secondary | ICD-10-CM | POA: Diagnosis not present

## 2019-05-06 DIAGNOSIS — G894 Chronic pain syndrome: Secondary | ICD-10-CM | POA: Diagnosis not present

## 2019-05-06 DIAGNOSIS — F4325 Adjustment disorder with mixed disturbance of emotions and conduct: Secondary | ICD-10-CM | POA: Diagnosis not present

## 2019-05-06 DIAGNOSIS — L98499 Non-pressure chronic ulcer of skin of other sites with unspecified severity: Secondary | ICD-10-CM | POA: Diagnosis not present

## 2019-05-06 DIAGNOSIS — L03116 Cellulitis of left lower limb: Secondary | ICD-10-CM | POA: Diagnosis not present

## 2019-05-06 DIAGNOSIS — E1165 Type 2 diabetes mellitus with hyperglycemia: Secondary | ICD-10-CM

## 2019-05-06 DIAGNOSIS — N3000 Acute cystitis without hematuria: Secondary | ICD-10-CM | POA: Diagnosis not present

## 2019-05-06 LAB — CBC WITH DIFFERENTIAL/PLATELET
Abs Immature Granulocytes: 0.05 10*3/uL (ref 0.00–0.07)
Basophils Absolute: 0 10*3/uL (ref 0.0–0.1)
Basophils Relative: 1 %
Eosinophils Absolute: 0.2 10*3/uL (ref 0.0–0.5)
Eosinophils Relative: 5 %
HCT: 26.1 % — ABNORMAL LOW (ref 36.0–46.0)
Hemoglobin: 8.1 g/dL — ABNORMAL LOW (ref 12.0–15.0)
Immature Granulocytes: 1 %
Lymphocytes Relative: 32 %
Lymphs Abs: 1.6 10*3/uL (ref 0.7–4.0)
MCH: 28.8 pg (ref 26.0–34.0)
MCHC: 31 g/dL (ref 30.0–36.0)
MCV: 92.9 fL (ref 80.0–100.0)
Monocytes Absolute: 0.7 10*3/uL (ref 0.1–1.0)
Monocytes Relative: 13 %
Neutro Abs: 2.5 10*3/uL (ref 1.7–7.7)
Neutrophils Relative %: 48 %
Platelets: 276 10*3/uL (ref 150–400)
RBC: 2.81 MIL/uL — ABNORMAL LOW (ref 3.87–5.11)
RDW: 14.1 % (ref 11.5–15.5)
WBC: 5.1 10*3/uL (ref 4.0–10.5)
nRBC: 0 % (ref 0.0–0.2)

## 2019-05-06 LAB — BASIC METABOLIC PANEL
Anion gap: 6 (ref 5–15)
BUN: 26 mg/dL — ABNORMAL HIGH (ref 8–23)
CO2: 23 mmol/L (ref 22–32)
Calcium: 8.1 mg/dL — ABNORMAL LOW (ref 8.9–10.3)
Chloride: 106 mmol/L (ref 98–111)
Creatinine, Ser: 1.29 mg/dL — ABNORMAL HIGH (ref 0.44–1.00)
GFR calc Af Amer: 49 mL/min — ABNORMAL LOW (ref >60)
GFR calc non Af Amer: 43 mL/min — ABNORMAL LOW (ref >60)
Glucose, Bld: 129 mg/dL — ABNORMAL HIGH (ref 70–99)
Potassium: 4.2 mmol/L (ref 3.5–5.1)
Sodium: 135 mmol/L (ref 135–145)

## 2019-05-06 LAB — URINE CULTURE: Culture: >=100000 — AB

## 2019-05-06 MED ORDER — CEPHALEXIN 500 MG PO CAPS
500.0000 mg | ORAL_CAPSULE | Freq: Four times a day (QID) | ORAL | Status: AC
  Administered 2019-05-06 – 2019-05-13 (×29): 500 mg via ORAL
  Filled 2019-05-06 (×30): qty 1

## 2019-05-06 MED ORDER — DULOXETINE HCL 30 MG PO CPEP
30.0000 mg | ORAL_CAPSULE | Freq: Every day | ORAL | Status: DC
  Administered 2019-05-07 – 2019-05-22 (×16): 30 mg via ORAL
  Filled 2019-05-06 (×17): qty 1

## 2019-05-06 NOTE — Progress Notes (Signed)
Patient is refusing to be monitored on telemetry.  Patient educated on the importance of monitoring via telemetry.

## 2019-05-06 NOTE — Progress Notes (Signed)
Patient is refusing bilateral dressing change.  Patient is irritable and insists the dressing change be done tomorrow morning.  Patient educated on the importance of daily dressing changes.

## 2019-05-06 NOTE — Progress Notes (Signed)
Family Medicine Teaching Service Daily Progress Note Intern Pager: [phone]  Patient name: Caroline HesselbachSusan M Phillips Medical record number: [id number] Date of birth: 04/18/1951 Age: 68 y.o. Gender: female  Primary Care Provider: Unknown Phillips, [name], DO Consultants: none Code Status: Full  Pt Overview and Major Events to Date:  9/7-admitted, IV cefepime and vanc started 9/8-UTI E. Coli 9/9- IV abx d/c, PO keflex started  Assessment and Plan: [name] is a 68 y.o. female presenting with lower extremity pain secondary to chronic venous stasis ulcers. PMH is significant for atrial fibrillation, anemia and bipolar disorder.   Chronic Venous Stasis Ulcers  Fever Admitted for treatment of bilateral chronic venous stasis that have failed to improve 2/2 to noncompliance.  Patient self canceled MRI.  IV abx discontinued. Start keflex for total 10 days of abx treatment.  Afebrile overnight with heart rate appropriately 70 to 90s.  Blood pressure on the softer side most recent 105/52.  Decreased erythema on bilateral lower extremities, wounds still have foul odor but no purulent discharge.  No leukocytosis. Wound culture with abundant gram negative rods, gram positive cocci, and gram positive rods. Blood culture no growth x2 days.  Patient willing to go to SNF.   - Disontinue IV Vanc and Cefepime, NG x2 d on blood culture - Start keflex 500 mg q6h end date 9/16 - follow up case management and wound care recs: [facility] -PMR signed off - elevate LE's  - Tramadol 50mg  q6 prn for pain   UTI Urine culture reveals greater than 100,000 colony-forming units of E. coli. Awaiting sensitivities.  Plan to switch to p.o. antibiotics when sensitivities available as she remains afebrile. - d/c cefepime IV  - start keflex 500 mg q6h po end date 9/16  Bipolar disorder versus personality disorder Concern for patient to have bipolar disorder with both manic episodes and depression where she  cannot take care of herself.  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  They do not recommend any medications as she declines them at this time.  They suggest not splitting staff and keeping clear boundaries. -Psychiatry signed off at this time.  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  - PMR agrees with PT/OT to SNF, patient willing.  Awaiting placement. - Tramadol 50mg  q6 PRN - Gabapentin 300mg  TID  - Start Duloxetine 30 mg PO qd for neuropathic pain  AKI: Cr 2.12 on admission, improved to 1.29 this AM (baseline ~1.0). Expect hypoperfusion contributing given improvement with fluids. Will continue to monitor. - avoid nephrotoxic agents - AM BMP - discontinue mIVF  Anemia: Hemoglobin 8.1 this AM (Baseline  10-11). No signs of bleeding on exam. Iron studies revealed low iron to 18, elevated TIBC to 129, ferritin high at 233 likely due to infection.  Likely iron deficiency anemia. -We will start ferrous sulfate p.o. at discharge - daily CBC  FEN/GI: Heart healthy diet, MiraLAX PPx: Heparin  Disposition: To SNF vs ALF when placement available  Subjective:  Patient feels much better this morning, some increase in leg pain.  She has decreased swelling and erythema, feet are well perfused.  Objective: Temp:  [99.2 F (37.3 C)-99.5 F (37.5 C)] 99.2 F (37.3 C) (09/09 0321) Pulse Rate:  [78-80] 78 (09/09 0321) Resp:  [18] 18 (09/09 0321) BP: (105-110)/(52-72) 105/52 (09/09 0321) SpO2:  [97 %-99 %]  97 % (09/09 0321) Physical Exam: General: Pleasant, resting comfortably in bed, no acute distress Cardiovascular: Regular rate and rhythm, no murmur/rub/gallop Respiratory: Clear to auscultation bilaterally with normal work of breathing Abdomen: Soft, nontender, nondistended, normoactive bowel sounds Extremities: Cutaneous wound to  lateral right lower extremity, with decreasing erythema, very tender to palpation, malodorous drainage, left lower extremity with thickened scaly tissue with creasing erythema, lower extremities both appropriately and equally warm, normal tone, 2+ pedal pulses bilaterally Neuro: AOx3, pleasant today, no splitting  Laboratory: Recent Labs  Lab 05/04/19 0616 05/05/19 0321 05/06/19 0317  WBC 11.6* 7.2 5.1  HGB 9.3* 8.4* 8.1*  HCT 31.2* 27.8* 26.1*  PLT 347 304 276   Recent Labs  Lab 05/03/19 2331 05/04/19 0616 05/05/19 0321 05/06/19 0317  NA 135 137 137 135  K 4.4 4.1 3.9 4.2  CL 100 103 108 106  CO2 22 25 20* 23  BUN 36* 32* 23 26*  CREATININE 2.12* 1.68* 1.22* 1.29*  CALCIUM 8.7* 8.3* 8.1* 8.1*  PROT 7.9  --   --   --   BILITOT 0.3  --   --   --   ALKPHOS 75  --   --   --   ALT 17  --   --   --   AST 17  --   --   --   GLUCOSE 141* 128* 93 129*    Imaging/Diagnostic Tests: No results found.  Gladys Damme, MD 05/06/2019, 8:40 AM PGY-1, [facility] Intern pager: [phone], text pages welcome

## 2019-05-06 NOTE — Progress Notes (Signed)
Patient refused morning vitals by nurse tech.  Patient educated on importance of monitoring vitals.

## 2019-05-07 DIAGNOSIS — G14 Postpolio syndrome: Secondary | ICD-10-CM | POA: Diagnosis not present

## 2019-05-07 DIAGNOSIS — L97219 Non-pressure chronic ulcer of right calf with unspecified severity: Secondary | ICD-10-CM | POA: Diagnosis not present

## 2019-05-07 DIAGNOSIS — N390 Urinary tract infection, site not specified: Secondary | ICD-10-CM | POA: Diagnosis not present

## 2019-05-07 DIAGNOSIS — N3000 Acute cystitis without hematuria: Secondary | ICD-10-CM | POA: Diagnosis not present

## 2019-05-07 DIAGNOSIS — L03116 Cellulitis of left lower limb: Secondary | ICD-10-CM | POA: Diagnosis not present

## 2019-05-07 DIAGNOSIS — L98491 Non-pressure chronic ulcer of skin of other sites limited to breakdown of skin: Secondary | ICD-10-CM

## 2019-05-07 DIAGNOSIS — I83012 Varicose veins of right lower extremity with ulcer of calf: Secondary | ICD-10-CM | POA: Diagnosis not present

## 2019-05-07 DIAGNOSIS — N179 Acute kidney failure, unspecified: Secondary | ICD-10-CM | POA: Diagnosis not present

## 2019-05-07 LAB — BASIC METABOLIC PANEL
Anion gap: 8 (ref 5–15)
BUN: 24 mg/dL — ABNORMAL HIGH (ref 8–23)
CO2: 23 mmol/L (ref 22–32)
Calcium: 8.5 mg/dL — ABNORMAL LOW (ref 8.9–10.3)
Chloride: 104 mmol/L (ref 98–111)
Creatinine, Ser: 1.14 mg/dL — ABNORMAL HIGH (ref 0.44–1.00)
GFR calc Af Amer: 57 mL/min — ABNORMAL LOW (ref >60)
GFR calc non Af Amer: 49 mL/min — ABNORMAL LOW (ref >60)
Glucose, Bld: 104 mg/dL — ABNORMAL HIGH (ref 70–99)
Potassium: 4.1 mmol/L (ref 3.5–5.1)
Sodium: 135 mmol/L (ref 135–145)

## 2019-05-07 LAB — CBC
HCT: 29 % — ABNORMAL LOW (ref 36.0–46.0)
Hemoglobin: 8.8 g/dL — ABNORMAL LOW (ref 12.0–15.0)
MCH: 27.9 pg (ref 26.0–34.0)
MCHC: 30.3 g/dL (ref 30.0–36.0)
MCV: 92.1 fL (ref 80.0–100.0)
Platelets: 292 10*3/uL (ref 150–400)
RBC: 3.15 MIL/uL — ABNORMAL LOW (ref 3.87–5.11)
RDW: 14 % (ref 11.5–15.5)
WBC: 5.9 10*3/uL (ref 4.0–10.5)
nRBC: 0 % (ref 0.0–0.2)

## 2019-05-07 MED ORDER — GABAPENTIN 400 MG PO CAPS
400.0000 mg | ORAL_CAPSULE | Freq: Three times a day (TID) | ORAL | Status: DC
  Administered 2019-05-07 – 2019-05-22 (×45): 400 mg via ORAL
  Filled 2019-05-07 (×45): qty 1

## 2019-05-07 NOTE — Progress Notes (Signed)
Family Medicine Teaching Service Daily Progress Note Intern Pager: [phone]  Patient name: [name] Medical record number: [id number] Date of birth: 03/30/1951 Age: 68 y.o. Gender: female  Primary Care Provider: Cleophas Dunker, DO Consultants: none Code Status: Full  Pt Overview and Major Events to Date:  9/7-admitted, IV cefepime and vanc started 9/8-UTI E. Coli 9/9- IV abx d/c, PO keflex started  Assessment and Plan: [name] is a 68 y.o. female presenting with lower extremity pain secondary to chronic venous stasis ulcers. PMH is significant for atrial fibrillation, anemia and bipolar disorder.   Chronic Venous Stasis Ulcers  Fever Admitted for treatment of bilateral chronic venous stasis that have failed to improve 2/2 to noncompliance. She is medically stable and ready for disposition to SNF/ALF or home. Keflex started 9/9 for total 10 days of abx treatment.  Afebrile overnight with heart rate appropriately 70s.  Decreased erythema on bilateral lower extremities, wounds still have foul odor but no purulent discharge.  No leukocytosis. Wound culture with abundant gram negative rods, gram positive cocci, and gram positive rods. Blood culture no growth x2 days.  Patient willing to go to SNF.   - s/p IV Vanc and Cefepime (9/7-9/9), NG x2 d on blood culture - continue keflex 500 mg q6h end date 9/16 - follow up case management and wound care recs: awaiting bed -PMR signed off - elevate LE's  - Tramadol 50mg  q6 prn for pain   UTI Urine culture reveals greater than 100,000 colony-forming units of E. coli. Awaiting sensitivities.  Plan to switch to p.o. antibiotics when sensitivities available as she remains afebrile. 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  They do not recommend any medications as she declines them at this time.  They suggest not splitting staff and keeping clear boundaries. -Psychiatry signed off at this time.  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  - PMR agrees with PT/OT to SNF, patient willing.  Awaiting placement. - Tramadol 50mg  q6 PRN - increase Gabapentin 400mg  TID  - continue Duloxetine 30 mg PO qd for neuropathic pain  Paroxysmal A fib Patient has a well-known history of paroxysmal A. fib, which has been seen during this hospital admission.  She is stable and has no chest pain, no dizziness, no other symptoms.  She declines anticoagulation by this team and from her PCP.  AKI: resolved Cr 2.12 on admission, improved to 1.14 this AM (baseline ~1.0). Expect hypoperfusion contributing given improvement with fluids. Will continue to monitor. - avoid nephrotoxic agents - discontinue mIVF  Anemia: stable Hemoglobin 8.8 this AM (Baseline  10-11). No signs of bleeding on exam. Iron studies revealed low iron to 18, elevated TIBC to 129, ferritin high at 233 likely due to infection.  Likely iron deficiency anemia. - continue ferrous sulfate   FEN/GI: Heart healthy diet, MiraLAX PPx: Heparin  Disposition: To SNF vs ALF when placement available  Subjective:  Patient feels much better this morning, some increase in leg pain.  She has decreased swelling and erythema, feet are well perfused. Ready for discharge to SNF/ALF.  Objective: Temp:  [98.9 F (37.2  C)-99.3 F (37.4 C)] 99.3 F (37.4 C) (09/10 0504) Pulse Rate:  [76-79] 78 (09/10 0504) Resp:  [13-19] 19 (09/10 0504) BP: (97-133)/(58-69) 111/58 (09/10 0504) SpO2:  [95 %-99 %] 97 % (09/10 0504) Physical Exam: General: Pleasant, resting comfortably in bed, no acute  distress Cardiovascular: Regular rate and rhythm, no murmur/rub/gallop Respiratory: Clear to auscultation bilaterally with normal work of breathing Abdomen: Soft, nontender, nondistended, normoactive bowel sounds Extremities: Cutaneous wound to lateral right lower extremity, with decreasing erythema, very tender to palpation, malodorous drainage, left lower extremity with thickened scaly tissue with creasing erythema, lower extremities both appropriately and equally warm, normal tone, 2+ pedal pulses bilaterally Neuro: AOx3, pleasant today, no splitting  Laboratory: Recent Labs  Lab 05/05/19 0321 05/06/19 0317 05/07/19 0258  WBC 7.2 5.1 5.9  HGB 8.4* 8.1* 8.8*  HCT 27.8* 26.1* 29.0*  PLT 304 276 292   Recent Labs  Lab 05/03/19 2331  05/05/19 0321 05/06/19 0317 05/07/19 0258  NA 135   < > 137 135 135  K 4.4   < > 3.9 4.2 4.1  CL 100   < > 108 106 104  CO2 22   < > 20* 23 23  BUN 36*   < > 23 26* 24*  CREATININE 2.12*   < > 1.22* 1.29* 1.14*  CALCIUM 8.7*   < > 8.1* 8.1* 8.5*  PROT 7.9  --   --   --   --   BILITOT 0.3  --   --   --   --   ALKPHOS 75  --   --   --   --   ALT 17  --   --   --   --   AST 17  --   --   --   --   GLUCOSE 141*   < > 93 129* 104*   < > = values in this interval not displayed.    Imaging/Diagnostic Tests: No results found.  Shirlean MylarMahoney, Guthrie Lemme, MD 05/07/2019, 8:31 AM PGY-1, [facility] Family Medicine FPTS Intern pager: [phone], text pages welcome

## 2019-05-07 NOTE — TOC Progression Note (Signed)
Transition of Care [facility]) - Progression Note    Patient Details  Name: [name] MRN: [id number] Date of Birth: Jan 23, 1951  Transition of Care [facility]) CM/SW [place], [place] Phone Number: 05/07/2019, 5:57 PM  Clinical Narrative:     Patient's PASRR remains pending.  CSW visit with the patient at bedside. She was alert and oriented. Patient was appropriate and engaged in conversation. CSW explained SNF selected had no availability. Patient expressed she is still willing to go to SNF. She gave CSW permission to send referral to other SNFs in the area.   Possible barrier- insurance coverage for SNF.   CSW will continue to follow and assist with discharge planing.   Thurmond Butts, MSW, [facility] Clinical Social Worker [phone]   Expected Discharge Plan: Skilled Nursing Facility Barriers to Discharge: Continued Medical Work up  Expected Discharge Plan and Services Expected Discharge Plan: [place] Choice: [place] arrangements for the past 2 months: Single Family Home                                       Social Determinants of Health (SDOH) Interventions    Readmission Risk Interventions No flowsheet data found.

## 2019-05-07 NOTE — Progress Notes (Signed)
PT Cancellation Note  Patient Details Name: [name] MRN: [id number] DOB: 09/28/1950   Cancelled Treatment:    Reason Eval/Treat Not Completed: Other (comment). Pt refused stating her legs hurt and she was tired.   Shary Decamp [facility] 05/07/2019, 4:55 PM [place] Pager [phone] Office [phone]

## 2019-05-08 DIAGNOSIS — I83012 Varicose veins of right lower extremity with ulcer of calf: Secondary | ICD-10-CM | POA: Diagnosis not present

## 2019-05-08 DIAGNOSIS — L03116 Cellulitis of left lower limb: Secondary | ICD-10-CM | POA: Diagnosis not present

## 2019-05-08 DIAGNOSIS — L98499 Non-pressure chronic ulcer of skin of other sites with unspecified severity: Secondary | ICD-10-CM | POA: Diagnosis not present

## 2019-05-08 DIAGNOSIS — G894 Chronic pain syndrome: Secondary | ICD-10-CM | POA: Diagnosis not present

## 2019-05-08 DIAGNOSIS — N3000 Acute cystitis without hematuria: Secondary | ICD-10-CM | POA: Diagnosis not present

## 2019-05-08 LAB — AEROBIC CULTURE W GRAM STAIN (SUPERFICIAL SPECIMEN): Gram Stain: NONE SEEN

## 2019-05-08 NOTE — Progress Notes (Signed)
Occupational Therapy Treatment Patient Details Name: [name] MRN: [id number] DOB: 02-17-1951 Today's Date: 05/08/2019    History of present illness Patient is a 68 y/o female who presents with bilateral chronic venous stasis that have failed to improve 2/2 to noncompliance. PHMx:Bipolar I disorder with mania   OT comments  This 68 yo female admitted with above presents to acute OT with moving very slowly to get up to EOB (but more quickly to lay back down), not wanting help- but then saying well you could help me, increased pain (burning) in Bil LEs the longer she left them down at EOB. Not making any progress today due to pain and slowness of movement as well as hesitantly open to only parital suggestions of doing things to make it easier to move. She will continue to benefit from acute OT with follow up OT at SNF.  Follow Up Recommendations  SNF;Supervision/Assistance - 24 hour    Equipment Recommendations  3 in 1 bedside commode;Tub/shower bench;None recommended by OT(power WC)       Precautions / Restrictions Precautions Precautions: Fall Precaution Comments: wounds distal BLEs Restrictions Weight Bearing Restrictions: No       Mobility Bed Mobility Overal bed mobility: Needs Assistance Bed Mobility: Sit to Supine     Supine to sit: Mod assist;HOB elevated Sit to supine: Min assist   General bed mobility comments: Supine>sit: Pt asked to for therapists arm to pull up on to go from supine to sit to get trunk off of bed as well as asked to help her shift shoulders around with use of pillow. Sit>supine: min A to position legs and hips     Balance Overall balance assessment: Needs assistance Sitting-balance support: No upper extremity supported;Feet unsupported Sitting balance-Leahy Scale: Fair                                     ADL either performed or assessed with clinical judgement   ADL Overall ADL's : Needs assistance/impaired Eating/Feeding:  Bed level;Independent                                     General ADL Comments: Pt limited by pain, as well as impaired cognition. She is able to do grooming and UBB tasks based off the way she is currently moving, but is distracted by pain, potential pain, and wanting to do everything her way and in her own time.     Vision Patient Visual Report: No change from baseline            Cognition Arousal/Alertness: Awake/alert Behavior During Therapy: Agitated;Anxious Overall Cognitive Status: No family/caregiver present to determine baseline cognitive functioning Area of Impairment: Following commands;Safety/judgement;Problem solving                       Following Commands: Follows one step commands with increased time Safety/Judgement: Decreased awareness of safety   Problem Solving: Slow processing;Difficulty sequencing;Requires verbal cues;Requires tactile cues General Comments: Agitated when we would try to help her or suggest we help her or tried to encouarge her on--"If you keep rushing me, I'll just sit here longer" (35 minutes to get to EOB from [facility] up). "Don't help me, I am independent and can do it myself"--then not a minute later, "well you could help me" but then when asked  her if we could do this or that to help her she would say no.                   Pertinent Vitals/ Pain       Pain Assessment: Faces Faces Pain Scale: Hurts whole lot Pain Location: distal BLEs Pain Descriptors / Indicators: Burning;Sore Pain Intervention(s): Limited activity within patient's tolerance;Repositioned;Patient requesting pain meds-RN notified(Director of unit in room and checked, pt current pain meds aren't due at this time, but MD will be made aware that current pain meds do not seem to be working and pt encouaged by Director to talk to her MD when they come to room)     Prior Functioning/Environment              Frequency  Min 2X/week         Progress Toward Goals  OT Goals(current goals can now be found in the care plan section)  Progress towards OT goals: Not progressing toward goals - comment(due to pain and slowness of movements)     Plan Discharge plan remains appropriate    Co-evaluation    PT/OT/SLP Co-Evaluation/Treatment: Yes Reason for Co-Treatment: Complexity of the patient's impairments (multi-system involvement);Necessary to address cognition/behavior during functional activity PT goals addressed during session: Mobility/safety with mobility;Balance;Strengthening/ROM OT goals addressed during session: Strengthening/ROM      AM-PAC OT "6 Clicks" Daily Activity     Outcome Measure   Help from another person eating meals?: None Help from another person taking care of personal grooming?: A Little Help from another person toileting, which includes using toliet, bedpan, or urinal?: Total Help from another person bathing (including washing, rinsing, drying)?: A Lot Help from another person to put on and taking off regular upper body clothing?: A Lot Help from another person to put on and taking off regular lower body clothing?: Total 6 Click Score: 13    End of Session    OT Visit Diagnosis: Other abnormalities of gait and mobility (R26.89);Pain Pain - Right/Left: (both) Pain - part of body: Leg   Activity Tolerance Patient limited by pain   Patient Left in bed;with call bell/phone within reach;with bed alarm set(RN and MD in room with her)   Nurse Communication (pt sat up on EOB up with us and then layed back down)        Time: [id number] OT Time Calculation (min): 43 min  Charges: OT General Charges $OT Visit: 1 Visit OT Treatments $Therapeutic Activity: 23-37 mins  Ignacia Palmaathy Verbie Babic, OTR/L [facility] Pager [phone] Office [phone]      Evette GeorgesLeonard, Matteus Mcnelly Eva 05/08/2019, 9:11 AM

## 2019-05-08 NOTE — Progress Notes (Signed)
Pt refusing dressing changes tonight, refused for day shift RN as well. States her pain is "changing" and she wants to keep an eye on it. The legs are visually consistent w/ previous assessments. I told her I will re-attempt in the AM. She states she knows it has to be done and is open to considering getting it done tomorrow.

## 2019-05-08 NOTE — TOC Progression Note (Addendum)
Transition of Care [facility]) - Progression Note    Patient Details  Name: [name] MRN: [id number] Date of Birth: 04/20/1951  Transition of Care [facility]) CM/SW [place], [place] Phone Number: 05/08/2019, 3:51 PM  Clinical Narrative:     CSW visit with the patient at bedside. CSW provided updated of No bed offers. Patient expressed she worked with PT today and she was too weak to stand. CSW explained the SNF process and the possibility of her giving up her monthly income for SNF placement.  Patient will have to give up her income IF offered SNF placement and patient states she understands. Patient states she can not afford and "will not" give up her monthly income for ST rehab at [facility]. CSW expressed concern that she lives alone and states no one to assist her at home.  Patient gave CSW permission to call her friend, [name]. He states he is unable to assist patient in the home.  CSW advised the patient he was unable to provide the assistant and supervision needed. CSW encourage the patient to explore her options. Patient is adamant about not giving up her monthly income.   CSW updated MD, RN, RNCM.   PSARR remains pending- 30 day  Note on chart for MD to sign.   Thurmond Butts, MSW, [facility] Clinical Social Worker [phone]   Expected Discharge Plan: Skilled Nursing Facility Barriers to Discharge: Continued Medical Work up  Expected Discharge Plan and Services Expected Discharge Plan: [place] Choice: [place] arrangements for the past 2 months: Single Family Home                                       Social Determinants of Health (SDOH) Interventions    Readmission Risk Interventions No flowsheet data found.

## 2019-05-08 NOTE — Progress Notes (Signed)
Family Medicine Teaching Service Daily Progress Note Intern Pager: [phone]  Patient name: [name] Medical record number: [id number] Date of birth: August 31, 1950 Age: 68 y.o. Gender: female  Primary Care Provider: Cleophas Dunker, DO Consultants: none Code Status: Full  Pt Overview and Major Events to Date:  9/7-admitted, IV cefepime and vanc started 9/8-UTI E. Coli 9/9- IV abx d/c, PO keflex started  Assessment and Plan: [name] is a 68 y.o. female presenting with lower extremity pain secondary to chronic venous stasis ulcers. PMH is significant for atrial fibrillation, anemia and bipolar disorder.   Chronic Venous Stasis Ulcers  Fever Admitted for treatment of bilateral chronic venous stasis that have failed to improve 2/2 to noncompliance. She is medically stable and ready for disposition to SNF/ALF or home. Keflex started 9/9 for total 10 days of abx treatment, end date 9/16.  Afebrile overnight with heart rate appropriately 70s.  Decreased erythema on bilateral lower extremities, wounds still have foul odor but no purulent discharge. Blood culture no growth x2 days.  Patient willing to go to SNF.   - s/p IV Vanc and Cefepime (9/7-9/9), NG x2 d on blood culture - continue keflex 500 mg q6h end date 9/16 - follow up case management and wound care recs: awaiting bed -PMR signed off - elevate LE's  - Tramadol 50mg  q6 prn for pain   UTI Urine culture reveals greater than 100,000 colony-forming units of E. coli.  Only resistant to ampicillin, and ampicillin/sulbactam.  She started Keflex 9/9 for 10 total days of treatment, end date 9/16. 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  They do not  recommend any medications as she declines them at this time.  They suggest not splitting staff and keeping clear boundaries. -Psychiatry signed off at this time.  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  - PMR agrees with PT/OT to SNF, patient willing.  Awaiting placement. - Tramadol 50mg  q6 PRN -Continue gabapentin 400mg  TID  - continue Duloxetine 30 mg PO qd for neuropathic pain  Paroxysmal A fib Patient has a well-known history of paroxysmal A. fib, which has been seen during this hospital admission.  She is stable and has no chest pain, no dizziness, no other symptoms.  She declines anticoagulation by this team and from her PCP.  AKI: resolved Cr 2.12 on admission, improved to 1.14 on September 10 (baseline ~1.0).  Appropriate p.o. intake. - avoid nephrotoxic agents  Anemia: stable Hemoglobin stable to 8.8 on 9/10 (Baseline  10-11). No signs of bleeding on exam. Iron studies revealed low iron to 18, elevated TIBC to 129, ferritin high at 233 likely due to infection.  Likely iron deficiency anemia.  No need to monitor in the hospital any longer. - continue ferrous sulfate  History of cocaine use Patient revealed this morning that she has a history of smoking crack cocaine.  She says she is currently not doing this and wants to stay off cocaine.   FEN/GI: Heart healthy diet, MiraLAX PPx: Heparin  Disposition: To SNF vs ALF when placement available  Subjective:  Patient feels much better this morning, she still  has leg pain when changing ulcer bandages.  She has decreased swelling and erythema, feet are well perfused. Ready for discharge to SNF/ALF.  Objective: Temp:  [98.3 F (36.8 C)-98.7 F (37.1 C)] 98.4 F (36.9 C) (09/11 0412) Pulse Rate:  [73-79] 75 (09/11 0412) Resp:  [18] 18 (09/10 1152) BP: (91-117)/(55-59) 116/57 (09/11 0412) SpO2:  [96 %-97 %] 96  % (09/11 0412) Physical Exam: General: Pleasant, resting comfortably in bed, no acute distress Cardiovascular: Regular rate and rhythm, no murmur/rub/gallop Respiratory: Clear to auscultation bilaterally with normal work of breathing Abdomen: Soft, nontender, nondistended, normoactive bowel sounds Extremities: Cutaneous wound to lateral right lower extremity, with decreasing erythema, very tender to palpation, malodorous drainage, left lower extremity with thickened scaly tissue with creasing erythema, lower extremities both appropriately and equally warm, normal tone, 2+ pedal pulses bilaterally Neuro: AOx3, pleasant today, no splitting  Laboratory: Recent Labs  Lab 05/05/19 0321 05/06/19 0317 05/07/19 0258  WBC 7.2 5.1 5.9  HGB 8.4* 8.1* 8.8*  HCT 27.8* 26.1* 29.0*  PLT 304 276 292   Recent Labs  Lab 05/03/19 2331  05/05/19 0321 05/06/19 0317 05/07/19 0258  NA 135   < > 137 135 135  K 4.4   < > 3.9 4.2 4.1  CL 100   < > 108 106 104  CO2 22   < > 20* 23 23  BUN 36*   < > 23 26* 24*  CREATININE 2.12*   < > 1.22* 1.29* 1.14*  CALCIUM 8.7*   < > 8.1* 8.1* 8.5*  PROT 7.9  --   --   --   --   BILITOT 0.3  --   --   --   --   ALKPHOS 75  --   --   --   --   ALT 17  --   --   --   --   AST 17  --   --   --   --   GLUCOSE 141*   < > 93 129* 104*   < > = values in this interval not displayed.    Imaging/Diagnostic Tests: No results found.  Shirlean MylarMahoney, Donyell Carrell, MD 05/08/2019, 8:38 AM PGY-1, [facility] FPTS Intern pager: [phone], text pages welcome

## 2019-05-08 NOTE — Progress Notes (Addendum)
Physical Therapy Treatment Patient Details Name: Caroline HesselbachSusan M Lacuesta MRN: [id number] DOB: 04/27/1951 Today's Date: 05/08/2019    History of Present Illness Patient is a [age] y/o female who presents with bilateral chronic venous stasis that have failed to improve 2/2 to noncompliance. PHMx:Bipolar I disorder with mania    PT Comments    Pt self limiting due to "burning," pain in bilateral lower extremities and decreased willingness to allow PT to assist. Pt requiring min-mod assist for bed mobility, requiring up to 35 minutes to progress to edge of bed. Once sitting on edge of bed, pt then defers transfer to chair. Would continue to benefit from SNF at discharge.     Follow Up Recommendations  SNF;Supervision for mobility/OOB;Supervision/Assistance - 24 hour     Equipment Recommendations  Other (comment)(TBA)    Recommendations for Other Services       Precautions / Restrictions Precautions Precautions: Fall Precaution Comments: wounds distal BLEs Restrictions Weight Bearing Restrictions: No    Mobility  Bed Mobility Overal bed mobility: Needs Assistance Bed Mobility: Sit to Supine     Supine to sit: Mod assist;HOB elevated Sit to supine: Min assist   General bed mobility comments: Supine>sit: Pt asked to for therapists arm to pull up on to go from supine to sit to get trunk off of bed as well as asked to help her shift shoulders around with use of pillow. Sit>supine: min A to position legs and hips  Transfers                 General transfer comment: deferred by pt  Ambulation/Gait                 Stairs             Wheelchair Mobility    Modified Rankin (Stroke Patients Only)       Balance Overall balance assessment: Needs assistance Sitting-balance support: No upper extremity supported;Feet unsupported Sitting balance-Leahy Scale: Fair                                      Cognition Arousal/Alertness: Awake/alert Behavior  During Therapy: Agitated;Anxious Overall Cognitive Status: No family/caregiver present to determine baseline cognitive functioning Area of Impairment: Following commands;Safety/judgement;Problem solving                       Following Commands: Follows one step commands with increased time Safety/Judgement: Decreased awareness of safety   Problem Solving: Slow processing;Difficulty sequencing;Requires verbal cues;Requires tactile cues General Comments: Agitated when we would try to help her or suggest we help her or tried to encouarge her on--"If you keep rushing me, I'll just sit here longer" ([facility] "Don't help me, I am independent and can do it myself"--then not a minute later, "well you could help me" but then when asked her if we could do this or that to help her she would say no.      Exercises      General Comments        Pertinent Vitals/Pain Pain Assessment: Faces Faces Pain Scale: Hurts whole lot Pain Location: distal BLEs Pain Descriptors / Indicators: Burning;Sore Pain Intervention(s): Limited activity within patient's tolerance;Monitored during session;Patient requesting pain meds-RN notified    Home Living  Prior Function            PT Goals (current goals can now be found in the care plan section) Acute Rehab PT Goals Patient Stated Goal: "be independent." Potential to Achieve Goals: Fair    Frequency    Min 2X/week      PT Plan Current plan remains appropriate    Co-evaluation PT/OT/SLP Co-Evaluation/Treatment: Yes Reason for Co-Treatment: Necessary to address cognition/behavior during functional activity;For patient/therapist safety;To address functional/ADL transfers PT goals addressed during session: Mobility/safety with mobility OT goals addressed during session: Strengthening/ROM      AM-PAC PT "6 Clicks" Mobility   Outcome Measure  Help needed turning from your back to  your side while in a flat bed without using bedrails?: A Little Help needed moving from lying on your back to sitting on the side of a flat bed without using bedrails?: A Lot Help needed moving to and from a bed to a chair (including a wheelchair)?: A Lot Help needed standing up from a chair using your arms (e.g., wheelchair or bedside chair)?: A Lot Help needed to walk in hospital room?: A Lot Help needed climbing 3-5 steps with a railing? : Total 6 Click Score: 12    End of Session   Activity Tolerance: Other (comment)(self limiting) Patient left: in bed;with call bell/phone within reach Nurse Communication: Mobility status PT Visit Diagnosis: Pain;Difficulty in walking, not elsewhere classified (R26.2);Unsteadiness on feet (R26.81);Muscle weakness (generalized) (M62.81)     Time: 2778-2423 PT Time Calculation (min) (ACUTE ONLY): 43 min  Charges:  $Therapeutic Activity: 8-22 mins                     Ellamae Sia, PT, DPT Acute Rehabilitation Services Pager [phone] Office [phone]    Willy Eddy 05/08/2019, 10:30 AM

## 2019-05-08 NOTE — Progress Notes (Signed)
FPTS Brief Note  Signed form for MD on chart. Discussed disposition with patient. She reports she does not wish to go to SNF as she would have to give up her income each month. Discussed at length. Reviewed risks of returning home, including falls and infection. She voiced understanding, able to repeat risks.  Will plan for discharge with [facility]. - Patient does not wish to have [place]. Will work to arrange wound care twice weekly. - Medicaid taxi to wound care. - Will assess for need for assistive device at home.  - Will need to discuss with PT/OT/LCSW on 9/12 to arrange above.   Dorris Singh, MD  Family Medicine Teaching Service

## 2019-05-09 DIAGNOSIS — N179 Acute kidney failure, unspecified: Secondary | ICD-10-CM | POA: Diagnosis not present

## 2019-05-09 DIAGNOSIS — N3000 Acute cystitis without hematuria: Secondary | ICD-10-CM | POA: Diagnosis not present

## 2019-05-09 DIAGNOSIS — L98499 Non-pressure chronic ulcer of skin of other sites with unspecified severity: Secondary | ICD-10-CM | POA: Diagnosis not present

## 2019-05-09 DIAGNOSIS — F4325 Adjustment disorder with mixed disturbance of emotions and conduct: Secondary | ICD-10-CM | POA: Diagnosis not present

## 2019-05-09 LAB — CULTURE, BLOOD (ROUTINE X 2)
Culture: NO GROWTH
Culture: NO GROWTH
Special Requests: ADEQUATE
Special Requests: ADEQUATE

## 2019-05-09 MED ORDER — TRAMADOL HCL 50 MG PO TABS
50.0000 mg | ORAL_TABLET | Freq: Four times a day (QID) | ORAL | Status: AC
  Administered 2019-05-09: 05:00:00 50 mg via ORAL
  Filled 2019-05-09: qty 1

## 2019-05-09 NOTE — Progress Notes (Addendum)
FMTS Attending Daily Note: Caroline Starrarina Jermarcus Mcfadyen, MD  Team Pager [phone] Pager [phone]  I have seen and examined this patient, reviewed their chart. I have discussed this patient with the resident. I agree with the resident's findings, assessment and care plan.  Discussed disposition at length with patient.  We discussed the importance of continued wound care while in the hospital. She is agreeable to allowing nursing to change her dressings today.  We discussed her disposition further. Discussed case with LCSW who will work with patient to arrange appropriate services.   Patient appears to have capacity to make decision but continues to have limited insight into her condition. She can enumerate the consequences of poor wound care but intermittently continues to demand left lower extremity amputation. Psychiatry assessed earlier in stay, deemed to have capacity.   Family Medicine Teaching Service Daily Progress Note Intern Pager: [phone]  Patient name: [name] Medical record number: [id number] Date of birth: 11/23/1950 Age: 68 y.o. Gender: female  Primary Care Provider: Unknown [name], [name], DO Consultants: none Code Status: Full  Pt Overview and Major Events to Date:  9/7-admitted, IV cefepime and vanc started 9/8-UTI E. Coli 9/9- IV abx d/c, PO keflex started  Assessment and Plan: [name] is a 68 y.o. female presenting with lower extremity pain secondary to chronic venous stasis ulcers. PMH is significant for atrial fibrillation, anemia and bipolar disorder.   Chronic Venous Stasis Ulcers  Fever Admitted for treatment of bilateral chronic venous stasis that have failed to improve 2/2 to noncompliance. She is medically stable and ready for disposition likely to home.  There are no available beds for SNF. Patient is not interested in using her monthly income to pay for care, consequently there are no options for her to go to receive care in a facility where she could live.  She  does not have assistance in her home, she lives alone, her one companion that she identified is possibly being of assistance states that he cannot give her the 24-hour supervision and assistance that she requires.  Patient understands and can repeat back the risks of being alone, which include falls and infection.  She also will not allow medical team to look at her leg wounds, nor will she allow nursing to change dressings.  Patient also refusing to go to wound care clinic to have wounds attended to regularly.  At this point while she remains medically stable, but she will not participate in her care. Her options have been exhausted within the hospital, and she will only accept the option of going home, despite stated risks. Keflex started 9/9 for total 10 days of abx treatment, end date 9/16.  Afebrile overnight with heart rate appropriately 70s.  Decreased erythema on bilateral lower extremities on the areas that are not covered by the bandage. - s/p IV Vanc and Cefepime (9/7-9/9), NG x2 d on blood culture - continue keflex 500 mg q6h end date 9/16 - PT/OT: is patient eligible for a wheel chair? She requests a motorized one. -PMR signed off - elevate LE's  - Tramadol 50mg  q6 prn for pain   UTI Urine culture reveals greater than 100,000 colony-forming units of E. coli.  Only resistant to ampicillin, and ampicillin/sulbactam.  She started Keflex 9/9 for 10 total days of treatment, end date 9/16. - s/p cefepime IV (9/7-9/9) - contnue keflex 500 mg q6h po end date 9/16  Bipolar disorder versus personality disorder Concern for patient to have bipolar disorder with both manic  episodes and depression where she cannot take care of herself.  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  They do not recommend any medications as she declines them at this time.  They suggest not splitting staff and keeping clear boundaries. -Psychiatry signed off at this  time.  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  - PMR agrees with PT/OT to SNF, patient unwilling to pay for a facility.  - Tramadol 50mg  q6 PRN -Continue gabapentin 400mg  TID  - continue Duloxetine 30 mg PO qd for neuropathic pain  Paroxysmal A fib Patient has a well-known history of paroxysmal A. fib, which has been seen during this hospital admission.  She is stable and has no chest pain, no dizziness, no other symptoms.  She declines anticoagulation by this team and from her PCP.  AKI: resolved Cr 2.12 on admission, improved to 1.14 on September 10 (baseline ~1.0).  Appropriate p.o. intake. - avoid nephrotoxic agents  Anemia: stable Hemoglobin stable to 8.8 on 9/10 (Baseline  10-11). No signs of bleeding on exam. Iron studies revealed low iron to 18, elevated TIBC to 129, ferritin high at 233 likely due to infection.  Likely iron deficiency anemia.  No need to monitor in the hospital any longer. - continue ferrous sulfate  History of cocaine use Patient revealed this morning that she has a history of smoking crack cocaine.  She says she is currently not doing this and wants to stay off cocaine.   FEN/GI: Heart healthy diet, MiraLAX PPx: Heparin  Disposition: Stable for discharge when at home services (or SNF pending patient preference) is available   Subjective:  Patient feels better this morning, she still has leg pain when changing ulcer bandages.  He is able to swing her legs off the bed, reports that she does calisthenics.  She has decreased swelling and erythema, feet are well perfused. Ready for discharge.  Objective: Temp:  [98.4 F (36.9 C)-99.2 F (37.3 C)] 98.4 F (36.9 C) (09/12 0913) Pulse Rate:  [74-80] 80 (09/12 0913) Resp:  [18] 18 (09/12 0913) BP: (99-119)/(53-84) 99/65 (09/12 0913) SpO2:  [96 %-98 %] 98 % (09/12 0913) Physical  Exam: General: Pleasant, resting comfortably in bed, no acute distress Cardiovascular: Regular rate and rhythm, no murmur/rub/gallop Respiratory: Clear to auscultation bilaterally with normal work of breathing Abdomen: Soft, nontender, nondistended, normoactive bowel sounds Extremities: Cutaneous wound to lateral right lower extremity, with decreasing erythema, very tender to palpation Neuro: AOx3, pleasant today, no splitting  Laboratory: Recent Labs  Lab 05/05/19 0321 05/06/19 0317 05/07/19 0258  WBC 7.2 5.1 5.9  HGB 8.4* 8.1* 8.8*  HCT 27.8* 26.1* 29.0*  PLT 304 276 292   Recent Labs  Lab 05/03/19 2331  05/05/19 0321 05/06/19 0317 05/07/19 0258  NA 135   < > 137 135 135  K 4.4   < > 3.9 4.2 4.1  CL 100   < > 108 106 104  CO2 22   < > 20* 23 23  BUN 36*   < > 23 26* 24*  CREATININE 2.12*   < > 1.22* 1.29* 1.14*  CALCIUM 8.7*   < > 8.1* 8.1* 8.5*  PROT 7.9  --   --   --   --   BILITOT 0.3  --   --   --   --   ALKPHOS 75  --   --   --   --  ALT 17  --   --   --   --   AST 17  --   --   --   --   GLUCOSE 141*   < > 93 129* 104*   < > = values in this interval not displayed.    Imaging/Diagnostic Tests: No results found.  Shirlean Mylar, MD 05/09/2019, 9:36 AM PGY-1, [facility] Family Medicine FPTS Intern pager: [phone], text pages welcome

## 2019-05-09 NOTE — TOC Progression Note (Signed)
Transition of Care [facility]) - Progression Note    Patient Details  Name: [name] MRN: [id number] Date of Birth: 1950/08/29  Transition of Care [facility]) CM/SW [place], [place] Phone Number: [phone] 05/09/2019, 3:11 PM  Clinical Narrative:     CSW received a message from RN case manager stating that patient wanted to go to SNF. CSW met with patient bedside and she let CSW that she wanted to go to SNF however she did not want to give up her check. Patient informed CSW that she had medicare. CSW found evidence of medicare in patient chart therefore is should not interfere with her check.  Patient informed CSW that she would like to go to [place]. CSW called Ebony Hail at [facility] and inquired about the reason for declining.Ebony Hail stated that they were unable to take patient and knew about medicare insurance.  There are no bed offers for patient.  CSW will continue to follow for discharge planning.  Expected Discharge Plan: [place] Barriers to Discharge: Continued Medical Work up  Expected Discharge Plan and Services Expected Discharge Plan: [place] Choice: [place] arrangements for the past 2 months: Single Family Home                                       Social Determinants of Health (SDOH) Interventions    Readmission Risk Interventions No flowsheet data found.

## 2019-05-09 NOTE — Progress Notes (Signed)
Pt adamantly refusing, in presence of MD, to have dressing changes done on her legs today.  Pt states "my legs will take care of themselves".  Will continue to monitor.

## 2019-05-09 NOTE — Progress Notes (Signed)
Pt again refuses dressing changes. States her R leg is in too much pain, and that "we aren't going to be touching it". Paged on-call and did receive order for x1 extra dose of Ultram. She continues to state she wants the R leg amputated.

## 2019-05-10 DIAGNOSIS — L98499 Non-pressure chronic ulcer of skin of other sites with unspecified severity: Secondary | ICD-10-CM | POA: Diagnosis not present

## 2019-05-10 DIAGNOSIS — L089 Local infection of the skin and subcutaneous tissue, unspecified: Secondary | ICD-10-CM | POA: Diagnosis not present

## 2019-05-10 DIAGNOSIS — Z609 Problem related to social environment, unspecified: Secondary | ICD-10-CM | POA: Diagnosis not present

## 2019-05-10 DIAGNOSIS — N3000 Acute cystitis without hematuria: Secondary | ICD-10-CM | POA: Diagnosis not present

## 2019-05-10 MED ORDER — TRAMADOL HCL 50 MG PO TABS
100.0000 mg | ORAL_TABLET | Freq: Once | ORAL | Status: AC
  Administered 2019-05-10: 100 mg via ORAL
  Filled 2019-05-10: qty 2

## 2019-05-10 NOTE — TOC Progression Note (Signed)
Transition of Care [facility]) - Progression Note    Patient Details  Name: [name] MRN: [id number] Date of Birth: 10/14/1950  Transition of Care [facility]) CM/SW [place], LCSW Phone Number:  [phone] 05/10/2019, 10:59 AM  Clinical Narrative:     CSW spoke with Vicente Serene in patient registration and was notified that patient only had Medicare B.  CSW spoke with patient at bedside and patient appeared confused stating that someone informed her that she had a bed offer as well as not needing to release her check. CSW questioned patient further and she could not tell her the person name. CSW clarified with patient that she only has medicare part b and there are still no be offers. Patient was becoming agiated. Patient stated that she wants to go home with [facility].  CSW notified case management of patient's request.  CSW spoke with MD about patient's updates.     Expected Discharge Plan: [place] Barriers to Discharge: Continued Medical Work up  Expected Discharge Plan and Services Expected Discharge Plan: [place] Choice: [place] arrangements for the past 2 months: Single Family Home                                       Social Determinants of Health (SDOH) Interventions    Readmission Risk Interventions No flowsheet data found.

## 2019-05-10 NOTE — Progress Notes (Signed)
Pt complains of headaches that have occurred since admission.  Pt states it probably occurred because of her falls before she was admitted.  It feels like "she's been hit on her head." Neuro checks and vital signs were normal. Gave 50 mg of Ultram pill.  On call physician was informed and stated that the AM doctor will discuss the headaches with her.  Pt resting in bed.  Will continue to monitor.  Lupita Dawn, RN

## 2019-05-10 NOTE — Progress Notes (Signed)
Pt refusing dressing changes on her legs today and will not allow RN to touch her lower legs.  Will continue to monitor.

## 2019-05-10 NOTE — Progress Notes (Signed)
Family Medicine Teaching Service Daily Progress Note Intern Pager: [phone]  Patient name: [name] Medical record number: [id number] Date of birth: [birth year] Age: 68 y.o. Gender: female  Primary Care Provider: Cleophas Dunker, DO Consultants: none Code Status: Full  Pt Overview and Major Events to Date:  9/7-admitted, IV cefepime and vanc started 9/8-UTI E. Coli 9/9- IV abx d/c, PO keflex started  Assessment and Plan: TALIN ROZEBOOM is a 68 y.o. female presenting with lower extremity pain secondary to chronic venous stasis ulcers. PMH is significant for atrial fibrillation, anemia and bipolar disorder.   Chronic Venous Stasis Ulcers Patient agreeable to go to SNF (as of 05/09/19). She is medically stable once she obtains a bed and can be discharged. Lower extremity is stable. Vitals wnl overnight, no fever. Patient requested a motorized chair. PT/OT on board, was not able to see patient on 9/12. Patient would like dressings changed today but will requests only performed by certain people.  - continue keflex 500 mg q6h, day 7/10, end date 9/16 - PT/OT- motorized wheelchair exam (if can be done in hospital)  - consult SW today for SNF placement  - elevate LE's  - Tramadol 50mg  q6 prn for pain   UTI, Ecoli   S/p IV cefepime (9/7-9/9) - contnue keflex 500 mg q6h po end date 9/16  Personality Disorder  Psych consulted for concerns of bipolar and they believe her behavior is 2/2 to personality disorder. Psychiatry signed off. No meds as pt declines.   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  Continue supportive care.  - Tramadol 50mg  q6 PRN - Continue gabapentin 400mg  TID  - continue Duloxetine 30 mg PO qd for neuropathic pain  Paroxysmal A fib - Pt declining anticoagulation.   Anemia: stable - continue ferrous sulfate   History of cocaine use AKI,  Resolved    FEN/GI: Heart healthy diet, MiraLAX PPx: Heparin  Disposition: Stable for d/c when SNF obtained   Subjective:  NAEO.   Objective: Temp:  [98.4 F (36.9 C)-99.2 F (37.3 C)] 99.2 F (37.3 C) (09/13 0421) Pulse Rate:  [74-81] 76 (09/13 0421) Resp:  [18-19] 19 (09/13 0421) BP: (94-105)/(47-84) 94/63 (09/13 0421) SpO2:  [96 %-98 %] 96 % (09/13 0421)  Physical Exam: General: NAD,dishelved, white female sitting comfortably in bed   HEENT: [place]/AT. PERRLA. EOMI.  Cardiovascular: irregular, normal S1, S2. B/L 2+ RP. No BLEE Respiratory: CTAB. No IWOB.  Abdomen: + BS. NT, ND, soft to palpation.  Extremities: Warm and well perfused. Moving spontaneously.  Integumentary: see media. Improving B/L lower extremities  Neuro: CN grossly intact. No FND        Laboratory: Recent Labs  Lab 05/05/19 0321 05/06/19 0317 05/07/19 0258  WBC 7.2 5.1 5.9  HGB 8.4* 8.1* 8.8*  HCT 27.8* 26.1* 29.0*  PLT 304 276 292   Recent Labs  Lab 05/03/19 2331  05/05/19 0321 05/06/19 0317 05/07/19 0258  NA 135   < > 137 135 135  K 4.4   < > 3.9 4.2 4.1  CL 100   < > 108 106 104  CO2 22   < > 20* 23 23  BUN 36*   < > 23 26* 24*  CREATININE 2.12*   < > 1.22* 1.29* 1.14*  CALCIUM 8.7*   < > 8.1* 8.1* 8.5*  PROT 7.9  --   --   --   --  BILITOT 0.3  --   --   --   --   ALKPHOS 75  --   --   --   --   ALT 17  --   --   --   --   AST 17  --   --   --   --   GLUCOSE 141*   < > 93 129* 104*   < > = values in this interval not displayed.    Imaging/Diagnostic Tests: No results found.  Melene PlanKim, Yanilen Adamik E, MD 05/10/2019, 5:48 AM PGY-2, [place] [facility] FPTS Intern pager: [phone], text pages welcome

## 2019-05-10 NOTE — Progress Notes (Signed)
The pt refused to let the nurse change the dressings on both legs.  Pt resting in bed.  Will continue to monitor pt.  Lupita Dawn, RN

## 2019-05-11 DIAGNOSIS — Z609 Problem related to social environment, unspecified: Secondary | ICD-10-CM | POA: Diagnosis not present

## 2019-05-11 DIAGNOSIS — G14 Postpolio syndrome: Secondary | ICD-10-CM | POA: Diagnosis not present

## 2019-05-11 DIAGNOSIS — L03116 Cellulitis of left lower limb: Secondary | ICD-10-CM | POA: Diagnosis not present

## 2019-05-11 DIAGNOSIS — L98499 Non-pressure chronic ulcer of skin of other sites with unspecified severity: Secondary | ICD-10-CM | POA: Diagnosis not present

## 2019-05-11 DIAGNOSIS — L089 Local infection of the skin and subcutaneous tissue, unspecified: Secondary | ICD-10-CM | POA: Diagnosis not present

## 2019-05-11 DIAGNOSIS — N3000 Acute cystitis without hematuria: Secondary | ICD-10-CM | POA: Diagnosis not present

## 2019-05-11 NOTE — Progress Notes (Signed)
Pt complains of constant headaches and states the 50 mg Ultram pill she's receiving doesn't really help alleviate the pain.  The on call physician was paged to see if pt could get another medicine for her headaches.  The MD ordered a one time Ultram 100 mg tablets by mouth.  Will continue to monitor.  Lupita Dawn, RN

## 2019-05-11 NOTE — Progress Notes (Signed)
Pt requested to have the dressing on her bilateral legs changed because the dressings were irritating her.  After removing the dressings, the nurse cleaned both legs and placed new dressings.  Pt requested Vaseline gauze to be placed over the wounds and to not place compression wrappings on her legs.  Pt currently resting in bed.  Will continue to monitor.  Lupita Dawn, RN

## 2019-05-11 NOTE — Progress Notes (Signed)
Physical Therapy Treatment Patient Details Name: ANNALEAH ARATA MRN: [id number] DOB: 11-26-1950 Today's Date: 05/11/2019    History of Present Illness Patient is a 68 y/o female who presents with bilateral chronic venous stasis that have failed to improve 2/2 to noncompliance. PHMx:Bipolar I disorder with mania    PT Comments    Patient participated in limited in bed session demonstrating how she moves her legs in bed for "exercise." but refused to do any more than 1 repetition.  She declined any EOB or OOB activity reporting she got up earlier today, but RN reports she did not.  She relates she knows she cannot take care of herself and that she cannot return to her apartment because she needs 24 hour assistance.  Do not feel there is any home equipment that would possibly make her safe to be home alone.  Continue to recommend SNF as pt unable to take care of or mobilize on her own. She is agreeable at this moment to go to SNF  RN case manager aware, but reports no current bed offers.   Follow Up Recommendations  SNF;Supervision for mobility/OOB;Supervision/Assistance - 24 hour     Equipment Recommendations  Other (comment)(if home would need hoyer lift, hospital bed, w/c and 24 hour assist)    Recommendations for Other Services       Precautions / Restrictions Precautions Precautions: Fall Precaution Comments: wounds distal BLEs    Mobility  Bed Mobility               General bed mobility comments: could not engage in mobility to EOB or OOB due to reports she had already completed earlier today up to recliner and back to bed, but RN reports this did not happen  Transfers                    Ambulation/Gait                 Stairs             Wheelchair Mobility    Modified Rankin (Stroke Patients Only)       Balance                                            Cognition Arousal/Alertness: Awake/alert Behavior During Therapy:  Anxious;Agitated Overall Cognitive Status: History of cognitive impairments - at baseline Area of Impairment: Safety/judgement;Memory;Problem solving                     Memory: Decreased short-term memory Following Commands: Follows one step commands inconsistently;Follows one step commands with increased time Safety/Judgement: Decreased awareness of safety   Problem Solving: Slow processing;Decreased initiation;Requires verbal cues;Requires tactile cues        Exercises General Exercises - Lower Extremity Ankle Circles/Pumps: AROM;Both;5 reps;Supine Heel Slides: AROM;Both;Supine;Other (comment)(1 repitition)    General Comments General comments (skin integrity, edema, etc.): Patient focused on getting her TV from her apartment at [facility], reports she knows she cannot take care of herself and will need round the clock care; wants to talk with property manager at [facility] about having to leave; also reports needs to get rid of her couch because has bugs in it      Pertinent Vitals/Pain Pain Assessment: Faces Faces Pain Scale: Hurts little more Pain Location: L knee Pain Descriptors / Indicators: Tender Pain  Intervention(s): Limited activity within patient's tolerance;Monitored during session    Home Living                      Prior Function            PT Goals (current goals can now be found in the care plan section) Progress towards PT goals: Not progressing toward goals - comment(self limited to in bed only this session)    Frequency    Min 2X/week      PT Plan Current plan remains appropriate    Co-evaluation              AM-PAC PT "6 Clicks" Mobility   Outcome Measure  Help needed turning from your back to your side while in a flat bed without using bedrails?: A Little Help needed moving from lying on your back to sitting on the side of a flat bed without using bedrails?: A Lot Help needed moving to and from a bed to a chair  (including a wheelchair)?: Total Help needed standing up from a chair using your arms (e.g., wheelchair or bedside chair)?: Total Help needed to walk in hospital room?: Total Help needed climbing 3-5 steps with a railing? : Total 6 Click Score: 9    End of Session   Activity Tolerance: Other (comment)(self limiting) Patient left: in bed;with call bell/phone within reach   PT Visit Diagnosis: Unsteadiness on feet (R26.81);Muscle weakness (generalized) (M62.81);Other abnormalities of gait and mobility (R26.89)     Time: 1515-1530 PT Time Calculation (min) (ACUTE ONLY): 15 min  Charges:  $Therapeutic Activity: 8-22 mins                     Sheran LawlessCyndi Wynn, South CarolinaPT Acute Rehabilitation Services [phone] 05/11/2019    Elray Mcgregorynthia Wynn 05/11/2019, 4:40 PM

## 2019-05-11 NOTE — Progress Notes (Signed)
Family Medicine Teaching Service Daily Progress Note Intern Pager: [phone]  Patient name: [name] Medical record number: [id number] Date of birth: [birth year] Age: 68 y.o. Gender: female  Primary Care Provider: Cleophas Dunker, DO Consultants: none Code Status: Full  Pt Overview and Major Events to Date:  9/7-admitted, IV cefepime and vanc started 9/8-UTI E. Coli 9/9- IV abx d/c, PO keflex started  Assessment and Plan: [name] is a 68 y.o. female presenting with lower extremity pain secondary to chronic venous stasis ulcers. PMH is significant for atrial fibrillation, anemia and bipolar disorder.   Chronic Venous Stasis Ulcers Patient remains medically stable and is appropriate for discharge. Patient would like to go home and declines going to SNF. Patient remains afebrile.  This morning she denies pain.  On exam, bilateral wounds wrapped without saturation of dressing. Patient receives wound care in the evening.  Venostasis ulcers are improving.  Patient has requested a motorized chair.  Awaiting PT recommendations regarding patient medical devices.  - continue keflex 500 mg q6h, day 8/10, end date 9/16 - PT/OT- motorized wheelchair exam - SW regarding disposition  - elevate LE's  - Tramadol 50mg  q6 prn for pain   UTI, Ecoli   S/p IV cefepime (9/7-9/9) - contnue keflex 500 mg q6h po end date 9/16  Personality Disorder  Psych consulted for concerns of bipolar and they believe her behavior is 2/2 to personality disorder. Psychiatry signed off. No meds as pt declines.   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  Continue supportive care.  - Tramadol 50mg  q6 PRN - Continue gabapentin 400mg  TID  - continue Duloxetine 30 mg PO qd for neuropathic pain  Paroxysmal A fib - Pt declining anticoagulation.   Anemia: stable - continue ferrous sulfate    History of cocaine use AKI, Resolved    FEN/GI: Heart healthy diet, MiraLAX PPx: Heparin  Disposition: Stable likely home as pt declines SNF  Subjective:  Patient denies pain and there were no overnight significant events.   Objective: Temp:  [99 F (37.2 C)-99.8 F (37.7 C)] 99 F (37.2 C) (09/14 0803) Pulse Rate:  [81-84] 83 (09/14 0803) Resp:  [12-18] 12 (09/14 0803) BP: (99-113)/(48-56) 99/53 (09/14 0803) SpO2:  [92 %-96 %] 94 % (09/14 0803)  Physical Exam: GEN: elderly female, in no acute distress sitting upright in bed  CV: regular rate with irregularly irregular rhythm, no murmurs appreciated  RESP: no increased work of breathing, clear to ascultation bilaterally with no crackles, wheezes, or rhonchi  ABD: Soft, Nontender, Nondistended.  SKIN: warm, dry, b/l lower extremities wrapped, dressing clean and dry  NEURO: grossly normal, moves all extremities appropriately    Laboratory: Recent Labs  Lab 05/05/19 0321 05/06/19 0317 05/07/19 0258  WBC 7.2 5.1 5.9  HGB 8.4* 8.1* 8.8*  HCT 27.8* 26.1* 29.0*  PLT 304 276 292   Recent Labs  Lab 05/05/19 0321 05/06/19 0317 05/07/19 0258  NA 137 135 135  K 3.9 4.2 4.1  CL 108 106 104  CO2 20* 23 23  BUN 23 26* 24*  CREATININE 1.22* 1.29* 1.14*  CALCIUM 8.1* 8.1* 8.5*  GLUCOSE 93 129* 104*    Imaging/Diagnostic Tests: No results found.  Lyndee Hensen, MD 05/11/2019, 9:54 AM PGY-1, [place] Intern pager: [phone], text pages welcome

## 2019-05-11 NOTE — TOC Progression Note (Signed)
Transition of Care (TOC) - Progression Note  Marvetta Gibbons RN, BSN Transitions of Care Unit 4E- RN Case Manager [phone]   Patient Details  Name: [name] MRN: [id number] Date of Birth: 1951-01-26  Transition of Care [facility]) CM/SW Contact  Dahlia Client, Romeo Rabon, RN Phone Number: 05/11/2019, 1:26 PM  Clinical Narrative:    Noted pt wants to return home with [facility] services- pt has been dismissed from multiple [facility] agencies. Calls made to local [facility] agencies on pt's behalf to see about [facility] referral-  [facility] No- pt dismissed from them [facility]- unable to accept [facility]- unable to accept referral [facility]- No- pt dismissed from them [facility]- No- pt dismissed from them [facility]- unable to accept referral [facility]. - No availability for staffing nursing needs [facility]- No availability for RN staffing [place]- No staffing available for wound needs at this time [facility]- unable to accept referral [facility]- No- pt dismissed from them  At this time there is no Home Health agency that can accept referral for [facility] needs. CM also called [employer]- regarding pt's PCS services- pt was assessed in 2019 and had PCS aide services with [facility]- however was dismissed from them and never chose a new agency to provide services. Per Benancio Deeds at [employer]. Pt would require a new referral and assessment at this time for [facility] services. Pt may need to be referred to outpt services and wound care center- however concerned that she would comply with this due to history of non-compliance and mobility issues. Feel like [facility] is still pt's best option however pt would need to be agreeable to this and willing to give up her check.    Expected Discharge Plan: [place] Barriers to Discharge: Continued Medical Work up  Expected Discharge Plan and Services Expected Discharge  Plan: [place]: [place] arrangements for the past 2 months: Single Family Home                                       Social Determinants of Health (SDOH) Interventions    Readmission Risk Interventions No flowsheet data found.

## 2019-05-12 DIAGNOSIS — G894 Chronic pain syndrome: Secondary | ICD-10-CM | POA: Diagnosis not present

## 2019-05-12 DIAGNOSIS — N3000 Acute cystitis without hematuria: Secondary | ICD-10-CM | POA: Diagnosis not present

## 2019-05-12 DIAGNOSIS — I83012 Varicose veins of right lower extremity with ulcer of calf: Secondary | ICD-10-CM | POA: Diagnosis not present

## 2019-05-12 DIAGNOSIS — Z609 Problem related to social environment, unspecified: Secondary | ICD-10-CM | POA: Diagnosis not present

## 2019-05-12 DIAGNOSIS — F4325 Adjustment disorder with mixed disturbance of emotions and conduct: Secondary | ICD-10-CM | POA: Diagnosis not present

## 2019-05-12 DIAGNOSIS — L03116 Cellulitis of left lower limb: Secondary | ICD-10-CM | POA: Diagnosis not present

## 2019-05-12 MED ORDER — POLYETHYLENE GLYCOL 3350 17 G PO PACK
17.0000 g | PACK | Freq: Two times a day (BID) | ORAL | Status: DC
  Administered 2019-05-12 – 2019-05-15 (×3): 17 g via ORAL
  Filled 2019-05-12 (×7): qty 1

## 2019-05-12 NOTE — Progress Notes (Signed)
Physical Therapy Treatment Patient Details Name: [name] MRN: [id number] DOB: Jan 10, 1951 Today's Date: 05/12/2019    History of Present Illness Patient is a 68 y/o female who presents with bilateral chronic venous stasis that have failed to improve 2/2 to noncompliance. PMHx:Bipolar I disorder with mania, afib    PT Comments    Pt continues to be self-limiting with limited participation and highly fluctuant willingness to mobilize even within session. Pt initially stating she wasn't going to get OOB but would move LLE but with moving in bed noted soaked linens. Pt then willing to get to EOB for linen change. Ultimately pt requested OOB to chair and achieved this with 2 person assist as pt refusing to utilize RW with STdW obtained end of session and pt still refusing. Pt stating she is agreeable to SNF at this time as she is aware she cannot care for herself but doesn't want to stay there. Pt educated for the need to continue to work with therapy when present and that strengthening (her goal) comes with movement. Will continue to follow as pt willing to participate.  Pt reported dizziness with getting EOB with BP 117/41 (58), 125/52 (64) after standing   Follow Up Recommendations  SNF;Supervision for mobility/OOB;Supervision/Assistance - 24 hour     Equipment Recommendations  Wheelchair (measurements PT);Wheelchair cushion (measurements PT)    Recommendations for Other Services       Precautions / Restrictions Precautions Precautions: Fall Precaution Comments: wounds distal BLEs    Mobility  Bed Mobility Overal bed mobility: Modified Independent Bed Mobility: Supine to Sit     Supine to sit: HOB elevated     General bed mobility comments: pt moving to EOb with rail and HOB 30 degrees without assist  Transfers Overall transfer level: Needs assistance   Transfers: Sit to/from Stand;Stand Pivot Transfers Sit to Stand: Mod assist;+2 physical assistance Stand pivot  transfers: Mod assist;+2 physical assistance       General transfer comment: pt grabbing NT and P.T. arms to stand, refused use of RW or StdW x 2 trials to stand due to soaked linens. Pt then asking to get to chair with 2 person UE support with increased time and pt again dismissive of cues for weight shifting and ease of transfer  Ambulation/Gait             General Gait Details: Unable   Stairs             Wheelchair Mobility    Modified Rankin (Stroke Patients Only)       Balance Overall balance assessment: Needs assistance Sitting-balance support: No upper extremity supported;Feet supported Sitting balance-Leahy Scale: Fair     Standing balance support: During functional activity Standing balance-Leahy Scale: Poor Standing balance comment: Requires external support in standing.                            Cognition Arousal/Alertness: Awake/alert Behavior During Therapy: Anxious;Agitated Overall Cognitive Status: History of cognitive impairments - at baseline Area of Impairment: Safety/judgement;Memory;Problem solving                     Memory: Decreased short-term memory Following Commands: Follows one step commands inconsistently;Follows one step commands with increased time Safety/Judgement: Decreased awareness of safety   Problem Solving: Slow processing;Decreased initiation;Requires verbal cues;Requires tactile cues General Comments: " I will do it my way, don't help me" "I move when i want to when no  one is here" . Pt very flippant between easily agitated and stating she is aware she needs assist and cannot walk on her own. Pt not open to education for ease of transfers or mobility and refuses using RW because "wheels aren't safe"      Exercises General Exercises - Lower Extremity Long Arc Quad: AROM;Both;Seated(3 reps)    General Comments        Pertinent Vitals/Pain Pain Score: 6  Pain Location: LLE Pain Descriptors /  Indicators: Burning Pain Intervention(s): Limited activity within patient's tolerance;Monitored during session;Repositioned    Home Living                      Prior Function            PT Goals (current goals can now be found in the care plan section) Progress towards PT goals: Progressing toward goals    Frequency    Min 2X/week      PT Plan Current plan remains appropriate    Co-evaluation              AM-PAC PT "6 Clicks" Mobility   Outcome Measure  Help needed turning from your back to your side while in a flat bed without using bedrails?: A Little Help needed moving from lying on your back to sitting on the side of a flat bed without using bedrails?: A Little Help needed moving to and from a bed to a chair (including a wheelchair)?: A Lot Help needed standing up from a chair using your arms (e.g., wheelchair or bedside chair)?: A Lot Help needed to walk in hospital room?: Total Help needed climbing 3-5 steps with a railing? : Total 6 Click Score: 12    End of Session   Activity Tolerance: Other (comment);Patient tolerated treatment well(pt not receptive to education or cueing, tolerating mobility) Patient left: in chair;with call bell/phone within reach;with chair alarm set;with nursing/sitter in room Nurse Communication: Mobility status PT Visit Diagnosis: Unsteadiness on feet (R26.81);Muscle weakness (generalized) (M62.81);Other abnormalities of gait and mobility (R26.89)     Time: [id number] PT Time Calculation (min) (ACUTE ONLY): 34 min  Charges:  $Therapeutic Activity: 23-37 mins                     Blaise Grieshaber Abner Greenspanabor Antionio Negron, PT Acute Rehabilitation Services Pager: [phone] Office: [phone]    Alexzandrea Normington B [place] 05/12/2019, 1:49 PM

## 2019-05-12 NOTE — Progress Notes (Signed)
Family Medicine Teaching Service Daily Progress Note Intern Pager: [phone]  Patient name: [name] Medical record number: [id number] Date of birth: 1950/08/28 Age: 68 y.o. Gender: female  Primary Care Provider: Cleophas Dunker, DO Consultants: none Code Status: Full  Pt Overview and Major Events to Date:  9/7-admitted, IV cefepime and vanc started 9/8-UTI E. Coli 9/9- IV abx d/c, PO keflex started  Assessment and Plan: [name] is a 68 y.o. female presenting with lower extremity pain secondary to chronic venous stasis ulcers. PMH is significant for atrial fibrillation, anemia and bipolar disorder.   Chronic Venous Stasis Ulcers Patient remains medically stable and is appropriate for discharge.  Patient with difficult disposition.  She is resistant to relinquishing her check in order to attend SNF.  She has been discharged from multiple home health agencies for being verbally inappropriate with staff.  Case management continue to work on safe disposition.  On exam, bilateral wounds are wrapped with no visible erythema and dressings are without discharge.  Per nursing's note, venous stasis ulcers are improving.  PT do not recommend any immobilization devices and safest disposition continues to be SNF. - continue keflex 500 mg q6h, day 9/10, end date 9/16 - PT/OT  -Case management, regarding disposition  - elevate LE's  - Tramadol 50mg  q6 prn for pain   UTI, Ecoli   S/p IV cefepime (9/7-9/9) - contnue keflex 500 mg q6h po end date 9/16  Personality Disorder  Psych consulted for concerns of bipolar and they believe her behavior is 2/2 to personality disorder. Psychiatry signed off. No meds as pt declines.   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  Continue supportive care.  - Tramadol 50mg  q6 PRN - Continue gabapentin 400mg  TID  - continue Duloxetine 30  mg PO qd for neuropathic pain  Paroxysmal A fib - Pt declining anticoagulation.   Anemia: stable - continue ferrous sulfate   History of cocaine use AKI, Resolved    FEN/GI: Heart healthy diet, MiraLAX PPx: Heparin  Disposition: Stable likely home as pt declines SNF  Subjective:  Patient denies pain and there were no overnight significant events.   Objective: Temp:  [97.6 F (36.4 C)-98.4 F (36.9 C)] 98.3 F (36.8 C) (09/15 0753) Pulse Rate:  [70-78] 70 (09/15 0753) Resp:  [13-18] 16 (09/15 0753) BP: (94-101)/(52-77) 94/52 (09/15 0753) SpO2:  [95 %-98 %] 95 % (09/15 0753)  Physical Exam:  GEN: elderly female in no acute distress sitting upright in bed CV: Irregularly irregular rhythm, regular rate, no murmurs appreciated  RESP: no increased work of breathing, clear to ascultation bilaterally with no crackles, wheezes, or rhonchi  ABD:  Soft, Nontender, Nondistended.  MSK: no edema, or cyanosis noted SKIN: warm, dry, bilateral lower extremities wrapped with no drainage on dressing NEURO: grossly normal, moves all extremities appropriately    Laboratory: Recent Labs  Lab 05/06/19 0317 05/07/19 0258  WBC 5.1 5.9  HGB 8.1* 8.8*  HCT 26.1* 29.0*  PLT 276 292   Recent Labs  Lab 05/06/19 0317 05/07/19 0258  NA 135 135  K 4.2 4.1  CL 106 104  CO2 23 23  BUN 26* 24*  CREATININE 1.29* 1.14*  CALCIUM 8.1* 8.5*  GLUCOSE 129* 104*    Imaging/Diagnostic Tests: No results found.  Lyndee Hensen, MD 05/12/2019, 2:51 PM PGY-1, [place] Intern pager: [phone], text pages welcome

## 2019-05-12 NOTE — TOC Progression Note (Addendum)
Transition of Care [facility]) - Progression Note    Patient Details  Name: AIRYANA SPRUNGER MRN: [id number] Date of Birth: [birth year]  Transition of Care [facility]) CM/SW [place], [place] Phone Number: 05/12/2019, 12:43 PM  Clinical Narrative:     1:18pm- CSW spoke with APS worker, Minna Merritts- she advised patient signed a refusal for APS and they are not involved.   CSW called  APS worker Cristy Friedlander [phone] - left voice message to return call.   Thurmond Butts, MSW, [facility] Clinical Social Worker [phone]    Expected Discharge Plan: Skilled Nursing Facility Barriers to Discharge: Continued Medical Work up  Expected Discharge Plan and Services Expected Discharge Plan: [place] Choice: [place] arrangements for the past 2 months: Single Family Home                                       Social Determinants of Health (SDOH) Interventions    Readmission Risk Interventions No flowsheet data found.

## 2019-05-12 NOTE — TOC Progression Note (Signed)
Transition of Care [facility]) - Progression Note    Patient Details  Name: [name] MRN: [id number] Date of Birth: [birth year]  Transition of Care [facility]) CM/SW [place], [place] Phone Number: 05/12/2019, 11:10 AM  Clinical Narrative:     11:10am- [facility] - declined patient  11:05am- called/ Left voice message for [facility] admissions coordinator. 11:03am - called / left voice message for [facility] admissions coordinator.   Thurmond Butts, MSW, [facility] Clinical Social Worker [phone]    Expected Discharge Plan: Skilled Nursing Facility Barriers to Discharge: Continued Medical Work up  Expected Discharge Plan and Services Expected Discharge Plan: [place] Choice: [place] arrangements for the past 2 months: Single Family Home                                       Social Determinants of Health (SDOH) Interventions    Readmission Risk Interventions No flowsheet data found.

## 2019-05-12 NOTE — Progress Notes (Signed)
Patient refused bilateral leg dressing changes today.   Emelda Fear, RN

## 2019-05-13 DIAGNOSIS — F4325 Adjustment disorder with mixed disturbance of emotions and conduct: Secondary | ICD-10-CM | POA: Diagnosis not present

## 2019-05-13 DIAGNOSIS — L03116 Cellulitis of left lower limb: Secondary | ICD-10-CM | POA: Diagnosis not present

## 2019-05-13 DIAGNOSIS — Z609 Problem related to social environment, unspecified: Secondary | ICD-10-CM | POA: Diagnosis not present

## 2019-05-13 DIAGNOSIS — N3000 Acute cystitis without hematuria: Secondary | ICD-10-CM | POA: Diagnosis not present

## 2019-05-13 DIAGNOSIS — I83012 Varicose veins of right lower extremity with ulcer of calf: Secondary | ICD-10-CM | POA: Diagnosis not present

## 2019-05-13 LAB — CBC
HCT: 27.2 % — ABNORMAL LOW (ref 36.0–46.0)
Hemoglobin: 8.2 g/dL — ABNORMAL LOW (ref 12.0–15.0)
MCH: 28.2 pg (ref 26.0–34.0)
MCHC: 30.1 g/dL (ref 30.0–36.0)
MCV: 93.5 fL (ref 80.0–100.0)
Platelets: 269 10*3/uL (ref 150–400)
RBC: 2.91 MIL/uL — ABNORMAL LOW (ref 3.87–5.11)
RDW: 15 % (ref 11.5–15.5)
WBC: 5.1 10*3/uL (ref 4.0–10.5)
nRBC: 0 % (ref 0.0–0.2)

## 2019-05-13 LAB — BASIC METABOLIC PANEL
Anion gap: 11 (ref 5–15)
BUN: 21 mg/dL (ref 8–23)
CO2: 29 mmol/L (ref 22–32)
Calcium: 8.8 mg/dL — ABNORMAL LOW (ref 8.9–10.3)
Chloride: 97 mmol/L — ABNORMAL LOW (ref 98–111)
Creatinine, Ser: 0.97 mg/dL (ref 0.44–1.00)
GFR calc Af Amer: >60 mL/min (ref >60)
GFR calc non Af Amer: >60 mL/min (ref >60)
Glucose, Bld: 92 mg/dL (ref 70–99)
Potassium: 4.3 mmol/L (ref 3.5–5.1)
Sodium: 137 mmol/L (ref 135–145)

## 2019-05-13 NOTE — Progress Notes (Signed)
Patient refused bandage changes on bilateral lower extremities .  Patient states Dakins and all that "other stuff stings and we ain't doing that"  Patient requests alternative treatments.  [place] nurse consult placed.

## 2019-05-13 NOTE — TOC Progression Note (Signed)
Transition of Care [facility]) - Progression Note    Patient Details  Name: [name] MRN: [id number] Date of Birth: Apr 24, 1951  Transition of Care [facility]) CM/SW [place], [place] Phone Number: 05/13/2019, 4:33 PM  Clinical Narrative:     Patient;s level II PSARR remains pending- CSW contacted PSARR reviewer, Gershon Mussel, he reports the patient should have PSARR by tomorrow.   Patient states she is willing to go to SNF and stay requested minium of 30 days needed for Medicaid patient.    CSW visit with the patient at bedside. Patient was alert and oriented. Patient was engaged and sometime tearful about her situation. Patient expressed she was fearful of trying to walk because she was afraid of falling, even with PT assistance. CSW validated the patient's feelings but also encouraged the patient to try and work with PT. Patient states she was going to try. Patient recognized the need for rehab.   CSW discussed  SNF placement outside of the [place] area. At first the patient was reluctant but agreed to [place] searching for ST rehab in surrounding counties. CSW contacted [facility]- they are willing to review and consider for possible placement-however, they do anticipate any availability until the middle or end of next week. CSW discussed this option and remains agreeable to SNF.   CSW will continue to follow and assist with discharge planning.      Thurmond Butts, MSW, [facility] Clinical Social Worker [phone]     Expected Discharge Plan: Skilled Nursing Facility Barriers to Discharge: Continued Medical Work up  Expected Discharge Plan and Services Expected Discharge Plan: [place] Choice: [place] arrangements for the past 2 months: Single Family Home                                       Social Determinants of Health (SDOH) Interventions    Readmission Risk Interventions No flowsheet  data found.

## 2019-05-13 NOTE — Progress Notes (Signed)
Occupational Therapy Treatment Patient Details Name: [name] MRN: [id number] DOB: [birth year] Today's Date: 05/13/2019    History of present illness Patient is a 68 y/o female who presents with bilateral chronic venous stasis that have failed to improve 2/2 to noncompliance. PMHx:Bipolar I disorder with mania, afib   OT comments  Pt sitting in bed upon arrival, reluctantly agreeable to OT session. Session limited due to pt's behavior, pt very particular about sequence and assist level during session. Pt agitated when educated on purpose of gait belt. Pt progressed to EOB with increased time and effort. Pt attempted sit<>stand with support from therapist and therapy tech, unable to get buttocks off bed. Pt with inappropriate communicated 3x during session, redirected pt 2x and communicated that the session would be terminated if type of communicated continued. Session terminated on 3rd occurrence of inappropriate communication. Pt will continue to benefit from skilled OT services to maximize safety and independence with ADL/IADL and functional mobility. Will continue to follow acutely and progress as tolerated.    Follow Up Recommendations  SNF;Supervision/Assistance - 24 hour    Equipment Recommendations  3 in 1 bedside commode;Tub/shower bench;None recommended by OT    Recommendations for Other Services      Precautions / Restrictions Precautions Precautions: Fall Precaution Comments: wounds distal BLEs Restrictions Weight Bearing Restrictions: No       Mobility Bed Mobility Overal bed mobility: Modified Independent Bed Mobility: Supine to Sit;Sit to Supine     Supine to sit: HOB elevated Sit to supine: HOB elevated      Transfers Overall transfer level: Needs assistance Equipment used: 2 person hand held assist Transfers: Sit to/from Stand           General transfer comment: attempted to stand from EOB with OT and therapy tech;pt not cooperative with assistance  becoming agitated and inappropriate communication with therapist and tech despite attempts to redirect poor behavior, terminated session    Balance Overall balance assessment: Needs assistance Sitting-balance support: No upper extremity supported;Feet supported Sitting balance-Leahy Scale: Fair         Standing balance comment: unable to come into standing;not cooperative with appropriate assistance from therapist and tech, deferred mobiltiy                           ADL either performed or assessed with clinical judgement   ADL Overall ADL's : Needs assistance/impaired Eating/Feeding: Set up;Bed level               Upper Body Dressing : Moderate assistance;Sitting   Lower Body Dressing: Total assistance;Sit to/from stand   Toilet Transfer: Total assistance Toilet Transfer Details (indicate cue type and reason): pt attempted sit<>stand unable to get buttocks off bed           General ADL Comments: limited by pain, cognition, and behavior;     Vision       Perception     Praxis      Cognition Arousal/Alertness: Awake/alert Behavior During Therapy: Anxious;Agitated Overall Cognitive Status: History of cognitive impairments - at baseline Area of Impairment: Safety/judgement;Memory;Problem solving                     Memory: Decreased short-term memory Following Commands: Follows one step commands inconsistently;Follows one step commands with increased time Safety/Judgement: Decreased awareness of safety   Problem Solving: Slow processing;Decreased initiation;Requires verbal cues;Requires tactile cues General Comments: Pt with rude comments during session, when  communicating with her importance of gait belt, assist from therapist, and importance of mobiltiy, redirected pt 3x with unresolved behavior leading to ending session early. Pt very particular about sequence of events and interactions between therapist and pt;making jokes and laughing at  one moment and snaping at therapist with rude comments the next, unpredicatble behavior        Exercises     Shoulder Instructions       General Comments      Pertinent Vitals/ Pain       Pain Assessment: Faces Faces Pain Scale: Hurts little more Pain Location: LLE Pain Descriptors / Indicators: Burning Pain Intervention(s): Limited activity within patient's tolerance;Monitored during session  Home Living                                          Prior Functioning/Environment              Frequency  Min 2X/week        Progress Toward Goals  OT Goals(current goals can now be found in the care plan section)  Progress towards OT goals: Progressing toward goals(due to behavior )  Acute Rehab OT Goals Patient Stated Goal: "do things myself" OT Goal Formulation: With patient Time For Goal Achievement: 05/18/19 Potential to Achieve Goals: Fair ADL Goals Pt Will Perform Upper Body Bathing: with set-up;with supervision;sitting Pt Will Perform Lower Body Bathing: with adaptive equipment;sit to/from stand;with min assist Pt Will Perform Upper Body Dressing: with set-up;with supervision;sitting Pt Will Transfer to Toilet: with min assist;stand pivot transfer;bedside commode Pt Will Perform Toileting - Clothing Manipulation and hygiene: with min assist;sit to/from stand  Plan Discharge plan remains appropriate    Co-evaluation                 AM-PAC OT "6 Clicks" Daily Activity     Outcome Measure   Help from another person eating meals?: None Help from another person taking care of personal grooming?: A Little Help from another person toileting, which includes using toliet, bedpan, or urinal?: Total Help from another person bathing (including washing, rinsing, drying)?: A Lot Help from another person to put on and taking off regular upper body clothing?: A Lot Help from another person to put on and taking off regular lower body clothing?:  Total 6 Click Score: 13    End of Session    OT Visit Diagnosis: Other abnormalities of gait and mobility (R26.89);Pain Pain - Right/Left: Left Pain - part of body: Leg   Activity Tolerance Patient limited by pain;Treatment limited secondary to agitation   Patient Left in bed;with call bell/phone within reach;with bed alarm set   Nurse Communication Mobility status        Time: [id number] OT Time Calculation (min): 19 min  Charges: OT General Charges $OT Visit: 1 Visit OT Treatments $Self Care/Home Management : 8-22 mins  Caroline Brownereresa Othniel Maret OTR/L Acute Rehabilitation Services Office: [phone]    Rebeca Alerteresa J Kristin Lamagna 05/13/2019, 4:34 PM

## 2019-05-13 NOTE — Progress Notes (Signed)
Family Medicine Teaching Service Daily Progress Note Intern Pager: [phone]  Patient name: [name] Medical record number: [id number] Date of birth: 15-Jul-1951 Age: 68 y.o. Gender: female  Primary Care Provider: Cleophas Dunker, DO Consultants: none Code Status: Full  Pt Overview and Major Events to Date:  9/7-admitted, IV cefepime and vanc started 9/8-UTI E. Coli 9/9- IV abx d/c, PO keflex started  Assessment and Plan: [name] is a 68 y.o. female presenting with lower extremity pain secondary to chronic venous stasis ulcers. PMH is significant for atrial fibrillation, anemia and bipolar disorder.   Chronic Venous Stasis Ulcers Patient remains medically stable and is appropriate for discharge.  Patient with difficult disposition.  She is resistant to relinquishing her check in order to attend SNF.  She has been discharged from multiple home health agencies for being verbally inappropriate with staff.  Case management continue to work on safe disposition.  On exam, bilateral wounds are wrapped with no visible erythema and dressings are without discharge.  Patient denies current leg pain reports worsens with dressing changes.  PT do not recommend any immobilization devices and safest disposition continues to be SNF.  Case management continues to work on potential bed placement. - continue keflex 500 mg q6h, day 10/10, ends today - PT/OT  -Case management, regarding disposition  - elevate LE's  -Wean tramadol as tolerated  UTI, Ecoli   S/p IV cefepime (9/7-9/9) - contnue keflex 500 mg q6h po end today  Personality Disorder  Psych consulted for concerns of bipolar and they believe her behavior is 2/2 to personality disorder. Psychiatry signed off. No meds as pt declines.   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  Continue supportive care.  - Tramadol 50mg   q6 PRN, wean as tolerated - Continue gabapentin 400mg  TID  - continue Duloxetine 30 mg PO qd for neuropathic pain  Paroxysmal A fib - Pt declining anticoagulation.   Anemia: stable - continue ferrous sulfate   History of cocaine use AKI, Resolved    FEN/GI: Heart healthy diet, MiraLAX PPx: Heparin  Disposition: Stable likely home as pt declines SNF  Subjective:  Patient denies pain and there were no overnight significant events.   Objective: Temp:  [98.6 F (37 C)-98.8 F (37.1 C)] 98.8 F (37.1 C) (09/16 0348) Pulse Rate:  [82-113] 82 (09/16 0348) Resp:  [14-18] 18 (09/16 0348) BP: (105-119)/(49-56) 105/49 (09/16 0348) SpO2:  [92 %-97 %] 92 % (09/16 0348)  Physical Exam:  GEN: Pleasant this morning, elderly female working on crossword puzzle  CV: Irregularly irregular rhythm and normal rate, no murmurs appreciated  RESP: no increased work of breathing, clear to ascultation bilaterally with no crackles, wheezes, or rhonchi  ABD: Soft, Nontender, Nondistended.  MSK: no edema, or cyanosis noted SKIN: warm, dry, bilateral lower extremity venous stasis ulcers wrapped in dressing that is clean and dry NEURO: grossly normal, moves all extremities appropriately    Laboratory: Recent Labs  Lab 05/07/19 0258  WBC 5.9  HGB 8.8*  HCT 29.0*  PLT 292   Recent Labs  Lab 05/07/19 0258  NA 135  K 4.1  CL 104  CO2 23  BUN 24*  CREATININE 1.14*  CALCIUM 8.5*  GLUCOSE 104*    Imaging/Diagnostic Tests: No results found.  Caroline Hensen, MD 05/13/2019, 8:03 AM PGY-1, [place] Intern pager: [phone], text pages welcome

## 2019-05-14 DIAGNOSIS — N3000 Acute cystitis without hematuria: Secondary | ICD-10-CM | POA: Diagnosis not present

## 2019-05-14 DIAGNOSIS — F4325 Adjustment disorder with mixed disturbance of emotions and conduct: Secondary | ICD-10-CM | POA: Diagnosis not present

## 2019-05-14 DIAGNOSIS — L03116 Cellulitis of left lower limb: Secondary | ICD-10-CM | POA: Diagnosis not present

## 2019-05-14 NOTE — Progress Notes (Signed)
Physical Therapy Treatment Patient Details Name: [name] MRN: [id number] DOB: 1950-12-17 Today's Date: 05/14/2019    History of Present Illness Patient is a 69 y/o female who presents with bilateral chronic venous stasis that have failed to improve 2/2 to noncompliance. PMHx:Bipolar I disorder with mania, afib    PT Comments    Pt remains to present with poor attitude towards mobility.  She continues to self limit and reports she will be fine mobilizing at home.  Pt was agreeable to supine LE therapeutic exercises with cues for technique.  Plan for PT follow up to progress mobility as she is willing.  Based on presentation she continues to require SNF placement to improve strength and function before returning home.    Follow Up Recommendations  SNF;Supervision for mobility/OOB;Supervision/Assistance - 24 hour     Equipment Recommendations  Wheelchair (measurements PT);Wheelchair cushion (measurements PT)    Recommendations for Other Services       Precautions / Restrictions Precautions Precautions: Fall Precaution Comments: wounds distal BLEs Restrictions Weight Bearing Restrictions: No    Mobility  Bed Mobility               General bed mobility comments: Pt performed repositioning in bed with VCs for sequencing. She refused OOB despite sheets being soiled from leaking purewick and weeping wounds on her B LEs.  Transfers                    Ambulation/Gait                 Stairs             Wheelchair Mobility    Modified Rankin (Stroke Patients Only)       Balance                                            Cognition Arousal/Alertness: Awake/alert Behavior During Therapy: Anxious;Agitated Overall Cognitive Status: History of cognitive impairments - at baseline Area of Impairment: Safety/judgement;Memory;Problem solving                     Memory: Decreased short-term memory Following Commands:  Follows one step commands inconsistently;Follows one step commands with increased time Safety/Judgement: Decreased awareness of deficits   Problem Solving: Requires verbal cues General Comments: Pt continues to decline OOB mobility she remains vulgar in exhanges and would just as well be left alone.  PTA provided max encouragement and education to participate in session this pm.  She reports, " I can't wait till I go home and all the f$$$$$$ will leave me alone."      Exercises General Exercises - Lower Extremity Ankle Circles/Pumps: AROM;Both;Supine;20 reps Quad Sets: AROM;Both;10 reps;Supine Heel Slides: AROM;Both;10 reps;Supine Hip ABduction/ADduction: AROM;Both;10 reps;Supine Straight Leg Raises: AROM;Both;10 reps;Supine    General Comments        Pertinent Vitals/Pain Pain Assessment: Faces Faces Pain Scale: Hurts little more Pain Location: B hips and LEs Pain Descriptors / Indicators: Burning Pain Intervention(s): Monitored during session;Repositioned    Home Living                      Prior Function            PT Goals (current goals can now be found in the care plan section) Acute Rehab PT Goals Patient Stated Goal: "do things  myself" Potential to Achieve Goals: Fair Progress towards PT goals: Progressing toward goals    Frequency    Min 2X/week      PT Plan Current plan remains appropriate    Co-evaluation              AM-PAC PT "6 Clicks" Mobility   Outcome Measure  Help needed turning from your back to your side while in a flat bed without using bedrails?: A Little Help needed moving from lying on your back to sitting on the side of a flat bed without using bedrails?: A Little Help needed moving to and from a bed to a chair (including a wheelchair)?: A Lot Help needed standing up from a chair using your arms (e.g., wheelchair or bedside chair)?: A Lot Help needed to walk in hospital room?: Total Help needed climbing 3-5 steps with a  railing? : Total 6 Click Score: 12    End of Session Equipment Utilized During Treatment: Gait belt Activity Tolerance: Other (comment);Patient tolerated treatment well(self limiting behavior)   Nurse Communication: Mobility status(informed nurse tech bed was soiled.) PT Visit Diagnosis: Unsteadiness on feet (R26.81);Muscle weakness (generalized) (M62.81);Other abnormalities of gait and mobility (R26.89) Pain - Right/Left: (BIL) Pain - part of body: Leg     Time: 1325-1340 PT Time Calculation (min) (ACUTE ONLY): 15 min  Charges:  $Therapeutic Exercise: 8-22 mins                     [name], PTA Acute Rehabilitation Services Pager [phone] Office [phone]     [name] Artis DelayJ Davi Rotan 05/14/2019, 2:46 PM

## 2019-05-14 NOTE — Progress Notes (Signed)
Pt refuses dressing changes to legs stating she will only allow dressing changes if treatment consists of Vaseline guaze and kerlix with Nurse Kellie Shropshire to do dressing changes.  MD aware & requests to be paged when patient allows dressing to obtain photos. Pt noted crawling at something on the floor then said "I said I'm hurrying up". Nurse asked patient who she was talking to and she did not answer and began drinking her coffee.   Will continue to monitor.

## 2019-05-14 NOTE — Consult Note (Signed)
[place] Nurse wound follow up Patient receiving care in [place]. This modification in BLE wound care instructions occurred after I spoke by telephone with the patient's primary RN, Sharyn Lull. According to the information I learned from [place], the patient is adamantly refusing any and all care to the BLE except for the application of vaseline gauze then wrapping with kerlex.  I also understand from speaking with Sharyn Lull, that the patient may be hallucinating intermittently, including this morning.  I have requested Sharyn Lull ensure the MD is aware of the described circumstances, and to place a progress note in the chart about the situation. Monitor the wound area(s) for worsening of condition such as: Signs/symptoms of infection,  Increase in size,  Development of or worsening of odor, Development of pain, or increased pain at the affected locations.  Notify the medical team if any of these develop.  Thank you for the consult.  Discussed plan of care with the bedside nurse.  [place] nurse will not follow at this time.  Please re-consult the [place] team if needed.  Val Riles, RN, MSN, CWOCN, CNS-BC, pager [phone]

## 2019-05-14 NOTE — Progress Notes (Signed)
Family Medicine Teaching Service Daily Progress Note Intern Pager: [phone]  Patient name: [name] Medical record number: [id number] Date of birth: 03/10/1951 Age: 68 y.o. Gender: female  Primary Care Provider: Cleophas Dunker, DO Consultants: none Code Status: Full  Pt Overview and Major Events to Date:  9/7-admitted, IV cefepime and vanc started 9/8-UTI E. Coli 9/9- IV abx d/c, PO keflex started 9/16: Keflex course ended   Assessment and Plan: [name] is a 68 y.o. female presenting with lower extremity pain secondary to chronic venous stasis ulcers. PMH is significant for atrial fibrillation, anemia and bipolar disorder.   Chronic Venous Stasis Ulcers Patient remains medically stable and is appropriate for discharge.  Patient with difficult disposition.  She is resistant to relinquishing her check in order to attend SNF however states she can not go home. She has been discharged from multiple home health agencies for being verbally abusive with staff.  Case management continue to work on safe disposition.  Patient reports her ulcers are healing well patient denies current leg pain reports worsens with dressing changes.  PT do not recommend any immobilization devices and safest disposition continues to be SNF.  Case management continues to work on potential bed placement. -Completed Keflex course - PT/OT recommend SNF -Case management, appreciate recommendations -Social work, patient recommendations -Wean tramadol as tolerated  UTI, Ecoli   Resolved.  Denies dysuria.  -Completed Keflex course  Personality Disorder  Psych consulted for concerns of bipolar and they believe her behavior is 2/2 to personality disorder. Psychiatry signed off. No meds as pt declines.   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  Continue supportive care.  - Tramadol 50mg  q6  PRN, wean as tolerated - Continue gabapentin 400mg  TID  - continue Duloxetine 30 mg PO qd for neuropathic pain  Paroxysmal atrial fibrillation - Pt declining anticoagulation.   Anemia: stable Patient had refused to have colonoscopy in 2019.  Will ask patient if she is willing to get a colonoscopy.  Normocytic anemia;hemoglobin 8.2 and MCV 93.5.   - continue ferrous sulfate  -Follow-up colonoscopy  History of cocaine use AKI, Resolved    FEN/GI: Heart healthy diet, MiraLAX PPx: Heparin  Disposition: likely SNF pending placement  Subjective:  Patient denies pain currently.  Recently had her bandages changes on her legs and reports that they are healing well.  States that she is ready to leave the hospital.  Objective: Temp:  [97.9 F (36.6 C)-98.6 F (37 C)] 97.9 F (36.6 C) (09/17 0429) Pulse Rate:  [55] 55 (09/17 0429) Resp:  [14] 14 (09/17 0429) BP: (98-104)/(58) 104/58 (09/17 0429) SpO2:  [95 %-96 %] 96 % (09/17 0429)   Physical Exam:  GEN: Working with crossword puzzle in the bed, in no acute distress CV: Irregularly irregular rhythm and regular rate, no murmur appreciated RESP: no increased work of breathing, clear to ascultation bilaterally with no crackles, wheezes, or rhonchi  ABD: Bowel sounds present. Soft, Nontender, Nondistended.  MSK: no edema, or cyanosis noted SKIN: Bilateral venous stasis ulcers are wrapped, healing well per nursing NEURO: grossly normal, no abnormal coordination    Laboratory: Recent Labs  Lab 05/13/19 0723  WBC 5.1  HGB 8.2*  HCT 27.2*  PLT 269   Recent Labs  Lab 05/13/19 0723  NA 137  K 4.3  CL 97*  CO2 29  BUN 21  CREATININE 0.97  CALCIUM 8.8*  GLUCOSE 92    Imaging/Diagnostic Tests: No results found.  Katha CabalBrimage, Velicia Dejager, MD 05/14/2019, 7:32 AM  PGY-1, [facility] FPTS Intern pager: [phone], text pages welcome

## 2019-05-14 NOTE — Consult Note (Signed)
[facility] Nurse wound consult note Patient receiving care in [facility].  Consult completed remotely after review of record. Reason for Consult: Patient refusing current [facility]'s order for BLE wounds Wound type: venous stasis Dressing procedure/placement/frequency: Wash BLE with soap and water.  Pat dry. Apply Hydrogel Kellie Simmering [phone]) to all dried, crusting areas, and any areas with yellow or brown slough, or eschar.  Cover with Telfa pads. Wrap in kerlex. Top with Ace Wrap bandages. Monitor the wound area(s) for worsening of condition such as: Signs/symptoms of infection,  Increase in size,  Development of or worsening of odor, Development of pain, or increased pain at the affected locations.  Notify the medical team if any of these develop.  Thank you for the consult. Please re-consult the [place] team if needed.  Val Riles, RN, MSN, CWOCN, CNS-BC, pager [phone]

## 2019-05-15 DIAGNOSIS — F4325 Adjustment disorder with mixed disturbance of emotions and conduct: Secondary | ICD-10-CM | POA: Diagnosis not present

## 2019-05-15 NOTE — Consult Note (Signed)
[place] Nurse wound follow up Patient receiving care in [facility] 5M03.   Wound type: Venous stasis Measurement: deferred; patient refused Wound bed: all are pink, see photos from today placed in record by [facility] Drainage (amount, consistency, odor) serous Periwound: scarred, dry, flaking, chronic changes Dressing procedure/placement/frequency: Patient refused to allow me to unwrap and see the LLE. She agreed to my viewing the RLE.  The wounds appear to have cleaned up. She is adamant she will only allow vaseline gauze and kerlex every 2 days, not every day.  Also, she refused Prevalon heel lift boots. I modified the order to reflect to change the dressings every 2 days and communicated the results of my encounter to [facility] via SecureChat. Monitor the wound area(s) for worsening of condition such as: Signs/symptoms of infection,  Increase in size,  Development of or worsening of odor, Development of pain, or increased pain at the affected locations.  Notify the medical team if any of these develop.  Thank you for the consult.  Discussed plan of care with the patient.  [place] nurse will not follow at this time.  Please re-consult the [place] team if needed.  Val Riles, RN, MSN, CWOCN, CNS-BC, pager [phone]

## 2019-05-15 NOTE — Plan of Care (Signed)
  Problem: Health Behavior/Discharge Planning: Goal: Ability to manage health-related needs will improve Outcome: Progressing   

## 2019-05-15 NOTE — Plan of Care (Signed)
  Problem: Education: Goal: Knowledge of General Education information will improve Description Including pain rating scale, medication(s)/side effects and non-pharmacologic comfort measures Outcome: Progressing   

## 2019-05-15 NOTE — Progress Notes (Signed)
Family Medicine Teaching Service Daily Progress Note Intern Pager: [phone]  Patient name: [name] Medical record number: [id number] Date of birth: October 12, 1950 Age: 68 y.o. Gender: female  Primary Care Provider: Cleophas Dunker, DO Consultants: none Code Status: Full  Pt Overview and Major Events to Date:  9/7-admitted, IV cefepime and vanc started 9/8-UTI E. Coli 9/9- IV abx d/c, PO keflex started 9/16: Keflex course ended   Assessment and Plan:[name] is a 68 y.o. female who presented with lower extremity pain secondary to chronic venous stasis ulcers. PMH is significant for atrial fibrillation, anemia and bipolar disorder.   Chronic Venous Stasis Ulcers Left venous stasis ulcer is healing very well in the right is healing appropriately.  Patient refuses to have her dressings changed daily refers that certain staff members change her dressing.  I have contacted the wound nurse who saw patient today and will update wound care recommendations.  Patient with difficult disposition however remains medically stable and appropriate for discharge.  Awaiting patient acceptance to SNF. Case management continues to work on potential bed placement. -Completed Keflex course - PT/OT recommend SNF -Case management, appreciate recommendations -Social work, appreciate recommendations -Wean tramadol as tolerated  UTI, Ecoli   Resolved.  Denies dysuria.  -Completed Keflex course  Personality Disorder  Psych consulted for concerns of bipolar and they believe her behavior is 2/2 to personality disorder. Psychiatry signed off. No meds as pt declines.   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  Continue supportive care.  - Tramadol 50mg  q6 PRN, wean as tolerated - Continue gabapentin 400mg  TID  - continue Duloxetine 30 mg PO qd for neuropathic pain  Paroxysmal atrial  fibrillation - Pt declining anticoagulation.   Anemia: stable Per chart review, patient refused to have colonoscopy in February 2019 however patient states that she had a colonoscopy last year. Normocytic anemia;hemoglobin 8.2 and MCV 93.5.   - continue ferrous sulfate   History of cocaine use AKI, Resolved    FEN/GI: Heart healthy diet, MiraLAX PPx: Heparin  Disposition: likely SNF pending placement  Subjective:  Patient with no complaints this morning. There were no significant events overnight.  Objective: Temp:  [98 F (36.7 C)-99.4 F (37.4 C)] 99.2 F (37.3 C) (09/18 0508) Pulse Rate:  [60-68] 60 (09/18 0508) Resp:  [16-18] 18 (09/18 0508) BP: (99-107)/(45-57) 103/57 (09/18 0508) SpO2:  [93 %-100 %] 100 % (09/18 0508)   Physical Exam:  GEN: Elderly female, upright in bed no acute distress  CV: Irregularly irregular rhythm and regular rate, no murmurs appreciated  RESP: no increased work of breathing, clear to ascultation bilaterally  ABD: Soft, nontender, nondistended, bowel sounds present MSK: no edema, or cyanosis noted SKIN: See pictures below NEURO: grossly normal, moves all extremities appropriately    Right lower extremity        Left lower extermity   Laboratory: Recent Labs  Lab 05/13/19 0723  WBC 5.1  HGB 8.2*  HCT 27.2*  PLT 269   Recent Labs  Lab 05/13/19 0723  NA 137  K 4.3  CL 97*  CO2 29  BUN 21  CREATININE 0.97  CALCIUM 8.8*  GLUCOSE 92    Imaging/Diagnostic Tests: No results found.  Lyndee Hensen, MD 05/15/2019, 7:02 AM  PGY-1, [place] Intern pager: [phone], text pages welcome

## 2019-05-16 NOTE — Progress Notes (Signed)
Family Medicine Teaching Service Daily Progress Note Intern Pager: [phone]  Patient name: [name] Medical record number: [id number] Date of birth: 05/26/1951 Age: 68 y.o. Gender: female  Primary Care Provider: Cleophas Dunker, DO Consultants: none Code Status: Full  Pt Overview and Major Events to Date:  9/7-admitted, IV cefepime and vanc started 9/8-UTI E. Coli 9/9- IV abx d/c, PO keflex started 9/16: Keflex course ended   Assessment and Plan:[name] is a 68 y.o. female who presented with lower extremity pain secondary to chronic venous stasis ulcers. PMH is significant for atrial fibrillation, anemia and bipolar disorder.   Chronic Venous Stasis Ulcers Left venous stasis ulcer is healing very well in the right is healing appropriately.  Patient refuses to have her dressings changed daily refers that certain staff members change her dressing. Patient with difficult disposition however remains medically stable and appropriate for discharge.  Awaiting patient acceptance to SNF. Case management continues to work on potential bed placement. -Completed Keflex course - PT/OT recommend SNF -Case management, appreciate recommendations -Social work, appreciate recommendations -Wean tramadol as tolerated  UTI, Ecoli   Resolved.  Denies dysuria.  -Completed Keflex course  Personality Disorder  Psych consulted for concerns of bipolar and they believe her behavior is 2/2 to personality disorder. Psychiatry signed off. No meds as pt declines.   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  Continue supportive care.  - Tramadol 50mg  q6 PRN, wean as tolerated - Continue gabapentin 400mg  TID  - continue Duloxetine 30 mg PO qd for neuropathic pain  Paroxysmal atrial fibrillation - Pt declining anticoagulation.   Anemia: stable Per chart review, patient refused to  have colonoscopy in February 2019 however patient states that she had a colonoscopy last year. Normocytic anemia;hemoglobin 8.2 and MCV 93.5.   - continue ferrous sulfate   History of cocaine use   AKI, Resolved    FEN/GI: Heart healthy diet, MiraLAX PPx: Heparin  Disposition: likely SNF pending placement  Subjective:  Patient with no complaints this morning. There were no significant events overnight.  Objective: Temp:  [98.7 F (37.1 C)-99.5 F (37.5 C)] 98.7 F (37.1 C) (09/19 0359) Pulse Rate:  [68-84] 75 (09/19 0359) Resp:  [18-20] 18 (09/19 0359) BP: (91-106)/(51-58) 100/52 (09/19 0359) SpO2:  [94 %-96 %] 94 % (09/19 0359) Weight:  [80.5 kg] 80.5 kg (09/19 0359)   Physical Exam:  GEN: Elderly female, sleeping comfortably in bed, no acute distress  CV: Irregularly irregular rhythm and regular rate, no murmurs appreciated  RESP: no increased work of breathing, clear to ascultation bilaterally  ABD: Soft, nontender, nondistended, bowel sounds present MSK: no edema, or cyanosis noted SKIN: See pictures below NEURO: grossly normal, moves all extremities appropriately  Laboratory: Recent Labs  Lab 05/13/19 0723  WBC 5.1  HGB 8.2*  HCT 27.2*  PLT 269   Recent Labs  Lab 05/13/19 0723  NA 137  K 4.3  CL 97*  CO2 29  BUN 21  CREATININE 0.97  CALCIUM 8.8*  GLUCOSE 92    Imaging/Diagnostic Tests: No results found.  Caroline Damme, MD 05/16/2019, 6:19 AM  PGY-1, [place] Intern pager: [phone], text pages welcome

## 2019-05-17 DIAGNOSIS — L98499 Non-pressure chronic ulcer of skin of other sites with unspecified severity: Secondary | ICD-10-CM | POA: Diagnosis not present

## 2019-05-17 DIAGNOSIS — L03116 Cellulitis of left lower limb: Secondary | ICD-10-CM | POA: Diagnosis not present

## 2019-05-17 DIAGNOSIS — N3000 Acute cystitis without hematuria: Secondary | ICD-10-CM | POA: Diagnosis not present

## 2019-05-17 DIAGNOSIS — L089 Local infection of the skin and subcutaneous tissue, unspecified: Secondary | ICD-10-CM | POA: Diagnosis not present

## 2019-05-17 LAB — C DIFFICILE QUICK SCREEN W PCR REFLEX
C Diff antigen: POSITIVE — AB
C Diff interpretation: DETECTED
C Diff toxin: POSITIVE — AB

## 2019-05-17 MED ORDER — TRAMADOL HCL 50 MG PO TABS: 50.0000 mg | ORAL_TABLET | Freq: Two times a day (BID) | ORAL | Status: DC | PRN

## 2019-05-17 MED ORDER — VANCOMYCIN 50 MG/ML ORAL SOLUTION
125.0000 mg | Freq: Four times a day (QID) | ORAL | Status: DC
  Administered 2019-05-17 – 2019-05-22 (×21): 125 mg via ORAL
  Filled 2019-05-17 (×24): qty 2.5

## 2019-05-17 NOTE — Progress Notes (Signed)
Family Medicine Teaching Service Daily Progress Note Intern Pager: [phone]  Patient name: [name] Medical record number: [id number] Date of birth: February 06, 1951 Age: 68 y.o. Gender: female  Primary Care Provider: Cleophas Dunker, DO Consultants: Psych, Wound care Code Status: Full   Pt Overview and Major Events to Date:  9/7-admitted, IV cefepime and vanc started 9/8-UTI E. Coli 9/9- IV abx d/c, PO keflex started 9/16: Keflex course ended  9/18: medically stable for SNF placement  Assessment and Plan: [name] is a 68 y.o. female whopresented with lower extremity pain secondary to chronic venous stasis ulcers. PMH is significant foratrial fibrillation, anemiaand bipolar disorder.  Chronic Venous Stasis Ulcers Improved healing with wound care. S/p cefepime, vanc, Keflex. Patient refuses to have her dressings changed daily, per wound care note from 9/18 patient will only have dressing changed every 2 days. Currently medically stable, awaiting patient acceptance to SNF. Case management continues to work on potential bed placement. Today requesting prevalon boots.  - PT/OT recommend SNF - Case management, appreciate recommendations - Social work, appreciate recommendations - wean Tramadol 50mg  to q12h, no tramadol used on 9/19. Can consider dc if no use on 9/20 as well  - Prevalon boots   Watery stools Patient reports continued watery stools. States she has yet to have formed BM. On BID miralax - dc miralax  - C diff PCR ordered given recent abx use - enteric precautions  - if c diff negative can consider imodium  UTI, Ecoli   Resolved. S/p Keflex  Personality Disorder  Psych consulted for concerns of bipolar and they believe her behavior is 2/2 to personality disorder, such as borderline personality disorder.  .  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  Continue supportive care.  - Tramadol 50mg  q6 PRN, wean as tolerated - Continue gabapentin 400mg  TID - continue Duloxetine 30 mg PO qd for neuropathic pain  Hypotension BP 102/61. Appears to have soft Bps at home per chart review of clinic visit vitals.  - continue to monitor   Paroxysmal atrial fibrillation - Pt declining anticoagulation.   Anemia: stable Per chart review, patient refused to have colonoscopy in February 2019 however patient states that she had a colonoscopy last year. Normocytic anemia;hemoglobin 8.2 and MCV 93.5.   - continue ferrous sulfate   History of cocaine use  - recommend cessation   AKI, Resolved  Cr 0.97  - continue to monitor  FEN/GI: Heart healthy diet, MiraLAX PPx: Heparin  Disposition: SNF when bed available   Subjective:  Patient states she overall feels well. Reports heel pain and amenable to Prevalon boots. Patient reports she has continued diarrhea and reports all her BM have been watery.   Objective: Temp:  [98.7 F (37.1 C)-98.8 F (37.1 C)] 98.8 F (37.1 C) (09/19 1940) Pulse Rate:  [57-80] 68 (09/19 1940) Resp:  [16-20] 16 (09/19 1940) BP: (100-108)/(52-59) 104/58 (09/19 1940) SpO2:  [94 %-97 %] 97 % (09/19 1940) Weight:  [80.5 kg] 80.5 kg (09/19 0359) Physical Exam: General: asleep but easily arousable, NAD Cardiovascular: RRR, no MRG  Respiratory: CTAB, no wheezes, rales or rhonchi  Abdomen: soft, non tender, non distended, bowel sounds present Extremities: legs wrapped bilaterally (requested me not to undress them as wound care would be coming today). 2+ pedal pulses bilaterally  Laboratory: Recent Labs  Lab 05/13/19 0723  WBC 5.1  HGB 8.2*  HCT 27.2*  PLT 269   Recent Labs  Lab 05/13/19 0723  NA 137  K 4.3  CL 97*  CO2 29  BUN 21  CREATININE 0.97  CALCIUM 8.8*  GLUCOSE 92      Imaging/Diagnostic Tests: No new imaging   Oralia ManisAbraham, Venesha Petraitis, DO 05/17/2019, 12:21 AM PGY-3, [place] [facility] FPTS Intern pager: [phone], text pages welcome

## 2019-05-17 NOTE — Progress Notes (Signed)
Family Medicine Teaching Service Daily Progress Note Intern Pager: [phone]  Patient name: [name] Medical record number: [id number] Date of birth: May 08, 1951 Age: 68 y.o. Gender: female  Primary Care Provider: Cleophas Dunker, DO Consultants: Psych, Wound care Code Status: Full   Pt Overview and Major Events to Date:  9/7-admitted, IV cefepime and vanc started 9/8-UTI E. Coli 9/9- IV abx d/c, PO keflex started 9/16: Keflex course ended  9/18: medically stable for SNF placement 9/20: C. Difficile positive   Assessment and Plan: [name] is a 68 y.o. femalepresented with lower extremity pain secondary to chronic venous stasis ulcers. PMH is significant foratrial fibrillation, anemiaand bipolar disorder.   C. Difficile Enteritis  Patient reported multiple watery stools following antibiotics use for venous stasis ulcers. C. Difficile panel positive. Started on 10 day oral vanc course.   -Enteric precautions  -Vancomycin 125 mg PO Day 2/10  Chronic Venous Stasis Ulcers Wounds are healing well. Patient reports pain with wound dressing changes.  Patient completed antibiotic course (Cefepime/Vanc to Keflex). Patient wearing prevalon boots. Awaiting patient acceptance to SNF. Case management continues to work on potential bed placement.  - PT/OT recommend SNF - Case management, appreciate recommendations - Social work, appreciate recommendations - weaned Tramadol 50mg  to q24h,  - Prevalon boots   UTI, Ecoli   Resolved. S/p Keflex  Personality Disorder  Psych consulted for concerns of bipolar and they believe her behavior is 2/2 to personality disorder, such as borderline personality disorder.  .  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  Continue supportive care.   - Tramadol 50mg  q12h PRN, wean as tolerated - Continue gabapentin 400mg  TID - continue Duloxetine 30 mg PO qd for neuropathic pain  Hypotension BP 100/56. Appears to have soft BPs at home per chart review of clinic visit vitals.  - continue to monitor   Paroxysmal atrial fibrillation - Pt declining anticoagulation.   Anemia: stable Per chart review, patient refused to have colonoscopy in February 2019 however patient states that she had a colonoscopy last year. Normocytic anemia;hemoglobin 9.3 and MCV 94.1.   - continue ferrous sulfate   History of cocaine use  - recommend cessation   AKI, Resolved  Cr 1.00 - continue to monitor  FEN/GI: Heart healthy diet, MiraLAX PPx: Heparin  Disposition: SNF when bed available   Subjective:  Patient reports continued diarrhea.  No significant events overnight.  Objective: Temp:  [98.2 F (36.8 C)-98.8 F (37.1 C)] 98.2 F (36.8 C) (09/20 0828) Pulse Rate:  [57-77] 77 (09/20 0828) Resp:  [16-18] 18 (09/20 0828) BP: (102-135)/(54-106) 135/106 (09/20 0828) SpO2:  [94 %-97 %] 95 % (09/20 0828) Weight:  [80.2 kg] 80.2 kg (09/20 0316)  Physical Exam: GEN: Elderly female in no acute distress CV: Irregularly irregular rhythm, regular rate no murmurs appreciated  RESP: no increased work of breathing, clear to ascultation bilaterally with no crackles, wheezes, or rhonchi  ABD: Bowel sounds present. Soft, Nontender in all quadrants, Nondistended.  MSK: no edema, or cyanosis noted SKIN: warm, dry, right lower extremity: progressively healing venous stasis ulcer, left lower extremity: Well-healing venous stasis ulcer NEURO: grossly normal, moves all extremities appropriately     Laboratory: Recent Labs  Lab 05/13/19 0723 05/18/19 0530  WBC 5.1 5.2  HGB 8.2* 9.3*  HCT 27.2* 30.4*  PLT 269 294   Recent Labs  Lab 05/13/19 0723 05/18/19 0530  NA 137 139  K 4.3 3.9  CL 97* 102  CO2 29 26  BUN 21 21  CREATININE 0.97 1.00   CALCIUM 8.8* 9.1  GLUCOSE 92 93     Imaging/Diagnostic Tests: No results found.   Katha CabalBrimage, Caroline Mergen, MD 05/18/2019, 6:16 PM PGY-1, [facility] FPTS Intern pager: [phone], text pages welcome

## 2019-05-17 NOTE — Plan of Care (Signed)
  Problem: Education: Goal: Knowledge of General Education information will improve Description: Including pain rating scale, medication(s)/side effects and non-pharmacologic comfort measures Outcome: Progressing   Problem: Nutrition: Goal: Adequate nutrition will be maintained Outcome: Progressing   

## 2019-05-17 NOTE — Plan of Care (Signed)
  Problem: Education: Goal: Knowledge of General Education information will improve Description Including pain rating scale, medication(s)/side effects and non-pharmacologic comfort measures Outcome: Progressing   

## 2019-05-18 DIAGNOSIS — L03116 Cellulitis of left lower limb: Secondary | ICD-10-CM | POA: Diagnosis not present

## 2019-05-18 DIAGNOSIS — I83012 Varicose veins of right lower extremity with ulcer of calf: Secondary | ICD-10-CM | POA: Diagnosis not present

## 2019-05-18 DIAGNOSIS — N3000 Acute cystitis without hematuria: Secondary | ICD-10-CM | POA: Diagnosis not present

## 2019-05-18 DIAGNOSIS — L97219 Non-pressure chronic ulcer of right calf with unspecified severity: Secondary | ICD-10-CM | POA: Diagnosis not present

## 2019-05-18 LAB — BASIC METABOLIC PANEL
Anion gap: 11 (ref 5–15)
BUN: 21 mg/dL (ref 8–23)
CO2: 26 mmol/L (ref 22–32)
Calcium: 9.1 mg/dL (ref 8.9–10.3)
Chloride: 102 mmol/L (ref 98–111)
Creatinine, Ser: 1 mg/dL (ref 0.44–1.00)
GFR calc Af Amer: >60 mL/min (ref >60)
GFR calc non Af Amer: 58 mL/min — ABNORMAL LOW (ref >60)
Glucose, Bld: 93 mg/dL (ref 70–99)
Potassium: 3.9 mmol/L (ref 3.5–5.1)
Sodium: 139 mmol/L (ref 135–145)

## 2019-05-18 LAB — CBC
HCT: 30.4 % — ABNORMAL LOW (ref 36.0–46.0)
Hemoglobin: 9.3 g/dL — ABNORMAL LOW (ref 12.0–15.0)
MCH: 28.8 pg (ref 26.0–34.0)
MCHC: 30.6 g/dL (ref 30.0–36.0)
MCV: 94.1 fL (ref 80.0–100.0)
Platelets: 294 10*3/uL (ref 150–400)
RBC: 3.23 MIL/uL — ABNORMAL LOW (ref 3.87–5.11)
RDW: 15.5 % (ref 11.5–15.5)
WBC: 5.2 10*3/uL (ref 4.0–10.5)
nRBC: 0 % (ref 0.0–0.2)

## 2019-05-18 NOTE — Plan of Care (Signed)
  Problem: Education: Goal: Knowledge of General Education information will improve Description Including pain rating scale, medication(s)/side effects and non-pharmacologic comfort measures Outcome: Progressing   

## 2019-05-18 NOTE — Progress Notes (Signed)
Physical Therapy Treatment Patient Details Name: [name] MRN: [id number] DOB: 07/10/1951 Today's Date: 05/18/2019    History of Present Illness Patient is a 68 y/o female who presents with bilateral chronic venous stasis that have failed to improve 2/2 to noncompliance. PMHx:Bipolar I disorder with mania, afib    PT Comments    Continuing work on functional mobility and activity tolerance;  Pt remains to present with poor attitude towards mobility;  She continues to self limit and reports she will be fine mobilizing at home; Got to EOB and performed a few long arc quads; Then adamantly refused working on mobility and transfers despite my best efforts at education and encouragement; continue to recommend SNF for post-acute rehab to maximize independence and safety with mobility prior to going home alone  Follow Up Recommendations  SNF;Supervision for mobility/OOB;Supervision/Assistance - 24 hour     Equipment Recommendations  Wheelchair (measurements PT);Wheelchair cushion (measurements PT)    Recommendations for Other Services       Precautions / Restrictions Precautions Precautions: Fall Precaution Comments: wounds distal BLEs    Mobility  Bed Mobility Overal bed mobility: Needs Assistance Bed Mobility: Supine to Sit     Supine to sit: Supervision;HOB elevated     General bed mobility comments: Supervision for safety; Declined any offering of help; able to stabilize in sitting with UE support while moving bed to allow for more room  Transfers                 General transfer comment: Refused OOB  Ambulation/Gait             General Gait Details: Unable   Stairs             Wheelchair Mobility    Modified Rankin (Stroke Patients Only)       Balance     Sitting balance-Leahy Scale: Fair(approaching Good)                                      Cognition Arousal/Alertness: Awake/alert Behavior During Therapy:  Anxious;Agitated Overall Cognitive Status: History of cognitive impairments - at baseline Area of Impairment: Safety/judgement;Memory;Problem solving                               General Comments: Ramped up into an agitated state relatively quickly; Adamantly refusing OOB despite best efforts at education and encouragement      Exercises      General Comments        Pertinent Vitals/Pain Pain Assessment: Faces Faces Pain Scale: Hurts little more Pain Location: B hips and LEs Pain Descriptors / Indicators: Burning Pain Intervention(s): Monitored during session    Home Living                      Prior Function            PT Goals (current goals can now be found in the care plan section) Acute Rehab PT Goals Patient Stated Goal: did not state PT Goal Formulation: With patient Time For Goal Achievement: 06/01/19(Goals set 9/7 continue to be appropriate) Potential to Achieve Goals: Fair Progress towards PT goals: Not progressing toward goals - comment(self-limiting)    Frequency    Min 2X/week      PT Plan Current plan remains appropriate    Co-evaluation  AM-PAC PT "6 Clicks" Mobility   Outcome Measure  Help needed turning from your back to your side while in a flat bed without using bedrails?: A Little Help needed moving from lying on your back to sitting on the side of a flat bed without using bedrails?: None Help needed moving to and from a bed to a chair (including a wheelchair)?: A Lot Help needed standing up from a chair using your arms (e.g., wheelchair or bedside chair)?: A Lot Help needed to walk in hospital room?: Total Help needed climbing 3-5 steps with a railing? : Total 6 Click Score: 13    End of Session   Activity Tolerance: Other (comment)(Decreased participation) Patient left: in bed;with call bell/phone within reach;with bed alarm set(sitting EOB) Nurse Communication: Mobility status(Refusal of  getting oOB) PT Visit Diagnosis: Unsteadiness on feet (R26.81);Muscle weakness (generalized) (M62.81);Other abnormalities of gait and mobility (R26.89) Pain - part of body: Leg     Time: 3893-7342 PT Time Calculation (min) (ACUTE ONLY): 17 min  Charges:  $Therapeutic Activity: 8-22 mins                     Van Clines, PT  Acute Rehabilitation Services Pager [phone] Office [phone]    [name] 05/18/2019, 3:52 PM

## 2019-05-18 NOTE — Plan of Care (Signed)
  Problem: Activity: Goal: Risk for activity intolerance will decrease Outcome: Progressing   

## 2019-05-18 NOTE — Progress Notes (Signed)
OT Cancellation Note  Patient Details Name: KINDELL STRADA MRN: [id number] DOB: Jul 15, 1951   Cancelled Treatment:    Reason Eval/Treat Not Completed: Patient declined, no reason specified. Will follow-up for OT treatment as schedule permits    Aileen Pilot, [place] 05/18/2019, 3:58 PM

## 2019-05-18 NOTE — Progress Notes (Addendum)
Family Medicine Teaching Service Daily Progress Note Intern Pager: [phone]  Patient name: [name] Medical record number: [id number] Date of birth: [birth year] Age: 68 y.o. Gender: female  Primary Care Provider: Cleophas Dunker, DO Consultants: Psych, Wound care Code Status: Full   Pt Overview and Major Events to Date:  9/7-admitted, IV cefepime and vanc started 9/8-UTI E. Coli 9/9- IV abx d/c, PO keflex started 9/16: Keflex course ended  9/18: medically stable for SNF placement 9/20: C. Difficile positive   Assessment and Plan: [name] is a 68 y.o. femalepresented with lower extremity pain secondary to chronic venous stasis ulcers. PMH is significant foratrial fibrillation, anemiaand bipolar disorder. C. Difficile Enteritis  Reports more formed stools today. Reports some abdominal cramping. Patient with recent multiple antibiotics use for venous stasis ulcers. C.Difficile panel positive. Started on 10 day oral vanc course.   -Enteric precautions  -Vancomycin 125 mg PO Day 3/10  Chronic Venous Stasis Ulcers Wounds are healing well. Patient reports pain with wound dressing changes.  Patient completed antibiotic course (Cefepime/Vanc to Keflex). Patient not wearing prevalon boots. Continue to await SNF placement. Patient wants to get up and sit in the chair. Appreciate case management updates regarding placement. - PT/OT recommend SNF - Case management, appreciate recommendations - Social work, appreciate recommendations - Discontinue Tramadol  - follow up on Prevalon boots     UTI, Ecoli   Resolved. S/p Keflex   Personality Disorder  Psych consulted for concerns of bipolar and they believe her behavior is 2/2 to personality disorder, such as borderline personality disorder.  .  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  Continue supportive care.  - Tramadol 50mg  q12h PRN, wean as tolerated - Continue gabapentin 400mg  TID  - continue Duloxetine 30 mg PO qd for neuropathic pain   Hypotension BP 100/56 -110/58. Appears to have soft BPs at home per chart review of clinic visit vitals.  - continue to monitor   Paroxysmal atrial fibrillation - Pt declining anticoagulation.    Anemia: stable Per chart review, patient refused to have colonoscopy in February 2019 however patient states that she had a colonoscopy last year. Normocytic anemia;hemoglobin 9.0 and MCV 95.6.   - continue ferrous sulfate    History of cocaine use  - recommend cessation   AKI Cr 1.20 elevated baseline 1.0  - continue to monitor    FEN/GI: Heart healthy diet, MiraLAX PPx: Heparin  Disposition: SNF when bed available   Subjective:  Patient reports continued diarrhea.  No significant events overnight.  Objective: Temp:  [98.7 F (37.1 C)-99.1 F (37.3 C)] 98.7 F (37.1 C) (09/22 0539) Pulse Rate:  [63-69] 65 (09/22 0539) Resp:  [15-18] 15 (09/22 0539) BP: (100-107)/(56-59) 101/57 (09/22 0539) SpO2:  [95 %-100 %] 95 % (09/22 0539) Weight:  [79.5 kg] 79.5 kg (09/22 0539)  Physical Exam: GEN: pleasant, elderly in no acute distress CV: regular rate and rhythm, no murmurs appreciated  RESP: no increased work of breathing, clear to ascultation bilaterally with no crackles, wheezes, or rhonchi  ABD: Bowel sounds present. Soft, Nontender, Nondistended.  MSK: no LE edema, or cyanosis noted SKIN: warm, dry, LE ulcer wrapped   NEURO: grossly normal, moves all extremities appropriately      Laboratory: Recent Labs  Lab 05/13/19 0723 05/18/19 0530  05/19/19 0906  WBC 5.1 5.2 4.4  HGB 8.2* 9.3* 9.0*  HCT 27.2* 30.4* 30.4*  PLT 269 294 283   Recent Labs  Lab 05/13/19 0723 05/18/19 0530  NA 137 139  K 4.3 3.9  CL 97* 102  CO2 29 26  BUN 21 21   CREATININE 0.97 1.00  CALCIUM 8.8* 9.1  GLUCOSE 92 93     Imaging/Diagnostic Tests: No results found.   Katha CabalBrimage, Ayriel Texidor, MD 05/19/2019, 9:44 AM PGY-1, [facility] FPTS Intern pager: [phone], text pages welcome

## 2019-05-19 DIAGNOSIS — I83012 Varicose veins of right lower extremity with ulcer of calf: Secondary | ICD-10-CM | POA: Diagnosis not present

## 2019-05-19 DIAGNOSIS — L97219 Non-pressure chronic ulcer of right calf with unspecified severity: Secondary | ICD-10-CM | POA: Diagnosis not present

## 2019-05-19 DIAGNOSIS — N3000 Acute cystitis without hematuria: Secondary | ICD-10-CM | POA: Diagnosis not present

## 2019-05-19 DIAGNOSIS — L03116 Cellulitis of left lower limb: Secondary | ICD-10-CM | POA: Diagnosis not present

## 2019-05-19 LAB — BASIC METABOLIC PANEL
Anion gap: 10 (ref 5–15)
BUN: 19 mg/dL (ref 8–23)
CO2: 26 mmol/L (ref 22–32)
Calcium: 9 mg/dL (ref 8.9–10.3)
Chloride: 103 mmol/L (ref 98–111)
Creatinine, Ser: 1.2 mg/dL — ABNORMAL HIGH (ref 0.44–1.00)
GFR calc Af Amer: 54 mL/min — ABNORMAL LOW (ref >60)
GFR calc non Af Amer: 46 mL/min — ABNORMAL LOW (ref >60)
Glucose, Bld: 134 mg/dL — ABNORMAL HIGH (ref 70–99)
Potassium: 4.1 mmol/L (ref 3.5–5.1)
Sodium: 139 mmol/L (ref 135–145)

## 2019-05-19 LAB — CBC
HCT: 30.4 % — ABNORMAL LOW (ref 36.0–46.0)
Hemoglobin: 9 g/dL — ABNORMAL LOW (ref 12.0–15.0)
MCH: 28.3 pg (ref 26.0–34.0)
MCHC: 29.6 g/dL — ABNORMAL LOW (ref 30.0–36.0)
MCV: 95.6 fL (ref 80.0–100.0)
Platelets: 283 10*3/uL (ref 150–400)
RBC: 3.18 MIL/uL — ABNORMAL LOW (ref 3.87–5.11)
RDW: 15.6 % — ABNORMAL HIGH (ref 11.5–15.5)
WBC: 4.4 10*3/uL (ref 4.0–10.5)
nRBC: 0 % (ref 0.0–0.2)

## 2019-05-19 NOTE — Progress Notes (Signed)
Family Medicine Teaching Service Daily Progress Note Intern Pager: [phone]  Patient name: [name] Medical record number: [id number] Date of birth: Jan 19, 1951 Age: 68 y.o. Gender: female  Primary Care Provider: Cleophas Dunker, DO Consultants: Psych, Wound care Code Status: Full   Pt Overview and Major Events to Date:  9/07: admitted, IV cefepime and vanc started 9/08: UTI E. Coli 9/09: IV abx d/c, PO keflex started 9/16: Keflex course ended  9/18: medically stable for SNF placement 9/20: C. Difficile positive oral vanc started  Assessment and Plan: [name] is a 68 y.o. femalepresentedpresented with lower extremity pain secondary to chronic venous stasis ulcers. PMH is significant foratrial fibrillation, anemiaand bipolar disorder.  C. Difficile Enteritis  Patient reports formed stools and decreased number of bowel movements.   Oral vancomycin course for 10 days (9/20-9/29) -Enteric precautions  -Continue Vancomycin 125 mg PO Day 4/10  Chronic Venous Stasis Ulcers Wounds are healing well. Patient reports pain with wound dressing changes.  Patient completed antibiotic course (Cefepime/Vanc to Keflex).  Reconsult wound nurse for outpatient augmentations.  Awaiting patient acceptance to SNF and/or home health as patient unable to care for her wounds at home. Case management continues to work on potential bed placement. - PT/OT recommend SNF - Case management, appreciate recommendations - Social work, appreciate recommendations - Prevalon boots    UTI, Ecoli   Resolved. S/p Keflex   Personality Disorder  Psych consulted for concerns of bipolar and they believe her behavior is 2/2 to personality disorder, such as borderline personality disorder.  .  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  Continue supportive care.  - Continue gabapentin 400mg  TID  - continue Duloxetine 68 mg PO qd for neuropathic pain   Hypotension BP 101/54-119/60. Appears to have soft BPs at home per chart review of clinic visit vitals.  - continue to monitor   Paroxysmal atrial fibrillation - Pt declining anticoagulation.    Anemia: stable Per chart review, patient refused to have colonoscopy in February 2019 however patient states that she had a colonoscopy last year.  - continue ferrous sulfate    History of cocaine use  - recommend cessation   AKI, resolved Creatinine 1.2 - continue to monitor    FEN/GI: Heart healthy diet, MiraLAX PPx: Heparin  Disposition: SNF when bed available   Subjective:  Patient reports significant improvement in her diarrhea today.  She is having mostly formed stools. No overnight significant events.  Objective: Temp:  [98.2 F (36.8 C)-99.5 F (37.5 C)] 98.2 F (36.8 C) (09/23 0906) Pulse Rate:  [61-73] 72 (09/23 0906) Resp:  [16-18] 18 (09/23 0906) BP: (101-119)/(54-62) 102/54 (09/23 0906) SpO2:  [94 %-98 %] 96 % (09/23 0906) Weight:  [79.5 kg] 79.5 kg (09/22 2053)  Physical Exam: GEN: Elderly female, lying in bed in no acute distress CV: Regular rate, irregularly irregular rhythm, no murmurs appreciated  RESP: no increased work of breathing, clear to ascultation bilaterally with no crackles, wheezes, or rhonchi  ABD: Bowel sounds present. Soft, Nontender, Nondistended.  SKIN: warm, dry, see photo below  NEURO: grossly normal, moves all extremities appropriately      Laboratory: Recent Labs  Lab 05/18/19 0530 05/19/19 0906  WBC 5.2 4.4  HGB 9.3* 9.0*  HCT 30.4* 30.4*  PLT 294 283  Recent Labs  Lab 05/18/19 0530 05/19/19 0906  NA 139 139  K 3.9 4.1  CL 102 103  CO2 26 26  BUN 21 19  CREATININE 1.00 1.20*  CALCIUM 9.1 9.0  GLUCOSE 93 134*     Imaging/Diagnostic Tests: No results found.   Caroline Cabal, MD 05/20/2019, 9:38 AM PGY-1, [facility] Family Medicine FPTS Intern pager: [phone], text pages welcome

## 2019-05-19 NOTE — TOC Progression Note (Signed)
Transition of Care [facility]) - Progression Note    Patient Details  Name: [name] MRN: [id number] Date of Birth: [birth year]  [facility]) CM/SW Contact  Bartholomew Crews, RN Phone Number: [phone] 05/19/2019, 3:09 PM  Clinical Narrative:    Spoke with patient at the bedside. PTA living at [facility] in [place]. Discussed need for PCS through Medicaid - patient in agreement with expediting services. Form completed, signed by MD, and faxed to [employer] at [phone] - pending call back. Discussed patient choice for home care agencies - #1 [facility] and #2 [facility]. Discussed with patient George needs - referral placed to [facility] for RN, PT, OT. Patient will need HH orders for RN, PT, OT. Discussed DME needs - patient requesting walker without wheels, rollator, and a manual wheelchair. PT to assess and make recommendations. Patient states that her friend, [name], will pick her up at discharge. CM to follow for transition of care needs.    Expected Discharge Plan: [place] Barriers to Discharge: Continued Medical Work up  Expected Discharge Plan and Services Expected Discharge Plan: [place] Choice: [place] arrangements for the past 2 months: Single Family Home                                       Social Determinants of Health (SDOH) Interventions    Readmission Risk Interventions No flowsheet data found.

## 2019-05-19 NOTE — Clinical Social Work Note (Addendum)
Contact made with [facility] admissions director Cleon Dew regarding patient. Per Gerald Stabs, they are only accepting COVID positive patients. He suggested that [place] contact [facility]. Talked with Janett Billow, Director of the [employer] (and assists admissions staff) regarding patient and her insurance. Per Janett Billow, Medicare B pays for rehab and they do accept persons with Medicaid, however they don't have any rehab or LTC care bed availability. She inidcated that they don;t have any bed availability at this time, and recommended that CSW call at the end of the week or next week. CSW will continue SNF search for patient.  1:19 pm - CSW talked with patient at the bedside. Ms. Service was sitting up in bed watching TV and working in a puzzle book when [facility] arrived. Patient was alert, oriented with intermittent confusion. CSW introduced self and stated purpose of visit - discharge plan. Patient indicated that she wants to discharge home and that her friend, Mr. Terrill Mohr can assist her. Patient added that he and his girlfriend provide her with assistance at times. She gave CSW permission to contact Mr. Kenton Kingfisher [phone]). Reason for call explained and Mr. Kenton Kingfisher reported that he does assist patient at times, however he looks after his 84 year old mother and has his own health issues, and cannot be available to patient every day.    Talked further with patient regarding LTC SNF placement (staying a minimum of 30 days)  and explained that she will have to surrender her check (except for a small amount left for her) to the SNF. Ms. Scronce reported that she cannot do this as she has rent and other bills to pay. CSW expressed understanding and informed patient that the MD will be contacted.   Consulted with nurse case manager and was informed that patient may be eligbile for some [place] services. Contacted MD-Brimage and updated her on placement issues and availability of [place] resources. Nurse case  manager Manya Silvas talked with MD and provided paperwork for her signature.  CSW will continue to follow and provide any SW intervention services needed through discharge.  Marielouise Amey Givens, MSW, LCSW Licensed Clinical Social Worker [place] [phone]

## 2019-05-20 DIAGNOSIS — L03116 Cellulitis of left lower limb: Secondary | ICD-10-CM | POA: Diagnosis not present

## 2019-05-20 DIAGNOSIS — I83012 Varicose veins of right lower extremity with ulcer of calf: Secondary | ICD-10-CM | POA: Diagnosis not present

## 2019-05-20 DIAGNOSIS — L97219 Non-pressure chronic ulcer of right calf with unspecified severity: Secondary | ICD-10-CM | POA: Diagnosis not present

## 2019-05-20 DIAGNOSIS — N3000 Acute cystitis without hematuria: Secondary | ICD-10-CM | POA: Diagnosis not present

## 2019-05-20 NOTE — TOC Progression Note (Signed)
Transition of Care [facility]) - Progression Note    Patient Details  Name: [name] MRN: [id number] Date of Birth: 02/27/1951  Transition of Care [facility]) CM/SW Contact  Bartholomew Crews, RN Phone Number: [phone] 05/20/2019, 10:30 AM  Clinical Narrative:    Received call from [facility] late yesterday. Unable to accept patient d/t unable to provide RN. CM to continue search for [facility] agency.   Expected Discharge Plan: [place] Barriers to Discharge: Continued Medical Work up  Expected Discharge Plan and Services Expected Discharge Plan: [place] Choice: [place] arrangements for the past 2 months: Single Family Home                                       Social Determinants of Health (SDOH) Interventions    Readmission Risk Interventions No flowsheet data found.

## 2019-05-20 NOTE — TOC Progression Note (Addendum)
Transition of Care [facility]) - Progression Note    Patient Details  Name: WESLEE PRESTAGE MRN: [id number] Date of Birth: 06-13-1951  Transition of [facility]) CM/SW Contact  Bartholomew Crews, RN Phone Number: [phone] 05/20/2019, 12:34 PM  Clinical Narrative:    Spoke with patient at the bedside. Discussed call from [facility] about PCS. Patient may receive a call by end of week to establish services. Advised patient that she should receive a call from the assigned agency. Discussed [place] agency needs for RN, PT, OT. Patient asking for [facility].   [facility] declined patient for home health services. [facility] also declined home health services. Patient does not have Medicare A which is what pays for home health services.   Pending PT follow up for equipment recommendations.   CM following for transition of care needs.    Expected Discharge Plan: [place] Barriers to Discharge: Continued Medical Work up  Expected Discharge Plan and Services Expected Discharge Plan: [place] Choice: [place] arrangements for the past 2 months: Single Family Home                                       Social Determinants of Health (SDOH) Interventions    Readmission Risk Interventions No flowsheet data found.

## 2019-05-20 NOTE — TOC Progression Note (Signed)
Transition of Care [facility]) - Progression Note    Patient Details  Name: [name] MRN: [id number] Date of Birth: 07-08-1951  Transition [facility] Care [facility]) CM/SW Contact  Bartholomew Crews, RN Phone Number: [phone] 05/20/2019, 4:44 PM  Clinical Narrative:    Spoke with patient at bedside for ongoing discussion of transition of care needs. Patient would like to use [facility] for medications. Patient assisted with completing [employer] eligibility application and application was faxed to [employer] - original copies provided to patient. Discussed transition home changed to Friday to allow patient to work on mobility. Encouraged patient to work with therapy and nursing staff with out of bed and ambulation. Patient agreed. Patient will need orders for DME to include manual wheelchair and rolling walker. CM will continue to follow for transition of care needs.    Expected Discharge Plan: [place] Barriers to Discharge: Continued Medical Work up  Expected Discharge Plan and Services Expected Discharge Plan: [place] Choice: [place] arrangements for the past 2 months: Single Family Home                                       Social Determinants of Health (SDOH) Interventions    Readmission Risk Interventions No flowsheet data found.

## 2019-05-20 NOTE — Progress Notes (Signed)
Physical Therapy  Patient suffers from Acute kidney injury, Bil LE wounds,  which impairs their ability to perform daily activities like walking, and general mobiltiy in the home.  A walker alone will not resolve the issues with performing activities of daily living. A wheelchair will allow patient to safely perform daily activities.  The patient can self propel in the home or has a caregiver who can provide assistance.     Roney Marion, [place]  Acute Rehabilitation Services Pager [phone] Office [phone]

## 2019-05-20 NOTE — Plan of Care (Signed)
  Problem: Education: Goal: Knowledge of General Education information will improve Description: Including pain rating scale, medication(s)/side effects and non-pharmacologic comfort measures Outcome: Completed/Met   Problem: Health Behavior/Discharge Planning: Goal: Ability to manage health-related needs will improve Outcome: Completed/Met   Problem: Clinical Measurements: Goal: Ability to maintain clinical measurements within normal limits will improve Outcome: Completed/Met Goal: Will remain free from infection Outcome: Completed/Met Goal: Diagnostic test results will improve Outcome: Completed/Met Goal: Respiratory complications will improve Outcome: Completed/Met Goal: Cardiovascular complication will be avoided Outcome: Completed/Met   Problem: Nutrition: Goal: Adequate nutrition will be maintained Outcome: Completed/Met   Problem: Coping: Goal: Level of anxiety will decrease Outcome: Completed/Met   Problem: Elimination: Goal: Will not experience complications related to bowel motility Outcome: Completed/Met Goal: Will not experience complications related to urinary retention Outcome: Completed/Met   Problem: Pain Managment: Goal: General experience of comfort will improve Outcome: Completed/Met   Problem: Skin Integrity: Goal: Risk for impaired skin integrity will decrease Outcome: Completed/Met   Problem: Clinical Measurements: Goal: Ability to avoid or minimize complications of infection will improve Outcome: Completed/Met   Problem: Skin Integrity: Goal: Skin integrity will improve Outcome: Completed/Met

## 2019-05-20 NOTE — Progress Notes (Signed)
Physical Therapy Treatment Patient Details Name: [name] MRN: [id number] DOB: 1950/11/03 Today's Date: 05/20/2019    History of Present Illness Patient is a 68 y/o female who presents with bilateral chronic venous stasis that have failed to improve 2/2 to noncompliance. PMHx:Bipolar I disorder with mania, afib    PT Comments    Continuing work on functional mobility and activity tolerance;  Initiated session by making clear to [name] that PT's role in her care is to prepare for going to the next place after leaving the hospital (in this case, looks like she will be going home as she has no payor source for post-acute rehab, or is refusing to sign over her check);   Better participation today, and [name] was able to practice the lateral scoot transfer to simulate getting to a wheelchair, and sit<>stand transfers with  RW; she walked 5 feet with RW and chair pushed behind her; She is very fearful of falling; She seemed happy with her progress today;   She does not have 24 hour assist at home -- post-acute rehabilitation is her best option to maximize independence and safety with mobility, however that option is not panning out; would try to get as much services as possible in the home  Follow Up Recommendations  SNF;Supervision for mobility/OOB;Supervision/Assistance - 24 hour  Sounds like she will have no choice except to dc home -- would maximize services.     Equipment Recommendations  Wheelchair (measurements PT);Wheelchair cushion (measurements PT);Other (comment)(consider medical Lucianne Lei transport)    Recommendations for Other Services       Precautions / Restrictions Precautions Precautions: Fall Precaution Comments: wounds distal BLEs    Mobility  Bed Mobility Overal bed mobility: Needs Assistance Bed Mobility: Supine to Sit     Supine to sit: Supervision;HOB elevated     General bed mobility comments: Supervision for safety, and  encouragement  Transfers Overall transfer level: Needs assistance Equipment used: Rolling walker (2 wheeled) Transfers: Lateral/Scoot Transfers;Sit to/from Stand Sit to Stand: Min assist;+2 safety/equipment        Lateral/Scoot Transfers: Min guard(without physical contact) General transfer comment: Performed lateral scoot transfer bed to recliner (with armrest down) to simulate getting to wheelchair at home; Many small, inefficient scoots, and cues to shift weight forward onto feet to allow for unweighing of hips to scoot; slow, but ultimately she did not need physical assist; Once in chair, practiced sit to stand to RW with +2 support bilaterally for safety; cues for hand placment; Used a 1,2,3 count to stand, min assist to steady  Ambulation/Gait Ambulation/Gait assistance: Min assist;+2 safety/equipment(chair push) Gait Distance (Feet): 5 Feet Assistive device: Rolling walker (2 wheeled) Gait Pattern/deviations: Decreased step length - right;Decreased step length - left     General Gait Details: Lots of encouragement; heavy dependence on UE support from [employer]             Wheelchair Mobility    Modified Rankin (Stroke Patients Only)       Balance     Sitting balance-Leahy Scale: Fair(approaching Good)     Standing balance support: During functional activity Standing balance-Leahy Scale: Poor Standing balance comment: Reliant on RW fo rsupport and balance                            Cognition Arousal/Alertness: Awake/alert Behavior During Therapy: WFL for tasks assessed/performed;Impulsive Overall Cognitive Status: History of cognitive impairments - at baseline  General Comments: Better participation, with the knowledge that in order to go home, she must work with therapies and practice mobility; One instance where she started to ramp up into agitation, but she was able to control an dorganize  herself today when I told her I will leave if she does not communicate appropriately.      Exercises      General Comments        Pertinent Vitals/Pain Pain Assessment: Faces Faces Pain Scale: Hurts a little bit Pain Location: B hips and LEs Pain Descriptors / Indicators: Grimacing Pain Intervention(s): Monitored during session    Home Living                      Prior Function            PT Goals (current goals can now be found in the care plan section) Acute Rehab PT Goals Patient Stated Goal: to go home PT Goal Formulation: With patient Time For Goal Achievement: 06/01/19 Potential to Achieve Goals: Fair Progress towards PT goals: Progressing toward goals    Frequency    Min 3X/week      PT Plan Frequency needs to be updated;Discharge plan needs to be updated    Co-evaluation              AM-PAC PT "6 Clicks" Mobility   Outcome Measure  Help needed turning from your back to your side while in a flat bed without using bedrails?: A Little Help needed moving from lying on your back to sitting on the side of a flat bed without using bedrails?: None Help needed moving to and from a bed to a chair (including a wheelchair)?: None Help needed standing up from a chair using your arms (e.g., wheelchair or bedside chair)?: A Lot Help needed to walk in hospital room?: A Lot Help needed climbing 3-5 steps with a railing? : Total 6 Click Score: 16    End of Session Equipment Utilized During Treatment: Gait belt Activity Tolerance: Patient tolerated treatment well Patient left: in chair;with call bell/phone within reach;with chair alarm set Nurse Communication: Mobility status PT Visit Diagnosis: Unsteadiness on feet (R26.81);Muscle weakness (generalized) (M62.81);Other abnormalities of gait and mobility (R26.89) Pain - Right/Left: (bil) Pain - part of body: Leg     Time: 6945-0388 PT Time Calculation (min) (ACUTE ONLY): 34 min  Charges:  $Gait  Training: 8-22 mins $Therapeutic Activity: 8-22 mins                     Van Clines, PT  Acute Rehabilitation Services Pager [phone] Office [phone]    Levi Aland 05/20/2019, 4:07 PM

## 2019-05-20 NOTE — Consult Note (Signed)
[place] Nurse wound follow up Patient receiving care in [facility] 5M03. Wound type: BLE venous stasis wound Measurement: According to Cardell Peach, primary RN for patient, the provider and she will change the dressings later today and take photos for the record. [place] nurses (no bedside nurse) at [employer] facilities can place photos in the EMR, only providers. Wound bed: deferred.  But according to the note from Marzetta Merino, MD 05/20/19 at [phone], the wounds are improving.  The patient confirms this as well. Dressing procedure/placement/frequency: The current therapy of Vaseline gauze dressings with kerlex, change every 2 days should be continued, as it is currently assisting with wound healing. Monitor the wound area(s) for worsening of condition such as: Signs/symptoms of infection,  Increase in size,  Development of or worsening of odor, Development of pain, or increased pain at the affected locations.  Notify the medical team if any of these develop.  Thank you for the consult.  Discussed plan of care with the patient and bedside nurse.  [place] nurse will not follow at this time.  Please re-consult the [place] team if needed.  Val Riles, RN, MSN, CWOCN, CNS-BC, pager [phone]

## 2019-05-20 NOTE — Progress Notes (Signed)
[place] nurse called and stated that she is unable to take pictures and upload them to [place]. MD notified. MD stated that they would be up after 1200 to assess wounds with dressing changes. Patient updated of this information.

## 2019-05-21 DIAGNOSIS — L03116 Cellulitis of left lower limb: Secondary | ICD-10-CM | POA: Diagnosis not present

## 2019-05-21 DIAGNOSIS — F4325 Adjustment disorder with mixed disturbance of emotions and conduct: Secondary | ICD-10-CM | POA: Diagnosis not present

## 2019-05-21 DIAGNOSIS — N3000 Acute cystitis without hematuria: Secondary | ICD-10-CM | POA: Diagnosis not present

## 2019-05-21 LAB — CBC
HCT: 29.8 % — ABNORMAL LOW (ref 36.0–46.0)
Hemoglobin: 9 g/dL — ABNORMAL LOW (ref 12.0–15.0)
MCH: 28.8 pg (ref 26.0–34.0)
MCHC: 30.2 g/dL (ref 30.0–36.0)
MCV: 95.5 fL (ref 80.0–100.0)
Platelets: 278 10*3/uL (ref 150–400)
RBC: 3.12 MIL/uL — ABNORMAL LOW (ref 3.87–5.11)
RDW: 16 % — ABNORMAL HIGH (ref 11.5–15.5)
WBC: 4.8 10*3/uL (ref 4.0–10.5)
nRBC: 0 % (ref 0.0–0.2)

## 2019-05-21 LAB — BASIC METABOLIC PANEL
Anion gap: 9 (ref 5–15)
BUN: 23 mg/dL (ref 8–23)
CO2: 26 mmol/L (ref 22–32)
Calcium: 8.9 mg/dL (ref 8.9–10.3)
Chloride: 104 mmol/L (ref 98–111)
Creatinine, Ser: 1.23 mg/dL — ABNORMAL HIGH (ref 0.44–1.00)
GFR calc Af Amer: 52 mL/min — ABNORMAL LOW (ref >60)
GFR calc non Af Amer: 45 mL/min — ABNORMAL LOW (ref >60)
Glucose, Bld: 95 mg/dL (ref 70–99)
Potassium: 4.2 mmol/L (ref 3.5–5.1)
Sodium: 139 mmol/L (ref 135–145)

## 2019-05-21 NOTE — Progress Notes (Signed)
Physical Therapy Treatment Patient Details Name: [name] MRN: [id number] DOB: 1951-07-02 Today's Date: 05/21/2019    History of Present Illness Patient is a 68 y/o female who presents with bilateral chronic venous stasis that have failed to improve 2/2 to noncompliance. PMHx:Bipolar I disorder with mania, afib    PT Comments    Patient received in bed, trying to reach neighbor by telephone. Patient agrees to try to walk. Requires supervision for supine to sit. Min assist for sit to stand from elevated surface with cues for hand placement. Patient ambulated 3 steps forward and back, two steps sideways up to head of bed. Patient limited in ambulation due to reported knee pain on left and ankle pain on right. Patient will benefit from continued skilled PT to improve functional independence and safety with mobility.       Follow Up Recommendations  SNF;Supervision for mobility/OOB     Equipment Recommendations  Wheelchair (measurements PT);Wheelchair cushion (measurements PT)    Recommendations for Other Services       Precautions / Restrictions Precautions Precautions: Fall Precaution Comments: wounds distal BLEs Restrictions Weight Bearing Restrictions: No    Mobility  Bed Mobility Overal bed mobility: Needs Assistance Bed Mobility: Supine to Sit;Sit to Supine     Supine to sit: Supervision;HOB elevated Sit to supine: HOB elevated;Supervision   General bed mobility comments: Supervision for safety, and encouragement  Transfers Overall transfer level: Needs assistance Equipment used: Rolling walker (2 wheeled) Transfers: Sit to/from Stand Sit to Stand: Min assist         General transfer comment: Min assist from sit to stand with bed height elevated slightly. Cues for hand placement  Ambulation/Gait Ambulation/Gait assistance: Min guard Gait Distance (Feet): 5 Feet Assistive device: Rolling walker (2 wheeled) Gait Pattern/deviations: Step-to  pattern;Antalgic Gait velocity: decreased   General Gait Details: Patient requires cues for safe use of RW, pain in knee limiting ability to ambulate. Took 3 steps forward, 3 steps back and 2 steps along side of bed.   Stairs             Wheelchair Mobility    Modified Rankin (Stroke Patients Only)       Balance Overall balance assessment: Needs assistance Sitting-balance support: Feet supported Sitting balance-Leahy Scale: Good     Standing balance support: Bilateral upper extremity supported Standing balance-Leahy Scale: Fair                              Cognition Arousal/Alertness: Awake/alert Behavior During Therapy: WFL for tasks assessed/performed Overall Cognitive Status: History of cognitive impairments - at baseline Area of Impairment: Problem solving;Safety/judgement                     Memory: Decreased short-term memory Following Commands: Follows one step commands inconsistently;Follows one step commands with increased time Safety/Judgement: Decreased awareness of deficits   Problem Solving: Requires verbal cues General Comments: Patient cooperative, limited this session by reported knee pain with ambulation.      Exercises Other Exercises Other Exercises: seated marching, LAQ, supine SLR, hip abd/add x 10 reps each bilaterally    General Comments        Pertinent Vitals/Pain Pain Assessment: 0-10 Faces Pain Scale: Hurts little more Pain Location: Right ankle, L knee Pain Descriptors / Indicators: Aching;Discomfort;Sore Pain Intervention(s): Monitored during session;Limited activity within patient's tolerance;Repositioned    Home Living  Prior Function            PT Goals (current goals can now be found in the care plan section) Acute Rehab PT Goals Patient Stated Goal: to go home PT Goal Formulation: With patient Time For Goal Achievement: 06/01/19 Potential to Achieve Goals:  Fair Progress towards PT goals: Progressing toward goals    Frequency    Min 3X/week      PT Plan Current plan remains appropriate    Co-evaluation              AM-PAC PT "6 Clicks" Mobility   Outcome Measure  Help needed turning from your back to your side while in a flat bed without using bedrails?: None Help needed moving from lying on your back to sitting on the side of a flat bed without using bedrails?: None Help needed moving to and from a bed to a chair (including a wheelchair)?: None Help needed standing up from a chair using your arms (e.g., wheelchair or bedside chair)?: A Little Help needed to walk in hospital room?: A Lot Help needed climbing 3-5 steps with a railing? : Total 6 Click Score: 18    End of Session Equipment Utilized During Treatment: Gait belt Activity Tolerance: Patient limited by pain Patient left: in bed;with call bell/phone within reach Nurse Communication: Mobility status PT Visit Diagnosis: Muscle weakness (generalized) (M62.81);Difficulty in walking, not elsewhere classified (R26.2);Pain;Unsteadiness on feet (R26.81) Pain - Right/Left: (bilateral)     Time: 2951-8841 PT Time Calculation (min) (ACUTE ONLY): 20 min  Charges:  $Therapeutic Exercise: 8-22 mins                     Siarah Deleo, PT, GCS 05/21/19,1:59 PM
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
## 2019-05-22 DIAGNOSIS — L03116 Cellulitis of left lower limb: Secondary | ICD-10-CM | POA: Diagnosis not present

## 2019-05-22 DIAGNOSIS — L98499 Non-pressure chronic ulcer of skin of other sites with unspecified severity: Secondary | ICD-10-CM | POA: Diagnosis not present

## 2019-05-22 DIAGNOSIS — L089 Local infection of the skin and subcutaneous tissue, unspecified: Secondary | ICD-10-CM | POA: Diagnosis not present

## 2019-05-22 DIAGNOSIS — N3000 Acute cystitis without hematuria: Secondary | ICD-10-CM | POA: Diagnosis not present

## 2019-05-22 LAB — CBC
HCT: 34.7 % — ABNORMAL LOW (ref 36.0–46.0)
Hemoglobin: 10.4 g/dL — ABNORMAL LOW (ref 12.0–15.0)
MCH: 28.4 pg (ref 26.0–34.0)
MCHC: 30 g/dL (ref 30.0–36.0)
MCV: 94.8 fL (ref 80.0–100.0)
Platelets: 317 10*3/uL (ref 150–400)
RBC: 3.66 MIL/uL — ABNORMAL LOW (ref 3.87–5.11)
RDW: 16.1 % — ABNORMAL HIGH (ref 11.5–15.5)
WBC: 4.9 10*3/uL (ref 4.0–10.5)
nRBC: 0 % (ref 0.0–0.2)

## 2019-05-22 LAB — BASIC METABOLIC PANEL
Anion gap: 10 (ref 5–15)
BUN: 23 mg/dL (ref 8–23)
CO2: 27 mmol/L (ref 22–32)
Calcium: 9.4 mg/dL (ref 8.9–10.3)
Chloride: 102 mmol/L (ref 98–111)
Creatinine, Ser: 1.1 mg/dL — ABNORMAL HIGH (ref 0.44–1.00)
GFR calc Af Amer: 60 mL/min — ABNORMAL LOW (ref >60)
GFR calc non Af Amer: 52 mL/min — ABNORMAL LOW (ref >60)
Glucose, Bld: 88 mg/dL (ref 70–99)
Potassium: 4.3 mmol/L (ref 3.5–5.1)
Sodium: 139 mmol/L (ref 135–145)

## 2019-05-22 MED ORDER — VANCOMYCIN HCL 125 MG PO CAPS: 125.0000 mg | ORAL_CAPSULE | Freq: Four times a day (QID) | ORAL | 0 refills | Status: DC

## 2019-05-22 MED ORDER — GABAPENTIN 400 MG PO CAPS: 400.0000 mg | ORAL_CAPSULE | Freq: Three times a day (TID) | ORAL | 0 refills | Status: DC

## 2019-05-22 MED ORDER — TRAZODONE HCL 50 MG PO TABS: 50.0000 mg | ORAL_TABLET | Freq: Every day | ORAL | 0 refills | Status: DC

## 2019-05-22 MED ORDER — DULOXETINE HCL 30 MG PO CPEP: 30.0000 mg | ORAL_CAPSULE | Freq: Every day | ORAL | 0 refills | Status: DC

## 2019-05-22 MED ORDER — FERROUS SULFATE 325 (65 FE) MG PO TABS: 325.0000 mg | ORAL_TABLET | Freq: Every day | ORAL | 3 refills | Status: DC

## 2019-05-22 NOTE — Progress Notes (Signed)
Received call back from DSS in response to message left Wednesday. Discussed patient concerns for medical transportation and need for assistance with cell phone minutes - [employer]. Advised that [facility] was following up with her for [facility] worker. Message to be sent to patient's case worker.   Manya Silvas, RN MSN CCM Transitions of Care [phone]

## 2019-05-22 NOTE — Progress Notes (Signed)
Family Medicine Teaching Service Attending Brief Progress Note  S: Patient seen and examined. Patient reports she's doing well. Plans to go home today. No specific concerns.  O: BP (!) 108/54 (BP Location: Right Arm)   Pulse 72   Temp 98.3 F (36.8 C) (Oral)   Resp 18   Ht 5' 6.5" (1.689 m)   Wt 79.5 kg   SpO2 94%   BMI 27.87 kg/m   Gen: no acute distress, pleasant, cooperative Resp: normal work of breathing   A/P:  67 yo F with chronic leg wounds - improving C difficile - improving Mobility - has been working with physical therapy.  Plan is discharge today with outpatient follow up. Patient agreeable.  Will cosign resident note when it is available.  Chrisandra Netters, MD [place]

## 2019-05-22 NOTE — Progress Notes (Signed)
DISCHARGE NOTE SNF [facility] to be discharged Home per MD order. Patient verbalized understanding.  Skin clean, dry and intact without evidence of skin break down, no evidence of skin tears noted. IV catheter discontinued intact. Site without signs and symptoms of complications. Dressing and pressure applied. Pt denies pain at the site currently. No complaints noted.  Patient free of lines, drains, and wounds.   Discharge packet assembled. An After Visit Summary (AVS) was printed and given to the patient at bedside. Patient escorted via wheelchair and discharged to designated transporter. All questions and concerns addressed.   Dolores Hoose, RN

## 2019-05-22 NOTE — TOC Transition Note (Signed)
Transition of Care [facility]) - CM/SW Discharge Note   Patient Details  Name: [name] MRN: [id number] Date of Birth: 20-Jan-1951  Transition of [facility] CM/SW Contact:  Bartholomew Crews, RN Phone Number: [phone] 05/22/2019, 3:05 PM   Clinical Narrative:    Spoke with patient at bedside to discuss transportation options. Attending at bedside. Finally able to arrange wheelchair with [facility] for this afternoon - scheduled 3:30pm pick up. MD notified. Nursing notified. Requested medications be sent to [facility] who can also deliver. Left voicemail with [facility] to advise of patient discharge this afternoon - RN to follow up to arrange PCS workers.    Final next level of care: Home/Self Care Barriers to Discharge: No Barriers Identified   Patient Goals and CMS Choice Patient states their goals for this hospitalization and ongoing recovery are:: to go back to her apartment today CMS Medicare.gov Compare Post Acute Care list provided to:: Patient Choice offered to / list presented to : Patient  Discharge Placement                       Discharge Plan and Services     Post Acute Care Choice: [place]          DME Arranged: Walker rolling with seat, Wheelchair manual DME Agency: [employer] Date DME Agency Contacted: 05/21/19   Representative spoke with at DME Agency: [facility] HH Arranged: PCS/[facility] Agency: Other - See comment([facility]) Date HH Agency Contacted: 05/22/19 Time [place]: 8 Representative spoke with at [facility]: left voicemail for [facility] to advise of discharge  Social Determinants of Health (SDOH) Interventions     Readmission Risk Interventions No flowsheet data found.

## 2019-05-22 NOTE — Discharge Instructions (Signed)
While you were in the hospital you were treated for an infection of the skin of your lower legs, a diarrheal illness, and a urinary tract infection.  At this time you have completed antibiotics for both your leg infection and your urinary tract infection, but we are continuing to treat your diarrheal illness with antibiotics.  1.  Please follow-up with wound care clinic on 10/13 at 2:30 to take care of your healing legs.  Regular changing of your leg dressings and regular care with wound care clinic will help get you better so you can stay out of the hospital.  2.  You have a telehealth visit with Dr. Criss Rosales with the [facility] on 9/28. You should expect a call.  3.  For your diarrheal illness, you are taking oral vancomycin 4 times a day until September 30.  It is very important that you finish this antibiotic completely to fully heal yourself and end the diarrheal illness.  4.  Please seek immediate medical care if you have a fever greater than 100.4 F, or temperature lower than 97.5 F, any difficulty breathing, nausea, vomiting, or diarrhea that prevents you from drinking any fluids for more than 24 hours.

## 2019-05-22 NOTE — Plan of Care (Signed)
  Problem: Activity: Goal: Risk for activity intolerance will decrease Outcome: Progressing   

## 2019-05-22 NOTE — Plan of Care (Signed)
  Problem: Activity: Goal: Risk for activity intolerance will decrease Outcome: Progressing   Problem: Safety: Goal: Ability to remain free from injury will improve Outcome: Progressing   

## 2019-05-25 ENCOUNTER — Telehealth (INDEPENDENT_AMBULATORY_CARE_PROVIDER_SITE_OTHER): Payer: Medicare Other | Admitting: Family Medicine

## 2019-05-25 ENCOUNTER — Other Ambulatory Visit: Payer: Self-pay

## 2019-05-25 DIAGNOSIS — A0472 Enterocolitis due to Clostridium difficile, not specified as recurrent: Secondary | ICD-10-CM | POA: Diagnosis not present

## 2019-05-25 MED ORDER — VANCOMYCIN HCL 125 MG PO CAPS: 125.0000 mg | ORAL_CAPSULE | Freq: Four times a day (QID) | ORAL | 0 refills | Status: AC
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
## 2019-05-26 ENCOUNTER — Telehealth: Payer: Self-pay | Admitting: Licensed Clinical Social Worker

## 2019-05-26 ENCOUNTER — Inpatient Hospital Stay: Payer: Medicare Other | Admitting: Family Medicine
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
## 2019-05-27 NOTE — Progress Notes (Signed)
I'm ATC on Friday morning.  Would you be able to call her and see if we can get her scheduled for a virtual visit with me sometime that morning please?

## 2019-05-27 NOTE — Progress Notes (Signed)
LVM for pt to call office back to see about scheduling her an appointment on ATC on Friday am with [facility] per PCP. [place] Jeff Zimmerman Rumple, CMA

## 2019-05-28 NOTE — Progress Notes (Signed)
Spoke with pt and she stated that she wanted to have the supplies shipped to her for her to wrap her legs. Informed her that PCP was wanting to schedule an appointment to see how things are going and she said that the only thing she was waiting on was for someone to finish cleaning her apartment.  Informed that I was unsure of a company that would deliver the supplies to wrap her legs and she said that she has [employer] deliver to her and that she likes [employer] better.   Informed her that I would check with PCP to see what options may be available for this and then I would get back with her and we would see about scheduling the appointment. Routing to PCP to be advised.[name], CMA \

## 2019-05-29 ENCOUNTER — Telehealth: Payer: Self-pay | Admitting: Licensed Clinical Social Worker

## 2019-05-29 NOTE — Progress Notes (Signed)
Patient now scheduled for appointment on Monday Oct 5.  Continuing to work with social work to find agency to assist with home would care.

## 2019-05-29 NOTE — Telephone Encounter (Signed)
   Care Coordination Follow up Phone Note Social Work   05/29/2019 Name: [name] MRN: [id number] DOB: 30-Apr-1951  Reason for follow-up: phone encounter with patient today to provide an update on locating a home health agency to provide personal care services. Multi calls made to 1/2 of the PCS approved providers for [place] from [facility]. No providers willing to take patient due to her behavior.   Patient reports:concerns with not having transportation to appointments, as well as legs and feet swelling.  Interventions: Assisted patient with calling DSS to establish with Medicaid Transportation as well as coordinated medical appointment for concerns with swelling.  Discussed concerns of LCSW not being able to find a provider to assist patient due to her behavior.  Plan:  1. SW will follow up with patient by phone over the next week 2. Next PCP appointment scheduled for:  Monday Oct. 6th  3. LCSW will continue to call providers to see if any are willing to take patient 4. Will call [place] next week with patient to schedule assessment  5. Patient states she will work on not being so mean   Dr. Reita Chard has been informed of this outreach and plan   Casimer Lanius, [place] / [place]   [phone] 4:12 PM

## 2019-06-01 ENCOUNTER — Ambulatory Visit: Payer: Medicare Other

## 2019-06-02 ENCOUNTER — Telehealth: Payer: Self-pay | Admitting: Licensed Clinical Social Worker

## 2019-06-02 NOTE — Telephone Encounter (Signed)
   Unsuccessful Phone Outreach Note  06/02/2019 Name: [name] MRN: [id number] DOB: 1951/06/30  Reason for referral : Care Coordination (F/U)   [name] is a 68 y.o. year old female who sees Meccariello, Bernita Raisin, DO for primary care.   Patient dis not show for appointment Monday 06/01/19.Left message on voice mail she did not have transportation  Called patient to assess needs and barriers. Telephone outreach was unsuccessful. A HIPPA compliant phone message was left for the patient providing contact information and requesting a return call.  Plan:  If no return call is received. LCSW will call again within 24 to 48 hours. LCSW also continues to call PCS agencies to locate a new provider for patient. As of today unable to find a provider that will take patient.  Casimer Lanius, LCSW Clinical Social Worker [place] / [place]   [phone] 9:46 AM

## 2019-06-03 NOTE — Telephone Encounter (Signed)
     06/03/2019 Name: [name] MRN: [id number] DOB: 03-27-1951 All information obtained from front office staff  Patient returned call to [facility] upset that the office had called her. [place] office explained that LCSW was trying to contact her.  Patient did not want to make an appointment for her legs, says she is better.  [place] office provided contact information for patient to call LCSW.  Patient refused.  Plan: LCSW will call patient tomorrow in the late afternoon as she was agitated that LCSW called her at 11:00am.  Casimer Lanius, [place] / [place]   [phone] 2:19 PM

## 2019-06-03 NOTE — Telephone Encounter (Addendum)
Unsuccessful Phone Outreach Note  06/03/2019 Name: [name] MRN: [id number] DOB: 11-Oct-1950   Reason for referral : Care Coordination (F/U)   [name] is a 68 y.o. year old female who sees Meccariello, Bernita Raisin, DO for primary care. LCSW received call from [facility] with [place], they have agreed to take patient to assist with Personal Care needs.  2nd and 3rd unsuccessful telephone outreach attempt to [name] today to provide an update.  A HIPPA compliant phone message was left for the patient providing contact information and requesting a return call.  Left voice message with Demetrio Lapping [phone] resident manager at [facility] to see if she has seen or spoken with patient and asked for well check on patient.   Plan: LCSW will wait for return call, if no return call is received, will call  Ms. Grandville Silos or reach out to [name] in the next 24 to 48 hours.    Casimer Lanius, LCSW Clinical Social Worker [place] / [place]   [phone]

## 2019-06-04 NOTE — Telephone Encounter (Signed)
   Unsuccessful Phone Outreach Note  06/04/2019 Name: VIONA HOSKING MRN: [id number] DOB: [birth year]  Reason for referral : Care Coordination (F/U)   ERCIE ELIASEN is a 68 y.o. year old female who sees Meccariello, Bernita Raisin, DO for primary care. .  Called patient to assess needs and barriers reference assisting with personal care services referral. Telephone outreach was unsuccessful. A HIPPA compliant phone message was left for the patient providing contact information and requesting a return call.  Plan:  If no return call is received. LCSW will call again in 3 to 5 days.  Casimer Lanius, LCSW Clinical Social Worker [place] / [place]   [phone] 3:04 PM

## 2019-06-05 ENCOUNTER — Telehealth: Payer: Self-pay | Admitting: Licensed Clinical Social Worker

## 2019-06-05 NOTE — Telephone Encounter (Signed)
Care Coordination Follow up Phone Note Social Work   06/05/2019 Name: [name] MRN: [id number] DOB: 1950/09/22  [name] is a 68 y.o. year old female who sees Meccariello, Bernita Raisin, DO for primary care.  Reason for follow-up: phone encounter with patient today to assess needs and barriers for upcoming appointment with the wound center and to continue ongoing conversation with setting up [place]. LCSW has located [facility] who is willing to take patient for PCS if patient will call [facility] to schedule the assessment. Patient reports:She is doing well, she did not schedule the appointment at the wound center and she is not going. Patient states she does not want [facility] for her agency. Asked LCSW to stop calling her because she has a minutes phone and needs to save her minutes for when she really needs them. Assessment: Patient continues to refuse assistance with coordinating care for ongoing needs.  She did not show for last office visit at [facility]. Interventions: LCSW spoke with patient to offer support and education on the importance of going to the wound center appointment and completing the assessment for PCS services.  LCSW contact Dr. Janalyn Rouse office at the wound center, spoke with Vivien Rota to provide an update that patient was not coming to the appointment.  Will also provide an update to PCP.  Plan:  1. Patient will call [facility] to schedule assessment for personal care services. 2. Patient declined ongoing F/U care coordination from LCSW 3. No further follow up required: by LCSW at this time  4.   Inform PCP patient is refusing to go to the wound care appointment on 06/09/19.  PCP may want to call and talk to patient about going to the appoint or cancel the appointment at the wound center.   Dr. Sandi Carne has been informed of this outreach and plan and patient's refusal to go to wound appointment.  Casimer Lanius, LCSW Clinical  Social Worker [place] / [place]   [phone] 3:00 PM

## 2019-06-09 ENCOUNTER — Telehealth: Payer: Self-pay | Admitting: Family Medicine

## 2019-06-09 ENCOUNTER — Encounter: Payer: Self-pay | Admitting: Licensed Clinical Social Worker

## 2019-06-09 ENCOUNTER — Encounter (HOSPITAL_BASED_OUTPATIENT_CLINIC_OR_DEPARTMENT_OTHER): Payer: Medicare Other | Admitting: Internal Medicine
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
## 2019-06-09 NOTE — Telephone Encounter (Signed)
Spoke with Casimer Lanius, LCSW who notes that Medicaid transportation is already set up for patient.  No answer, left VM.  Advised patient that she can call Medicaid Transportation and it is already set up for her.  Needs three days notice.  Advised their number is [phone].  Also advised to call back to schedule an appointment with me for wound treatment.   The goal is to have patient come in to [facility] regularly to be able to provide her wound care since wound center and home health agencies are not an option for this patient at the present given previous dismissals and her unwillingness to go to the wound center.

## 2019-06-09 NOTE — Telephone Encounter (Signed)
Spoke with patient who notes that she will not go to the wound center due to a prior bad experience.  [place] and ensured that appointment was cancelled.  She had called [facility] and states that she didn't like the person she spoke to on the phone so she will not be using them.  Advised that her situation of wound care is very complicated given most Home Health agencies in the area have dismissed her.  She notes that she would be willing to come to [facility] regularly for wound care if she had transportation, as cost is a large issue for her.    [name], Can we work on trying to get her transportation to appointments so we can ensure she is getting the wound care that she needs?

## 2019-06-10 ENCOUNTER — Telehealth: Payer: Self-pay | Admitting: Family Medicine

## 2019-06-10 NOTE — Telephone Encounter (Signed)
Pt called requesting to get a call from MD regarding her legs. Best phone # to contact is [phone]

## 2019-06-12 NOTE — Telephone Encounter (Signed)
Spoke with patient regarding legs.  States that she had not heard from me about transportation and didn't receive my VM.  Advised of Medicaid Transportation number and that she needs to call three days in advance.  Advised to call to schedule an appointment to monitor her legs in early next week.  Told her if she is having fevers or worsening in wounds she should go to the ED.

## 2019-06-12 NOTE — Social Work (Signed)
  06/12/2019 Name: [name] MRN: [id number] DOB: [birth year] LCSW received phone call from [place].  States APS cannot force patient to go the doctor. Patient has capacity and has the right to make bad decisions or the right to say she does not want to go to the doctor if she chooses. APC case remains open at this time. Plan: No further follow up required: at this time . LCSW available if needed.  Dr. Reita Chard has been informed of this outreach and plan.  Casimer Lanius, LCSW Clinical Social Worker [place] / [place]   [phone] 3:43 PM

## 2019-06-18 ENCOUNTER — Emergency Department (HOSPITAL_COMMUNITY): Payer: Medicare Other

## 2019-06-18 ENCOUNTER — Inpatient Hospital Stay (HOSPITAL_COMMUNITY): Payer: Medicare Other

## 2019-06-18 ENCOUNTER — Encounter (HOSPITAL_COMMUNITY): Payer: Self-pay | Admitting: Emergency Medicine

## 2019-06-18 ENCOUNTER — Inpatient Hospital Stay (HOSPITAL_COMMUNITY)
Admission: EM | Admit: 2019-06-18 | Discharge: 2019-06-29 | DRG: 603 | Disposition: A | Discharge status: Skilled Nursing Facility | Payer: Medicare Other | Attending: Family Medicine | Admitting: Family Medicine

## 2019-06-18 ENCOUNTER — Other Ambulatory Visit: Payer: Self-pay

## 2019-06-18 DIAGNOSIS — Z90710 Acquired absence of both cervix and uterus: Secondary | ICD-10-CM

## 2019-06-18 DIAGNOSIS — Z23 Encounter for immunization: Secondary | ICD-10-CM

## 2019-06-18 DIAGNOSIS — D509 Iron deficiency anemia, unspecified: Secondary | ICD-10-CM | POA: Diagnosis present

## 2019-06-18 DIAGNOSIS — L97919 Non-pressure chronic ulcer of unspecified part of right lower leg with unspecified severity: Secondary | ICD-10-CM

## 2019-06-18 DIAGNOSIS — M792 Neuralgia and neuritis, unspecified: Secondary | ICD-10-CM | POA: Diagnosis present

## 2019-06-18 DIAGNOSIS — I83009 Varicose veins of unspecified lower extremity with ulcer of unspecified site: Secondary | ICD-10-CM

## 2019-06-18 DIAGNOSIS — Z8619 Personal history of other infectious and parasitic diseases: Secondary | ICD-10-CM

## 2019-06-18 DIAGNOSIS — I482 Chronic atrial fibrillation, unspecified: Secondary | ICD-10-CM | POA: Diagnosis present

## 2019-06-18 DIAGNOSIS — Z993 Dependence on wheelchair: Secondary | ICD-10-CM | POA: Diagnosis not present

## 2019-06-18 DIAGNOSIS — M1990 Unspecified osteoarthritis, unspecified site: Secondary | ICD-10-CM | POA: Diagnosis present

## 2019-06-18 DIAGNOSIS — Z886 Allergy status to analgesic agent status: Secondary | ICD-10-CM

## 2019-06-18 DIAGNOSIS — Z590 Homelessness: Secondary | ICD-10-CM

## 2019-06-18 DIAGNOSIS — Z9114 Patient's other noncompliance with medication regimen: Secondary | ICD-10-CM

## 2019-06-18 DIAGNOSIS — L03116 Cellulitis of left lower limb: Secondary | ICD-10-CM | POA: Diagnosis present

## 2019-06-18 DIAGNOSIS — F603 Borderline personality disorder: Secondary | ICD-10-CM | POA: Diagnosis present

## 2019-06-18 DIAGNOSIS — R32 Unspecified urinary incontinence: Secondary | ICD-10-CM | POA: Diagnosis present

## 2019-06-18 DIAGNOSIS — G894 Chronic pain syndrome: Secondary | ICD-10-CM | POA: Diagnosis present

## 2019-06-18 DIAGNOSIS — L97909 Non-pressure chronic ulcer of unspecified part of unspecified lower leg with unspecified severity: Secondary | ICD-10-CM

## 2019-06-18 DIAGNOSIS — Z5329 Procedure and treatment not carried out because of patient's decision for other reasons: Secondary | ICD-10-CM | POA: Diagnosis not present

## 2019-06-18 DIAGNOSIS — N183 Chronic kidney disease, stage 3 unspecified: Secondary | ICD-10-CM | POA: Diagnosis not present

## 2019-06-18 DIAGNOSIS — B871 Wound myiasis: Secondary | ICD-10-CM | POA: Diagnosis present

## 2019-06-18 DIAGNOSIS — L8990 Pressure ulcer of unspecified site, unspecified stage: Secondary | ICD-10-CM | POA: Insufficient documentation

## 2019-06-18 DIAGNOSIS — Z9119 Patient's noncompliance with other medical treatment and regimen: Secondary | ICD-10-CM | POA: Diagnosis not present

## 2019-06-18 DIAGNOSIS — F17210 Nicotine dependence, cigarettes, uncomplicated: Secondary | ICD-10-CM | POA: Diagnosis present

## 2019-06-18 DIAGNOSIS — Z20828 Contact with and (suspected) exposure to other viral communicable diseases: Secondary | ICD-10-CM | POA: Diagnosis present

## 2019-06-18 DIAGNOSIS — Z885 Allergy status to narcotic agent status: Secondary | ICD-10-CM

## 2019-06-18 DIAGNOSIS — L03115 Cellulitis of right lower limb: Secondary | ICD-10-CM | POA: Diagnosis present

## 2019-06-18 DIAGNOSIS — R7982 Elevated C-reactive protein (CRP): Secondary | ICD-10-CM | POA: Diagnosis not present

## 2019-06-18 DIAGNOSIS — Z598 Other problems related to housing and economic circumstances: Secondary | ICD-10-CM

## 2019-06-18 DIAGNOSIS — L0390 Cellulitis, unspecified: Secondary | ICD-10-CM

## 2019-06-18 DIAGNOSIS — G14 Postpolio syndrome: Secondary | ICD-10-CM | POA: Diagnosis present

## 2019-06-18 DIAGNOSIS — K5909 Other constipation: Secondary | ICD-10-CM | POA: Diagnosis present

## 2019-06-18 DIAGNOSIS — R456 Violent behavior: Secondary | ICD-10-CM | POA: Diagnosis not present

## 2019-06-18 DIAGNOSIS — F330 Major depressive disorder, recurrent, mild: Secondary | ICD-10-CM | POA: Diagnosis present

## 2019-06-18 DIAGNOSIS — N1831 Chronic kidney disease, stage 3a: Secondary | ICD-10-CM | POA: Diagnosis present

## 2019-06-18 DIAGNOSIS — R102 Pelvic and perineal pain: Secondary | ICD-10-CM | POA: Diagnosis not present

## 2019-06-18 DIAGNOSIS — E876 Hypokalemia: Secondary | ICD-10-CM | POA: Diagnosis not present

## 2019-06-18 DIAGNOSIS — I872 Venous insufficiency (chronic) (peripheral): Secondary | ICD-10-CM | POA: Diagnosis present

## 2019-06-18 DIAGNOSIS — Z79899 Other long term (current) drug therapy: Secondary | ICD-10-CM

## 2019-06-18 DIAGNOSIS — E1122 Type 2 diabetes mellitus with diabetic chronic kidney disease: Secondary | ICD-10-CM | POA: Diagnosis present

## 2019-06-18 LAB — CBC WITH DIFFERENTIAL/PLATELET
Abs Immature Granulocytes: 0.04 10*3/uL (ref 0.00–0.07)
Basophils Absolute: 0.1 10*3/uL (ref 0.0–0.1)
Basophils Relative: 1 %
Eosinophils Absolute: 0 10*3/uL (ref 0.0–0.5)
Eosinophils Relative: 0 %
HCT: 32.6 % — ABNORMAL LOW (ref 36.0–46.0)
Hemoglobin: 9.9 g/dL — ABNORMAL LOW (ref 12.0–15.0)
Immature Granulocytes: 0 %
Lymphocytes Relative: 9 %
Lymphs Abs: 0.9 10*3/uL (ref 0.7–4.0)
MCH: 27.7 pg (ref 26.0–34.0)
MCHC: 30.4 g/dL (ref 30.0–36.0)
MCV: 91.1 fL (ref 80.0–100.0)
Monocytes Absolute: 0.9 10*3/uL (ref 0.1–1.0)
Monocytes Relative: 9 %
Neutro Abs: 7.5 10*3/uL (ref 1.7–7.7)
Neutrophils Relative %: 81 %
Platelets: 393 10*3/uL (ref 150–400)
RBC: 3.58 MIL/uL — ABNORMAL LOW (ref 3.87–5.11)
RDW: 15.9 % — ABNORMAL HIGH (ref 11.5–15.5)
WBC: 9.4 10*3/uL (ref 4.0–10.5)
nRBC: 0 % (ref 0.0–0.2)

## 2019-06-18 LAB — COMPREHENSIVE METABOLIC PANEL
ALT: 21 U/L (ref 0–44)
AST: 32 U/L (ref 15–41)
Albumin: 2.6 g/dL — ABNORMAL LOW (ref 3.5–5.0)
Alkaline Phosphatase: 71 U/L (ref 38–126)
Anion gap: 13 (ref 5–15)
BUN: 22 mg/dL (ref 8–23)
CO2: 25 mmol/L (ref 22–32)
Calcium: 8.8 mg/dL — ABNORMAL LOW (ref 8.9–10.3)
Chloride: 99 mmol/L (ref 98–111)
Creatinine, Ser: 1.18 mg/dL — ABNORMAL HIGH (ref 0.44–1.00)
GFR calc Af Amer: 55 mL/min — ABNORMAL LOW (ref >60)
GFR calc non Af Amer: 47 mL/min — ABNORMAL LOW (ref >60)
Glucose, Bld: 167 mg/dL — ABNORMAL HIGH (ref 70–99)
Potassium: 3.2 mmol/L — ABNORMAL LOW (ref 3.5–5.1)
Sodium: 137 mmol/L (ref 135–145)
Total Bilirubin: 1.2 mg/dL (ref 0.3–1.2)
Total Protein: 7.4 g/dL (ref 6.5–8.1)

## 2019-06-18 LAB — SEDIMENTATION RATE: Sed Rate: 129 mm/hr — ABNORMAL HIGH (ref 0–22)

## 2019-06-18 LAB — C-REACTIVE PROTEIN: CRP: 21.8 mg/dL — ABNORMAL HIGH (ref <1.0)

## 2019-06-18 LAB — GLUCOSE, CAPILLARY: Glucose-Capillary: 129 mg/dL — ABNORMAL HIGH (ref 70–99)

## 2019-06-18 LAB — SARS CORONAVIRUS 2 (TAT 6-24 HRS): SARS Coronavirus 2: NEGATIVE

## 2019-06-18 LAB — LACTIC ACID, PLASMA: Lactic Acid, Venous: 1.4 mmol/L (ref 0.5–1.9)

## 2019-06-18 LAB — CBG MONITORING, ED: Glucose-Capillary: 109 mg/dL — ABNORMAL HIGH (ref 70–99)

## 2019-06-18 MED ORDER — METRONIDAZOLE IN NACL 5-0.79 MG/ML-% IV SOLN
500.0000 mg | Freq: Three times a day (TID) | INTRAVENOUS | Status: DC
  Administered 2019-06-18 – 2019-06-21 (×8): 500 mg via INTRAVENOUS
  Filled 2019-06-18 (×9): qty 100

## 2019-06-18 MED ORDER — METOPROLOL TARTRATE 12.5 MG HALF TABLET
12.5000 mg | ORAL_TABLET | Freq: Two times a day (BID) | ORAL | Status: DC
  Administered 2019-06-18 – 2019-06-27 (×17): 12.5 mg via ORAL
  Filled 2019-06-18 (×21): qty 1

## 2019-06-18 MED ORDER — SODIUM CHLORIDE 0.9 % IV BOLUS
500.0000 mL | Freq: Once | INTRAVENOUS | Status: AC
  Administered 2019-06-18: 500 mL via INTRAVENOUS

## 2019-06-18 MED ORDER — VANCOMYCIN HCL 10 G IV SOLR
1500.0000 mg | Freq: Once | INTRAVENOUS | Status: AC
  Administered 2019-06-18: 10:00:00 1500 mg via INTRAVENOUS
  Filled 2019-06-18: qty 1500

## 2019-06-18 MED ORDER — INSULIN ASPART 100 UNIT/ML ~~LOC~~ SOLN
0.0000 [IU] | Freq: Three times a day (TID) | SUBCUTANEOUS | Status: DC
  Administered 2019-06-19: 2 [IU] via SUBCUTANEOUS
  Administered 2019-06-20 – 2019-06-23 (×5): 1 [IU] via SUBCUTANEOUS

## 2019-06-18 MED ORDER — SODIUM CHLORIDE 0.9 % IV BOLUS
1000.0000 mL | Freq: Once | INTRAVENOUS | Status: AC
  Administered 2019-06-18: 1000 mL via INTRAVENOUS

## 2019-06-18 MED ORDER — ONDANSETRON HCL 4 MG PO TABS: 4.0000 mg | ORAL_TABLET | Freq: Four times a day (QID) | ORAL | Status: DC | PRN

## 2019-06-18 MED ORDER — ENOXAPARIN SODIUM 40 MG/0.4ML ~~LOC~~ SOLN
40.0000 mg | SUBCUTANEOUS | Status: DC
  Administered 2019-06-18 – 2019-06-29 (×12): 40 mg via SUBCUTANEOUS
  Filled 2019-06-18 (×12): qty 0.4

## 2019-06-18 MED ORDER — MORPHINE SULFATE (PF) 4 MG/ML IV SOLN
4.0000 mg | INTRAVENOUS | Status: DC | PRN
  Administered 2019-06-18: 14:00:00 4 mg via INTRAVENOUS
  Filled 2019-06-18: qty 1

## 2019-06-18 MED ORDER — GABAPENTIN 400 MG PO CAPS
400.0000 mg | ORAL_CAPSULE | Freq: Three times a day (TID) | ORAL | Status: DC
  Administered 2019-06-18 – 2019-06-19 (×6): 400 mg via ORAL
  Filled 2019-06-18 (×6): qty 1

## 2019-06-18 MED ORDER — DAKINS (1/4 STRENGTH) 0.125 % EX SOLN
Freq: Every day | CUTANEOUS | Status: DC
  Administered 2019-06-18: 23:00:00
  Filled 2019-06-18: qty 473

## 2019-06-18 MED ORDER — ONDANSETRON HCL 4 MG/2ML IJ SOLN: 4.0000 mg | Freq: Four times a day (QID) | INTRAMUSCULAR | Status: DC | PRN

## 2019-06-18 MED ORDER — MORPHINE SULFATE (PF) 4 MG/ML IV SOLN
4.0000 mg | Freq: Once | INTRAVENOUS | Status: AC
  Administered 2019-06-18: 09:00:00 4 mg via INTRAVENOUS
  Filled 2019-06-18: qty 1

## 2019-06-18 MED ORDER — POLYETHYLENE GLYCOL 3350 17 G PO PACK
17.0000 g | PACK | Freq: Every day | ORAL | Status: DC
  Administered 2019-06-18 – 2019-06-20 (×3): 17 g via ORAL
  Filled 2019-06-18 (×8): qty 1

## 2019-06-18 MED ORDER — MORPHINE SULFATE (PF) 2 MG/ML IV SOLN
2.0000 mg | INTRAVENOUS | Status: DC | PRN
  Administered 2019-06-18 – 2019-06-20 (×4): 2 mg via INTRAVENOUS
  Filled 2019-06-18 (×5): qty 1

## 2019-06-18 MED ORDER — METRONIDAZOLE IN NACL 5-0.79 MG/ML-% IV SOLN: 500.0000 mg | Freq: Three times a day (TID) | INTRAVENOUS | Status: DC

## 2019-06-18 MED ORDER — SODIUM CHLORIDE 0.9 % IV SOLN
2.0000 g | Freq: Two times a day (BID) | INTRAVENOUS | Status: DC
  Administered 2019-06-18 – 2019-06-21 (×6): 2 g via INTRAVENOUS
  Filled 2019-06-18 (×7): qty 2

## 2019-06-18 MED ORDER — VANCOMYCIN HCL IN DEXTROSE 1-5 GM/200ML-% IV SOLN
1000.0000 mg | INTRAVENOUS | Status: DC
  Administered 2019-06-19 – 2019-06-20 (×2): 1000 mg via INTRAVENOUS
  Filled 2019-06-18 (×3): qty 200

## 2019-06-18 MED ORDER — PIPERACILLIN-TAZOBACTAM 3.375 G IVPB 30 MIN
3.3750 g | Freq: Once | INTRAVENOUS | Status: AC
  Administered 2019-06-18: 10:00:00 3.375 g via INTRAVENOUS
  Filled 2019-06-18: qty 50
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
## 2019-06-18 NOTE — ED Notes (Signed)
Tele ordered lunch 

## 2019-06-18 NOTE — Progress Notes (Signed)
Pt arrived to unit. Crying and stating " I want to beat all of you for making my legs hurt". Pt stated she had pain 10/10 in right lower extremity. RN offered pain medication and something to eat and patient refused. RN has attempted to look at wound and administer pain medication however patient continues to refuse and threaten nursing staff. Will continue to monitor and educate pt.

## 2019-06-18 NOTE — ED Notes (Signed)
ED TO INPATIENT HANDOFF REPORT  ED Nurse Name and Phone #: Aundra Millet, #[phone]  S Name/Age/Gender [name] 68 y.o. female Room/Bed: 025C/025C  Code Status   Code Status: Full Code  Home/SNF/Other Home Patient oriented to: self, place, time and situation Is this baseline? Yes   Triage Complete: Triage complete  Chief Complaint sick  Triage Note Patient arrived with EMS from home reports worsening pain at buttocks and vagina for several days , denies injury or fall , EMS witnessed poor living condition in her apartment with roaches , bed bugs and maggots at buttocks. Patient added deep wound at right calf for several weeks.    Allergies Allergies  Allergen Reactions  . Codeine Other (See Comments)    "Gets high"  . Tylenol [Acetaminophen] Hives    Level of Care/Admitting Diagnosis ED Disposition    ED Disposition Condition Comment   Admit  Hospital Area: [facility] [[id number]]  Level of Care: Telemetry Cardiac [103]  Covid Evaluation: Asymptomatic Screening Protocol (No Symptoms)  Diagnosis: Recurrent cellulitis of lower extremity [1062694]  Admitting Physician: Allayne Stack [[id number]]  Attending Physician: Moses Manners [[id number]]  Estimated length of stay: 5 - 7 days  Certification:: I certify this patient will need inpatient services for at least 2 midnights  PT Class (Do Not Modify): Inpatient [101]  PT Acc Code (Do Not Modify): Private [1]       B Medical/Surgery History Past Medical History:  Diagnosis Date  . Arthritis   . Hypoglycemia   . Pneumonia, pneumococcal [facility])    Past Surgical History:  Procedure Laterality Date  . ABDOMINAL HYSTERECTOMY    . CESAREAN SECTION       A IV Location/Drains/Wounds Patient Lines/Drains/Airways Status   Active Line/Drains/Airways    Name:   Placement date:   Placement time:   Site:   Days:   Peripheral IV 06/18/19 Left Antecubital   06/18/19    0738    Antecubital   less than 1   External  Urinary Catheter   05/07/19    2001    -   42   Pressure Injury 05/14/19 Buttocks Right Stage II -  Partial thickness loss of dermis presenting as a shallow open ulcer with a red, pink wound bed without slough.   05/14/19    2300     35   Pressure Injury 05/14/19 Buttocks Right Stage II -  Partial thickness loss of dermis presenting as a shallow open ulcer with a red, pink wound bed without slough.   05/14/19    2300     35   Wound / Incision (Open or Dehisced) 04/18/18 Other (Comment) Leg Right;Lower partial thickness, wraps around entire circumfrence of calf. Wound is yellow, gray, pink borders, oozing wtih odor.   04/18/18    1215    Leg   426   Wound / Incision (Open or Dehisced) 05/04/19 Venous stasis ulcer Pretibial Left;Right;Lower venous stasis ulcer   05/04/19    1900    Pretibial   45   Wound / Incision (Open or Dehisced) 06/18/19 Venous stasis ulcer Pretibial Right;Lateral chronic non healing ulceration   06/18/19    1318    Pretibial   less than 1   Wound / Incision (Open or Dehisced) 06/18/19 Venous stasis ulcer Pretibial Left;Medial chronic non healing ulceration   06/18/19    1318    Pretibial   less than 1  Intake/Output Last 24 hours  Intake/Output Summary (Last 24 hours) at 06/18/2019 1749 Last data filed at 06/18/2019 1515 Gross per 24 hour  Intake 1600 ml  Output -  Net 1600 ml    Labs/Imaging Results for orders placed or performed during the hospital encounter of 06/18/19 (from the past 48 hour(s))  Lactic acid, plasma     Status: None   Collection Time: 06/18/19  8:02 AM  Result Value Ref Range   Lactic Acid, Venous 1.4 0.5 - 1.9 mmol/L    Comment: Performed at [facility], [address], [place], [place] [address]  CBC with Differential     Status: Abnormal   Collection Time: 06/18/19  8:02 AM  Result Value Ref Range   WBC 9.4 4.0 - 10.5 K/uL   RBC 3.58 (L) 3.87 - 5.11 MIL/uL   Hemoglobin 9.9 (L) 12.0 - 15.0 g/dL   HCT 60.4 (L) 54.0 - 98.1 %    MCV 91.1 80.0 - 100.0 fL   MCH 27.7 26.0 - 34.0 pg   MCHC 30.4 30.0 - 36.0 g/dL   RDW 19.1 (H) 47.8 - 29.5 %   Platelets 393 150 - 400 K/uL   nRBC 0.0 0.0 - 0.2 %   Neutrophils Relative % 81 %   Neutro Abs 7.5 1.7 - 7.7 K/uL   Lymphocytes Relative 9 %   Lymphs Abs 0.9 0.7 - 4.0 K/uL   Monocytes Relative 9 %   Monocytes Absolute 0.9 0.1 - 1.0 K/uL   Eosinophils Relative 0 %   Eosinophils Absolute 0.0 0.0 - 0.5 K/uL   Basophils Relative 1 %   Basophils Absolute 0.1 0.0 - 0.1 K/uL   Immature Granulocytes 0 %   Abs Immature Granulocytes 0.04 0.00 - 0.07 K/uL    Comment: Performed at [facility], [address], [place], [place] [address]  Sedimentation rate     Status: Abnormal   Collection Time: 06/18/19  8:02 AM  Result Value Ref Range   Sed Rate 129 (H) 0 - 22 mm/hr    Comment: Performed at [facility], [address], [place], [place] [address]  C-reactive protein     Status: Abnormal   Collection Time: 06/18/19  8:02 AM  Result Value Ref Range   CRP 21.8 (H) <1.0 mg/dL    Comment: Performed at [facility], [address] [place], [place] [address]  Comprehensive metabolic panel     Status: Abnormal   Collection Time: 06/18/19  8:02 AM  Result Value Ref Range   Sodium 137 135 - 145 mmol/L   Potassium 3.2 (L) 3.5 - 5.1 mmol/L   Chloride 99 98 - 111 mmol/L   CO2 25 22 - 32 mmol/L   Glucose, Bld 167 (H) 70 - 99 mg/dL   BUN 22 8 - 23 mg/dL   Creatinine, Ser 9.52 (H) 0.44 - 1.00 mg/dL   Calcium 8.8 (L) 8.9 - 10.3 mg/dL   Total Protein 7.4 6.5 - 8.1 g/dL   Albumin 2.6 (L) 3.5 - 5.0 g/dL   AST 32 15 - 41 U/L   ALT 21 0 - 44 U/L   Alkaline Phosphatase 71 38 - 126 U/L   Total Bilirubin 1.2 0.3 - 1.2 mg/dL   GFR calc non Af Amer 47 (L) >60 mL/min   GFR calc Af Amer 55 (L) >60 mL/min   Anion gap 13 5 - 15    Comment: Performed at [facility]  Lab, [address] [place], [place] [address]  SARS CORONAVIRUS 2 (TAT 6-24 HRS) Nasopharyngeal  Nasopharyngeal Swab     Status: None   Collection Time: 06/18/19  9:36 AM   Specimen: Nasopharyngeal Swab  Result Value Ref Range   SARS Coronavirus 2 NEGATIVE NEGATIVE    Comment: (NOTE) SARS-CoV-2 target nucleic acids are NOT DETECTED. The SARS-CoV-2 RNA is generally detectable in upper and lower respiratory specimens during the acute phase of infection. Negative results do not preclude SARS-CoV-2 infection, do not rule out co-infections with other pathogens, and should not be used as the sole basis for treatment or other patient management decisions. Negative results must be combined with clinical observations, patient history, and epidemiological information. The expected result is Negative. Fact Sheet for Patients: SugarRoll.be Fact Sheet for Healthcare Providers: https://www.woods-mathews.com/ This test is not yet approved or cleared by the [place] FDA and  has been authorized for detection and/or diagnosis of SARS-CoV-2 by FDA under an Emergency Use Authorization (EUA). This EUA will remain  in effect (meaning this test can be used) for the duration of the COVID-19 declaration under Section 56 4(b)(1) of the Act, 21 U.S.C. section 360bbb-3(b)(1), unless the authorization is terminated or revoked sooner. Performed at [facility], [place] [address] [place], [place] [address]   CBG monitoring, ED     Status: Abnormal   Collection Time: 06/18/19  5:07 PM  Result Value Ref Range   Glucose-Capillary 109 (H) 70 - 99 mg/dL   Dg Chest Portable 1 View  Result Date: 06/18/2019 CLINICAL DATA:  Patient to ED via EMS, reports worsening pain at buttocks and vagina for several days, denies injury or fall, EMS witnessed poor living condition in her apartment with roaches, bed bugs and maggots at buttocks. Patient added deep.*comment was truncated*shortness of breath EXAM: PORTABLE CHEST 1 VIEW COMPARISON:  None. FINDINGS: Normal  mediastinum and cardiac silhouette. Normal pulmonary vasculature. No evidence of effusion, infiltrate, or pneumothorax. No acute bony abnormality. Remote LEFT humeral fracture IMPRESSION: No acute cardiopulmonary process. Electronically Signed   By: Suzy Bouchard M.D.   On: 06/18/2019 08:14   Dg Tibia/fibula Right Port  Result Date: 06/18/2019 CLINICAL DATA:  Leg ulcer EXAM: PORTABLE RIGHT TIBIA AND FIBULA - 2 VIEW COMPARISON:  05/04/2019 FINDINGS: Separate AP views of the proximal and distal RIGHT lower leg. Patient uncooperative for additional imaging, no lateral view obtained. Osseous demineralization. Knee and ankle joint spaces preserved. Diffuse soft tissue swelling of the RIGHT lower leg with large area of soft tissue irregularity and ulceration at the lateral aspect of the distal RIGHT lower leg. No definite fracture, dislocation or bone destruction identified on limited assessment. IMPRESSION: Soft tissue swelling without definite acute bony abnormalities on with assessment. Soft tissue ulcer at lateral aspect of distal RIGHT lower leg. Electronically Signed   By: Lavonia Dana M.D.   On: 06/18/2019 12:32    Pending Labs Unresulted Labs (From admission, onward)    Start     Ordered   06/25/19 0500  Creatinine, serum  (enoxaparin (LOVENOX)    CrCl >/= 30 ml/min)  Weekly,   R    Comments: while on enoxaparin therapy    06/18/19 1100   06/19/19 0500  CBC with Differential/Platelet  Tomorrow morning,   R     06/18/19 1249   06/19/19 0500  Comprehensive metabolic panel  Tomorrow morning,   R     06/18/19 1249   06/18/19 0703  Blood culture (routine x  2)  BLOOD CULTURE X 2,   STAT     06/18/19 0704          Vitals/Pain Today's Vitals   06/18/19 1433 06/18/19 1439 06/18/19 1600 06/18/19 1749  BP:  (!) 137/52 119/68   Pulse: 89  93   Resp:  20 17   Temp:      TempSrc:      SpO2: 93%  99%   Weight:      Height:      PainSc:    4     Isolation Precautions Contact  precautions  Medications Medications  gabapentin (NEURONTIN) capsule 400 mg (400 mg Oral Given 06/18/19 1726)  enoxaparin (LOVENOX) injection 40 mg (40 mg Subcutaneous Given 06/18/19 1208)  polyethylene glycol (MIRALAX / GLYCOLAX) packet 17 g (has no administration in time range)  ondansetron (ZOFRAN) tablet 4 mg (has no administration in time range)    Or  ondansetron (ZOFRAN) injection 4 mg (has no administration in time range)  vancomycin (VANCOCIN) IVPB 1000 mg/200 mL premix (has no administration in time range)  metroNIDAZOLE (FLAGYL) IVPB 500 mg (500 mg Intravenous New Bag/Given 06/18/19 1737)  metoprolol tartrate (LOPRESSOR) tablet 12.5 mg (12.5 mg Oral Given 06/18/19 1342)  ceFEPIme (MAXIPIME) 2 g in sodium chloride 0.9 % 100 mL IVPB (2 g Intravenous New Bag/Given 06/18/19 1736)  insulin aspart (novoLOG) injection 0-9 Units (0 Units Subcutaneous Not Given 06/18/19 1738)  sodium hypochlorite (DAKIN'S 1/4 STRENGTH) topical solution (has no administration in time range)  morphine 2 MG/ML injection 2 mg (has no administration in time range)  sodium chloride 0.9 % bolus 1,000 mL (0 mLs Intravenous Stopped 06/18/19 0937)  vancomycin (VANCOCIN) 1,500 mg in sodium chloride 0.9 % 500 mL IVPB (0 mg Intravenous Stopped 06/18/19 1248)  piperacillin-tazobactam (ZOSYN) IVPB 3.375 g (0 g Intravenous Stopped 06/18/19 0953)  morphine 4 MG/ML injection 4 mg (4 mg Intravenous Given 06/18/19 0928)  sodium chloride 0.9 % bolus 500 mL (0 mLs Intravenous Stopped 06/18/19 1515)    Mobility walks Moderate fall risk(pt has significant infection/cellulitis bilaterally, open wound on right shin/calf)   Focused Assessments Skin Assessment    R Recommendations: See Admitting Provider Note  Report given to:   Additional Notes:
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
## 2019-06-18 NOTE — ED Provider Notes (Addendum)
[facility] Provider Note   CSN: [id number] Arrival date & time: 06/18/19  0636     History   Chief Complaint Chief Complaint  Patient presents with  . Buttocks/Vaginal Pain    HPI [name] is a 68 y.o. female.  Presents emerged department with chief complaint of bilateral leg pain as well as buttocks pain.  She Phillips that she hurts all over.  Phillips she feels miserable from her pain, Phillips pain is in severity, "all over, worse in her legs and her buttocks.  Phillips that she has been taking her medicines as prescribed, including an antibiotic that she is unsure of what she is taking.  Denies any fevers, chills, chest pain, difficulty breathing, abdominal pain, nausea vomiting or diarrhea.  She thinks the area on her legs has been improving lately.     HPI  Past Medical History:  Diagnosis Date  . Arthritis   . Hypoglycemia   . Pneumonia, pneumococcal [facility](HCC)     Patient Active Problem List   Diagnosis Date Noted  . Adjustment disorder with mixed disturbance of emotions and conduct   . Type 2 diabetes mellitus with hyperglycemia (HCC) 05/04/2019  . AKI (acute kidney injury) (HCC)   . Infected ulcer of skin (HCC)   . High risk social situation   . Post-polio syndrome   . Chronic pain syndrome   . Limited mobility 04/20/2019  . Venous stasis ulcer (HCC) 03/31/2019  . Overgrown toenails 10/28/2018  . Wound of right leg 02/06/2018  . Atrial fibrillation (HCC) 01/03/2018  . Anemia 01/03/2018  . Elevated serum creatinine 01/03/2018  . Bipolar I disorder with mania (HCC) 01/03/2018    Past Surgical History:  Procedure Laterality Date  . ABDOMINAL HYSTERECTOMY    . CESAREAN SECTION       OB History    Gravida      Para      Term      Preterm      AB      Living  3     SAB      TAB      Ectopic      Multiple      Live Births               Home Medications    Prior to Admission medications    Medication Sig Start Date End Date Taking? Authorizing Provider  DULoxetine (CYMBALTA) 30 MG capsule Take 1 capsule (30 mg total) by mouth daily. 05/23/19   Mullis, Kiersten P, DO  ferrous sulfate 325 (65 FE) MG tablet Take 1 tablet (325 mg total) by mouth daily with breakfast. 05/23/19   Mullis, Kiersten P, DO  gabapentin (NEURONTIN) 400 MG capsule Take 1 capsule (400 mg total) by mouth 3 (three) times daily. 05/22/19   Mullis, Kiersten P, DO  traZODone (DESYREL) 50 MG tablet Take 1 tablet (50 mg total) by mouth at bedtime. 05/22/19   Mullis, Kiersten P, DO  furosemide (LASIX) 20 MG tablet Take 0.5 tablets (10 mg total) by mouth daily. Patient not taking: Reported on 04/18/2018 12/10/17 05/04/19  Arthor CaptainHarris, Abigail, PA-C    Family History No family history on file.  Social History Social History   Tobacco Use  . Smoking status: Heavy Tobacco Smoker    Packs/day: 0.50  . Smokeless tobacco: Never Used  Substance Use Topics  . Alcohol use: No  . Drug use: No     Allergies   Codeine  and Tylenol [acetaminophen]   Review of Systems Review of Systems  Constitutional: Positive for fatigue. Negative for chills and fever.  HENT: Negative for ear pain and sore throat.   Eyes: Negative for pain and visual disturbance.  Respiratory: Negative for cough and shortness of breath.   Cardiovascular: Negative for chest pain and palpitations.  Gastrointestinal: Negative for abdominal pain and vomiting.  Genitourinary: Negative for dysuria and hematuria.  Musculoskeletal: Positive for arthralgias and myalgias. Negative for back pain.  Skin: Positive for rash. Negative for color change.  Neurological: Negative for seizures and syncope.  All other systems reviewed and are negative.    Physical Exam Updated Vital Signs BP 137/78 (BP Location: Right Arm)   Pulse (!) 105   Temp 100 F (37.8 C) (Rectal)   Resp 20   Ht 5\' 6"  (1.676 m)   Wt 77.1 kg   SpO2 99%   BMI 27.44 kg/m   Physical Exam  Vitals signs and nursing note reviewed.  Constitutional:      General: She is not in acute distress.    Appearance: She is well-developed.     Comments: Disheveled, foul-smelling  HENT:     Head: Normocephalic and atraumatic.  Eyes:     Conjunctiva/sclera: Conjunctivae normal.  Neck:     Musculoskeletal: Neck supple.  Cardiovascular:     Rate and Rhythm: Regular rhythm. Tachycardia present.     Heart sounds: No murmur.  Pulmonary:     Effort: Pulmonary effort is normal. No respiratory distress.     Breath sounds: Normal breath sounds.  Abdominal:     Palpations: Abdomen is soft.     Tenderness: There is no abdominal tenderness.  Musculoskeletal:     Comments: Bilateral lower leg chronic venous stasis ulcers, significant overlying erythema bilaterally that extends from feet to lower knee, no induration, no crepitus appreciated, there is moderate purulent drainage from ulcer on right leg Buttocks: Generalized erythema, skin irritation surrounding buttocks, no induration or swelling noted  Skin:    Comments: See rash as above    Neurological:     General: No focal deficit present.     Mental Status: She is alert. Mental status is at baseline.  Psychiatric:     Comments: Somewhat agitated      ED Treatments / Results  Labs (all labs ordered are listed, but only abnormal results are displayed) Labs Reviewed  CBC WITH DIFFERENTIAL/PLATELET - Abnormal; Notable for the following components:      Result Value   RBC 3.58 (*)    Hemoglobin 9.9 (*)    HCT 32.6 (*)    RDW 15.9 (*)    All other components within normal limits  C-REACTIVE PROTEIN - Abnormal; Notable for the following components:   CRP 21.8 (*)    All other components within normal limits  COMPREHENSIVE METABOLIC PANEL - Abnormal; Notable for the following components:   Potassium 3.2 (*)    Glucose, Bld 167 (*)    Creatinine, Ser 1.18 (*)    Calcium 8.8 (*)    Albumin 2.6 (*)    GFR calc non Af Amer 47 (*)     GFR calc Af Amer 55 (*)    All other components within normal limits  CULTURE, BLOOD (ROUTINE X 2)  CULTURE, BLOOD (ROUTINE X 2)  SARS CORONAVIRUS 2 (TAT 6-24 HRS)  LACTIC ACID, PLASMA  LACTIC ACID, PLASMA  SEDIMENTATION RATE  URINALYSIS, ROUTINE W REFLEX MICROSCOPIC    EKG EKG Interpretation  Date/Time:  Thursday June 18 2019 07:32:04 EDT Ventricular Rate:  112 PR Interval:    QRS Duration: 71 QT Interval:  297 QTC Calculation: 406 R Axis:   63 Text Interpretation:  poor data quality, most likely atrial fibrilation no significant ST segment changes When compared to prior ECG on 05/07/2019, ventricular rate has increased Confirmed by Marianna Fuss ([id number]) on 06/18/2019 8:38:12 AM   Radiology Dg Chest Portable 1 View  Result Date: 06/18/2019 CLINICAL DATA:  Patient to ED via EMS, reports worsening pain at buttocks and vagina for several days, denies injury or fall, EMS witnessed poor living condition in her apartment with roaches, bed bugs and maggots at buttocks. Patient added deep.*comment was truncated*shortness of breath EXAM: PORTABLE CHEST 1 VIEW COMPARISON:  None. FINDINGS: Normal mediastinum and cardiac silhouette. Normal pulmonary vasculature. No evidence of effusion, infiltrate, or pneumothorax. No acute bony abnormality. Remote LEFT humeral fracture IMPRESSION: No acute cardiopulmonary process. Electronically Signed   By: Genevive Bi M.D.   On: 06/18/2019 08:14    Procedures .Critical Care Performed by: Caroline Loll, MD Authorized by: Caroline Loll, MD   Critical care provider statement:    Critical care time (minutes):  36   Critical care was necessary to treat or prevent imminent or life-threatening deterioration of the following conditions:  Sepsis   Critical care was time spent personally by me on the following activities:  Discussions with consultants, evaluation of patient's response to treatment, examination of patient, ordering and  performing treatments and interventions, ordering and review of laboratory studies, ordering and review of radiographic studies, pulse oximetry, re-evaluation of patient's condition, obtaining history from patient or surrogate and review of old charts   (including critical care time)  Medications Ordered in ED Medications  vancomycin (VANCOCIN) 1,500 mg in sodium chloride 0.9 % 500 mL IVPB (1,500 mg Intravenous New Bag/Given 06/18/19 0941)  sodium chloride 0.9 % bolus 1,000 mL (0 mLs Intravenous Stopped 06/18/19 0937)  piperacillin-tazobactam (ZOSYN) IVPB 3.375 g (0 g Intravenous Stopped 06/18/19 0953)  morphine 4 MG/ML injection 4 mg (4 mg Intravenous Given 06/18/19 0928)     Initial Impression / Assessment and Plan / ED Course  I have reviewed the triage vital signs and the nursing notes.  Pertinent labs & imaging results that were available during my care of the patient were reviewed by me and considered in my medical decision making (see chart for details).  Clinical Course as of Jun 17 1016  Thu Jun 18, 2019  0800 Completed initial assessment, will get cultures, labs, start empiric antibiotics for cellulitis of the lower extremities   [RD]  0945 Discussed case with the family medicine resident team who will come assess patient for admission   [RD]  1016 CRP(!): 21.8 [RD]    Clinical Course User Index [RD] Caroline Loll, MD       68-year lady who presents to ER with worsening lower extremity wounds.  The based on exam, concern for progression of cellulitis related to her chronic venous stasis ulcers.  Recent admission for similar.  Patient noted to be mildly tachycardic, borderline febrile.  Stable blood pressure.  No crepitus, no induration or abscess was identified on legs or right buttocks.  Started empiric Vanco, Zosyn, obtain blood cultures and consulted the family practice team for admission.  Discussed case with Dr. Annia Friendly who agrees to admit patient.  Final Clinical  Impressions(s) / ED Diagnoses   Final diagnoses:  Cellulitis of right lower extremity  Cellulitis of left lower extremity  Venous stasis ulcer, unspecified site, unspecified ulcer stage, unspecified whether varicose veins present (HCC)  Elevated C-reactive protein (CRP)    ED Discharge Orders    None       Caroline Starch, MD 06/18/19 1018    Caroline Starch, MD 07/01/19 0000

## 2019-06-18 NOTE — Progress Notes (Signed)
Pharmacy Antibiotic Note  [name] is a 68 y.o. female admitted on 06/18/2019 with wound infection.  Pharmacy has been consulted for cefepime dosing.  Pt switched from zosyn to cefepime due to risk of AKI. WBC is wnls at 9.4. CRP is elevated at 21.8, ESR is elevated at 129. Rectal temp was slightly elevated at 100 degrees.  Plan: Cefepime 2g IV q 12 hours Continue vancomycin 1000 mg q 24 hours Continue metronidazole 500 mg q8 hours per MD  Height: 5' 6" (167.6 cm) Weight: 170 lb (77.1 kg) IBW/kg (Calculated) : 59.3  Temp (24hrs), Avg:99.8 F (37.7 C), Min:99.5 F (37.5 C), Max:100 F (37.8 C)  Recent Labs  Lab 06/18/19 0802  WBC 9.4  CREATININE 1.18*  LATICACIDVEN 1.4    Estimated Creatinine Clearance: 47.8 mL/min (A) (by C-G formula based on SCr of 1.18 mg/dL (H)).    Allergies  Allergen Reactions  . Codeine Other (See Comments)    "Gets high"  . Tylenol [Acetaminophen] Hives    Antimicrobials this admission: Vancomycin 10/22 >> Zosyn 10/22 x 1 Cefepime 10/22 >> Metronidazole 10/22 >>  Microbiology results: 10/22 BCx: sent 10/22 COVID: sent  Thank you for allowing pharmacy to be a part of this patient's care.  Sherren Kerns, PharmD PGY1 Acute Care Pharmacy Resident 06/18/2019 1:02 PM
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
## 2019-06-18 NOTE — Progress Notes (Addendum)
Pharmacy Antibiotic Note  [name] is a 68 y.o. female admitted on 06/18/2019 with cellulitis.  Pharmacy has been consulted for Vancomycin and Zosyn dosing.    Temp (24hrs), Avg:99.8 F (37.7 C), Min:99.5 F (37.5 C), Max:100 F (37.8 C)  No results for input(s): WBC, CREATININE, LATICACIDVEN, VANCOTROUGH, VANCOPEAK, VANCORANDOM, GENTTROUGH, GENTPEAK, GENTRANDOM, TOBRATROUGH, TOBRAPEAK, TOBRARND, AMIKACINPEAK, AMIKACINTROU, AMIKACIN in the last 168 hours.  CrCl cannot be calculated (Patient's most recent lab result is older than the maximum 21 days allowed.).    Allergies  Allergen Reactions  . Codeine Other (See Comments)    "Gets high"  . Tylenol [Acetaminophen] Hives    Antimicrobials this admission: 10/22 Vancomycin >>  10/22 Zosyn  >>    Microbiology results: N/a  Plan: - Will give the patient a one time dose of vancomycin 1500 mg  - Will continue with vancomycin 1000 mg IV q24h with an estimated AUC of 452 - Will start zosyn 3.375g IV every 8 hours infused over 4 hours   Thank you for allowing pharmacy to be a part of this patient's care.  Duanne Limerick 06/18/2019 7:46 AM

## 2019-06-18 NOTE — ED Notes (Signed)
PureWick placed.

## 2019-06-18 NOTE — ED Notes (Signed)
Pt's tray at bedside 

## 2019-06-18 NOTE — Consult Note (Signed)
These ulcerations are not related to pressure, therefore would not be Staged.  A[place], [place], [place]

## 2019-06-19 ENCOUNTER — Inpatient Hospital Stay (HOSPITAL_COMMUNITY): Payer: Medicare Other

## 2019-06-19 DIAGNOSIS — N183 Chronic kidney disease, stage 3 unspecified: Secondary | ICD-10-CM | POA: Diagnosis not present

## 2019-06-19 DIAGNOSIS — R7982 Elevated C-reactive protein (CRP): Secondary | ICD-10-CM | POA: Diagnosis not present

## 2019-06-19 DIAGNOSIS — L03116 Cellulitis of left lower limb: Principal | ICD-10-CM

## 2019-06-19 DIAGNOSIS — I482 Chronic atrial fibrillation, unspecified: Secondary | ICD-10-CM | POA: Diagnosis not present

## 2019-06-19 DIAGNOSIS — R102 Pelvic and perineal pain: Secondary | ICD-10-CM | POA: Diagnosis not present

## 2019-06-19 DIAGNOSIS — L03115 Cellulitis of right lower limb: Secondary | ICD-10-CM | POA: Diagnosis not present

## 2019-06-19 DIAGNOSIS — L8990 Pressure ulcer of unspecified site, unspecified stage: Secondary | ICD-10-CM | POA: Insufficient documentation

## 2019-06-19 DIAGNOSIS — L97909 Non-pressure chronic ulcer of unspecified part of unspecified lower leg with unspecified severity: Secondary | ICD-10-CM

## 2019-06-19 LAB — CBC WITH DIFFERENTIAL/PLATELET
Abs Immature Granulocytes: 0.02 10*3/uL (ref 0.00–0.07)
Basophils Absolute: 0.1 10*3/uL (ref 0.0–0.1)
Basophils Relative: 1 %
Eosinophils Absolute: 0.3 10*3/uL (ref 0.0–0.5)
Eosinophils Relative: 6 %
HCT: 28.4 % — ABNORMAL LOW (ref 36.0–46.0)
Hemoglobin: 8.7 g/dL — ABNORMAL LOW (ref 12.0–15.0)
Immature Granulocytes: 0 %
Lymphocytes Relative: 21 %
Lymphs Abs: 1.1 10*3/uL (ref 0.7–4.0)
MCH: 28 pg (ref 26.0–34.0)
MCHC: 30.6 g/dL (ref 30.0–36.0)
MCV: 91.3 fL (ref 80.0–100.0)
Monocytes Absolute: 0.6 10*3/uL (ref 0.1–1.0)
Monocytes Relative: 11 %
Neutro Abs: 3.3 10*3/uL (ref 1.7–7.7)
Neutrophils Relative %: 61 %
Platelets: 281 10*3/uL (ref 150–400)
RBC: 3.11 MIL/uL — ABNORMAL LOW (ref 3.87–5.11)
RDW: 16.1 % — ABNORMAL HIGH (ref 11.5–15.5)
WBC: 5.3 10*3/uL (ref 4.0–10.5)
nRBC: 0 % (ref 0.0–0.2)

## 2019-06-19 LAB — GLUCOSE, CAPILLARY
Glucose-Capillary: 32 mg/dL — CL (ref 70–99)
Glucose-Capillary: 126 mg/dL — ABNORMAL HIGH (ref 70–99)
Glucose-Capillary: 158 mg/dL — ABNORMAL HIGH (ref 70–99)
Glucose-Capillary: 107 mg/dL — ABNORMAL HIGH (ref 70–99)
Glucose-Capillary: 139 mg/dL — ABNORMAL HIGH (ref 70–99)

## 2019-06-19 LAB — MAGNESIUM: Magnesium: 1.9 mg/dL (ref 1.7–2.4)

## 2019-06-19 LAB — COMPREHENSIVE METABOLIC PANEL
ALT: 17 U/L (ref 0–44)
AST: 25 U/L (ref 15–41)
Albumin: 2 g/dL — ABNORMAL LOW (ref 3.5–5.0)
Alkaline Phosphatase: 52 U/L (ref 38–126)
Anion gap: 9 (ref 5–15)
BUN: 16 mg/dL (ref 8–23)
CO2: 23 mmol/L (ref 22–32)
Calcium: 8.2 mg/dL — ABNORMAL LOW (ref 8.9–10.3)
Chloride: 106 mmol/L (ref 98–111)
Creatinine, Ser: 1.01 mg/dL — ABNORMAL HIGH (ref 0.44–1.00)
GFR calc Af Amer: >60 mL/min (ref >60)
GFR calc non Af Amer: 57 mL/min — ABNORMAL LOW (ref >60)
Glucose, Bld: 125 mg/dL — ABNORMAL HIGH (ref 70–99)
Potassium: 3 mmol/L — ABNORMAL LOW (ref 3.5–5.1)
Sodium: 138 mmol/L (ref 135–145)
Total Bilirubin: 0.8 mg/dL (ref 0.3–1.2)
Total Protein: 6 g/dL — ABNORMAL LOW (ref 6.5–8.1)

## 2019-06-19 MED ORDER — BACID PO TABS
2.0000 | ORAL_TABLET | Freq: Three times a day (TID) | ORAL | Status: DC
  Administered 2019-06-19 – 2019-06-29 (×27): 2 via ORAL
  Filled 2019-06-19 (×33): qty 2

## 2019-06-19 MED ORDER — JUVEN PO PACK
1.0000 | PACK | Freq: Two times a day (BID) | ORAL | Status: DC
  Administered 2019-06-19 – 2019-06-25 (×8): 1 via ORAL
  Filled 2019-06-19 (×11): qty 1

## 2019-06-19 MED ORDER — DAKINS (1/4 STRENGTH) 0.125 % EX SOLN
Freq: Every day | CUTANEOUS | Status: DC
  Administered 2019-06-20 – 2019-06-22 (×2)
  Filled 2019-06-19: qty 473

## 2019-06-19 MED ORDER — GERHARDT'S BUTT CREAM
TOPICAL_CREAM | Freq: Two times a day (BID) | CUTANEOUS | Status: DC
  Administered 2019-06-20 – 2019-06-26 (×13): via TOPICAL
  Administered 2019-06-27: 1 via TOPICAL
  Administered 2019-06-27 – 2019-06-29 (×4): via TOPICAL
  Filled 2019-06-19 (×2): qty 1

## 2019-06-19 MED ORDER — POTASSIUM CHLORIDE CRYS ER 20 MEQ PO TBCR
40.0000 meq | EXTENDED_RELEASE_TABLET | ORAL | Status: AC
  Administered 2019-06-19 (×2): 40 meq via ORAL
  Filled 2019-06-19 (×2): qty 2

## 2019-06-19 MED ORDER — ADULT MULTIVITAMIN W/MINERALS CH
1.0000 | ORAL_TABLET | Freq: Every day | ORAL | Status: DC
  Administered 2019-06-19 – 2019-06-29 (×11): 1 via ORAL
  Filled 2019-06-19 (×11): qty 1
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
## 2019-06-19 NOTE — Progress Notes (Signed)
Hypoglycemic Event  CBG: 32  Treatment: 8 oz juice/soda  Symptoms: None  Follow-up CBG: FREV:2003 CBG Result:126  Possible Reasons for Event: Inadequate meal intake  Comments/MD notified:webb,md    [name], [name]

## 2019-06-19 NOTE — Progress Notes (Addendum)
Pt has refused several things in her care today. She refused AM labs, she refused to work with PT, refusing dressing changes, she initially refused insulin but eventually took it & is now refusing to get her x-ray done. MD called & notified. Will continue to monitor pt. RN has educated pt on the importance of participating & allowing [place] to care for pt. Pt still refusing. Hoover Brunette, RN

## 2019-06-19 NOTE — Progress Notes (Addendum)
Family Medicine Teaching Service Daily Progress Note Intern Pager: [phone]  Patient name: [name] Medical record number: [id number] Date of birth: [birth year] Age: 68 y.o. Gender: female  Primary Care Provider: Cleophas Dunker, DO Consultants: None Code Status: Full  Pt Overview and Major Events to Date:  10/22-admitted 10/22-IV vanc, cefepime, Flagyl started  Assessment and Plan: [name] is a 68 y.o. female presenting with worsening bilateral lower extremity erythema in the setting of chronic insufficiency with stasis ulcer, suspicious for cellulitis. PMH is significant for chronic venous insufficiency with stasis ulcers, atrial fibrillation, borderline personality disorder, diabetes, normocytic anemia, chronic pain.  Bilateral LE cellulitis, in setting of chronic venous insufficiency with stasis ulcers: Acute, worsening.  Vitals currently stable.  Xray of right lower leg shows soft tissue swelling without definite acute bony abnormalities and soft tissue ulcer at lateral aspect of distal right leg. Morning labs show white blood cell count of 5.3.  Brief overview: Patient with several week history of swelling and erythema in her bilateral lower extremities with foul-smelling ulceration on her right pretibial area.  Patient has a history of being noncompliant with medications and was found in poor living environment.  Previously admitted for the same from 9/1 to 05/22/2019.  Elevated temperature of 100F and mildly tachycardic in the setting of A. fib, otherwise hemodynamically stable.  Significant erythema on the BLE to level of upper shin and malodorous stage III ulcer on right LE with maggots and purulent drainage.  No leukocytosis.  CRP 21, ESR 129.  LA WNL.  Wound culture during last admission showing polymicrobial infection.  -Status post vancomycin and Zosyn in the emergency department -Continue IV vancomycin and will transition to Cefepime/Flagyl w/ CKD  -MRI ordered for  right leg further evaluate for possible osteo. -Plain films ordered of left leg - Vascular surgery consult -Arterial Dopplers bilaterally. -Follow-up blood cultures -IV morphine 4 mg every 4 hours as needed  -Will irrigation and cleansing of LE -Consult wound care for evaluation -PT/OT   Hypokalemia Potassium 3.0 this morning 10/23.  Will replace with 40 of K-Dur. -Daily BMP  Paroxysmal atrial fibrillation: Chronic, stable. EKG on arrival showing atrial flutter/fib.  Heart rate maintaining around 90-110's, may additionally have worsening tachycardia in the setting of current infection.  Asymptomatic.  CHA2DS2-VASc score 3, however has adamantly refused anticoagulation numerous times in previous hospitalization and outpatient follow-up.  Does not appear that she has been on rate control in the past. -Monitor on telemetry -Will start Metop 12.2m BID for rate control, BP stable, titrate as needed    Borderline personality disorder  MDD: Stable. Unsure if patient has been taking her home trazodone 557m Strong history of being verbally abusive and aggressive.  Evaluated by psychiatry during recent admission in 04/2019 who felt her behavior could be indicative of a personality component.  Started on Cymbalta recently for which she endorsed improved her mood, however discontinued this after discharge. -Consider restarting Cymbalta for mood/neuropathic pain  CKD, stage IIIa: Stable. Cr 1.01, GFR 57.  Baseline around 1-1.2. -Monitor BMP  Diabetes mellitus, type II: Recent diagnosis, stable. Overnight CBGs 109-129, A1c 6.6 in 04/2019.  Does not currently take any medications for this.  Suspect glucose will have worsening in the setting of current infection. -Monitor CBGs with meals and nightly -Sensitive SSI if needed -Recommend starting metformin as an outpatient  High risk social situation: Chronic. Lives by herself, has refused SNF and discharged from numerous home health agencies.   EMS noted  poor living situation with cockroaches, bedbugs, and maggots throughout her living space.  Improvement of the situation is complicated by patient's previous behaviors and compliance. -Social work/care management consult, assess if there is any additional resources we can provide for her to improve access to care and living situation  Normocytic anemia: Chronic, stable. Hemoglobin 9.9 on admission, baseline appears to be around 9-11.    Current hemoglobin 8.7.  Suspect this is likely multifactorial, however found to be iron deficient during recent hospitalization with iron saturation <15.  Do not believe that she has been taking her ferrous sulfate at home. -CBC in a.m.  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  Not currently on chronic pain medication, however started on gabapentin and Cymbalta for neuropathic pain during last admission.  Does not appear that she has been continuing to take the Cymbalta. -Continue home gabapentin -Can consider restarting Cymbalta  Recent C. difficile enteritis: Resolved. Developed C. difficile after antibiotic course during recent hospitalization, received full course of oral vancomycin with resolution of symptoms.  Will be mindful of this and monitor for symptomatology as she is receiving antibiotic course currently. - Probiotics and will narrow antibiotics as cultures result.  Constipation: Chronic, stable. Endorses regular bowel movement weekly. -Start MiraLAX daily.  FEN/GI: Carb modified PPx: Lovenox  Disposition: Pending medical work-up  Subjective:  Patient complains of bilateral leg pain.  States her right seems to hurt worse than left but that they both have been hurting for quite some time.  Foul smell noted in lower extremities.  Patient denies other complaints at this time.  Patient was tearful at times during my encounter stating that she  just wants to get her legs better so she can walk.  Objective: Temp:  [98 F (36.7 C)-99.2 F (37.3 C)] 98.3 F (36.8 C) (10/23 0842) Pulse Rate:  [79-108] 90 (10/23 0842) Resp:  [15-20] 18 (10/23 0842) BP: (100-141)/(44-78) 100/44 (10/23 0842) SpO2:  [91 %-99 %] 96 % (10/23 0842) Weight:  [74.5 kg] 74.5 kg (10/23 0100) Physical Exam:  General: Alert and oriented in no apparent distress Heart: Regular rate and rhythm with no murmurs appreciated Lungs: CTA bilaterally, no wheezing Abdomen: Bowel sounds present, no abdominal pain Skin: Warm and dry Extremities: Bilateral lower extremities wrapped in gauze, patient refused to allow palpation for pedal or tibial pulses.  Foul smell noted from lower limbs.   Laboratory: Recent Labs  Lab 06/18/19 0802 06/19/19 0636  WBC 9.4 5.3  HGB 9.9* 8.7*  HCT 32.6* 28.4*  PLT 393 281   Recent Labs  Lab 06/18/19 0802 06/19/19 0636  NA 137 138  K 3.2* 3.0*  CL 99 106  CO2 25 23  BUN 22 16  CREATININE 1.18* 1.01*  CALCIUM 8.8* 8.2*  PROT 7.4 6.0*  BILITOT 1.2 0.8  ALKPHOS 71 52  ALT 21 17  AST 32 25  GLUCOSE 167* 125*      Imaging/Diagnostic Tests: Dg Tibia/fibula Right Port  Result Date: 06/18/2019 CLINICAL DATA:  Leg ulcer EXAM: PORTABLE RIGHT TIBIA AND FIBULA - 2 VIEW COMPARISON:  05/04/2019 FINDINGS: Separate AP views of the proximal and distal RIGHT lower leg. Patient uncooperative for additional imaging, no lateral view obtained. Osseous demineralization. Knee and ankle joint spaces preserved. Diffuse soft tissue swelling of the RIGHT lower leg with large area of soft tissue irregularity and ulceration at the lateral aspect of the distal RIGHT lower leg. No  definite fracture, dislocation or bone destruction identified on limited assessment. IMPRESSION: Soft tissue swelling without definite acute bony abnormalities on with assessment. Soft tissue ulcer at lateral aspect of distal RIGHT lower leg. Electronically Signed   By:  Lavonia Dana M.D.   On: 06/18/2019 12:32    Lurline Del, DO 06/19/2019, 8:54 AM PGY-1, [place] Intern pager: [phone], text pages welcome

## 2019-06-19 NOTE — Consult Note (Signed)
Hospital Consult    Reason for Consult: Bilateral lower extremity wounds Referring Physician: Family medicine MRN #:  [id number]  History of Present Illness: This is a 68 y.o. female with history of chronic A. fib, stage III chronic disease, diabetes, and bilateral extremity cellulitis that vascular surgery has been consulted for bilateral lower extremity wounds.  Patient states she has had wounds on her lower shins for at least 6 years.  She states she has a history of polio and is nonambulatory.  She states she does transfer from her wheelchair to her car.  She sits in a wheelchair all day.  She denies any previous lower extremity revascularization procedures.  Has not seen a vascular surgeon before.  Unable to provide any history on previous lower extremity wound care.  Primary team documents venous insuffiencey.  Past Medical History:  Diagnosis Date  . Arthritis   . Hypoglycemia   . Pneumonia, pneumococcal [facility])     Past Surgical History:  Procedure Laterality Date  . ABDOMINAL HYSTERECTOMY    . CESAREAN SECTION      Allergies  Allergen Reactions  . Codeine Other (See Comments)    "Gets high"  . Tylenol [Acetaminophen] Hives    Prior to Admission medications   Medication Sig Start Date End Date Taking? Authorizing Provider  ibuprofen (ADVIL) 200 MG tablet Take 400 mg by mouth every 6 (six) hours as needed for moderate pain.   Yes [provider]  naproxen sodium (ALEVE) 220 MG tablet Take 220 mg by mouth daily as needed (pain).   Yes [provider]  DULoxetine (CYMBALTA) 30 MG capsule Take 1 capsule (30 mg total) by mouth daily. Patient not taking: Reported on 06/18/2019 05/23/19   Orpah Cobb P, DO  ferrous sulfate 325 (65 FE) MG tablet Take 1 tablet (325 mg total) by mouth daily with breakfast. Patient not taking: Reported on 06/18/2019 05/23/19   Orpah Cobb P, DO  gabapentin (NEURONTIN) 400 MG capsule Take 1 capsule (400 mg total) by mouth 3  (three) times daily. Patient not taking: Reported on 06/18/2019 05/22/19   Orpah Cobb P, DO  traZODone (DESYREL) 50 MG tablet Take 1 tablet (50 mg total) by mouth at bedtime. Patient not taking: Reported on 06/18/2019 05/22/19   Orpah Cobb P, DO  furosemide (LASIX) 20 MG tablet Take 0.5 tablets (10 mg total) by mouth daily. Patient not taking: Reported on 04/18/2018 12/10/17 05/04/19  Arthor Captain, PA-C    Social History   Socioeconomic History  . Marital status: Single    Spouse name: Not on file  . Number of children: Not on file  . Years of education: Not on file  . Highest education level: Not on file  Occupational History  . Not on file  Social Needs  . Financial resource strain: Not on file  . Food insecurity    Worry: Sometimes true    Inability: Sometimes true  . Transportation needs    Medical: Not on file    Non-medical: Not on file  Tobacco Use  . Smoking status: Heavy Tobacco Smoker    Packs/day: 0.50  . Smokeless tobacco: Never Used  Substance and Sexual Activity  . Alcohol use: No  . Drug use: No  . Sexual activity: Not on file  Lifestyle  . Physical activity    Days per week: Not on file    Minutes per session: Not on file  . Stress: Not on file  Relationships  . Social connections  Talks on phone: Not on file    Gets together: Not on file    Attends religious service: Not on file    Active member of club or organization: Not on file    Attends meetings of clubs or organizations: Not on file    Relationship status: Not on file  . Intimate partner violence    Fear of current or ex partner: Not on file    Emotionally abused: Not on file    Physically abused: Not on file    Forced sexual activity: Not on file  Other Topics Concern  . Not on file  Social History Narrative  . Not on file     No family history on file.  ROS: [x]  Positive   [ ]  Negative   [ ]  All sytems reviewed and are negative  Cardiovascular: []  chest pain/pressure  []  palpitations []  SOB lying flat []  DOE []  pain in legs while walking []  pain in legs at rest []  pain in legs at night []  non-healing ulcers []  hx of DVT []  swelling in legs  Pulmonary: []  productive cough []  asthma/wheezing []  home O2  Neurologic: []  weakness in []  arms []  legs []  numbness in []  arms []  legs []  hx of CVA []  mini stroke [] difficulty speaking or slurred speech []  temporary loss of vision in one eye []  dizziness  Hematologic: []  hx of cancer []  bleeding problems []  problems with blood clotting easily  Endocrine:   []  diabetes []  thyroid disease  GI []  vomiting blood []  blood in stool  GU: []  CKD/renal failure []  HD--[]  M/W/F or []  T/T/S []  burning with urination []  blood in urine  Psychiatric: []  anxiety []  depression  Musculoskeletal: []  arthritis []  joint pain  Integumentary: []  rashes []  ulcers  Constitutional: []  fever []  chills   Physical Examination  Vitals:   06/19/19 1140 06/19/19 1608  BP: (!) 93/45 (!) 104/52  Pulse: 75 93  Resp: 20 16  Temp: 98.6 F (37 C) 99.3 F (37.4 C)  SpO2: 99% 99%   Body mass index is 26.51 kg/m.  General:  WDWN in NAD Gait: Not observed HENT: WNL, normocephalic Pulmonary: normal non-labored breathing, without Rales, rhonchi,  wheezing Cardiac: RRR Abdomen: soft, NT/ND, no masses Vascular Exam/Pulses: 2+ palpable femoral pulses bilaterally 2+ palpable dorsalis pedis and posterior tibial pulses bilaterally Has a right heel ulcer Lower extremity wounds on the shins are covered in dressings that patient would not let me remove Feet look well perfused Musculoskeletal: no muscle wasting or atrophy  Neurologic: A&O X 3; Appropriate Affect ; SENSATION: normal; MOTOR FUNCTION:  moving all extremities equally. Speech is fluent/normal   CBC    Component Value Date/Time   WBC 5.3 06/19/2019 0636   RBC 3.11 (L) 06/19/2019 0636   HGB 8.7 (L) 06/19/2019 0636   HCT 28.4 (L) 06/19/2019 0636    PLT 281 06/19/2019 0636   MCV 91.3 06/19/2019 0636   MCH 28.0 06/19/2019 0636   MCHC 30.6 06/19/2019 0636   RDW 16.1 (H) 06/19/2019 0636   LYMPHSABS 1.1 06/19/2019 0636   MONOABS 0.6 06/19/2019 0636   EOSABS 0.3 06/19/2019 0636   BASOSABS 0.1 06/19/2019 0636    BMET    Component Value Date/Time   NA 138 06/19/2019 0636   K 3.0 (L) 06/19/2019 0636   CL 106 06/19/2019 0636   CO2 23 06/19/2019 0636   GLUCOSE 125 (H) 06/19/2019 0636   BUN 16 06/19/2019 0636   CREATININE 1.01 (H)  06/19/2019 0636   CALCIUM 8.2 (L) 06/19/2019 0636   GFRNONAA 57 (L) 06/19/2019 0636   GFRAA >60 06/19/2019 0636    COAGS: No results found for: INR, PROTIME   Non-Invasive Vascular Imaging:    ABI's pending   ASSESSMENT/PLAN: This is a 68 y.o. female with multiple medical problems as noted above that was admitted with bilateral lower extremity cellulitis.  Vascular surgery was asked to evaluate bilateral lower extremity tissue loss.  On exam patient has palpable dorsalis pedis and posterior tibial pulses in bilateral lower extremities.  Her tissue loss is not in the distribution of arterial disease.  I suspect this is liekly venous ulcer etiology given distribution.  She would not let me remove the dressings at bedside.  I reviewed the pictures in media in the chart that shows ulcerations at the shin level with thickened skin etc .  ABIs can be completed as ordered but doubt she will require any arterial revascularization for perfusion based on her exam with palpable pulses.  Current standard of care would be Unna boots until her ulcerations heal.  Cephus Shellinghristopher J. Clark, MD [facility] Office: [phone] Pager: [phone]

## 2019-06-19 NOTE — Progress Notes (Signed)
Patient refuse labs this morning and she was violent with lab tech.  She has been constantly verbally abusing floor staff and stating that she would hit people because she can and dislikes all staff providing care.

## 2019-06-19 NOTE — Progress Notes (Signed)
Family Medicine Teaching Service Daily Progress Note Intern Pager: [phone]  Patient name: [name] Medical record number: [id number] Date of birth: 1950/12/22 Age: 68 y.o. Gender: female  Primary Care Provider: Cleophas Dunker, DO Consultants: None Code Status: Full  Pt Overview and Major Events to Date:  10/22-admitted 10/22-IV vanc, cefepime, Flagyl started  Assessment and Plan: [name] is a 68 y.o. female presenting with worsening bilateral lower extremity erythema in the setting of chronic insufficiency with stasis ulcer, suspicious for cellulitis. PMH is significant for chronic venous insufficiency with stasis ulcers, atrial fibrillation, borderline personality disorder, diabetes, normocytic anemia, chronic pain.  Bilateral LE cellulitis, in setting of chronic venous insufficiency with stasis ulcers: Acute, subjectively stable patient did not want anyone touching her leg this morning.  She was insistent that there will be no debridement or " scraping" of her leg because she believes this has caused issues for her in the past and increased her wounding.  I did discuss with her quite plainly that without appropriate wound care there is an increased odds of worsening infection and potential limb loss or death over time.  MRI negative for osteo.  Vascular has been consulted and do not believe this is arterial, they believe this is a distribution of venous insufficiency. Vitals currently stable.  Xray of right lower leg shows soft tissue swelling without definite acute bony abnormalities and soft tissue ulcer at lateral aspect of distal right leg. Morning labs show white blood cell count of 5.3.  Brief overview: Patient with several week history of swelling and erythema in her bilateral lower extremities with foul-smelling ulceration on her right pretibial area.  Patient has a history of being noncompliant with medications and was found in poor living environment.  Previously  admitted for the same from 9/1 to 05/22/2019.  Elevated temperature of 100F and mildly tachycardic in the setting of A. fib, otherwise hemodynamically stable.  Significant erythema on the BLE to level of upper shin and malodorous stage III ulcer on right LE with maggots and purulent drainage.  No leukocytosis.  CRP 21, ESR 129.  LA WNL.  Wound culture during last admission showing polymicrobial infection.  -Status post vancomycin and Zosyn in the emergency department -Continue IV vancomycin (10/22- ) and will transition to Cefepime( 10/23- )/Flagyl (10/23- ) w/ CKD  -MRI ordered for right leg further evaluate for possible osteo. -Plain films ordered of left leg -Arterial Dopplers bilaterally, pending -Follow-up blood cultures -IV morphine 4 mg every 4 hours as needed  -Will irrigation and cleansing of LE -Consult wound care for evaluation, appreciate their attempt to get the appropriate care to this patient despite past refusals. -PT/OT  -We will discuss Ortho referral with day team attending.  Hypokalemia, 3.0 the morning of 10/23, this was replenished morning labs 10/24 pending -Daily BMP  Paroxysmal atrial fibrillation: Chronic, stable non-RVR EKG on arrival showing atrial flutter/fib.  Heart rate maintaining around 90-110's, may additionally have worsening tachycardia in the setting of current infection.    Asymptomatic CHA2DS2-VASc score 3, however has adamantly refused anticoagulation numerous times in previous hospitalization and outpatient follow-up.  Does not appear that she has been on rate control in the past. -Monitor on telemetry -Continue Metop 12.62m BID for rate control, BP stable, titrate as needed    Borderline personality disorder  MDD: Roughly stable with prior admissions, potentially interfering with medical care as she often refuses Unsure if patient has been taking her home trazodone 518m Strong history of being  verbally abusive and aggressive.  Evaluated by psychiatry  during recent admission in 04/2019 who felt her behavior could be indicative of a personality component.  Started on Cymbalta recently for which she endorsed improved her mood, however discontinued this after discharge. -Patient did want to restart her Cymbalta and wanted to decrease her gabapentin and morphine.  CKD, stage IIIa: Stable on 10/23, 10/24 labs pending Cr 1.01, GFR 57.  Baseline around 1-1.2. -Monitor BMP  Diabetes mellitus, type II: Recent diagnosis, stable. CBGs low 100s this admission, A1c 6.6 in 04/2019.  Does not currently take any medications for this.  Suspect glucose will have worsening in the setting of current infection. -Monitor CBGs with meals and nightly -Sensitive SSI -Can consider starting metformin as an outpatient  High risk social situation: Chronic. Lives by herself, has refused SNF and discharged from numerous home health agencies.  EMS noted poor living situation with cockroaches, bedbugs, and maggots throughout her living space.  Improvement of the situation is complicated by patient's previous behaviors and compliance. -Social work/care management consult, assess if there is any additional resources we can provide for her to improve access to care and living situation  Normocytic anemia: Chronic, decreasing but still above transfusion threshold, 10/24 labs pending hemoglobin 8.7 morning of 10/23, baseline appears to be around 9-11.     Suspect this is likely multifactorial, however found to be iron deficient during recent hospitalization with iron saturation <15.  Do not believe that she has been taking her ferrous sulfate at home. 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  Not currently on chronic pain medication, however started on gabapentin and Cymbalta for neuropathic pain during  last admission.  Does not appear that she has been continuing to take the Cymbalta. -Patient requested gabapentin reduced to 200 mg 3 times daily from 400 mg 3 times daily -Patient did request restarting Cymbalta.  Constipation: Chronic, stable. Endorses regular bowel movement weekly. -Start MiraLAX daily.  FEN/GI: Carb modified PPx: Lovenox  Disposition: Pending medical work-up  Subjective:  We were able to have a productive conversation this morning.  Patient has been frustrated with a belief that a prior wound care doctor caused worsening infection and a procedure (I have been unable to find charting about this).  She states that she does not ever refuse things but that she just wants second opinions and she wishes she could have a conversation with Dr. Hughes Better about all procedures happening here in the hospital.  I explained to her that her PCP is not in the hospital but the primary care team are her coworkers to work with her in the clinic.  She was encouraged by this and was quite pleased and I told her that she could asked to speak to 1 of [place] if she had questions about procedures being proposed and then time.  We discussed potentially using the phrasing of "let me speak to my doctor first "as opposed to saying no to procedures.  We discussed that there are serious health risks to refusing all medical care and that we cannot take care of her if she says no to everything although she does have the right to do so.  She was able to verbalize that she understands a potential outcome of untreated infection is loss of limb or death at some point, and she says that  she is not scared of either but that she is not going to be forced to do think she does not want to do.  We made the medication changes as she requested and she agrees to speak with wound care here in the hospital, due to a prior conflict with someone at the wound care center she refuses to go there for follow-up and would want to do her  follow-up with the family medicine clinic, preferably with Dr. Hughes Better  Objective: Temp:  [98.3 F (36.8 C)-99.5 F (37.5 C)] 99.5 F (37.5 C) (10/23 2156) Pulse Rate:  [75-97] 97 (10/23 2156) Resp:  [15-22] 22 (10/23 2156) BP: (93-127)/(44-62) 127/62 (10/23 2156) SpO2:  [91 %-100 %] 100 % (10/23 2156) Weight:  [74.5 kg] 74.5 kg (10/23 0100) Physical Exam:  General: Alert and oriented in no apparent distress, Heart: Irregular rate, not RVR Lungs: No increased work of breathing, clear to auscultation bilaterally, no wheezing or stridor Skin: Warm and dry Extremities: Patient did not allow me to unwrap her legs to evaluate wounds this morning but she says that they are unchanged from yesterday. Psych: We had a productive conversation this morning and patient was able to articulate consequences of treatment refusal although I still question how fully she processes benefits of proposed wound treatment   Laboratory: Recent Labs  Lab 06/18/19 0802 06/19/19 0636  WBC 9.4 5.3  HGB 9.9* 8.7*  HCT 32.6* 28.4*  PLT 393 281   Recent Labs  Lab 06/18/19 0802 06/19/19 0636  NA 137 138  K 3.2* 3.0*  CL 99 106  CO2 25 23  BUN 22 16  CREATININE 1.18* 1.01*  CALCIUM 8.8* 8.2*  PROT 7.4 6.0*  BILITOT 1.2 0.8  ALKPHOS 71 52  ALT 21 17  AST 32 25  GLUCOSE 167* 125*      Imaging/Diagnostic Tests: No results found.  Sherene Sires, DO 06/19/2019, 10:56 PM PGY-3, [place] Intern pager: [phone], text pages welcome

## 2019-06-19 NOTE — Progress Notes (Signed)
PT Cancellation Note  Patient Details Name: [name] MRN: [id number] DOB: 1951-01-27   Cancelled Treatment:    Reason Eval/Treat Not Completed: Other (comment).  Declined PT and declined meds with nurse in the room with PT.  Was willing to let PT come by at another time to be seen, expresses concern that her orders are not valid.  Talked with her about MD giving PT and nursing the orders, and so she will consider PT at another time.   Ramond Dial 06/19/2019, 12:22 PM   Mee Hives, PT MS Acute Rehab Dept. Number: [place] and [place]

## 2019-06-19 NOTE — Progress Notes (Signed)
OT Cancellation Note  Patient Details Name: [name] MRN: [id number] DOB: 12/30/1950   Cancelled Treatment:    Reason Eval/Treat Not Completed: Patient declined, no reason specified. Per chart, pt has been refusing tests, PT and labs today. Will continue attempts.  Malka So 06/19/2019, 3:04 PM  Nestor Lewandowsky, OTR/L Acute Rehabilitation Services Pager: [phone] Office: [phone]

## 2019-06-19 NOTE — Evaluation (Signed)
Physical Therapy Evaluation Patient Details Name: KASONDRA JUNOD MRN: [id number] DOB: 04/28/1951 Today's Date: 06/19/2019   History of Present Illness  68 yo female with onset of significant wounds and ulcerations of LE's along with cellulitis was admitted for management of her infection process.  Pt has been home with inadequate care but has been refusing a rehab setting.  PMHx:  L humeral fracture, venous insufficiency, PAF, bipolar and borderline personality disorder, CKD3, DM, polio, c-diff, living with bedbugs and other pests  Clinical Impression  Pt was seen for mobility and only got up to bedside then back due to refusal to try to touch the floor with feet.  Her plan is to get up to walk a step or two for transfer to wheelchair, and to manage sitting balance for use of manual wheelchair.  Pt has no prospects of getting home therapy due to condition of her house, and talked with her about going to rehab for recovery and skin management.  Her immediate reaction was not to go but agreed to think about this.  Follow acutely with expectation that she is likely to refuse the appropriate care level.    Follow Up Recommendations SNF    Equipment Recommendations  None recommended by PT    Recommendations for Other Services       Precautions / Restrictions Precautions Precautions: Fall Precaution Comments: pt has painful and ulcerated lower legs Restrictions Weight Bearing Restrictions: No Other Position/Activity Restrictions: needs PREMEDICATION for therapy      Mobility  Bed Mobility Overal bed mobility: Needs Assistance Bed Mobility: Supine to Sit;Sit to Supine     Supine to sit: Mod assist Sit to supine: Mod assist   General bed mobility comments: mod to support legs and then assist trunk  Transfers Overall transfer level: (refused)               General transfer comment: refused due to leg pain  Ambulation/Gait             General Gait Details: states she is  not ambulatory  Stairs            Wheelchair Mobility    Modified Rankin (Stroke Patients Only)       Balance Overall balance assessment: History of Falls;Needs assistance Sitting-balance support: Feet unsupported;Bilateral upper extremity supported Sitting balance-Leahy Scale: Fair                                       Pertinent Vitals/Pain Pain Assessment: Faces Faces Pain Scale: Hurts whole lot Pain Location: BLE's with RLE worst Pain Descriptors / Indicators: Tender;Stabbing;Pressure Pain Intervention(s): Limited activity within patient's tolerance;Monitored during session;Premedicated before session;Repositioned    Home Living Family/patient expects to be discharged to:: Private residence Living Arrangements: Alone Available Help at Discharge: Friend(s);Available PRN/intermittently Type of Home: Apartment Home Access: Elevator     Home Layout: One level Home Equipment: Wheelchair - [employer] - 2 wheels      Prior Function Level of Independence: Needs assistance   Gait / Transfers Assistance Needed: walks with minimal distance as she is usually just up in wheelchair  ADL's / Homemaking Assistance Needed: has not been taking care of herself with hygiene and wound care        Hand Dominance   Dominant Hand: Right    Extremity/Trunk Assessment   Upper Extremity Assessment Upper Extremity Assessment: Generalized weakness    Lower  Extremity Assessment Lower Extremity Assessment: Generalized weakness    Cervical / Trunk Assessment Cervical / Trunk Assessment: Kyphotic  Communication   Communication: No difficulties  Cognition Arousal/Alertness: Awake/alert Behavior During Therapy: Anxious Overall Cognitive Status: History of cognitive impairments - at baseline                                 General Comments: has been home refusing care      General Comments General comments (skin integrity, edema, etc.): pt  in bed with legs elevated and wrapped, drainage on linen beneath them    Exercises     Assessment/Plan    PT Assessment Patient needs continued PT services  PT Problem List Decreased strength;Decreased range of motion;Decreased activity tolerance;Decreased balance;Decreased mobility;Decreased coordination;Decreased knowledge of use of DME;Decreased safety awareness;Cardiopulmonary status limiting activity;Decreased skin integrity;Pain       PT Treatment Interventions DME instruction;Gait training;Functional mobility training;Therapeutic activities;Therapeutic exercise;Balance training;Neuromuscular re-education;Patient/family education    PT Goals (Current goals can be found in the Care Plan section)  Acute Rehab PT Goals Patient Stated Goal: to get pain to go away and get home PT Goal Formulation: With patient Time For Goal Achievement: 07/03/19 Potential to Achieve Goals: Good    Frequency Min 3X/week   Barriers to discharge Inaccessible home environment;Decreased caregiver support home with no help to move bed to chair    Co-evaluation               AM-PAC PT "6 Clicks" Mobility  Outcome Measure Help needed turning from your back to your side while in a flat bed without using bedrails?: A Little Help needed moving from lying on your back to sitting on the side of a flat bed without using bedrails?: A Little Help needed moving to and from a bed to a chair (including a wheelchair)?: Total Help needed standing up from a chair using your arms (e.g., wheelchair or bedside chair)?: Total Help needed to walk in hospital room?: Total Help needed climbing 3-5 steps with a railing? : Total 6 Click Score: 10    End of Session   Activity Tolerance: Patient limited by pain;Treatment limited secondary to medical complications (Comment) Patient left: in bed;with call bell/phone within reach;with bed alarm set Nurse Communication: Mobility status PT Visit Diagnosis: Unsteadiness  on feet (R26.81);Muscle weakness (generalized) (M62.81);Pain Pain - Right/Left: (both) Pain - part of body: Leg;Ankle and joints of foot    Time: 1516-1540 PT Time Calculation (min) (ACUTE ONLY): 24 min   Charges:   PT Evaluation $PT Eval Moderate Complexity: 1 Mod PT Treatments $Therapeutic Activity: 8-22 mins       Ivar Drape 06/19/2019, 5:50 PM   Samul Dada, PT MS Acute Rehab Dept. Number: [facility] [id number] and [facility] [phone]

## 2019-06-20 ENCOUNTER — Encounter (HOSPITAL_COMMUNITY): Payer: Medicare Other

## 2019-06-20 ENCOUNTER — Inpatient Hospital Stay (HOSPITAL_COMMUNITY): Payer: Medicare Other

## 2019-06-20 DIAGNOSIS — I83009 Varicose veins of unspecified lower extremity with ulcer of unspecified site: Secondary | ICD-10-CM

## 2019-06-20 DIAGNOSIS — R102 Pelvic and perineal pain: Secondary | ICD-10-CM | POA: Diagnosis not present

## 2019-06-20 DIAGNOSIS — L03116 Cellulitis of left lower limb: Secondary | ICD-10-CM | POA: Diagnosis not present

## 2019-06-20 DIAGNOSIS — R7982 Elevated C-reactive protein (CRP): Secondary | ICD-10-CM | POA: Diagnosis not present

## 2019-06-20 DIAGNOSIS — L03115 Cellulitis of right lower limb: Secondary | ICD-10-CM | POA: Diagnosis not present

## 2019-06-20 LAB — GLUCOSE, CAPILLARY
Glucose-Capillary: 89 mg/dL (ref 70–99)
Glucose-Capillary: 136 mg/dL — ABNORMAL HIGH (ref 70–99)
Glucose-Capillary: 97 mg/dL (ref 70–99)
Glucose-Capillary: 140 mg/dL — ABNORMAL HIGH (ref 70–99)

## 2019-06-20 MED ORDER — GABAPENTIN 300 MG PO CAPS
600.0000 mg | ORAL_CAPSULE | Freq: Three times a day (TID) | ORAL | Status: DC
  Administered 2019-06-20 – 2019-06-29 (×27): 600 mg via ORAL
  Filled 2019-06-20 (×28): qty 2

## 2019-06-20 MED ORDER — MORPHINE SULFATE (PF) 2 MG/ML IV SOLN
1.0000 mg | INTRAVENOUS | Status: DC | PRN
  Administered 2019-06-20: 1 mg via INTRAVENOUS
  Filled 2019-06-20: qty 1

## 2019-06-20 MED ORDER — GABAPENTIN 100 MG PO CAPS
200.0000 mg | ORAL_CAPSULE | Freq: Three times a day (TID) | ORAL | Status: DC
  Administered 2019-06-20: 200 mg via ORAL
  Filled 2019-06-20: qty 2

## 2019-06-20 MED ORDER — DULOXETINE HCL 20 MG PO CPEP
20.0000 mg | ORAL_CAPSULE | Freq: Every day | ORAL | Status: DC
  Administered 2019-06-20 – 2019-06-22 (×3): 20 mg via ORAL
  Filled 2019-06-20 (×3): qty 1

## 2019-06-20 MED ORDER — MORPHINE SULFATE (PF) 2 MG/ML IV SOLN
1.0000 mg | Freq: Four times a day (QID) | INTRAVENOUS | Status: DC | PRN
  Administered 2019-06-20 – 2019-06-23 (×3): 1 mg via INTRAVENOUS
  Filled 2019-06-20 (×3): qty 1

## 2019-06-20 NOTE — Progress Notes (Signed)
OT Cancellation Note  Patient Details Name: [name] MRN: [id number] DOB: 1951/04/15   Cancelled Treatment:    Reason Eval/Treat Not Completed: Patient at procedure or test/ unavailable. Pt currently out of room for test/procedure. Plan to reattempt OT eval, likely tomorrow.  Tyrone Schimke, OT Acute Rehabilitation Services Pager: [phone] Office: [phone]  06/20/2019, 2:36 PM

## 2019-06-20 NOTE — Progress Notes (Addendum)
Pt would not go to xray unless she received pain meds prior to. MD notified. Spoke with MD about a different access for pt d/t pt's IVs needing to be replaced 2 days in a row. Pt is on vancomycin which is believed to be causing this. RN today decreased the vanc to 100 & even ran it with NS but still unsuccessful at saving the IV. MD asked how long pt will need to be on the Vanc & informed on other options if pt is to remain on it for a longer time frame. Will continue to monitor pt & get IV team to discuss more with MD if needed. Hoover Brunette, RN

## 2019-06-21 DIAGNOSIS — Z20828 Contact with and (suspected) exposure to other viral communicable diseases: Secondary | ICD-10-CM | POA: Diagnosis not present

## 2019-06-21 DIAGNOSIS — F330 Major depressive disorder, recurrent, mild: Secondary | ICD-10-CM | POA: Diagnosis not present

## 2019-06-21 DIAGNOSIS — L03116 Cellulitis of left lower limb: Secondary | ICD-10-CM | POA: Diagnosis not present

## 2019-06-21 DIAGNOSIS — R456 Violent behavior: Secondary | ICD-10-CM | POA: Diagnosis not present

## 2019-06-21 DIAGNOSIS — L97909 Non-pressure chronic ulcer of unspecified part of unspecified lower leg with unspecified severity: Secondary | ICD-10-CM | POA: Diagnosis not present

## 2019-06-21 DIAGNOSIS — I83009 Varicose veins of unspecified lower extremity with ulcer of unspecified site: Secondary | ICD-10-CM | POA: Diagnosis not present

## 2019-06-21 DIAGNOSIS — L03115 Cellulitis of right lower limb: Secondary | ICD-10-CM | POA: Diagnosis not present

## 2019-06-21 DIAGNOSIS — R102 Pelvic and perineal pain: Secondary | ICD-10-CM | POA: Diagnosis not present

## 2019-06-21 DIAGNOSIS — I482 Chronic atrial fibrillation, unspecified: Secondary | ICD-10-CM | POA: Diagnosis not present

## 2019-06-21 LAB — BASIC METABOLIC PANEL
Anion gap: 8 (ref 5–15)
BUN: 21 mg/dL (ref 8–23)
CO2: 26 mmol/L (ref 22–32)
Calcium: 8.3 mg/dL — ABNORMAL LOW (ref 8.9–10.3)
Chloride: 103 mmol/L (ref 98–111)
Creatinine, Ser: 0.72 mg/dL (ref 0.44–1.00)
GFR calc Af Amer: >60 mL/min (ref >60)
GFR calc non Af Amer: >60 mL/min (ref >60)
Glucose, Bld: 124 mg/dL — ABNORMAL HIGH (ref 70–99)
Potassium: 4.4 mmol/L (ref 3.5–5.1)
Sodium: 137 mmol/L (ref 135–145)

## 2019-06-21 LAB — CBC
HCT: 27.5 % — ABNORMAL LOW (ref 36.0–46.0)
Hemoglobin: 8.4 g/dL — ABNORMAL LOW (ref 12.0–15.0)
MCH: 27.8 pg (ref 26.0–34.0)
MCHC: 30.5 g/dL (ref 30.0–36.0)
MCV: 91.1 fL (ref 80.0–100.0)
Platelets: 283 10*3/uL (ref 150–400)
RBC: 3.02 MIL/uL — ABNORMAL LOW (ref 3.87–5.11)
RDW: 16.3 % — ABNORMAL HIGH (ref 11.5–15.5)
WBC: 3.9 10*3/uL — ABNORMAL LOW (ref 4.0–10.5)
nRBC: 0 % (ref 0.0–0.2)

## 2019-06-21 LAB — GLUCOSE, CAPILLARY
Glucose-Capillary: 138 mg/dL — ABNORMAL HIGH (ref 70–99)
Glucose-Capillary: 109 mg/dL — ABNORMAL HIGH (ref 70–99)
Glucose-Capillary: 134 mg/dL — ABNORMAL HIGH (ref 70–99)
Glucose-Capillary: 144 mg/dL — ABNORMAL HIGH (ref 70–99)

## 2019-06-21 MED ORDER — VANCOMYCIN HCL 10 G IV SOLR
1500.0000 mg | INTRAVENOUS | Status: DC
  Filled 2019-06-21: qty 1500

## 2019-06-21 MED ORDER — VANCOMYCIN HCL 10 G IV SOLR: 1500.0000 mg | INTRAVENOUS | Status: DC

## 2019-06-21 MED ORDER — CIPROFLOXACIN HCL 500 MG PO TABS
500.0000 mg | ORAL_TABLET | Freq: Two times a day (BID) | ORAL | Status: DC
  Administered 2019-06-21 – 2019-06-25 (×9): 500 mg via ORAL
  Filled 2019-06-21 (×10): qty 1

## 2019-06-21 MED ORDER — PNEUMOCOCCAL VAC POLYVALENT 25 MCG/0.5ML IJ INJ
0.5000 mL | INJECTION | INTRAMUSCULAR | Status: AC
  Administered 2019-06-22: 09:00:00 0.5 mL via INTRAMUSCULAR
  Filled 2019-06-21: qty 0.5

## 2019-06-21 MED ORDER — METRONIDAZOLE 500 MG PO TABS
500.0000 mg | ORAL_TABLET | Freq: Three times a day (TID) | ORAL | Status: DC
  Administered 2019-06-21 – 2019-06-25 (×12): 500 mg via ORAL
  Filled 2019-06-21 (×12): qty 1

## 2019-06-21 NOTE — TOC Progression Note (Signed)
Transition of Care [facility]) - Progression Note    Patient Details  Name: ARDYN FORGE MRN: [id number] Date of Birth: May 26, 1951  [facility]) CM/SW Contact  Bartholomew Crews, RN Phone Number: [phone] 06/21/2019, 3:38 PM  Clinical Narrative:    Spoke with patient at bedside with CSW to discuss transition of care needs. PTA lives in an apartment at [facility]. Initially patient stated that she thinks she is going to go into a nursing home for good. Patient advised that she would have to sign over her check, but patient stated that she would not do this. Attempted to discuss her plan to care for herself at home - no plan. Attempted to discuss her behavior when home care nurse called to schedule her assessment for PCS, patient not accountable to her actions/behaviors initially denying that anyone called, then stated that they woke her up. Patient unable or unwilling to engage in discharge planning, CSW advised patient to let her nurse know if she has questions. TOC following for transition needs.      Barriers to Discharge: Continued Medical Work up  Expected Discharge Plan and Services       Post Acute Care Choice: Durable Medical Equipment(wheelchair and rollator) Living arrangements for the past 2 months: Apartment                                       Social Determinants of Health (SDOH) Interventions    Readmission Risk Interventions No flowsheet data found.

## 2019-06-21 NOTE — Consult Note (Signed)
[facility] Face-to-Face Psychiatry Consult   Reason for Consult:  Verbally abusive to staff Referring Physician:  Hospitalist Patient Identification: [name] MRN:  [id number] Principal Diagnosis: Cellulitis of right lower extremity Diagnosis:  Principal Problem:   Cellulitis of right lower extremity Active Problems:   Pressure injury of skin   Cellulitis of left lower extremity   Elevated C-reactive protein (CRP)   Depression, major, recurrent, mild (HCC)   Total Time spent with patient: 45 minutes  Subjective:   [name] is a 68 y.o. female patient reports that she is doing good today. She denies ever having any suicidal or homicidal ideations and denies any hallucinations. She does admit to having some depression, but she chooses to be depressed because se thinks better when she is depressed. She reports that she is working with the city to get her better housing and is already in the program and has been working with them and is now just waiting for a place to come available. She states that she stays where is currently lives to save money, but really wants to move out of there. She states that the Cymbalta did help before, but that was before and not now. She states that she has been treated very well at the hospital and wishes she could take everyone with her because they treat her so good.   HPI:  68 y.o.femalepresenting with worsening bilateral lower extremity erythema in the setting of chronic insufficiency with stasis ulcer, suspicious for cellulitis. PMH is significant forchronic venous insufficiency with stasis ulcers, atrial fibrillation, borderline personality disorder, diabetes, normocytic anemia, chronic pain.  Assessment: Patient presents and appears irritable, but is laughing and joking with me throughout the interview. She has continued to deny any suicidal or homicidal ideations and feels the Cymbalta has helped and is in agreement with restarting the medication and  titrate up on the dose before discharge. At this time the patient does not meet inpatient criteria and is psychiatrically cleared.   Past Psychiatric History: Denies hospitalizations, denies medications, denies suicide attempts or thoughts. Denies any other medications other than Cymbalta  Risk to Self:   Risk to Others:   Prior Inpatient Therapy:   Prior Outpatient Therapy:    Past Medical History:  Past Medical History:  Diagnosis Date  . Arthritis   . Hypoglycemia   . Pneumonia, pneumococcal [facility])     Past Surgical History:  Procedure Laterality Date  . ABDOMINAL HYSTERECTOMY    . CESAREAN SECTION     Family History: No family history on file. Family Psychiatric  History: None reported Social History:  Social History   Substance and Sexual Activity  Alcohol Use No     Social History   Substance and Sexual Activity  Drug Use No    Social History   Socioeconomic History  . Marital status: Single    Spouse name: Not on file  . Number of children: Not on file  . Years of education: Not on file  . Highest education level: Not on file  Occupational History  . Not on file  Social Needs  . Financial resource strain: Not on file  . Food insecurity    Worry: Sometimes true    Inability: Sometimes true  . Transportation needs    Medical: Not on file    Non-medical: Not on file  Tobacco Use  . Smoking status: Heavy Tobacco Smoker    Packs/day: 0.50  . Smokeless tobacco: Never Used  Substance and Sexual Activity  .  Alcohol use: No  . Drug use: No  . Sexual activity: Not on file  Lifestyle  . Physical activity    Days per week: Not on file    Minutes per session: Not on file  . Stress: Not on file  Relationships  . Social Herbalist on phone: Not on file    Gets together: Not on file    Attends religious service: Not on file    Active member of club or organization: Not on file    Attends meetings of clubs or organizations: Not on file     Relationship status: Not on file  Other Topics Concern  . Not on file  Social History Narrative  . Not on file   Additional Social History:    Allergies:   Allergies  Allergen Reactions  . Codeine Other (See Comments)    "Gets high"  . Tylenol [Acetaminophen] Hives    Labs:  Results for orders placed or performed during the hospital encounter of 06/18/19 (from the past 48 hour(s))  Glucose, capillary     Status: Abnormal   Collection Time: 06/19/19  9:49 PM  Result Value Ref Range   Glucose-Capillary 139 (H) 70 - 99 mg/dL   Comment 1 Notify RN    Comment 2 Document in Chart   Glucose, capillary     Status: None   Collection Time: 06/20/19  7:58 AM  Result Value Ref Range   Glucose-Capillary 89 70 - 99 mg/dL  Glucose, capillary     Status: Abnormal   Collection Time: 06/20/19 11:38 AM  Result Value Ref Range   Glucose-Capillary 136 (H) 70 - 99 mg/dL  Glucose, capillary     Status: None   Collection Time: 06/20/19  4:43 PM  Result Value Ref Range   Glucose-Capillary 97 70 - 99 mg/dL  Glucose, capillary     Status: Abnormal   Collection Time: 06/20/19  9:49 PM  Result Value Ref Range   Glucose-Capillary 140 (H) 70 - 99 mg/dL  Basic metabolic panel     Status: Abnormal   Collection Time: 06/21/19  4:18 AM  Result Value Ref Range   Sodium 137 135 - 145 mmol/L   Potassium 4.4 3.5 - 5.1 mmol/L   Chloride 103 98 - 111 mmol/L   CO2 26 22 - 32 mmol/L   Glucose, Bld 124 (H) 70 - 99 mg/dL   BUN 21 8 - 23 mg/dL   Creatinine, Ser 0.72 0.44 - 1.00 mg/dL   Calcium 8.3 (L) 8.9 - 10.3 mg/dL   GFR calc non Af Amer >60 >60 mL/min   GFR calc Af Amer >60 >60 mL/min   Anion gap 8 5 - 15    Comment: Performed at [facility], [place] [address], [place], [place] [address]  CBC     Status: Abnormal   Collection Time: 06/21/19  4:18 AM  Result Value Ref Range   WBC 3.9 (L) 4.0 - 10.5 K/uL   RBC 3.02 (L) 3.87 - 5.11 MIL/uL   Hemoglobin 8.4 (L) 12.0 - 15.0 g/dL   HCT 27.5 (L) 36.0  - 46.0 %   MCV 91.1 80.0 - 100.0 fL   MCH 27.8 26.0 - 34.0 pg   MCHC 30.5 30.0 - 36.0 g/dL   RDW 16.3 (H) 11.5 - 15.5 %   Platelets 283 150 - 400 K/uL   nRBC 0.0 0.0 - 0.2 %    Comment: Performed at [facility],  [address] [place], [place] [address]  Glucose, capillary     Status: Abnormal   Collection Time: 06/21/19  8:34 AM  Result Value Ref Range   Glucose-Capillary 138 (H) 70 - 99 mg/dL  Glucose, capillary     Status: Abnormal   Collection Time: 06/21/19 11:34 AM  Result Value Ref Range   Glucose-Capillary 109 (H) 70 - 99 mg/dL  Glucose, capillary     Status: Abnormal   Collection Time: 06/21/19  4:07 PM  Result Value Ref Range   Glucose-Capillary 134 (H) 70 - 99 mg/dL    Current Facility-Administered Medications  Medication Dose Route Frequency Provider Last Rate Last Dose  . ciprofloxacin (CIPRO) tablet 500 mg  500 mg Oral BID Mirian Mo, MD   500 mg at 06/21/19 1225  . DULoxetine (CYMBALTA) DR capsule 20 mg  20 mg Oral Daily Bland, Scott, DO   20 mg at 06/21/19 0953  . enoxaparin (LOVENOX) injection 40 mg  40 mg Subcutaneous Q24H Beard, Samantha N, DO   40 mg at 06/21/19 1225  . gabapentin (NEURONTIN) capsule 600 mg  600 mg Oral TID Ellwood Dense, DO   600 mg at 06/21/19 1627  . Gerhardt's butt cream   Topical BID Dana Allan, MD      . insulin aspart (novoLOG) injection 0-9 Units  0-9 Units Subcutaneous TID WC Leticia Penna N, DO   1 Units at 06/21/19 1628  . lactobacillus acidophilus (BACID) tablet 2 tablet  2 tablet Oral TID Ellwood Dense, DO   2 tablet at 06/21/19 1627  . metoprolol tartrate (LOPRESSOR) tablet 12.5 mg  12.5 mg Oral BID Leticia Penna N, DO   12.5 mg at 06/21/19 0951  . metroNIDAZOLE (FLAGYL) tablet 500 mg  500 mg Oral Q8H Mirian Mo, MD   500 mg at 06/21/19 1627  . morphine 2 MG/ML injection 1 mg  1 mg Intravenous Q6H PRN Ellwood Dense, DO   1 mg at 06/21/19 0700  . multivitamin with minerals tablet 1 tablet  1 tablet Oral Daily  Moses Manners, MD   1 tablet at 06/21/19 0951  . nutrition supplement (JUVEN) (JUVEN) powder packet 1 packet  1 packet Oral BID BM Moses Manners, MD   1 packet at 06/21/19 [phone]  . ondansetron (ZOFRAN) tablet 4 mg  4 mg Oral Q6H PRN Leticia Penna N, DO       Or  . ondansetron (ZOFRAN) injection 4 mg  4 mg Intravenous Q6H PRN Leticia Penna N, DO      . [START ON 06/22/2019] pneumococcal 23 valent vaccine (PNU-IMMUNE) injection 0.5 mL  0.5 mL Intramuscular Tomorrow-1000 Hensel, William A, MD      . polyethylene glycol (MIRALAX / GLYCOLAX) packet 17 g  17 g Oral Daily Beard, Samantha N, DO   17 g at 06/20/19 0956  . sodium hypochlorite (DAKIN'S 1/4 STRENGTH) topical solution   Irrigation Daily Hensel, Santiago Bumpers, MD        Musculoskeletal: Strength & Muscle Tone: within normal limits Gait & Station: normal Patient leans: N/A  Psychiatric Specialty Exam: Physical Exam  Nursing note and vitals reviewed. Constitutional: She is oriented to person, place, and time. She appears well-developed and well-nourished.  Cardiovascular: Normal rate.  Respiratory: Effort normal.  Musculoskeletal: Normal range of motion.  Neurological: She is alert and oriented to person, place, and time.  Skin: Skin is warm.    Review of Systems  Constitutional: Negative.   HENT: Negative.   Eyes:  Negative.   Respiratory: Negative.   Cardiovascular: Negative.   Gastrointestinal: Negative.   Genitourinary: Negative.   Musculoskeletal: Negative.   Skin: Negative.   Neurological: Negative.   Endo/Heme/Allergies: Negative.   Psychiatric/Behavioral: Positive for depression. Negative for hallucinations, substance abuse and suicidal ideas.    Blood pressure (!) 97/51, pulse 78, temperature 99.8 F (37.7 C), temperature source Oral, resp. rate 17, height 5\' 6"  (1.676 m), weight 74.5 kg, SpO2 100 %.Body mass index is 26.51 kg/m.  General Appearance: Disheveled  Eye Contact:  Good  Speech:  Clear and  Coherent and Normal Rate  Volume:  Normal  Mood:  Depressed  Affect:  Flat  Thought Process:  Coherent and Descriptions of Associations: Intact  Orientation:  Full (Time, Place, and Person)  Thought Content:  WDL  Suicidal Thoughts:  No  Homicidal Thoughts:  No  Memory:  Immediate;   Good Recent;   Good Remote;   Good  Judgement:  Fair  Insight:  Good  Psychomotor Activity:  Normal  Concentration:  Concentration: Good  Recall:  Good  Fund of Knowledge:  Good  Language:  Good  Akathisia:  No  Handed:  Right  AIMS (if indicated):     Assets:  Communication Skills Desire for Improvement Financial Resources/Insurance Resilience  ADL's:  Intact  Cognition:  WNL  Sleep:        Treatment Plan Summary: Medication management  Continue Cymbalta 20 mg PO Daily for depression, encourage titration up  To 40 mg Daily before discharge Patient is psychiatrically cleared  Disposition: No evidence of imminent risk to self or others at present.   Patient does not meet criteria for psychiatric inpatient admission. Supportive therapy provided about ongoing stressors. Discussed crisis plan, support from social network, calling 911, coming to the Emergency Department, and calling Suicide Hotline.  Gerlene Burdockravis B Money, FNP 06/21/2019 4:31 PM

## 2019-06-21 NOTE — Progress Notes (Addendum)
Family Medicine Teaching Service Daily Progress Note Intern Pager: [phone]  Patient name: [name] Medical record number: [id number] Date of birth: 06-22-1951 Age: 68 y.o. Gender: female  Primary Care Provider: Cleophas Dunker, DO Consultants: None Code Status: Full  Pt Overview and Major Events to Date:  10/22-admitted 10/22-IV vanc, cefepime, Flagyl started  Assessment and Plan: [name] is a 68 y.o. female presenting with worsening bilateral lower extremity erythema in the setting of chronic insufficiency with stasis ulcer, suspicious for cellulitis. PMH is significant for chronic venous insufficiency with stasis ulcers, atrial fibrillation, borderline personality disorder, diabetes, normocytic anemia, chronic pain.  Bilateral LE cellulitis, in setting of chronic venous insufficiency with stasis ulcers:  Vitals currently stable.  MRI negative for osteomyelitis.  Vascular has been consulted do not believe this to be arterial, likely distribution of venous insufficiency.  Xray of right lower leg shows soft tissue swelling without definite acute bony abnormalities and soft tissue ulcer at lateral aspect of distal right leg. Morning labs show white blood cell count of 5.3.  Wound culture during last admission showing polymicrobial infection.  -Status post vancomycin and Zosyn in the emergency department -Discontinue IV vancomycin (10/22-10/25) and Cefepime(10/23-10/25 ) -We will narrow antibiotics to ciprofloxacin 100 mg twice daily (10/25-10/29) and Flagyl 500 mg every 6 hours (10/23-10/29) for total of 5 additional days as patient has history of C. difficile. -Plain films ordered of left leg -Arterial Dopplers bilaterally, pending -Follow-up blood cultures-no growth at 2 days -IV morphine 4 mg every 4 hours as needed  -Will irrigation and cleansing of LE -Consult wound care for evaluation, appreciate their attempt to get the appropriate care to this patient despite past  refusals. -PT/OT   Hypokalemia-resolved Current potassium 4.4. -Daily BMP  Paroxysmal atrial fibrillation: Chronic, stable non-RVR EKG on arrival showing atrial flutter/fib.  Heart rate maintaining around 90-110's, may additionally have worsening tachycardia in the setting of current infection.    Asymptomatic CHA2DS2-VASc score 3, however has adamantly refused anticoagulation numerous times in previous hospitalization and outpatient follow-up.  Does not appear that she has been on rate control in the past. -Monitor on telemetry -Continue Metop 12.37m BID for rate control, BP stable, titrate as needed    Borderline personality disorder  MDD Patient verbally abusive to staff this morning.  Unsure if patient has been taking her home trazodone 525m Strong history of being verbally abusive and aggressive.  Evaluated by psychiatry during recent admission in 04/2019 who felt her behavior could be indicative of a personality component.  Started on Cymbalta recently for which she endorsed improved her mood, however discontinued this after discharge. -Patient did want to restart her Cymbalta and wanted to decrease her gabapentin and morphine. -Psychiatry consultation, appreciate recommendations.  CKD, stage IIIa:  Cr 0.72, eGFR >60.  Baseline around 1-1.2. -Monitor BMP  Diabetes mellitus, type II: Recent diagnosis, stable. CBGs low 100-140 last 24hrs, A1c 6.6 in 04/2019.  Does not currently take any medications for this.  Suspect glucose will have worsening in the setting of current infection. -Monitor CBGs with meals and nightly -Sensitive SSI -Can consider starting metformin as an outpatient  High risk social situation: Chronic. Lives by herself, has refused SNF and discharged from numerous home health agencies.  EMS noted poor living situation with cockroaches, bedbugs, and maggots throughout her living space.  Improvement of the situation is complicated by patient's previous behaviors and  compliance. -Social work/care management consult, assess if there is any additional resources we can  provide for her to improve access to care and living situation  Normocytic anemia: Chronic, decreasing but still above transfusion threshold. Currently 8.4, baseline appears to be around 9-11. Suspect this is likely multifactorial, however found to be iron deficient during recent hospitalization with iron saturation <15.  Do not believe that she has been taking her ferrous sulfate at home. 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  Not currently on chronic pain medication, however started on gabapentin and Cymbalta for neuropathic pain during last admission.  Does not appear that she has been continuing to take the Cymbalta. -Patient requested gabapentin reduced to 200 mg 3 times daily from 400 mg 3 times daily -Patient did request restarting Cymbalta.  Constipation: Chronic, stable. Endorses regular bowel movement weekly. -Start MiraLAX daily.  FEN/GI: Carb modified PPx: Lovenox  Disposition: Pending medical work-up  Subjective:  Patient's only complaint this morning is her leg pain.  She states "they are going to be amputated".  When asked who told her this she states "no one, I want to have them amputated".  Patient verbally abusive towards staff during my interview with her this morning.  Wound care attempting to care for patient's legs.     Objective: Temp:  [98 F (36.7 C)-98.5 F (36.9 C)] 98 F (36.7 C) (10/24 2150) Pulse Rate:  [84-102] 93 (10/24 2324) Resp:  [17-25] 17 (10/24 2150) BP: (92-107)/(47-68) 99/68 (10/24 2324)  Physical Exam: General: Alert and oriented in no apparent distress Heart: Irregular rhythm with no murmurs Lungs: CTA bilaterally, no wheezing Abdomen: Bowel sounds present, no  abdominal pain Skin: Warm and dry Extremities: Lower extremity edema improved bilaterally from my exam a few days ago, still with erythema and ulceration/scabbing.  Laboratory: Recent Labs  Lab 06/18/19 0802 06/19/19 0636 06/21/19 0418  WBC 9.4 5.3 3.9*  HGB 9.9* 8.7* 8.4*  HCT 32.6* 28.4* 27.5*  PLT 393 281 283   Recent Labs  Lab 06/18/19 0802 06/19/19 0636 06/21/19 0418  NA 137 138 137  K 3.2* 3.0* 4.4  CL 99 106 103  CO2 '25 23 26  ' BUN '22 16 21  ' CREATININE 1.18* 1.01* 0.72  CALCIUM 8.8* 8.2* 8.3*  PROT 7.4 6.0*  --   BILITOT 1.2 0.8  --   ALKPHOS 71 52  --   ALT 21 17  --   AST 32 25  --   GLUCOSE 167* 125* 124*      Imaging/Diagnostic Tests: Dg Tibia/fibula Left  Result Date: 06/20/2019 CLINICAL DATA:  Worsening erythema, suspected cellulitis EXAM: LEFT TIBIA AND FIBULA - 2 VIEW COMPARISON:  05/04/2019 FINDINGS: No fracture or dislocation of the left tibia or fibula. Knee joint arthrosis. Soft tissue edema is decreased in comparison to examination dated 05/04/2019. IMPRESSION: No fracture or dislocation of the left tibia or fibula. Soft tissue edema is decreased in comparison to examination dated 05/04/2019. Electronically Signed   By: Eddie Candle M.D.   On: 06/20/2019 16:08    Lurline Del, DO 06/21/2019, 6:39 AM PGY-1, [place] Intern pager: [phone], text pages welcome

## 2019-06-21 NOTE — TOC Initial Note (Signed)
Transition of Care [facility]) - Initial/Assessment Note    Patient Details  Name: [name] MRN: [id number] Date of Birth: 23-Dec-1950  [facility] CM/SW Contact:    Bess Kinds, RN Phone Number: [phone] 06/21/2019, 10:28 AM  Clinical Narrative:                 CM consult for discharge planning acknowledged. Spoke with MD via phone to discuss patient situation. Last admission this NCM assisted patient with expediting PCS services through Medicaid. However, patient behaviors prevented activation of needed services. Family Practice social worker attempted to assist patient after discharge, but inquired about inpatient medical management of medications while previously admitted. Discussed patient behaviors and possible medication or psychiatric follow up - MD to address. TOC following for transition needs.     Barriers to Discharge: Continued Medical Work up   Patient Goals and CMS Choice        Expected Discharge Plan and Services       Post Acute Care Choice: Durable Medical Equipment(wheelchair and rollator) Living arrangements for the past 2 months: Apartment                                      Prior Living Arrangements/Services Living arrangements for the past 2 months: Apartment Lives with:: Self                   Activities of Daily Living Home Assistive Devices/Equipment: Environmental consultant (specify type) ADL Screening (condition at time of admission) Patient's cognitive ability adequate to safely complete daily activities?: Yes Is the patient deaf or have difficulty hearing?: No Does the patient have difficulty seeing, even when wearing glasses/contacts?: No Does the patient have difficulty concentrating, remembering, or making decisions?: No Patient able to express need for assistance with ADLs?: Yes Does the patient have difficulty dressing or bathing?: Yes Independently performs ADLs?: Yes (appropriate for developmental age) Does the patient  have difficulty walking or climbing stairs?: Yes Weakness of Legs: Both Weakness of Arms/Hands: None  Permission Sought/Granted                  Emotional Assessment              Admission diagnosis:  Elevated C-reactive protein (CRP) [R79.82] Leg ulcer (HCC) [L97.909] Cellulitis of left lower extremity [L03.116] Cellulitis of right lower extremity [L03.115] Venous stasis ulcer, unspecified site, unspecified ulcer stage, unspecified whether varicose veins present (HCC) [I83.009, L97.909] Patient Active Problem List   Diagnosis Date Noted  . Pressure injury of skin 06/19/2019  . Cellulitis of left lower extremity   . Elevated C-reactive protein (CRP)   . Cellulitis of right lower extremity 06/18/2019  . Leg ulcer (HCC)   . Adjustment disorder with mixed disturbance of emotions and conduct   . Type 2 diabetes mellitus with hyperglycemia (HCC) 05/04/2019  . AKI (acute kidney injury) (HCC)   . Infected ulcer of skin (HCC)   . High risk social situation   . Post-polio syndrome   . Chronic pain syndrome   . Limited mobility 04/20/2019  . Venous stasis ulcer (HCC) 03/31/2019  . Overgrown toenails 10/28/2018  . Wound of right leg 02/06/2018  . Atrial fibrillation (HCC) 01/03/2018  . Anemia 01/03/2018  . Elevated serum creatinine 01/03/2018  . Bipolar I disorder with mania (HCC) 01/03/2018   PCP:  [name], DO Pharmacy:   Patrcia Dolly  [facility] [place], [place] [address] [place] [address] Phone: [phone] Fax: [place], [place] [address] Phone: [phone] Fax: [phone]     Social Determinants of Health (SDOH) Interventions    Readmission Risk Interventions No flowsheet data found.

## 2019-06-21 NOTE — Progress Notes (Signed)
Pictures are again consistent with venous ulcers.  Patient has palpable pedal pulses and does not appear to need arterial revascularization.  If non-compliant with [employer], alternative would be Vive Wear silver and copper impregnated compression stockings that can be applied on open wounds.  Discussed with Dr. Sharol Given and ok for open wounds.  Call Dr. Sharol Given if questions to getting stockings.  Marty Heck, MD [facility] Office: [phone] Pager: [place]

## 2019-06-21 NOTE — Progress Notes (Signed)
CSW met with patient with RN Case Manager to discuss discharging planning needs. Patient informed CSW that she does not want to go to a SNF.Marland KitchenPatient became agiated and stated that she did not want to discuss her discharge needs anymore.   Consult is being closed out.

## 2019-06-21 NOTE — Discharge Summary (Signed)
[facility] Discharge Summary  Patient name: [name] Medical record number: [id number] Date of birth: 12/25/1950 Age: 68 y.o. Gender: female Date of Admission: 06/18/2019  Date of Discharge: 06/28/19 Admitting Physician: Moses MannersWilliam A Hensel, MD  Primary Care Provider: Unknown [name], [name], DO Consultants: Vascular, psych  Indication for Hospitalization: Leg pain  Discharge Diagnoses/Problem List:  Principal Problem:   Cellulitis of right lower extremity Active Problems:   Pressure injury of skin   Cellulitis of left lower extremity   Elevated C-reactive protein (CRP)   Depression, major, recurrent, mild (HCC)  Disposition: SNF  Discharge Condition: Stable  Discharge Exam:  Please see progress note   Brief Hospital Course:  Archie EndoSusan M Wellsis a 68 y.o.femalepresenting with worsening bilateral lower extremity erythema in the setting of chronic insufficiency with stasis ulcer, suspicious for cellulitis.  Leg pain/stasis ulcer/cellulitis Patient is a 68 year old female that presented to the emergency department with nonhealing right pretibial ulcer with erythema of both lower limbs secondary to chronic venous stasis.  Patient's wounds were irrigated and cleaned with wound care being consulted.  Patient was initially started on IV vancomycin was transitioned to cefepime and Flagyl and eventually transitioned to Flagyl and Cipro has erythema improved.  Trays were obtained of the right tibial/fibula to assess for osteomyelitis.  These did not show definitive evidence of this but had some questionable changes so an MRI was obtained.  The MRI was negative for osteomyelitis.  Vascular was consulted who determined that these changes were likely related to venous stasis and were not related to arterial issues.  Vascular recommended the patient continue with an Unna boot to increase the prospects of healing.  Patient will be discharged with Unna boot to be changed  weekly.  Patient completed her 8-day course of Flagyl and ciprofloxacin prior to discharge.  Borderline personality disorder Throughout the hospitalization patient was regularly verbally abusive towards staff often using profanity and yelling at staff.  Psychiatry was consulted to assess for potential medication management.  The recommendations were to increase the Cymbalta to 40 mg/day.  Social concerns: Homelessness Prior to discharge patient received word that her previous place of residence, [facility] had evicted her due to hygiene issues.  Social worker was brought on board and were successful in assisting place the patient with skilled nursing facility.  Issues for Follow Up:  1. Patient should follow up weekly to have her Unna boot changed 2. Patient was started on metoprolol due to paroxysmal A. fib.  Significant Procedures: None  Significant Labs and Imaging:  Recent Labs  Lab 06/25/19 0411 06/26/19 0342 06/27/19 0809  WBC 5.7 5.6 5.1  HGB 9.1* 8.9* 9.1*  HCT 30.5* 29.8* 30.2*  PLT 328 295 260   Recent Labs  Lab 06/23/19 0358 06/24/19 0513 06/25/19 0411 06/26/19 0342 06/27/19 0809  NA 137 137 137 138 137  K 4.3 4.3 4.2 4.4 4.5  CL 101 100 101 100 100  CO2 29 30 28 29 27   GLUCOSE 137* 113* 89 92 103*  BUN 22 23 28* 30* 23  CREATININE 0.94 1.03* 0.96 1.10* 0.96  CALCIUM 8.6* 8.4* 8.5* 8.7* 8.5*    Dg Tibia/fibula Left  Result Date: 06/20/2019 CLINICAL DATA:  Worsening erythema, suspected cellulitis EXAM: LEFT TIBIA AND FIBULA - 2 VIEW COMPARISON:  05/04/2019 FINDINGS: No fracture or dislocation of the left tibia or fibula. Knee joint arthrosis. Soft tissue edema is decreased in comparison to examination dated 05/04/2019. IMPRESSION: No fracture or dislocation of  the left tibia or fibula. Soft tissue edema is decreased in comparison to examination dated 05/04/2019. Electronically Signed   By: Lauralyn Primes M.D.   On: 06/20/2019 16:08   Mr Tibia Fibula Right Wo  Contrast  Result Date: 06/19/2019 CLINICAL DATA:  Bilateral leg pain, question of osteomyelitis EXAM: MRI OF LOWER RIGHT EXTREMITY WITHOUT CONTRAST TECHNIQUE: Multiplanar, multisequence MR imaging of the right was performed. No intravenous contrast was administered. COMPARISON:  None. FINDINGS: Bones/Joint/Cartilage Normal osseous marrow signal is seen throughout. No cortical destruction or periosteal reaction. No a vascular necrosis or fracture is seen. No large knee joint effusion is seen. Ligaments Suboptimally visualized Muscles and Tendons There is increased fluid signal seen at the fibular is longus muscle belly at the level of the distal lower extremity best seen on series 8, image 49. Increased feathery signal seen within the anterior muscular compartment and the tibialis posterior muscle belly. There is mild fatty atrophy noted within the muscles surrounding the lower extremity. The tendons appear to be intact. Soft tissues There is a focal area of superficial ulceration overlying the anterolateral aspect of the lower extremity. There is diffuse skin thickening with subcutaneous edema. No loculated fluid collection however is noted. IMPRESSION: 1. Intramuscular strain/myositis of the fibularis longus. 2. Muscular edema involving the anterior muscular compartment and tibialis posterior. 3. No definite evidence of osteomyelitis. 4. Focal areas of superficial ulceration and edema which could be due to cellulitis involving the mid to distal lower extremity. Electronically Signed   By: Jonna Clark M.D.   On: 06/19/2019 23:20   Dg Chest Portable 1 View  Result Date: 06/18/2019 CLINICAL DATA:  Patient to ED via EMS, reports worsening pain at buttocks and vagina for several days, denies injury or fall, EMS witnessed poor living condition in her apartment with roaches, bed bugs and maggots at buttocks. Patient added deep.*comment was truncated*shortness of breath EXAM: PORTABLE CHEST 1 VIEW COMPARISON:   None. FINDINGS: Normal mediastinum and cardiac silhouette. Normal pulmonary vasculature. No evidence of effusion, infiltrate, or pneumothorax. No acute bony abnormality. Remote LEFT humeral fracture IMPRESSION: No acute cardiopulmonary process. Electronically Signed   By: Genevive Bi M.D.   On: 06/18/2019 08:14   Dg Tibia/fibula Right Port  Result Date: 06/18/2019 CLINICAL DATA:  Leg ulcer EXAM: PORTABLE RIGHT TIBIA AND FIBULA - 2 VIEW COMPARISON:  05/04/2019 FINDINGS: Separate AP views of the proximal and distal RIGHT lower leg. Patient uncooperative for additional imaging, no lateral view obtained. Osseous demineralization. Knee and ankle joint spaces preserved. Diffuse soft tissue swelling of the RIGHT lower leg with large area of soft tissue irregularity and ulceration at the lateral aspect of the distal RIGHT lower leg. No definite fracture, dislocation or bone destruction identified on limited assessment. IMPRESSION: Soft tissue swelling without definite acute bony abnormalities on with assessment. Soft tissue ulcer at lateral aspect of distal RIGHT lower leg. Electronically Signed   By: Ulyses Southward M.D.   On: 06/18/2019 12:32   Results/Tests Pending at Time of Discharge: none  Discharge Medications:  Allergies as of 06/28/2019      Reactions   Codeine Other (See Comments)   "Gets high"   Tylenol [acetaminophen] Hives      Medication List    TAKE these medications   DULoxetine HCl 40 MG Cpep Take 40 mg by mouth daily. What changed:   medication strength  how much to take   ferrous sulfate 325 (65 FE) MG tablet Take 1 tablet (325 mg total) by  mouth daily with breakfast.   gabapentin 300 MG capsule Commonly known as: NEURONTIN Take 2 capsules (600 mg total) by mouth 3 (three) times daily. What changed:   medication strength  how much to take   Gerhardt's butt cream Crea Apply 1 application topically 2 (two) times daily.   hydrOXYzine 25 MG tablet Commonly known as:  ATARAX/VISTARIL Take 1 tablet (25 mg total) by mouth 3 (three) times daily as needed for itching, anxiety, nausea or vomiting (Leg pain - likely related to itching with unna boot).   ibuprofen 200 MG tablet Commonly known as: ADVIL Take 400 mg by mouth every 6 (six) hours as needed for moderate pain.   metoprolol tartrate 25 MG tablet Commonly known as: LOPRESSOR Take 0.5 tablets (12.5 mg total) by mouth 2 (two) times daily.   naproxen sodium 220 MG tablet Commonly known as: ALEVE Take 220 mg by mouth daily as needed (pain).   nutrition supplement (JUVEN) Pack Take 1 packet by mouth 2 (two) times daily between meals.   traZODone 50 MG tablet Commonly known as: DESYREL Take 1 tablet (50 mg total) by mouth at bedtime.      Discharge Instructions: Please refer to Patient Instructions section of EMR for full details.  Patient was counseled important signs and symptoms that should prompt return to medical care, changes in medications, dietary instructions, activity restrictions, and follow up appointments.   Follow-Up Appointments: Follow-up [place] Follow up on 06/29/2019.   Why: @ 11:00am Contact information: [address]          Wilber Oliphant, MD 06/28/2019, 6:57 AM PGY-1, [place]

## 2019-06-21 NOTE — Progress Notes (Signed)
Pharmacy Antibiotic Note  [name] is a 68 y.o. female admitted on 06/18/2019 with cellulitis of leg.  Pharmacy has been consulted for cefepime and vancomycin dosing.  WBC down to 3.9, afebrile. SCr improved, decreased from 1.01 to 0.72. Blood cultures from 10/22 are no growth to date.  New vancomycin dose with goal AUC 400-500. SCr 0.72 using IBW, calculated AUC 447.   Plan: Increase vancomycin to 1500 mg IV every 24 hours. Continue cefepime 2g IV every 12 hours Continue metronidazole 500 mg IV every 8 hours per MD Will continue to monitor renal function, follow cultures, LOT, de-escalation of antibiotics.  Height: 5\' 6"  (167.6 cm) Weight: 164 lb 3.9 oz (74.5 kg) IBW/kg (Calculated) : 59.3  Temp (24hrs), Avg:98.3 F (36.8 C), Min:98 F (36.7 C), Max:98.5 F (36.9 C)  Recent Labs  Lab 06/18/19 0802 06/19/19 0636 06/21/19 0418  WBC 9.4 5.3 3.9*  CREATININE 1.18* 1.01* 0.72  LATICACIDVEN 1.4  --   --     Estimated Creatinine Clearance: 69.5 mL/min (by C-G formula based on SCr of 0.72 mg/dL).    Allergies  Allergen Reactions  . Codeine Other (See Comments)    "Gets high"  . Tylenol [Acetaminophen] Hives    Antimicrobials this admission: Vancomycin 10/22 >> Zosyn 10/22 x 1 Cefepime 10/22 >> Metronidazole 10/22 >>  Microbiology results: 10/22 BCx: NGTD 10/22 COVID: negative    Thank you for allowing pharmacy to participate in this patient's care.  Laketra Bowdish L. Devin Going, [place] PGY1 Pharmacy Resident 06/21/19      9:43 AM  Please check AMION for all [facility] phone numbers After 10:00 PM, call the [place] [phone]

## 2019-06-21 NOTE — Evaluation (Signed)
Occupational Therapy Evaluation Patient Details Name: [name] MRN: [id number] DOB: [birth year] Today's Date: 06/21/2019    History of Present Illness 68 yo female with onset of significant wounds and ulcerations of LE's along with cellulitis was admitted for management of her infection process.  Pt has been home with inadequate care but has been refusing a rehab setting.  PMHx:  L humeral fracture, venous insufficiency, PAF, bipolar and borderline personality disorder, CKD3, DM, polio, c-diff, living with bedbugs and other pests   Clinical Impression   Pt presents with above dx. Pt PTA: Pt was living alone in poor conditions and unable to properly care for self per notes in chart.  Currently, pt set-upA for UB ADL in sitting and modA to maxA for LB ADL in sitting orr at bed level. HR tachy 121-157 BPM with exertion at EOB. Pt [place] for bed mobility to EOB. Pt refusing OOB transfer or further ADL. Pt mostly unagreeable to therapy stating "I just want to live in a NH, but I don't want to have neighbors." OT following acutely for more cognitive strategies and for OOB ADL.      Follow Up Recommendations  SNF;Supervision/Assistance - 24 hour    Equipment Recommendations  Other (comment)(to be determined at next venue)    Recommendations for Other Services       Precautions / Restrictions Precautions Precautions: Fall Precaution Comments: pt has painful and ulcerated lower legs Restrictions Weight Bearing Restrictions: No      Mobility Bed Mobility Overal bed mobility: Needs Assistance Bed Mobility: Supine to Sit;Sit to Supine     Supine to sit: Mod assist Sit to supine: Mod assist   General bed mobility comments: mod to support legs and then assist trunk  Transfers                 General transfer comment: refused due to leg pain    Balance Overall balance assessment: History of Falls;Needs assistance Sitting-balance support: Feet unsupported;Bilateral upper  extremity supported Sitting balance-Leahy Scale: Good Sitting balance - Comments: Pt reported wanting to eat her lunch sitting EOB so pt stated upright for atleast 15 mins while CSW talking with pt prior to OTR leaving. RN made aware.                                   ADL either performed or assessed with clinical judgement   ADL Overall ADL's : Needs assistance/impaired Eating/Feeding: Set up;Sitting   Grooming: Set up;Sitting   Upper Body Bathing: Set up;Sitting   Lower Body Bathing: Moderate assistance;Sitting/lateral leans;Bed level   Upper Body Dressing : Set up;Sitting   Lower Body Dressing: Moderate assistance;Maximal assistance;Sitting/lateral leans;Bed level     Toilet Transfer Details (indicate cue type and reason): refusing Toileting- Clothing Manipulation and Hygiene: Maximal assistance;Sitting/lateral lean;Bed level       Functional mobility during ADLs: Moderate assistance;Cueing for safety General ADL Comments: set-upA for UB ADL in sitting and modA to maxA for LB ADL in sitting orr at bedlevel.     Vision Baseline Vision/History: No visual deficits Vision Assessment?: No apparent visual deficits     Perception     Praxis      Pertinent Vitals/Pain Pain Assessment: Faces Faces Pain Scale: Hurts little more Pain Location: BLEs, R>L Pain Descriptors / Indicators: Discomfort;Contraction Pain Intervention(s): Limited activity within patient's tolerance;Monitored during session     Hand Dominance Right   Extremity/Trunk  Assessment Upper Extremity Assessment Upper Extremity Assessment: Generalized weakness   Lower Extremity Assessment Lower Extremity Assessment: Generalized weakness   Cervical / Trunk Assessment Cervical / Trunk Assessment: Kyphotic   Communication Communication Communication: No difficulties   Cognition Arousal/Alertness: Awake/alert Behavior During Therapy: Agitated Overall Cognitive Status: History of cognitive  impairments - at baseline                                 General Comments: poor insight into poor living status and inability to care for self   General Comments  HR tachy 121-157 with exertion at EOB    Exercises     Shoulder Instructions      Home Living Family/patient expects to be discharged to:: Private residence Living Arrangements: Alone Available Help at Discharge: Friend(s);Available PRN/intermittently Type of Home: Apartment Home Access: Elevator     Home Layout: One level     Bathroom Shower/Tub: Tub/shower unit;Curtain   FirefighterBathroom Toilet: Standard     Home Equipment: Wheelchair - Firefightermanual;Walker - 2 wheels   Additional Comments: Lives [facility]      Prior Functioning/Environment Level of Independence: Needs assistance  Gait / Transfers Assistance Needed: walks with minimal distance as she is usually just up in wheelchair ADL's / Homemaking Assistance Needed: has not been taking care of herself with hygiene and wound care            OT Problem List: Decreased strength;Impaired balance (sitting and/or standing);Decreased activity tolerance;Decreased cognition;Decreased safety awareness;Impaired UE functional use;Pain;Increased edema      OT Treatment/Interventions: Self-care/ADL training;Therapeutic exercise;Energy conservation;Therapeutic activities;Patient/family education;Balance training    OT Goals(Current goals can be found in the care plan section) Acute Rehab OT Goals Patient Stated Goal: to get pain to go away and get home OT Goal Formulation: With patient Time For Goal Achievement: 07/05/19 Potential to Achieve Goals: Good ADL Goals Pt Will Perform Grooming: with modified independence;sitting Pt Will Perform Lower Body Dressing: with min assist;sit to/from stand Pt Will Transfer to Toilet: with min assist;stand pivot transfer Pt/caregiver will Perform Home Exercise Program: Increased strength;Both right and left upper  extremity;With written HEP provided  OT Frequency: Min 2X/week   Barriers to D/C: Decreased caregiver support  lives alone "all of my family is deceased."       Co-evaluation              AM-PAC OT "6 Clicks" Daily Activity     Outcome Measure Help from another person eating meals?: None Help from another person taking care of personal grooming?: A Little Help from another person toileting, which includes using toliet, bedpan, or urinal?: A Lot Help from another person bathing (including washing, rinsing, drying)?: A Lot Help from another person to put on and taking off regular upper body clothing?: A Little Help from another person to put on and taking off regular lower body clothing?: A Lot 6 Click Score: 16   End of Session Nurse Communication: Mobility status  Activity Tolerance: Patient limited by pain;Other (comment);Treatment limited secondary to agitation(self limiting) Patient left: in bed;with call bell/phone within reach;with bed alarm set;Other (comment)(CSW in room)  OT Visit Diagnosis: Unsteadiness on feet (R26.81);Muscle weakness (generalized) (M62.81);Repeated falls (R29.6);Pain Pain - Right/Left: Right Pain - part of body: Leg                Time: 1416-1446 OT Time Calculation (min): 30 min Charges:  OT General Charges $OT  Visit: 1 Visit OT Evaluation $OT Eval Moderate Complexity: 1 Mod OT Treatments $Self Care/Home Management : 8-22 mins  Cristi Loron) Glendell Docker OTR/L Acute Rehabilitation Services Pager: [phone] Office: [phone]   Lonzo Cloud 06/21/2019, 4:04 PM

## 2019-06-22 DIAGNOSIS — R102 Pelvic and perineal pain: Secondary | ICD-10-CM | POA: Diagnosis not present

## 2019-06-22 DIAGNOSIS — L03115 Cellulitis of right lower limb: Secondary | ICD-10-CM | POA: Diagnosis not present

## 2019-06-22 DIAGNOSIS — F330 Major depressive disorder, recurrent, mild: Secondary | ICD-10-CM | POA: Diagnosis not present

## 2019-06-22 DIAGNOSIS — I83009 Varicose veins of unspecified lower extremity with ulcer of unspecified site: Secondary | ICD-10-CM | POA: Diagnosis not present

## 2019-06-22 DIAGNOSIS — L03116 Cellulitis of left lower limb: Secondary | ICD-10-CM | POA: Diagnosis not present

## 2019-06-22 LAB — BASIC METABOLIC PANEL WITH GFR
Anion gap: 9 (ref 5–15)
BUN: 16 mg/dL (ref 8–23)
CO2: 28 mmol/L (ref 22–32)
Calcium: 8.6 mg/dL — ABNORMAL LOW (ref 8.9–10.3)
Chloride: 101 mmol/L (ref 98–111)
Creatinine, Ser: 0.92 mg/dL (ref 0.44–1.00)
GFR calc Af Amer: >60 mL/min
GFR calc non Af Amer: >60 mL/min
Glucose, Bld: 148 mg/dL — ABNORMAL HIGH (ref 70–99)
Potassium: 4 mmol/L (ref 3.5–5.1)
Sodium: 138 mmol/L (ref 135–145)

## 2019-06-22 LAB — GLUCOSE, CAPILLARY
Glucose-Capillary: 123 mg/dL — ABNORMAL HIGH (ref 70–99)
Glucose-Capillary: 109 mg/dL — ABNORMAL HIGH (ref 70–99)
Glucose-Capillary: 126 mg/dL — ABNORMAL HIGH (ref 70–99)
Glucose-Capillary: 130 mg/dL — ABNORMAL HIGH (ref 70–99)

## 2019-06-22 LAB — CBC
HCT: 30.2 % — ABNORMAL LOW (ref 36.0–46.0)
Hemoglobin: 9.3 g/dL — ABNORMAL LOW (ref 12.0–15.0)
MCH: 28.2 pg (ref 26.0–34.0)
MCHC: 30.8 g/dL (ref 30.0–36.0)
MCV: 91.5 fL (ref 80.0–100.0)
Platelets: 325 10*3/uL (ref 150–400)
RBC: 3.3 MIL/uL — ABNORMAL LOW (ref 3.87–5.11)
RDW: 16.2 % — ABNORMAL HIGH (ref 11.5–15.5)
WBC: 6 10*3/uL (ref 4.0–10.5)
nRBC: 0 % (ref 0.0–0.2)

## 2019-06-22 MED ORDER — DULOXETINE HCL 20 MG PO CPEP
40.0000 mg | ORAL_CAPSULE | Freq: Every day | ORAL | Status: DC
  Administered 2019-06-23 – 2019-06-29 (×7): 40 mg via ORAL
  Filled 2019-06-22 (×7): qty 2

## 2019-06-22 MED ORDER — ALUM & MAG HYDROXIDE-SIMETH 200-200-20 MG/5 ML NICU TOPICAL: 1.0000 "application " | TOPICAL | Status: DC | PRN

## 2019-06-22 MED ORDER — ALUM & MAG HYDROXIDE-SIMETH 200-200-20 MG/5ML PO SUSP
30.0000 mL | Freq: Four times a day (QID) | ORAL | Status: DC | PRN
  Administered 2019-06-25: 30 mL via ORAL
  Filled 2019-06-22: qty 30

## 2019-06-22 NOTE — Discharge Instructions (Addendum)
Discharge instructions:  You were admitted due to cellulitis in your lower limbs as well as ulceration.  It is very important that you follow-up with our clinic weekly to have your Unna boot changed to improve your legs healing.    -Please follow-up with your primary care doctor on 06/29/19 as scheduled.  - Please return if you experience similar symptoms, high fevers, confusion, chest pain that does not subside within 15 minutes or shortness of breath.

## 2019-06-22 NOTE — Progress Notes (Signed)
PT Cancellation Note  Patient Details Name: [name] MRN: [id number] DOB: 12/31/1950   Cancelled Treatment:    Reason Eval/Treat Not Completed: Patient declined, no reason specified   Shary Decamp [facility] 06/22/2019, 3:19 PM [place] Pager [phone] Office [phone]

## 2019-06-22 NOTE — Progress Notes (Signed)
Family Medicine Teaching Service Daily Progress Note Intern Pager: [phone]  Patient name: [name] Medical record number: [id number] Date of birth: 1950/09/23 Age: 68 y.o. Gender: female  Primary Care Provider: Cleophas Dunker, DO Consultants: None Code Status: Full  Pt Overview and Major Events to Date:  10/22-admitted 10/22-IV vanc, cefepime, Flagyl started  Assessment and Plan: [name] is a 68 y.o. female presenting with worsening bilateral lower extremity erythema in the setting of chronic insufficiency with stasis ulcer, suspicious for cellulitis. PMH is significant for chronic venous insufficiency with stasis ulcers, atrial fibrillation, borderline personality disorder, diabetes, normocytic anemia, chronic pain.  Bilateral LE cellulitis, in setting of chronic venous insufficiency with stasis ulcers:  Vitals currently stable.  MRI negative for osteomyelitis.  Vascular has been consulted do not believe this to be arterial, likely distribution of venous insufficiency.  Xray of right lower leg shows soft tissue swelling without definite acute bony abnormalities and soft tissue ulcer at lateral aspect of distal right leg. Morning labs show white blood cell count of 5.3.  Wound culture during last admission showing polymicrobial infection.  -Status post vancomycin and Zosyn in the emergency department -Discontinue IV vancomycin (10/22-10/25) and Cefepime(10/23-10/25 ) -We will narrow antibiotics to ciprofloxacin 100 mg twice daily (10/25-10/29) and Flagyl 500 mg every 6 hours (10/23-10/29) for total of 5 additional days as patient has history of C. difficile. -Arterial Dopplers bilaterally, pending -Follow-up blood cultures-no growth currently, 3 days -IV morphine 4 mg every 4 hours as needed  -Will irrigation and cleansing of LE -Vascular surgery consulted  -recommend Unna boot, if uncompliant alternative Vive wear silver and copper impregnated compression  stockings. -Consult wound care for evaluation, appreciate their attempt to get the appropriate care to this patient despite past refusals. -PT/OT - recommended snf, patient refusal   Hypokalemia-resolved Most recent potassium 4.4. -Daily BMP  Paroxysmal atrial fibrillation: Chronic, stable non-RVR EKG on arrival showing atrial flutter/fib.  Heart rate maintaining around 90-110's, may additionally have worsening tachycardia in the setting of current infection.    Asymptomatic CHA2DS2-VASc score 3, however has adamantly refused anticoagulation numerous times in previous hospitalization and outpatient follow-up.  Does not appear that she has been on rate control in the past. -Continue Metop 12.84m BID for rate control, BP stable, titrate as needed    Borderline personality disorder  MDD Patient verbally abusive to staff this morning.  Unsure if patient has been taking her home trazodone 592m Strong history of being verbally abusive and aggressive.  Evaluated by psychiatry during recent admission in 04/2019 who felt her behavior could be indicative of a personality component.  Started on Cymbalta recently for which she endorsed improved her mood, however discontinued this after discharge. -Psychiatry consultation, appreciate recommendations.  -Recommend increasing Cymbalta to 40 mg/day  CKD, stage IIIa:  Most recent Cr 0.72, eGFR >60.  Baseline around 1-1.2. -Monitor BMP  Diabetes mellitus, type II: Recent diagnosis, stable. CBGs low 109-144 last 24hrs, A1c 6.6 in 04/2019.  Does not currently take any medications for this.  Suspect glucose will have worsening in the setting of current infection. -Monitor CBGs with meals and nightly -Sensitive SSI -Can consider starting metformin as an outpatient  High risk social situation: Chronic. Lives by herself, has refused SNF and discharged from numerous home health agencies.  EMS noted poor living situation with cockroaches, bedbugs, and maggots  throughout her living space.  Improvement of the situation is complicated by patient's previous behaviors and compliance. -Social work/care management consult,  assess if there is any additional resources we can provide for her to improve access to care and living situation - Patient refusing snf.  Normocytic anemia: Chronic, decreasing but still above transfusion threshold. Most recent hemoglobin 8.4, baseline appears to be around 9-11. Suspect this is likely multifactorial, however found to be iron deficient during recent hospitalization with iron saturation <15.  Do not believe that she has been taking her ferrous sulfate at home. 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  Not currently on chronic pain medication, however started on gabapentin and Cymbalta for neuropathic pain during last admission.  Does not appear that she has been continuing to take the Cymbalta. -Patient requested gabapentin reduced to 200 mg 3 times daily from 400 mg 3 times daily -Per psych, restart Cymbalta and titrate up to 40 mg/day.  Constipation: Chronic, stable. Endorses regular bowel movement weekly. -Start MiraLAX daily.  FEN/GI: Carb modified PPx: Lovenox  Disposition: Pending medical work-up  Subjective:  Patient appears to be in better spirits today.  She had apparently refused the option of SNF yesterday.  States that she mainly wants to get back home.   Objective: Temp:  [99.3 F (37.4 C)-99.8 F (37.7 C)] 99.3 F (37.4 C) (10/25 2115) Pulse Rate:  [75-80] 75 (10/25 2338) Resp:  [16] 16 (10/25 2115) BP: (97-117)/(40-71) 102/71 (10/25 2338) SpO2:  [100 %] 100 % (10/25 2115)  Physical Exam: General: Alert and oriented in no apparent distress Heart: S1, S2 with no murmurs appreciated. Lungs: CTA bilaterally, no  wheezing Abdomen: Bowel sounds present, no abdominal pain Extremities: Lower limbs wrapped this morning.  Laboratory: Recent Labs  Lab 06/18/19 0802 06/19/19 0636 06/21/19 0418  WBC 9.4 5.3 3.9*  HGB 9.9* 8.7* 8.4*  HCT 32.6* 28.4* 27.5*  PLT 393 281 283   Recent Labs  Lab 06/18/19 0802 06/19/19 0636 06/21/19 0418  NA 137 138 137  K 3.2* 3.0* 4.4  CL 99 106 103  CO2 '25 23 26  ' BUN '22 16 21  ' CREATININE 1.18* 1.01* 0.72  CALCIUM 8.8* 8.2* 8.3*  PROT 7.4 6.0*  --   BILITOT 1.2 0.8  --   ALKPHOS 71 52  --   ALT 21 17  --   AST 32 25  --   GLUCOSE 167* 125* 124*      Imaging/Diagnostic Tests: No results found.  Lurline Del, DO 06/22/2019, 6:13 AM PGY-1, [place] Intern pager: [phone], text pages welcome

## 2019-06-22 NOTE — Progress Notes (Signed)
Orthopedic Tech Progress Note Patient Details:  [name] 12-13-1950 [id number]  Ortho Devices Type of Ortho Device: Ace wrap, Unna boot Ortho Device/Splint Location: Bilateral unna boots Ortho Device/Splint Interventions: Application   Post Interventions Patient Tolerated: Well Instructions Provided: Care of device   [name] 06/22/2019, 4:19 PM

## 2019-06-22 NOTE — Progress Notes (Signed)
Family Medicine Teaching Service Daily Progress Note Intern Pager: [phone]  Patient name: [name] Medical record number: [id number] Date of birth: 1951-01-10 Age: 68 y.o. Gender: female  Primary Care Provider: Cleophas Dunker, DO Consultants: Vascular surgery, Psych Code Status: Full  Pt Overview and Major Events to Date:  10/22-admitted to FPTS, RLE x-ray: soft tissue swelling without definite acute bony abnormalities and soft tissue ulcer at lateral aspect of distal right leg 10/23 MRI R tibia/fibula negative for osteo, VVS consulted - not arterial   Antibiotics Course: Vanc (10/22-10/25) Zosyn (10/22) Cefepime (10/23-10/25) Cipro (10/25-1029) Flagyl (10/23-10/29)  Assessment and Plan: [name] is a 68 y.o. female presenting with worsening bilateral lower extremity erythema in the setting of chronic insufficiency with stasis ulcer, suspicious for cellulitis. PMH is significant for chronic venous insufficiency with stasis ulcers, atrial fibrillation, borderline personality disorder, diabetes, normocytic anemia, chronic pain.  Social Concerns: Homelessness Prior to admission, patient lived alone. Patient recently determined to be homeless and unable to return to prior living situation. Has history of being discharged from numerous home health agencies and refusing SNF. CSW consulted and working on SNF placement. - continue to follow up CSW   Bilateral LE cellulitis, in setting of chronic venous insufficiency with stasis ulcers:  Remains stable on Cipro and flagyl. Labs WNL. Blood culture negative x 4 days. S/p Unna boot placement. Required zero doses of Morphine overnight. Wound culture during last admission showing polymicrobial infection.  - VVS consulted, not arterial, likely distribution of venous insufficiency  - If noncompliant with unna boot, alternative with Vive wear silver and copper impregnated compression stockings - Continue Cipro 100mg  BID (10/25-10/29)  and Flagyl 500mg  q8h (10/23-10/29)   - H/o c. Diff during last admission  -Arterial Dopplers bilaterally, pending -Follow-up blood cultures  -IV morphine 4 mg q4h PRN - wound care consulted, appreciate there care  -PT/OT - recommended SNF - continue probiotic  Paroxysmal atrial fibrillation: Chronic, stable non-RVR EKG on admission with a-flutter/fib. HR stable in 60's overnight. Remains assymptomatic. CHA2DS2-VASc = 3, however has continued to refuse anticoagulation.  - continue Metoprolol 12.5mg  BID, titrate as needed  Borderline personality disorder  MDD Strong history of being verbally abusive and aggressive. Started on Cymbalta during last admission but self-discontinued on discharge. Restarted this admission and endorses improvement with Cymbalta now. - Continue Cymbalta 40mg  QD  - Psych consulted, appreciate recs  CKD, stage IIIa: stable Cr 0.94 this AM. Baseline around 1.0-1.2. Cr stable. -Monitor BMP  Diabetes mellitus, type II: Recent diagnosis, stable. A1C 6.6 in 04/2019. CBGs 120-140's overnight.   No current home meds.  -Monitor CBGs with meals and nightly -Sensitive SSI -Can consider starting metformin as an outpatient  Normocytic anemia: Chronic, decreasing but still above transfusion threshold. Hgb 8.6 this AM. Baseline appears to be around 9-11. Most recent labs notable for iron deficiency. Did not continue iron supplement at prior discharge.  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 Gabapentin increased to 600mg  TID. She is tolerating this well. Tolerating Cymbalta.  - continue Gabapentin 200mg  TID - Cymbalta 40mg  QD  Constipation: Chronic, stable. - continue Miralax QD   FEN/GI: Carb modified PPx: Lovenox  Disposition: Pending medical work-up  Subjective:  Patient resting comfortably in bed. Denies having Unna boots  placed, however when discussing with nurse this was confirmed that Unna boots were in fact placed. Denies any  chest pain/SOB.    Objective: Temp:  [99.3 F (37.4 C)] 99.3 F (37.4 C) (10/25 2115) Pulse Rate:  [63-77] 63 (10/26 0828) Resp:  [16] 16 (10/25 2115) BP: (93-110)/(48-71) 93/48 (10/26 0828) SpO2:  [100 %] 100 % (10/25 2115)  Physical Exam: General: resting comfortably in bed, in no acute distress with non-toxic appearance CV: regular rate and rhythm without murmurs, rubs, or gallops, no lower extremity edema Lungs: clear to auscultation bilaterally with normal work of breathing Abdomen: soft, non-tender, non-distended, normoactive bowel sounds Skin: warm, dry, Unna boots on LE bilaterally Extremities: warm and well perfused, denies any pain with palpation of LE's bilaterally  Laboratory: Recent Labs  Lab 06/19/19 0636 06/21/19 0418 06/22/19 0855  WBC 5.3 3.9* 6.0  HGB 8.7* 8.4* 9.3*  HCT 28.4* 27.5* 30.2*  PLT 281 283 325   Recent Labs  Lab 06/18/19 0802 06/19/19 0636 06/21/19 0418 06/22/19 0855  NA 137 138 137 138  K 3.2* 3.0* 4.4 4.0  CL 99 106 103 101  CO2 25 23 26 28   BUN 22 16 21 16   CREATININE 1.18* 1.01* 0.72 0.92  CALCIUM 8.8* 8.2* 8.3* 8.6*  PROT 7.4 6.0*  --   --   BILITOT 1.2 0.8  --   --   ALKPHOS 71 52  --   --   ALT 21 17  --   --   AST 32 25  --   --   GLUCOSE 167* 125* 124* 148*      Imaging/Diagnostic Tests: No results found.  [place], DO 06/22/2019, 8:35 PM PGY-2, [place] [facility] FPTS Intern pager: [phone], text pages welcome

## 2019-06-22 NOTE — NC FL2 (Signed)
[place] MEDICAID FL2 LEVEL OF CARE SCREENING TOOL     IDENTIFICATION  Patient Name: [name] Birthdate: [birth year] Sex: female Admission Date (Current Location): 06/18/2019  [place] and [place] Number:  Haynes Bast [id number] R Facility and Address:  [facility], [address] [place], [place] [address]      Provider Number: [id number]  Attending Physician Name and Address:  Moses Manners, MD  Relative Name and Phone Number:       Current Level of Care: Hospital Recommended Level of Care:   Prior Approval Number:    Date Approved/Denied:   PASRR Number: [id number] F END DATE 07/13/2019  Discharge Plan: SNF    Current Diagnoses: Patient Active Problem List   Diagnosis Date Noted  . 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 Pressure injury of skin 06/19/2019  . Cellulitis of left lower extremity   . Elevated C-reactive protein (CRP)   . Cellulitis of right lower extremity 06/18/2019  . Leg ulcer (HCC)   . Adjustment disorder with mixed disturbance of emotions and conduct   . Type 2 diabetes mellitus with hyperglycemia (HCC) 05/04/2019  . AKI (acute kidney injury) (HCC)   . Infected ulcer of skin (HCC)   . High risk social situation   . Post-polio syndrome   . Chronic pain syndrome   . Limited mobility 04/20/2019  . Venous stasis ulcer (HCC) 03/31/2019  . Overgrown toenails 10/28/2018  . Wound of right leg 02/06/2018  . Atrial fibrillation (HCC) 01/03/2018  . Anemia 01/03/2018  . Elevated serum creatinine 01/03/2018  . Bipolar I disorder with mania (HCC) 01/03/2018    Orientation RESPIRATION BLADDER Height & Weight     Self, Time, Situation, Place  Normal Incontinent Weight: 164 lb 3.9 oz (74.5 kg) Height:  5\' 6"  (167.6 cm)  BEHAVIORAL SYMPTOMS/MOOD NEUROLOGICAL BOWEL NUTRITION STATUS      Incontinent Diet(carb modified)  AMBULATORY STATUS COMMUNICATION OF NEEDS Skin   Extensive Assist Verbally PU Stage and  Appropriate Care, Skin abrasions(open wound on right and left, lateral, Pretibial. wound on buttocks)   PU Stage 2 Dressing: (located on rectum)                   Personal Care Assistance Level of Assistance  Bathing, Feeding, Dressing Bathing Assistance: Maximum assistance Feeding assistance: Independent Dressing Assistance: Maximum assistance     Functional Limitations Info  Sight, Speech, Hearing Sight Info: Adequate Hearing Info: Adequate Speech Info: Adequate    SPECIAL CARE FACTORS FREQUENCY  PT (By licensed PT), OT (By licensed OT)     PT Frequency: 5x OT Frequency: 5x            Contractures Contractures Info: Not present    Additional Factors Info  Code Status, Allergies Code Status Info: Full Code Allergies Info: Codeine, Tylenol (Acetaminophen)           Current Medications (06/22/2019):  This is the current hospital active medication list Current Facility-Administered Medications  Medication Dose Route Frequency Provider Last Rate Last Dose  . alum & mag hydroxide-simeth (MAALOX/MYLANTA) 200-200-20 MG/5ML suspension 30 mL  30 mL Oral Q6H PRN 03-05-2001, [facility]      . ciprofloxacin (CIPRO) tablet 500 mg  500 mg Oral BID [facility], MD   500 mg at 06/22/19 0827  . [START ON 06/23/2019] DULoxetine (CYMBALTA) DR capsule 40 mg  40 mg Oral Daily Rumball, Alison, DO      . enoxaparin (LOVENOX) injection 40 mg  40 mg Subcutaneous Q24H Beard, Samantha N, DO   40 mg at 06/22/19 1404  . gabapentin (NEURONTIN) capsule 600 mg  600 mg Oral TID Rory Percy, DO   600 mg at 06/22/19 0827  . Gerhardt's butt cream   Topical BID Carollee Leitz, MD      . insulin aspart (novoLOG) injection 0-9 Units  0-9 Units Subcutaneous TID WC Darrelyn Hillock N, DO   1 Units at 06/21/19 1628  . lactobacillus acidophilus (BACID) tablet 2 tablet  2 tablet Oral TID Rory Percy, DO   2 tablet at 06/22/19 5993  . metoprolol tartrate (LOPRESSOR) tablet 12.5 mg  12.5 mg Oral BID  Darrelyn Hillock N, DO   12.5 mg at 06/21/19 2338  . metroNIDAZOLE (FLAGYL) tablet 500 mg  500 mg Oral Q8H Matilde Haymaker, MD   500 mg at 06/22/19 1405  . morphine 2 MG/ML injection 1 mg  1 mg Intravenous Q6H PRN Rory Percy, DO   1 mg at 06/21/19 0700  . multivitamin with minerals tablet 1 tablet  1 tablet Oral Daily Zenia Resides, MD   1 tablet at 06/22/19 5701  . nutrition supplement (JUVEN) (JUVEN) powder packet 1 packet  1 packet Oral BID BM Zenia Resides, MD   1 packet at 06/22/19 1405  . ondansetron (ZOFRAN) tablet 4 mg  4 mg Oral Q6H PRN Darrelyn Hillock N, DO       Or  . ondansetron (ZOFRAN) injection 4 mg  4 mg Intravenous Q6H PRN Higinio Plan, Samantha N, DO      . polyethylene glycol (MIRALAX / GLYCOLAX) packet 17 g  17 g Oral Daily Beard, Samantha N, DO   17 g at 06/20/19 0956  . sodium hypochlorite (DAKIN'S 1/4 STRENGTH) topical solution   Irrigation Daily Hensel, Jamal Collin, MD         Discharge Medications: Please see discharge summary for a list of discharge medications.  Relevant Imaging Results:  Relevant Lab Results:   Additional Information [id number]  Gerrianne Scale Fatim Vanderschaaf, LCSW

## 2019-06-23 ENCOUNTER — Encounter (HOSPITAL_COMMUNITY): Payer: Medicare Other

## 2019-06-23 DIAGNOSIS — F330 Major depressive disorder, recurrent, mild: Secondary | ICD-10-CM | POA: Diagnosis not present

## 2019-06-23 DIAGNOSIS — L03115 Cellulitis of right lower limb: Secondary | ICD-10-CM | POA: Diagnosis not present

## 2019-06-23 DIAGNOSIS — L03116 Cellulitis of left lower limb: Secondary | ICD-10-CM | POA: Diagnosis not present

## 2019-06-23 DIAGNOSIS — R102 Pelvic and perineal pain: Secondary | ICD-10-CM | POA: Diagnosis not present

## 2019-06-23 DIAGNOSIS — I83009 Varicose veins of unspecified lower extremity with ulcer of unspecified site: Secondary | ICD-10-CM | POA: Diagnosis not present

## 2019-06-23 LAB — GLUCOSE, CAPILLARY
Glucose-Capillary: 125 mg/dL — ABNORMAL HIGH (ref 70–99)
Glucose-Capillary: 101 mg/dL — ABNORMAL HIGH (ref 70–99)
Glucose-Capillary: 128 mg/dL — ABNORMAL HIGH (ref 70–99)

## 2019-06-23 LAB — CBC
HCT: 28.8 % — ABNORMAL LOW (ref 36.0–46.0)
Hemoglobin: 8.6 g/dL — ABNORMAL LOW (ref 12.0–15.0)
MCH: 27.3 pg (ref 26.0–34.0)
MCHC: 29.9 g/dL — ABNORMAL LOW (ref 30.0–36.0)
MCV: 91.4 fL (ref 80.0–100.0)
Platelets: 303 10*3/uL (ref 150–400)
RBC: 3.15 MIL/uL — ABNORMAL LOW (ref 3.87–5.11)
RDW: 16.5 % — ABNORMAL HIGH (ref 11.5–15.5)
WBC: 5.2 10*3/uL (ref 4.0–10.5)
nRBC: 0 % (ref 0.0–0.2)

## 2019-06-23 LAB — CULTURE, BLOOD (ROUTINE X 2)
Culture: NO GROWTH
Culture: NO GROWTH
Special Requests: ADEQUATE
Special Requests: ADEQUATE

## 2019-06-23 LAB — BASIC METABOLIC PANEL
Anion gap: 7 (ref 5–15)
BUN: 22 mg/dL (ref 8–23)
CO2: 29 mmol/L (ref 22–32)
Calcium: 8.6 mg/dL — ABNORMAL LOW (ref 8.9–10.3)
Chloride: 101 mmol/L (ref 98–111)
Creatinine, Ser: 0.94 mg/dL (ref 0.44–1.00)
GFR calc Af Amer: >60 mL/min (ref >60)
GFR calc non Af Amer: >60 mL/min (ref >60)
Glucose, Bld: 137 mg/dL — ABNORMAL HIGH (ref 70–99)
Potassium: 4.3 mmol/L (ref 3.5–5.1)
Sodium: 137 mmol/L (ref 135–145)

## 2019-06-23 MED ORDER — ACETAMINOPHEN 325 MG PO TABS: 650.0000 mg | ORAL_TABLET | ORAL | Status: DC | PRN

## 2019-06-23 MED ORDER — HYDROXYZINE HCL 25 MG PO TABS: 25.0000 mg | ORAL_TABLET | Freq: Three times a day (TID) | ORAL | Status: DC | PRN

## 2019-06-23 MED ORDER — TRAMADOL HCL 50 MG PO TABS
50.0000 mg | ORAL_TABLET | Freq: Four times a day (QID) | ORAL | Status: DC | PRN
  Administered 2019-06-23 – 2019-06-28 (×7): 50 mg via ORAL
  Filled 2019-06-23 (×8): qty 1

## 2019-06-23 NOTE — TOC Initial Note (Signed)
Transition of Care [facility]) - Initial/Assessment Note    Patient Details  Name: [name] MRN: [id number] Date of Birth: 03/24/1951  [facility]) CM/SW Contact:    Maree Krabbe, LCSW Phone Number: 06/23/2019, 11:49 AM  Clinical Narrative:  CSW spoke with pt at bedside. Pt states she just lost her apartment. CSW began to talk about d/c plan in terms of SNF and pt states "i'm not deciding anything, I'm taking it day by day." CSW explained in order for pt to be able to go to the snf she wants, if she wants to the process would have to be started. Pt only continued to talk about her apartment. Pt states "no one in here is going to tell me what to do. I am 68 it is time for me to tell y'all what you can do for me."  Pt made very inappropriate comments to CSW, such as "I know about girls like you, you have no one to talk to, you think you are better than anyone, you just need someone to come and burst your bubble." Pt continued being rude and inappropriate. Pt states "and you will go look at apartments for me." CSW continued to reiterate CSW would assist in d/c planning. Pt also states "you need to stay in touch with me on my cell phone so you can find me apartment.  CSW will continue to follow.            Expected Discharge Plan: Skilled Nursing Facility Barriers to Discharge: Continued Medical Work up   Patient Goals and CMS Choice Patient states their goals for this hospitalization and ongoing recovery are:: " to get a apartment"      Expected Discharge Plan and Services Expected Discharge Plan: Skilled Nursing Facility In-house Referral: Clinical Social Work Discharge Planning Services: NA Post Acute Care Choice: NA(undetermined at this time) Living arrangements for the past 2 months: Apartment                                      Prior Living Arrangements/Services Living arrangements for the past 2 months: Apartment Lives with:: Self Patient language and need  for interpreter reviewed:: Yes Do you feel safe going back to the place where you live?: (pt has lost apartment)      Need for Family Participation in Patient Care: No (Comment) Care giver support system in place?: No (comment)   Criminal Activity/Legal Involvement Pertinent to Current Situation/Hospitalization: No - Comment as needed  Activities of Daily Living Home Assistive Devices/Equipment: Walker (specify type) ADL Screening (condition at time of admission) Patient's cognitive ability adequate to safely complete daily activities?: Yes Is the patient deaf or have difficulty hearing?: No Does the patient have difficulty seeing, even when wearing glasses/contacts?: No Does the patient have difficulty concentrating, remembering, or making decisions?: No Patient able to express need for assistance with ADLs?: Yes Does the patient have difficulty dressing or bathing?: Yes Independently performs ADLs?: Yes (appropriate for developmental age) Does the patient have difficulty walking or climbing stairs?: Yes Weakness of Legs: Both Weakness of Arms/Hands: None  Permission Sought/Granted   Permission granted to share information with : No              Emotional Assessment Appearance:: Appears stated age Attitude/Demeanor/Rapport: Aggressive (Verbally and/or physically), Hostile, Self-Absorbed Affect (typically observed): Explosive, Inappropriate Orientation: : Oriented to Self, Oriented to Place, Oriented  to  Time, Oriented to Situation Alcohol / Substance Use: Not Applicable Psych Involvement: No (comment)  Admission diagnosis:  Elevated C-reactive protein (CRP) [R79.82] Leg ulcer (HCC) [L97.909] Cellulitis of left lower extremity [L03.116] Cellulitis of right lower extremity [L03.115] Venous stasis ulcer, unspecified site, unspecified ulcer stage, unspecified whether varicose veins present ([place]) [I83.009, L97.909] Patient Active Problem List   Diagnosis Date Noted  .  Depression, major, recurrent, mild ([place]) 06/21/2019  . Pressure injury of skin 06/19/2019  . Cellulitis of left lower extremity   . Elevated C-reactive protein (CRP)   . Cellulitis of right lower extremity 06/18/2019  . Leg ulcer ([place])   . Adjustment disorder with mixed disturbance of emotions and conduct   . Type 2 diabetes mellitus with hyperglycemia ([place]) 05/04/2019  . AKI (acute kidney injury) ([place])   . Infected ulcer of skin ([place])   . High risk social situation   . Post-polio syndrome   . Chronic pain syndrome   . Limited mobility 04/20/2019  . Venous stasis ulcer ([place]) 03/31/2019  . Overgrown toenails 10/28/2018  . Wound of right leg 02/06/2018  . Atrial fibrillation ([place]) 01/03/2018  . Anemia 01/03/2018  . Elevated serum creatinine 01/03/2018  . Bipolar I disorder with mania ([place]) 01/03/2018   PCP:  Cleophas Dunker, DO Pharmacy:   [place], [place] - [address] [place] [address] Phone: [phone] Fax: [place], [place] [address] Phone: [phone] Fax: [phone]     Social Determinants of Health (SDOH) Interventions    Readmission Risk Interventions No flowsheet data found.

## 2019-06-23 NOTE — TOC Progression Note (Signed)
Transition of Care [facility]) - Progression Note    Patient Details  Name: [name] MRN: [id number] Date of Birth: 11-15-1950  Transition of [facility]) CM/SW Contact  Eileen Stanford, LCSW Phone Number: 06/23/2019, 3:38 PM  Clinical Narrative:   Per PT has agreed to go to [facility] during their session today. CSW will send referral.    Expected Discharge Plan: Skilled Nursing Facility Barriers to Discharge: Continued Medical Work up  Expected Discharge Plan and Services Expected Discharge Plan: [place] In-house Referral: Clinical Social Work Discharge Planning Services: NA Post Acute Care Choice: NA(undetermined at this time) Living arrangements for the past 2 months: Apartment                                       Social Determinants of Health (SDOH) Interventions    Readmission Risk Interventions No flowsheet data found.

## 2019-06-23 NOTE — Progress Notes (Signed)
Physical Therapy Treatment Patient Details Name: [name] MRN: [id number] DOB: 05-Dec-1950 Today's Date: 06/23/2019    History of Present Illness 68 yo female with onset of significant wounds and ulcerations of LE's along with cellulitis was admitted for management of her infection process.  Pt has been home with inadequate care but has been refusing a rehab setting.  PMHx:  L humeral fracture, venous insufficiency, PAF, bipolar and borderline personality disorder, CKD3, DM, polio, c-diff, living with bedbugs and other pests    PT Comments    Patient with some limited participation with EOB activity after much encouragement, multiple choices and discussion about how she feels her manual wheelchair can be converted into a power wheelchair, then said she wanted to get an electric scooter.  Patient remains most appropriate for SNF level rehab and today stating she would prefer [facility].  She does better when given choices and feels she is in control of situation.  Patient still with significant R LE pain limiting activity tolerance.  Will continue skilled PT during acute stay.   Follow Up Recommendations  SNF     Equipment Recommendations  None recommended by PT    Recommendations for Other Services       Precautions / Restrictions Precautions Precautions: Fall Precaution Comments: pt has painful and ulcerated lower legs    Mobility  Bed Mobility Overal bed mobility: Needs Assistance Bed Mobility: Sidelying to Sit;Supine to Sit;Sit to Supine     Supine to sit: Min assist;HOB elevated Sit to supine: Supervision   General bed mobility comments: increased time coming up to sit EOB; assist to lift trunk and pt not wanting me to put foot of the bed down so she can sit up to level surface. to supine lifted her own legs onto bed, assist to scoot up in bed with bilateral rails  Transfers                    Ambulation/Gait                 Stairs              Wheelchair Mobility    Modified Rankin (Stroke Patients Only)       Balance Overall balance assessment: History of Falls;Needs assistance   Sitting balance-Leahy Scale: Poor Sitting balance - Comments: sitting up EOB needing UE support due to foot of bed up and surface uneven; sat about 5-6 minutes discussing plans and pains and difficulties                                    Cognition Arousal/Alertness: Awake/alert Behavior During Therapy: Anxious Overall Cognitive Status: History of cognitive impairments - at baseline                                 General Comments: poor insight into poor living status and inability to care for self      Exercises      General Comments General comments (skin integrity, edema, etc.): BP sitting 107/71 mild c/o dizziness      Pertinent Vitals/Pain Faces Pain Scale: Hurts little more Pain Location: BLEs, R>L Pain Descriptors / Indicators: Grimacing;Aching Pain Intervention(s): Monitored during session;Repositioned;Limited activity within patient's tolerance    Home Living  Prior Function            PT Goals (current goals can now be found in the care plan section) Progress towards PT goals: Progressing toward goals    Frequency    Min 2X/week      PT Plan Current plan remains appropriate;Frequency needs to be updated    Co-evaluation              AM-PAC PT "6 Clicks" Mobility   Outcome Measure  Help needed turning from your back to your side while in a flat bed without using bedrails?: A Little Help needed moving from lying on your back to sitting on the side of a flat bed without using bedrails?: A Little Help needed moving to and from a bed to a chair (including a wheelchair)?: Total Help needed standing up from a chair using your arms (e.g., wheelchair or bedside chair)?: Total Help needed to walk in hospital room?: Total Help needed climbing 3-5  steps with a railing? : Total 6 Click Score: 10    End of Session   Activity Tolerance: Patient limited by pain Patient left: in bed;with call bell/phone within reach   PT Visit Diagnosis: Other abnormalities of gait and mobility (R26.89);Pain;Muscle weakness (generalized) (M62.81) Pain - Right/Left: Right Pain - part of body: Leg     Time: 1428-1450 PT Time Calculation (min) (ACUTE ONLY): 22 min  Charges:  $Therapeutic Activity: 8-22 mins                     [name], [place] Acute Rehabilitation Services [phone] 06/23/2019    [name] 06/23/2019, 3:47 PM

## 2019-06-24 DIAGNOSIS — L97909 Non-pressure chronic ulcer of unspecified part of unspecified lower leg with unspecified severity: Secondary | ICD-10-CM | POA: Diagnosis not present

## 2019-06-24 DIAGNOSIS — R102 Pelvic and perineal pain: Secondary | ICD-10-CM | POA: Diagnosis not present

## 2019-06-24 DIAGNOSIS — L03116 Cellulitis of left lower limb: Secondary | ICD-10-CM | POA: Diagnosis not present

## 2019-06-24 DIAGNOSIS — F330 Major depressive disorder, recurrent, mild: Secondary | ICD-10-CM | POA: Diagnosis not present

## 2019-06-24 DIAGNOSIS — L03115 Cellulitis of right lower limb: Secondary | ICD-10-CM | POA: Diagnosis not present

## 2019-06-24 LAB — GLUCOSE, CAPILLARY
Glucose-Capillary: 98 mg/dL (ref 70–99)
Glucose-Capillary: 115 mg/dL — ABNORMAL HIGH (ref 70–99)
Glucose-Capillary: 130 mg/dL — ABNORMAL HIGH (ref 70–99)
Glucose-Capillary: 135 mg/dL — ABNORMAL HIGH (ref 70–99)

## 2019-06-24 LAB — BASIC METABOLIC PANEL
Anion gap: 7 (ref 5–15)
BUN: 23 mg/dL (ref 8–23)
CO2: 30 mmol/L (ref 22–32)
Calcium: 8.4 mg/dL — ABNORMAL LOW (ref 8.9–10.3)
Chloride: 100 mmol/L (ref 98–111)
Creatinine, Ser: 1.03 mg/dL — ABNORMAL HIGH (ref 0.44–1.00)
GFR calc Af Amer: >60 mL/min (ref >60)
GFR calc non Af Amer: 56 mL/min — ABNORMAL LOW (ref >60)
Glucose, Bld: 113 mg/dL — ABNORMAL HIGH (ref 70–99)
Potassium: 4.3 mmol/L (ref 3.5–5.1)
Sodium: 137 mmol/L (ref 135–145)

## 2019-06-24 LAB — CBC
HCT: 28.4 % — ABNORMAL LOW (ref 36.0–46.0)
Hemoglobin: 8.6 g/dL — ABNORMAL LOW (ref 12.0–15.0)
MCH: 28 pg (ref 26.0–34.0)
MCHC: 30.3 g/dL (ref 30.0–36.0)
MCV: 92.5 fL (ref 80.0–100.0)
Platelets: 298 10*3/uL (ref 150–400)
RBC: 3.07 MIL/uL — ABNORMAL LOW (ref 3.87–5.11)
RDW: 16.9 % — ABNORMAL HIGH (ref 11.5–15.5)
WBC: 5.1 10*3/uL (ref 4.0–10.5)
nRBC: 0 % (ref 0.0–0.2)

## 2019-06-24 NOTE — Progress Notes (Signed)
Family Medicine Teaching Service Daily Progress Note Intern Pager: [phone]  Patient name: [name] Medical record number: [id number] Date of birth: [birth year] Age: 68 y.o. Gender: female  Primary Care Provider: Cleophas Dunker, DO Consultants: Vascular surgery, Psych Code Status: Full  Pt Overview and Major Events to Date:  10/22 - admitted to [facility], for worsening of chronic LE wounds and concern for cellulitis  10/23 - MRI R tibia/fibula negative for osteo, VVS consulted - not arterial   Antibiotics Course: Vanc (10/22-10/25) Zosyn (10/22) Cefepime (10/22-10/25) Cipro (10/25-10/31) Flagyl (10/22-10/31)  Assessment and Plan: [name] is a 68 y.o. female presenting with worsening bilateral lower extremity erythema in the setting of chronic insufficiency with stasis ulcer, suspicious for cellulitis. PMH is significant for chronic venous insufficiency with stasis ulcers, atrial fibrillation, borderline personality disorder, diabetes, normocytic anemia, chronic pain.  Social Concerns: Homelessness Prior to admission, patient lived alone in [place]. Patient recently evicted from apartment due to hygiene issue. Has history of being discharged from numerous home health agencies and refusing SNF. CSW consulted, attempting d/c to [place]. - continue to follow up CSW for dispo  Bilateral LE cellulitis, in setting of chronic venous insufficiency with stasis ulcers:  Remains stable on Cipro and flagyl, last day 10/31. Continues without leukocytosis and with stable BP. Blood culture negative to date. S/p Unna boot placement. Only with one dose of tramadol required overnight.  - VVS consulted, not arterial, likely distribution of venous insufficiency  - If noncompliant with unna boot, alternative with Vive wear silver and copper impregnated compression stockings - Continue Cipro 100mg  BID (10/25-10/31) and Flagyl 500mg  q8h (10/22-10/31)   - H/o c. Diff during last  admission  -Arterial Dopplers bilaterally, refused - wound care consulted, appreciate care  -PT/OT - recommended SNF - continue probiotic  Paroxysmal atrial fibrillation: Chronic, stable non-RVR EKG on admission with a-flutter/fib, continues to be rate controlled. Remains assymptomatic. CHA2DS2-VASc = 3, however has continued to refuse anticoagulation.  - continue Metoprolol 12.5mg  BID, titrate as needed  Borderline personality disorder  MDD Strong history of being verbally abusive and aggressive. Started on Cymbalta during last admission but self-discontinued on discharge. Restarted this admission and endorses improvement with Cymbalta now. - Continue Cymbalta 40mg  QD  - Psych consulted, appreciate recs  CKD, stage IIIa: stable Cr at baseline, 1.03 this AM. Baseline around 1.0-1.2. Cr stable. -Monitor BMP  Diabetes mellitus, type II: Recent diagnosis, stable. A1C 6.6 in 04/2019. CBGs 120s overnight.   -Monitor CBGs with meals and nightly - d/c Sensitive SSI -restart metformin   Normocytic anemia: Chronic, decreasing but still above transfusion threshold. Hgb stable, 8.6 this AM. Baseline appears to be around 9-11. Most recent labs notable for iron deficiency. Did not continue iron supplement at prior discharge.  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 Gabapentin increased to 600mg  TID this admission. She is tolerating this well. Tolerating Cymbalta.  - continue Gabapentin 200mg  TID - Cymbalta 40mg  QD  Constipation: Chronic, stable. - continue Miralax QD   FEN/GI: Carb modified PPx: Lovenox  Disposition: stable for d/c to SNF when available  Subjective:  Patient irritable this am, refusing exam.   Objective: Temp:  [98 F (36.7 C)-98.3 F (36.8 C)] 98 F (36.7 C) (10/28 0521) Pulse Rate:  [67-74] 67 (10/28 0521) BP:  (92-109)/(53-58) 98/58 (10/28 0521) SpO2:  [96 %-98 %] 96 % (  10/28 0521)  Physical Exam: General: elderly female lying in bed, irritable Extremities: bilateral LE wrapped in unna boots, no progression of erythema beyond boot borders.  Laboratory: Recent Labs  Lab 06/22/19 0855 06/23/19 0358 06/24/19 0513  WBC 6.0 5.2 5.1  HGB 9.3* 8.6* 8.6*  HCT 30.2* 28.8* 28.4*  PLT 325 303 298   Recent Labs  Lab 06/18/19 0802 06/19/19 0636  06/22/19 0855 06/23/19 0358 06/24/19 0513  NA 137 138   < > 138 137 137  K 3.2* 3.0*   < > 4.0 4.3 4.3  CL 99 106   < > 101 101 100  CO2 25 23   < > 28 29 30   BUN 22 16   < > 16 22 23   CREATININE 1.18* 1.01*   < > 0.92 0.94 1.03*  CALCIUM 8.8* 8.2*   < > 8.6* 8.6* 8.4*  PROT 7.4 6.0*  --   --   --   --   BILITOT 1.2 0.8  --   --   --   --   ALKPHOS 71 52  --   --   --   --   ALT 21 17  --   --   --   --   AST 32 25  --   --   --   --   GLUCOSE 167* 125*   < > 148* 137* 113*   < > = values in this interval not displayed.    Imaging/Diagnostic Tests: No results found.  , DO 06/24/2019, 8:05 AM PGY-3, [place] [facility] FPTS Intern pager: [phone], text pages welcome

## 2019-06-24 NOTE — TOC Progression Note (Addendum)
Transition of Care [facility]) - Progression Note    Patient Details  Name: [name] MRN: [id number] Date of Birth: [birth year]  Transition of Care [facility]) CM/SW [place], LCSW Phone Number: 06/24/2019, 4:08 PM  Clinical Narrative:   Pt has been faxed out further to [facility] facilities this morning and no bed offers at this time. CSW anticipates placement will be difficult because pt has no d/c plan following SNF.     Expected Discharge Plan: Skilled Nursing Facility Barriers to Discharge: Continued Medical Work up  Expected Discharge Plan and Services Expected Discharge Plan: [place] In-house Referral: Clinical Social Work Discharge Planning Services: NA Post Acute Care Choice: NA(undetermined at this time) Living arrangements for the past 2 months: Apartment                                       Social Determinants of Health (SDOH) Interventions    Readmission Risk Interventions No flowsheet data found.

## 2019-06-24 NOTE — TOC Progression Note (Signed)
Transition of Care [facility]) - Progression Note    Patient Details  Name: DONIQUE HAMMONDS MRN: [id number] Date of Birth: 1951-03-21  Transition of Care [facility]) CM/SW [place], LCSW Phone Number: 06/24/2019, 10:02 AM  Clinical Narrative:   The only SNF pt wanted to go to has declined pt. RN is going to talk to pt about other options.     Expected Discharge Plan: Skilled Nursing Facility Barriers to Discharge: Continued Medical Work up  Expected Discharge Plan and Services Expected Discharge Plan: [place] In-house Referral: Clinical Social Work Discharge Planning Services: NA Post Acute Care Choice: NA(undetermined at this time) Living arrangements for the past 2 months: Apartment                                       Social Determinants of Health (SDOH) Interventions    Readmission Risk Interventions No flowsheet data found.

## 2019-06-25 DIAGNOSIS — F330 Major depressive disorder, recurrent, mild: Secondary | ICD-10-CM | POA: Diagnosis not present

## 2019-06-25 DIAGNOSIS — L03115 Cellulitis of right lower limb: Secondary | ICD-10-CM | POA: Diagnosis not present

## 2019-06-25 DIAGNOSIS — L97909 Non-pressure chronic ulcer of unspecified part of unspecified lower leg with unspecified severity: Secondary | ICD-10-CM | POA: Diagnosis not present

## 2019-06-25 DIAGNOSIS — L03116 Cellulitis of left lower limb: Secondary | ICD-10-CM | POA: Diagnosis not present

## 2019-06-25 DIAGNOSIS — R102 Pelvic and perineal pain: Secondary | ICD-10-CM | POA: Diagnosis not present

## 2019-06-25 LAB — CBC
HCT: 30.5 % — ABNORMAL LOW (ref 36.0–46.0)
Hemoglobin: 9.1 g/dL — ABNORMAL LOW (ref 12.0–15.0)
MCH: 27.8 pg (ref 26.0–34.0)
MCHC: 29.8 g/dL — ABNORMAL LOW (ref 30.0–36.0)
MCV: 93.3 fL (ref 80.0–100.0)
Platelets: 328 10*3/uL (ref 150–400)
RBC: 3.27 MIL/uL — ABNORMAL LOW (ref 3.87–5.11)
RDW: 17 % — ABNORMAL HIGH (ref 11.5–15.5)
WBC: 5.7 10*3/uL (ref 4.0–10.5)
nRBC: 0 % (ref 0.0–0.2)

## 2019-06-25 LAB — BASIC METABOLIC PANEL
Anion gap: 8 (ref 5–15)
BUN: 28 mg/dL — ABNORMAL HIGH (ref 8–23)
CO2: 28 mmol/L (ref 22–32)
Calcium: 8.5 mg/dL — ABNORMAL LOW (ref 8.9–10.3)
Chloride: 101 mmol/L (ref 98–111)
Creatinine, Ser: 0.96 mg/dL (ref 0.44–1.00)
GFR calc Af Amer: >60 mL/min (ref >60)
GFR calc non Af Amer: >60 mL/min (ref >60)
Glucose, Bld: 89 mg/dL (ref 70–99)
Potassium: 4.2 mmol/L (ref 3.5–5.1)
Sodium: 137 mmol/L (ref 135–145)

## 2019-06-25 LAB — GLUCOSE, CAPILLARY
Glucose-Capillary: 95 mg/dL (ref 70–99)
Glucose-Capillary: 107 mg/dL — ABNORMAL HIGH (ref 70–99)
Glucose-Capillary: 112 mg/dL — ABNORMAL HIGH (ref 70–99)
Glucose-Capillary: 121 mg/dL — ABNORMAL HIGH (ref 70–99)

## 2019-06-25 NOTE — Progress Notes (Signed)
Family Medicine Teaching Service Daily Progress Note Intern Pager: [phone]  Patient name: [name] Medical record number: [id number] Date of birth: Oct 21, 1950 Age: 68 y.o. Gender: female  Primary Care Provider: [name], DO Consultants: Vascular surgery, Psych Code Status: Full  Pt Overview and Major Events to Date:  10/22 - admitted to [facility], for worsening of chronic LE wounds and concern for cellulitis  10/23 - MRI R tibia/fibula negative for osteo, VVS consulted - not arterial   Antibiotics Course: Vanc (10/22-10/25) Zosyn (10/22) Cefepime (10/22-10/25) Cipro (10/25-10/31) Flagyl (10/22-10/31)  Assessment and Plan: [name] is a 68 y.o. female presenting with worsening bilateral lower extremity erythema in the setting of chronic insufficiency with stasis ulcer, suspicious for cellulitis. PMH is significant for chronic venous insufficiency with stasis ulcers, atrial fibrillation, borderline personality disorder, diabetes, normocytic anemia, chronic pain.  Social Concerns: Homelessness Prior to admission, patient lived alone in [place]. Patient recently evicted from apartment due to hygiene issue. Has history of being discharged from numerous home health agencies and refusing SNF. CSW consulted, attempting d/c to [place] who has since refused patient. - continue to follow up CSW for dispo  Bilateral LE cellulitis, in setting of chronic venous insufficiency with stasis ulcers:  Remains stable on Cipro and flagyl, last day 10/31. Continues without leukocytosis and with stable BP. Blood culture negative to date. S/p Unna boot placement. Only with one dose of tramadol required overnight.  - VVS consulted, not arterial, likely distribution of venous insufficiency  - If noncompliant with unna boot, alternative with Vive wear silver and copper impregnated compression stockings - Continue Cipro 100mg  BID (10/25-10/31) and Flagyl 500mg  q8h (10/22-10/31)   -  H/o c. Diff during last admission  -Arterial Dopplers bilaterally, refused - wound care consulted, appreciate care  -PT/OT - recommended SNF - continue probiotic  Paroxysmal atrial fibrillation: Chronic, stable non-RVR EKG on admission with a-flutter/fib, continues to be rate controlled. Remains assymptomatic. CHA2DS2-VASc = 3, however has continued to refuse anticoagulation.  - continue Metoprolol 12.5mg  BID, titrate as needed  Borderline personality disorder  MDD Strong history of being verbally abusive and aggressive. Started on Cymbalta during last admission but self-discontinued on discharge. Restarted this admission and endorses improvement with Cymbalta now. - Continue Cymbalta 40mg  QD   CKD, stage IIIa: stable Cr at baseline, 1.03 this AM. Baseline around 1.0-1.2. Cr stable. -Monitor BMP  Diabetes mellitus, type II: Recent diagnosis, stable. A1C 6.6 in 04/2019. CBGs 98-130s overnight.   -Monitor CBGs with meals and nightly - d/c Sensitive SSI -restart metformin   Normocytic anemia: Chronic, decreasing but still above transfusion threshold. Hgb stable, 9.1 this AM. Baseline appears to be around 9-11. Most recent labs notable for iron deficiency. Did not continue iron supplement at prior discharge.  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 Gabapentin increased to 600mg  TID this admission. She is tolerating this well. Tolerating Cymbalta.  - continue Gabapentin 200mg  TID - Cymbalta 40mg  QD  Constipation: Chronic, stable. - continue Miralax QD   FEN/GI: Carb modified PPx: Lovenox  Disposition: stable for d/c to SNF when available  Subjective:  Patient states she has a bit of a headache this morning, states she is anxious to find a place to live so she can leave the hospital. Denies other complaints at this time   Objective: Temp:   [98.1 F (36.7 C)-98.5 F (  36.9 C)] 98.5 F (36.9 C) (10/29 0513) Pulse Rate:  [68-71] 68 (10/29 0513) BP: (90-104)/(49-58) 90/49 (10/29 0513) SpO2:  [96 %-99 %] 96 % (10/29 0513) Weight:  [78.9 kg] 78.9 kg (10/29 0514)  Physical Exam: General: Alert and oriented in no apparent distress Heart: Regular rate and rhythm with no murmurs appreciated Lungs: CTA bilaterally, no wheezing Abdomen: Bowel sounds present Skin: Warm and dry Extremities: Unna boots present  Laboratory: Recent Labs  Lab 06/23/19 0358 06/24/19 0513 06/25/19 0411  WBC 5.2 5.1 5.7  HGB 8.6* 8.6* 9.1*  HCT 28.8* 28.4* 30.5*  PLT 303 298 328   Recent Labs  Lab 06/18/19 0802 06/19/19 0636  06/23/19 0358 06/24/19 0513 06/25/19 0411  NA 137 138   < > 137 137 137  K 3.2* 3.0*   < > 4.3 4.3 4.2  CL 99 106   < > 101 100 101  CO2 25 23   < > 29 30 28   BUN 22 16   < > 22 23 28*  CREATININE 1.18* 1.01*   < > 0.94 1.03* 0.96  CALCIUM 8.8* 8.2*   < > 8.6* 8.4* 8.5*  PROT 7.4 6.0*  --   --   --   --   BILITOT 1.2 0.8  --   --   --   --   ALKPHOS 71 52  --   --   --   --   ALT 21 17  --   --   --   --   AST 32 25  --   --   --   --   GLUCOSE 167* 125*   < > 137* 113* 89   < > = values in this interval not displayed.    Imaging/Diagnostic Tests: No results found.  Lurline Del, DO 06/25/2019, 6:08 AM PGY-3, [place] Intern pager: [phone], text pages welcome

## 2019-06-25 NOTE — Progress Notes (Signed)
Physical Therapy Treatment Patient Details Name: [name] MRN: [id number] DOB: [birth year] Today's Date: 06/25/2019    History of Present Illness 68 yo female with onset of significant wounds and ulcerations of LE's along with cellulitis was admitted for management of her infection process.  Pt has been home with inadequate care but has been refusing a rehab setting.  PMHx:  L humeral fracture, venous insufficiency, PAF, bipolar and borderline personality disorder, CKD3, DM, polio, c-diff, living with bedbugs and other pests    PT Comments    Pt progressing slowly with mobility. Able to stand today. Continue to recommend ST-SNF at dc.    Follow Up Recommendations  SNF     Equipment Recommendations  None recommended by PT    Recommendations for Other Services       Precautions / Restrictions Precautions Precautions: Fall Precaution Comments: pt has painful and ulcerated lower legs    Mobility  Bed Mobility Overal bed mobility: Needs Assistance Bed Mobility: Supine to Sit;Sit to Supine     Supine to sit: Min assist;HOB elevated Sit to supine: Supervision   General bed mobility comments: Assist to elevate trunk into sitting. Incr time and effort  Transfers Overall transfer level: Needs assistance Equipment used: 1 person hand held assist Transfers: Sit to/from Stand Sit to Stand: Mod assist         General transfer comment: Assist to bring hips up and for balance. Pt refused use of walker. Used bed rail on rt and my forearm on lt.   Ambulation/Gait                 Stairs             Wheelchair Mobility    Modified Rankin (Stroke Patients Only)       Balance Overall balance assessment: History of Falls;Needs assistance Sitting-balance support: No upper extremity supported;Feet supported Sitting balance-Leahy Scale: Fair Sitting balance - Comments: Sat EOB x 7-8 minutes   Standing balance support: Bilateral upper extremity  supported Standing balance-Leahy Scale: Poor Standing balance comment: Stood x 20 seconds with UE support and min assist                            Cognition Arousal/Alertness: Awake/alert Behavior During Therapy: Anxious Overall Cognitive Status: History of cognitive impairments - at baseline                                 General Comments: Poor insight into medical conditions and inability to care for self      Exercises General Exercises - Lower Extremity Ankle Circles/Pumps: AROM;Both;10 reps;Seated Long Arc Quad: AROM;Both;10 reps;Seated    General Comments        Pertinent Vitals/Pain Pain Assessment: Faces Faces Pain Scale: Hurts little more Pain Location: BLEs, R>L Pain Descriptors / Indicators: Grimacing;Aching Pain Intervention(s): Limited activity within patient's tolerance;Monitored during session;Repositioned    Home Living                      Prior Function            PT Goals (current goals can now be found in the care plan section) Progress towards PT goals: Progressing toward goals    Frequency    Min 2X/week      PT Plan Current plan remains appropriate;Frequency needs to be updated  Co-evaluation              AM-PAC PT "6 Clicks" Mobility   Outcome Measure  Help needed turning from your back to your side while in a flat bed without using bedrails?: A Little Help needed moving from lying on your back to sitting on the side of a flat bed without using bedrails?: A Little Help needed moving to and from a bed to a chair (including a wheelchair)?: Total Help needed standing up from a chair using your arms (e.g., wheelchair or bedside chair)?: A Lot Help needed to walk in hospital room?: Total Help needed climbing 3-5 steps with a railing? : Total 6 Click Score: 11    End of Session   Activity Tolerance: Patient limited by pain Patient left: in bed;with call bell/phone within reach;with bed alarm  set   PT Visit Diagnosis: Other abnormalities of gait and mobility (R26.89);Pain;Muscle weakness (generalized) (M62.81) Pain - Right/Left: Right Pain - part of body: Leg     Time: 1510-1530 PT Time Calculation (min) (ACUTE ONLY): 20 min  Charges:  $Therapeutic Activity: 8-22 mins                     [facility] PT Acute Rehabilitation Services Pager [phone] Office [phone]    Angelina Ok [facility] 06/25/2019, 5:24 PM

## 2019-06-25 NOTE — Progress Notes (Signed)
Nutrition Follow-up  INTERVENTION:   -1 packet Juven BID, each packet provides 95 calories, 2.5 grams of protein (collagen), and 9.8 grams of carbohydrate (3 grams sugar); also contains 7 grams of L-arginine and L-glutamine, 300 mg vitamin C, 15 mg vitamin E, 1.2 mcg vitamin B-12, 9.5 mg zinc, 200 mg calcium, and 1.5 g  Calcium Beta-hydroxy-Beta-methylbutyrate to support wound healing -MVI with minerals daily -Magic cup TID with meals, each supplement provides 290 kcal and 9 grams of protein  NUTRITION DIAGNOSIS:   Increased nutrient needs related to wound healing as evidenced by estimated needs.  Ongoing.  GOAL:   Patient will meet greater than or equal to 90% of their needs  Progressing.  MONITOR:   PO intake, Supplement acceptance, Labs, Weight trends, Skin, I & O's  ASSESSMENT:   [name] is a 68 y.o. female presenting with worsening bilateral lower extremity erythema in the setting of chronic insufficiency with stasis ulcer, suspicious for cellulitis. PMH is significant for chronic venous insufficiency with stasis ulcers, atrial fibrillation, borderline personality disorder, diabetes, normocytic anemia, chronic pain. Pt admitted with bilateral LE cellulitis, in the setting or chronic venous insufficiency with stasis ulcers.  **RD working remotely**  Patient is currently consuming 50-100% of meals. Pt is also drinking supplements for wound healing. Per MD note, pt awaiting SNF placement.  Admission weight: 170 lbs. Current weight: 173 lbs.   I/Os: +4275 ml since admit UOP: 300 ml so far today  Medications: Bacid tablet, Multivitamin with minerals daily,  Labs reviewed.: CBGs: 95-107  Diet Order:   Diet Order            Diet Carb Modified Fluid consistency: Thin; Room service appropriate? Yes  Diet effective now              EDUCATION NEEDS:   No education needs have been identified at this time  Skin:  Skin Assessment: Skin Integrity Issues: Skin  Integrity Issues:: Other (Comment), Stage II Stage II: rectum Other: chronic poorly healing venous stasis ulcers of bilateral lower extremities  Last BM:  10/28- type 6  Height:   Ht Readings from Last 1 Encounters:  06/18/19 5\' 6"  (1.676 m)    Weight:   Wt Readings from Last 1 Encounters:  06/25/19 78.9 kg    Ideal Body Weight:  59.1 kg  BMI:  Body mass index is 28.08 kg/m.  Estimated Nutritional Needs:   Kcal:  7412-8786  Protein:  100-115 grams  Fluid:  > 1.8 L  Clayton Bibles, MS, RD, LDN Inpatient Clinical Dietitian Pager: [phone] After Hours Pager: [phone]

## 2019-06-26 DIAGNOSIS — R102 Pelvic and perineal pain: Secondary | ICD-10-CM | POA: Diagnosis not present

## 2019-06-26 DIAGNOSIS — L03115 Cellulitis of right lower limb: Secondary | ICD-10-CM | POA: Diagnosis not present

## 2019-06-26 DIAGNOSIS — L03116 Cellulitis of left lower limb: Secondary | ICD-10-CM | POA: Diagnosis not present

## 2019-06-26 LAB — CBC
HCT: 29.8 % — ABNORMAL LOW (ref 36.0–46.0)
Hemoglobin: 8.9 g/dL — ABNORMAL LOW (ref 12.0–15.0)
MCH: 27.8 pg (ref 26.0–34.0)
MCHC: 29.9 g/dL — ABNORMAL LOW (ref 30.0–36.0)
MCV: 93.1 fL (ref 80.0–100.0)
Platelets: 295 10*3/uL (ref 150–400)
RBC: 3.2 MIL/uL — ABNORMAL LOW (ref 3.87–5.11)
RDW: 17.2 % — ABNORMAL HIGH (ref 11.5–15.5)
WBC: 5.6 10*3/uL (ref 4.0–10.5)
nRBC: 0 % (ref 0.0–0.2)

## 2019-06-26 LAB — BASIC METABOLIC PANEL
Anion gap: 9 (ref 5–15)
BUN: 30 mg/dL — ABNORMAL HIGH (ref 8–23)
CO2: 29 mmol/L (ref 22–32)
Calcium: 8.7 mg/dL — ABNORMAL LOW (ref 8.9–10.3)
Chloride: 100 mmol/L (ref 98–111)
Creatinine, Ser: 1.1 mg/dL — ABNORMAL HIGH (ref 0.44–1.00)
GFR calc Af Amer: 60 mL/min — ABNORMAL LOW (ref >60)
GFR calc non Af Amer: 52 mL/min — ABNORMAL LOW (ref >60)
Glucose, Bld: 92 mg/dL (ref 70–99)
Potassium: 4.4 mmol/L (ref 3.5–5.1)
Sodium: 138 mmol/L (ref 135–145)

## 2019-06-26 LAB — GLUCOSE, CAPILLARY
Glucose-Capillary: 85 mg/dL (ref 70–99)
Glucose-Capillary: 113 mg/dL — ABNORMAL HIGH (ref 70–99)
Glucose-Capillary: 117 mg/dL — ABNORMAL HIGH (ref 70–99)
Glucose-Capillary: 186 mg/dL — ABNORMAL HIGH (ref 70–99)

## 2019-06-26 MED ORDER — IBUPROFEN 200 MG PO TABS
400.0000 mg | ORAL_TABLET | Freq: Once | ORAL | Status: AC
  Administered 2019-06-26: 400 mg via ORAL
  Filled 2019-06-26: qty 2

## 2019-06-26 NOTE — TOC Progression Note (Signed)
Transition of Care [facility]) - Progression Note    Patient Details  Name: [name] MRN: [id number] Date of Birth: 10/23/1950  Transition of Care [facility]) CM/SW [place], [place] Phone Number: 06/26/2019, 1:30 PM  Clinical Narrative:     Patient has been accepted to [facility]. They will be able to take her on Sunday. CSW informed MD, and COVID will be ordered on Saturday.   CSW will plan on reaching out to [facility] on Sunday morning to coordinate discharge.   CSW will continue to follow.   Expected Discharge Plan: Skilled Nursing Facility Barriers to Discharge: Continued Medical Work up  Expected Discharge Plan and Services Expected Discharge Plan: [place] In-house Referral: Clinical Social Work Discharge Planning Services: NA Post Acute Care Choice: NA(undetermined at this time) Living arrangements for the past 2 months: Apartment                                       Social Determinants of Health (SDOH) Interventions    Readmission Risk Interventions No flowsheet data found.
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
## 2019-06-26 NOTE — Progress Notes (Signed)
Family Medicine Teaching Service Daily Progress Note Intern Pager: [phone]  Patient name: GLENICE CICCONE Medical record number: [id number] Date of birth: 1951-02-25 Age: 68 y.o. Gender: female  Primary Care Provider: Cleophas Dunker, DO Consultants: Vascular surgery, Psych Code Status: Full  Pt Overview and Major Events to Date:  10/22 - admitted to [facility], for worsening of chronic LE wounds and concern for cellulitis  10/23 - MRI R tibia/fibula negative for osteo, VVS consulted - not arterial   Antibiotics Course: Vanc (10/22-10/25) Zosyn (10/22) Cefepime (10/22-10/25) Cipro (10/25-10/31) Flagyl (10/22-10/31)  Assessment and Plan: AALAYSIA LIGGINS is a 68 y.o. female presenting with worsening bilateral lower extremity erythema in the setting of chronic insufficiency with stasis ulcer, suspicious for cellulitis. PMH is significant for chronic venous insufficiency with stasis ulcers, atrial fibrillation, borderline personality disorder, diabetes, normocytic anemia, chronic pain.  Social Concerns: Homelessness Prior to admission, patient lived alone in [place]. Patient recently evicted from apartment due to hygiene issue. Has history of being discharged from numerous home health agencies and refusing SNF. CSW consulted, attempting d/c to [place] who has since refused patient. - continue to follow up CSW for dispo  Bilateral LE cellulitis, in setting of chronic venous insufficiency with stasis ulcers:  Remains stable on Cipro and flagyl, last day 10/31. Continues without leukocytosis and with stable BP. Blood culture negative to date. S/p Unna boot placement. Only with one dose of tramadol required overnight.  -Continue Unna boots, changed weekly. -Patient completed course of Cipro and Flagyl. -Arterial Dopplers bilaterally, refused - wound care consulted, appreciate care  -PT/OT - recommended SNF - continue probiotic  Paroxysmal atrial fibrillation: Chronic, stable  non-RVR EKG on admission with a-flutter/fib, continues to be rate controlled. Remains assymptomatic. CHA2DS2-VASc = 3, however has continued to refuse anticoagulation.  - continue Metoprolol 12.5mg  BID, titrate as needed  Borderline personality disorder  MDD Strong history of being verbally abusive and aggressive. Started on Cymbalta during last admission but self-discontinued on discharge. Restarted this admission and endorses improvement with Cymbalta now. - Continue Cymbalta 40mg  QD   CKD, stage IIIa: stable Cr at baseline, 1.10 this AM. Baseline around 1.0-1.2. Cr stable. -Monitor BMP  Diabetes mellitus, type II: Recent diagnosis, stable. A1C 6.6 in 04/2019. CBGs 107-121 overnight.   -Monitor CBGs with meals and nightly - d/c Sensitive SSI -restart metformin   Normocytic anemia: Chronic, decreasing but still above transfusion threshold. Hgb stable, 8.9 this AM. Baseline appears to be around 9-11. Most recent labs notable for iron deficiency. Did not continue iron supplement at prior discharge.  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 Gabapentin increased to 600mg  TID this admission. She is tolerating this well. Tolerating Cymbalta.  - continue Gabapentin 200mg  TID - Cymbalta 40mg  QD  Constipation: Chronic, stable. - continue Miralax QD   FEN/GI: Carb modified PPx: Lovenox  Disposition: stable for d/c to SNF when available  Subjective:  States her only complaint this morning is some leg pain.  Patient still endorses that she is anxious about finding an answer to her housing situation.   Objective: Temp:  [97.9 F (36.6 C)-98.4 F (36.9 C)] 97.9 F (36.6 C) (10/30 0627) Pulse Rate:  [67-72] 67 (10/30 0627) Resp:  [18] 18 (10/30 0627) BP: (93-121)/(51-88) 101/51 (10/30 0627) SpO2:  [98 %-100 %] 98 % (10/30 0627) Weight:  [78.7 kg]  78.7 kg (10/30 0627)  Physical Exam: General: Alert and oriented in no apparent distress Heart: S1, S2 with no murmurs appreciated Lungs: CTA bilaterally, no wheezing Abdomen: Bowel sounds present, no abdominal pain Extremities: Unna boots present   Laboratory: Recent Labs  Lab 06/24/19 0513 06/25/19 0411 06/26/19 0342  WBC 5.1 5.7 5.6  HGB 8.6* 9.1* 8.9*  HCT 28.4* 30.5* 29.8*  PLT 298 328 295   Recent Labs  Lab 06/19/19 0636  06/24/19 0513 06/25/19 0411 06/26/19 0342  NA 138   < > 137 137 138  K 3.0*   < > 4.3 4.2 4.4  CL 106   < > 100 101 100  CO2 23   < > 30 28 29   BUN 16   < > 23 28* 30*  CREATININE 1.01*   < > 1.03* 0.96 1.10*  CALCIUM 8.2*   < > 8.4* 8.5* 8.7*  PROT 6.0*  --   --   --   --   BILITOT 0.8  --   --   --   --   ALKPHOS 52  --   --   --   --   ALT 17  --   --   --   --   AST 25  --   --   --   --   GLUCOSE 125*   < > 113* 89 92   < > = values in this interval not displayed.    Imaging/Diagnostic Tests: No results found.  , DO 06/26/2019, 6:32 AM PGY-1, [facility] Family Medicine FPTS Intern pager: [phone], text pages welcome

## 2019-06-27 DIAGNOSIS — L03116 Cellulitis of left lower limb: Secondary | ICD-10-CM | POA: Diagnosis not present

## 2019-06-27 DIAGNOSIS — L03115 Cellulitis of right lower limb: Secondary | ICD-10-CM | POA: Diagnosis not present

## 2019-06-27 DIAGNOSIS — R102 Pelvic and perineal pain: Secondary | ICD-10-CM | POA: Diagnosis not present

## 2019-06-27 LAB — BASIC METABOLIC PANEL
Anion gap: 10 (ref 5–15)
BUN: 23 mg/dL (ref 8–23)
CO2: 27 mmol/L (ref 22–32)
Calcium: 8.5 mg/dL — ABNORMAL LOW (ref 8.9–10.3)
Chloride: 100 mmol/L (ref 98–111)
Creatinine, Ser: 0.96 mg/dL (ref 0.44–1.00)
GFR calc Af Amer: >60 mL/min (ref >60)
GFR calc non Af Amer: >60 mL/min (ref >60)
Glucose, Bld: 103 mg/dL — ABNORMAL HIGH (ref 70–99)
Potassium: 4.5 mmol/L (ref 3.5–5.1)
Sodium: 137 mmol/L (ref 135–145)

## 2019-06-27 LAB — CBC
HCT: 30.2 % — ABNORMAL LOW (ref 36.0–46.0)
Hemoglobin: 9.1 g/dL — ABNORMAL LOW (ref 12.0–15.0)
MCH: 27.8 pg (ref 26.0–34.0)
MCHC: 30.1 g/dL (ref 30.0–36.0)
MCV: 92.4 fL (ref 80.0–100.0)
Platelets: 260 10*3/uL (ref 150–400)
RBC: 3.27 MIL/uL — ABNORMAL LOW (ref 3.87–5.11)
RDW: 17.4 % — ABNORMAL HIGH (ref 11.5–15.5)
WBC: 5.1 10*3/uL (ref 4.0–10.5)
nRBC: 0 % (ref 0.0–0.2)

## 2019-06-27 LAB — GLUCOSE, CAPILLARY: Glucose-Capillary: 96 mg/dL (ref 70–99)

## 2019-06-27 NOTE — Progress Notes (Signed)
Family Medicine Teaching Service Daily Progress Note Intern Pager: [phone]  Patient name: [name] Medical record number: [id number] Date of birth: Dec 03, 1950 Age: 68 y.o. Gender: female  Primary Care Provider: [name], DO Consultants: Vascular surgery, Psych Code Status: Full  Pt Overview and Major Events to Date:  10/22 - admitted to [facility], for worsening of chronic LE wounds and concern for cellulitis  10/23 - MRI R tibia/fibula negative for osteo, VVS consulted - not arterial   Antibiotics Course: Vanc (10/22-10/25) Zosyn (10/22) Cefepime (10/22-10/25) Cipro (10/25-10/29) Flagyl (10/22-10/29)   Assessment and Plan: [name] is a 68 y.o. female presenting with worsening bilateral lower extremity erythema in the setting of chronic insufficiency with stasis ulcer, suspicious for cellulitis. PMH is significant for chronic venous insufficiency with stasis ulcers, atrial fibrillation, borderline personality disorder, diabetes, normocytic anemia, chronic pain.  Social Concerns: Homelessness Prior to admission, patient lived alone in [place]. Patient recently evicted from apartment due to hygiene issue. Has history of being discharged from numerous home health agencies and refusing SNF. CSW consulted, attempting d/c to [place] who has since refused patient. -Plan to discharge to skilled nursing facility tomorrow.  Bilateral LE cellulitis, in setting of chronic venous insufficiency with stasis ulcers:  Patient completed her course of Cipro and Flagyl. S/p Unna boot placement. -Continue Unna boots, changed weekly. -Patient completed course of Cipro and Flagyl. -Arterial Dopplers bilaterally, refused -PT/OT - recommended SNF  Paroxysmal atrial fibrillation: Chronic, stable non-RVR EKG on admission with a-flutter/fib, continues to be rate controlled. Remains assymptomatic. CHA2DS2-VASc = 3, however has continued to refuse anticoagulation.  - continue  Metoprolol 12.5mg  BID.  Borderline personality disorder  MDD Strong history of being verbally abusive and aggressive. Started on Cymbalta during last admission but self-discontinued on discharge. Restarted this admission and endorses improvement with Cymbalta now. - Continue Cymbalta 40mg  QD   CKD, stage IIIa: stable Cr at baseline, 1.10 on 10/30. Baseline around 1.0-1.2. Cr stable. -Monitor BMP  Diabetes mellitus, type II: Recent diagnosis, stable. A1C 6.6 in 04/2019. CBGs 85-121 overnight.    Normocytic anemia: Chronic, decreasing but still above transfusion threshold. Hgb stable, 8.9 on 10/30. Baseline appears to be around 9-11. Most recent labs notable for iron deficiency. Did not continue iron supplement at prior discharge.  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 Gabapentin increased to 600mg  TID this admission. She is tolerating this well. Tolerating Cymbalta.  - continue Gabapentin 200mg  TID - Cymbalta 40mg  QD  Constipation: Chronic, stable. - continue Miralax QD   FEN/GI: Carb modified PPx: Lovenox  Disposition: stable for d/c to SNF when available  Subjective:  Patient states her only complaint this morning is a bit of migraine.  Patient planning to discharge to skilled nursing facility tomorrow.   Objective: Temp:  [98 F (36.7 C)-98.2 F (36.8 C)] 98 F (36.7 C) (10/31 0544) Pulse Rate:  [67-70] 67 (10/31 0544) Resp:  [19] 19 (10/30 1821) BP: (96-100)/(43-64) 96/64 (10/31 0544) SpO2:  [96 %-99 %] 96 % (10/31 0544) Weight:  [78.6 kg] 78.6 kg (10/31 04-02-1993)  Physical Exam: General: Alert and oriented in no apparent distress Heart: S1, S2 with no murmurs appreciated Lungs: CTA bilaterally, no wheezing Abdomen: Bowel sounds present, no abdominal pain Skin: Warm and dry Extremities: Lower limbs in Unna  boots  Laboratory: Recent Labs  Lab 06/24/19 0513 06/25/19 0411 06/26/19  0342  WBC 5.1 5.7 5.6  HGB 8.6* 9.1* 8.9*  HCT 28.4* 30.5* 29.8*  PLT 298 328 295   Recent Labs  Lab 06/24/19 0513 06/25/19 0411 06/26/19 0342  NA 137 137 138  K 4.3 4.2 4.4  CL 100 101 100  CO2 30 28 29   BUN 23 28* 30*  CREATININE 1.03* 0.96 1.10*  CALCIUM 8.4* 8.5* 8.7*  GLUCOSE 113* 89 92    Imaging/Diagnostic Tests: No results found.  Lurline Del, DO 06/27/2019, 7:36 AM PGY-1, [place] Intern pager: [phone], text pages welcome

## 2019-06-28 DIAGNOSIS — Z20828 Contact with and (suspected) exposure to other viral communicable diseases: Secondary | ICD-10-CM | POA: Diagnosis not present

## 2019-06-28 DIAGNOSIS — F17210 Nicotine dependence, cigarettes, uncomplicated: Secondary | ICD-10-CM | POA: Diagnosis not present

## 2019-06-28 DIAGNOSIS — Z9114 Patient's other noncompliance with medication regimen: Secondary | ICD-10-CM | POA: Diagnosis not present

## 2019-06-28 DIAGNOSIS — Z23 Encounter for immunization: Secondary | ICD-10-CM | POA: Diagnosis not present

## 2019-06-28 DIAGNOSIS — R32 Unspecified urinary incontinence: Secondary | ICD-10-CM | POA: Diagnosis not present

## 2019-06-28 DIAGNOSIS — K5909 Other constipation: Secondary | ICD-10-CM | POA: Diagnosis not present

## 2019-06-28 DIAGNOSIS — R102 Pelvic and perineal pain: Secondary | ICD-10-CM | POA: Diagnosis present

## 2019-06-28 DIAGNOSIS — L03116 Cellulitis of left lower limb: Secondary | ICD-10-CM | POA: Diagnosis not present

## 2019-06-28 DIAGNOSIS — I872 Venous insufficiency (chronic) (peripheral): Secondary | ICD-10-CM | POA: Diagnosis not present

## 2019-06-28 DIAGNOSIS — L03115 Cellulitis of right lower limb: Secondary | ICD-10-CM | POA: Diagnosis not present

## 2019-06-28 DIAGNOSIS — E1122 Type 2 diabetes mellitus with diabetic chronic kidney disease: Secondary | ICD-10-CM | POA: Diagnosis not present

## 2019-06-28 DIAGNOSIS — E876 Hypokalemia: Secondary | ICD-10-CM | POA: Diagnosis not present

## 2019-06-28 DIAGNOSIS — Z993 Dependence on wheelchair: Secondary | ICD-10-CM | POA: Diagnosis not present

## 2019-06-28 DIAGNOSIS — F603 Borderline personality disorder: Secondary | ICD-10-CM | POA: Diagnosis not present

## 2019-06-28 DIAGNOSIS — B871 Wound myiasis: Secondary | ICD-10-CM | POA: Diagnosis not present

## 2019-06-28 DIAGNOSIS — M1990 Unspecified osteoarthritis, unspecified site: Secondary | ICD-10-CM | POA: Diagnosis not present

## 2019-06-28 DIAGNOSIS — M792 Neuralgia and neuritis, unspecified: Secondary | ICD-10-CM | POA: Diagnosis not present

## 2019-06-28 DIAGNOSIS — F330 Major depressive disorder, recurrent, mild: Secondary | ICD-10-CM | POA: Diagnosis not present

## 2019-06-28 DIAGNOSIS — N1831 Chronic kidney disease, stage 3a: Secondary | ICD-10-CM | POA: Diagnosis not present

## 2019-06-28 DIAGNOSIS — G14 Postpolio syndrome: Secondary | ICD-10-CM | POA: Diagnosis not present

## 2019-06-28 DIAGNOSIS — I482 Chronic atrial fibrillation, unspecified: Secondary | ICD-10-CM | POA: Diagnosis not present

## 2019-06-28 DIAGNOSIS — Z5329 Procedure and treatment not carried out because of patient's decision for other reasons: Secondary | ICD-10-CM | POA: Diagnosis not present

## 2019-06-28 DIAGNOSIS — G894 Chronic pain syndrome: Secondary | ICD-10-CM | POA: Diagnosis not present

## 2019-06-28 DIAGNOSIS — Z9119 Patient's noncompliance with other medical treatment and regimen: Secondary | ICD-10-CM | POA: Diagnosis not present

## 2019-06-28 DIAGNOSIS — D509 Iron deficiency anemia, unspecified: Secondary | ICD-10-CM | POA: Diagnosis not present

## 2019-06-28 LAB — BASIC METABOLIC PANEL
Anion gap: 9 (ref 5–15)
BUN: 21 mg/dL (ref 8–23)
CO2: 30 mmol/L (ref 22–32)
Calcium: 8.9 mg/dL (ref 8.9–10.3)
Chloride: 102 mmol/L (ref 98–111)
Creatinine, Ser: 1.02 mg/dL — ABNORMAL HIGH (ref 0.44–1.00)
GFR calc Af Amer: >60 mL/min (ref >60)
GFR calc non Af Amer: 56 mL/min — ABNORMAL LOW (ref >60)
Glucose, Bld: 101 mg/dL — ABNORMAL HIGH (ref 70–99)
Potassium: 4.2 mmol/L (ref 3.5–5.1)
Sodium: 141 mmol/L (ref 135–145)

## 2019-06-28 LAB — CBC
HCT: 30.6 % — ABNORMAL LOW (ref 36.0–46.0)
Hemoglobin: 9.2 g/dL — ABNORMAL LOW (ref 12.0–15.0)
MCH: 27.9 pg (ref 26.0–34.0)
MCHC: 30.1 g/dL (ref 30.0–36.0)
MCV: 92.7 fL (ref 80.0–100.0)
Platelets: 284 10*3/uL (ref 150–400)
RBC: 3.3 MIL/uL — ABNORMAL LOW (ref 3.87–5.11)
RDW: 17.7 % — ABNORMAL HIGH (ref 11.5–15.5)
WBC: 4.5 10*3/uL (ref 4.0–10.5)
nRBC: 0 % (ref 0.0–0.2)

## 2019-06-28 LAB — NOVEL CORONAVIRUS, NAA (HOSP ORDER, SEND-OUT TO REF LAB; TAT 18-24 HRS)

## 2019-06-28 MED ORDER — GABAPENTIN 300 MG PO CAPS: 600.0000 mg | ORAL_CAPSULE | Freq: Three times a day (TID) | ORAL | 0 refills | Status: DC

## 2019-06-28 MED ORDER — METOPROLOL TARTRATE 25 MG/10 ML ORAL SUSPENSION
6.2500 mg | Freq: Two times a day (BID) | ORAL | Status: DC
  Administered 2019-06-28 – 2019-06-29 (×2): 6.25 mg via ORAL
  Filled 2019-06-28 (×3): qty 5

## 2019-06-28 MED ORDER — METOPROLOL TARTRATE 25 MG PO TABS: 12.5000 mg | ORAL_TABLET | Freq: Two times a day (BID) | ORAL | 0 refills | Status: DC

## 2019-06-28 MED ORDER — DULOXETINE HCL 40 MG PO CPEP: 40.0000 mg | ORAL_CAPSULE | Freq: Every day | ORAL | 0 refills | Status: DC

## 2019-06-28 MED ORDER — GERHARDT'S BUTT CREAM: 1.0000 "application " | TOPICAL_CREAM | Freq: Two times a day (BID) | CUTANEOUS | 0 refills | Status: DC

## 2019-06-28 MED ORDER — JUVEN PO PACK: 1.0000 | PACK | Freq: Two times a day (BID) | ORAL | 0 refills | Status: DC

## 2019-06-28 MED ORDER — HYDROXYZINE HCL 25 MG PO TABS: 25.0000 mg | ORAL_TABLET | Freq: Three times a day (TID) | ORAL | 0 refills | Status: DC | PRN

## 2019-06-28 NOTE — Progress Notes (Signed)
Patient's BP 86/42. Checked BP in both arms and confirmed manually. Patient denies any symptoms and is alert and oriented x4. Held morning dose of metoprolol. Notified Dr. Volanda Napoleon who stated to recheck BP in an hour and that the team would be rounding on patient shortly.

## 2019-06-28 NOTE — Progress Notes (Signed)
RN called [facility] to follow up about pending Covid test results. [facility] rep states that patient's test had a flag on it  stating that it required verification. Rep stated that she did not know the estimated timing of when the results may be available, but that she would contact her supervisor and call RN back when she received a response from her supervisor. RN to await return call. CSW updated.

## 2019-06-28 NOTE — TOC Progression Note (Signed)
Transition of Care [facility]) - Progression Note    Patient Details  Name: [name] MRN: [id number] Date of Birth: 05/12/1951  Transition of Care [facility]) CM/SW [place], [place] Phone Number: [phone] 06/28/2019, 10:34 AM  Clinical Narrative:     CSW followed up with RN in regards to the [place] test still pending. CSW was informed that it was a send out test. CSW informed RN that CSW will continue to monitor for results.  CSW called [facility] to ensure that facility could take patient today and CSW had to leave a message.   CSW will continue to monitor for discharge planning.   Expected Discharge Plan: [place] Barriers to Discharge: Continued Medical Work up  Expected Discharge Plan and Services Expected Discharge Plan: [place] In-house Referral: Clinical Social Work Discharge Planning Services: NA Post Acute Care Choice: NA(undetermined at this time) Living arrangements for the past 2 months: Apartment Expected Discharge Date: 06/22/19                                     Social Determinants of Health (SDOH) Interventions    Readmission Risk Interventions No flowsheet data found.

## 2019-06-28 NOTE — Progress Notes (Signed)
Family Medicine Teaching Service Daily Progress Note Intern Pager: [phone]  Patient name: [name] Medical record number: [id number] Date of birth: 1951/04/26 Age: 68 y.o. Gender: female  Primary Care Provider: [name], DO Consultants: Psych, wound care, Vascular surgery  Code Status: Full Code   Pt Overview and Major Events to Date:  Hospital Day: 11 10/22 - admitted to FPTS, for worsening of chronic LE wounds and concern for cellulitis  10/23 - MRI R tibia/fibula negative for osteo, VVS consulted - not arterial  Antibiotics Course: Vanc (10/22-10/25) Zosyn (10/22) Cefepime (10/22-10/25) Cipro (10/25-10/29) Flagyl (10/22-10/29)   Assessment and Plan: Archie Endo a 68 y.o.femalepresenting with worsening bilateral lower extremity erythema in the setting of chronic insufficiency with stasis ulcer, suspicious for cellulitis. PMH is significant forchronic venous insufficiency with stasis ulcers, atrial fibrillation, borderline personality disorder, diabetes, normocytic anemia, chronic pain.  Bilateral LE cellulitis,in setting of chronic venous insufficiency with stasis ulcers: Patient completed her course of Cipro and Flagyl. Refused arterial dopplers.  - PT/OT recs for SNF  - continue unna boots, change weekly  - Plan to discharge to skilled nursing facility today  - covid pending  Paroxysmal atrial fibrillation: Chronic, stable non-RVR Patient continuing to refuse anticoagulation, but on VTE pppx lovenox. Last EKG on 06/21/19 showing a flutter. Discontinued cardiac monitoring. Denies SOB.  - continue Metoprolol 12.5mg  BID.  Borderline personality disorderMDD Strong history of being verbally abusive and aggressive. Started on Cymbalta during last admission but self-discontinued on discharge. Restarted this admission and endorses improvement with Cymbalta now. - Continue Cymbalta 40mg  QD   IDA, Chronic Hgb stable during admission 8-9.  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 Gabapentin increased to 600mg  TID this admission. She is tolerating this well. Tolerating Cymbalta.  - continue Gabapentin 200mg  TID - Cymbalta 40mg  QD  Constipation: Chronic, stable. - continue Miralax QD   FEN/GI: Carb modified PPx: Lovenox  VTE prophylaxis: Lovenox 40 (CrCl>30)  Disposition: SNF  - likely DC today   Subjective:  NAEO.   Objective: Temp:  [98.1 F (36.7 C)-99 F (37.2 C)] 98.1 F (36.7 C) (11/01 0515) Pulse Rate:  [68-72] 68 (11/01 0515) Resp:  [18-20] 20 (11/01 0515) BP: (85-110)/(35-95) 98/40 (11/01 0515) SpO2:  [95 %-99 %] 95 % (11/01 0515) Weight:  [77.7 kg] 77.7 kg (11/01 0515) Intake/Output      10/31 0701 - 11/01 0700   P.O. 150   Total Intake(mL/kg) 150 (1.9)   Urine (mL/kg/hr) 2300 (1.2)   Stool 0   Total Output 2300   Net -2150       Urine Occurrence 1 x   Stool Occurrence 1 x       Physical Exam: Gen: NAD, alert, non-toxic, sitting comfortably watching TV. HEENT: NCAT. EOMI. No conjunctival pallor or injection. No scleral icterus or injection.  MMM.  Neck: Soft, supple. No LAD. No JVD. CV: RRR.  Normal S1, S2.  RP 2+ bilaterally. Resp: CTAB.  No w/r/r.  No increased WOB Abd: +BS. NTND on palpation. Extremities: Moves extremities spontaneously. Extremities warm and well perfused. Unna boots bilaterally. Some TTP to right ankle near ulcer site.   Laboratory: I have personally read and reviewed all labs and imaging studies.  CBC: Recent Labs  Lab 06/25/19 0411 06/26/19 0342 06/27/19 0809  WBC 5.7 5.6 5.1  HGB 9.1* 8.9* 9.1*  HCT 30.5* 29.8* 30.2*  MCV  93.3 93.1 92.4  PLT 328 295 260   CMP: Recent Labs  Lab 06/25/19 0411 06/26/19 0342 06/27/19 0809  NA 137 138 137  K 4.2 4.4 4.5  CL 101 100 100  CO2 28 29 27   GLUCOSE 89 92 103*  BUN 28* 30* 23  CREATININE  0.96 1.10* 0.96  CALCIUM 8.5* 8.7* 8.5*   CBG: Recent Labs  Lab 06/26/19 0754 06/26/19 1208 06/26/19 1631 06/26/19 2159 06/27/19 0813  GLUCAP 85 113* 117* 186* 96   Micro: 10/22 Covid Negative 10/30 COVID pending for SNF  Imaging/Diagnostic Tests: No recent new images in last 48 hours.   Wilber Oliphant, MD 06/28/2019, 6:55 AM PGY-2, [place] Intern pager: [phone], text pages welcome

## 2019-06-29 ENCOUNTER — Inpatient Hospital Stay: Payer: Medicare Other | Admitting: Family Medicine

## 2019-06-29 DIAGNOSIS — L03116 Cellulitis of left lower limb: Secondary | ICD-10-CM | POA: Diagnosis not present

## 2019-06-29 DIAGNOSIS — R102 Pelvic and perineal pain: Secondary | ICD-10-CM | POA: Diagnosis not present

## 2019-06-29 DIAGNOSIS — Z23 Encounter for immunization: Secondary | ICD-10-CM | POA: Diagnosis present

## 2019-06-29 LAB — BASIC METABOLIC PANEL
Anion gap: 11 (ref 5–15)
BUN: 22 mg/dL (ref 8–23)
CO2: 27 mmol/L (ref 22–32)
Calcium: 8.7 mg/dL — ABNORMAL LOW (ref 8.9–10.3)
Chloride: 100 mmol/L (ref 98–111)
Creatinine, Ser: 0.97 mg/dL (ref 0.44–1.00)
GFR calc Af Amer: >60 mL/min (ref >60)
GFR calc non Af Amer: >60 mL/min (ref >60)
Glucose, Bld: 109 mg/dL — ABNORMAL HIGH (ref 70–99)
Potassium: 4.3 mmol/L (ref 3.5–5.1)
Sodium: 138 mmol/L (ref 135–145)

## 2019-06-29 LAB — CBC
HCT: 33.1 % — ABNORMAL LOW (ref 36.0–46.0)
Hemoglobin: 9.8 g/dL — ABNORMAL LOW (ref 12.0–15.0)
MCH: 28.2 pg (ref 26.0–34.0)
MCHC: 29.6 g/dL — ABNORMAL LOW (ref 30.0–36.0)
MCV: 95.4 fL (ref 80.0–100.0)
Platelets: 254 10*3/uL (ref 150–400)
RBC: 3.47 MIL/uL — ABNORMAL LOW (ref 3.87–5.11)
RDW: 18.5 % — ABNORMAL HIGH (ref 11.5–15.5)
WBC: 5.5 10*3/uL (ref 4.0–10.5)
nRBC: 0 % (ref 0.0–0.2)

## 2019-06-29 LAB — SARS CORONAVIRUS 2 (TAT 6-24 HRS): SARS Coronavirus 2: NEGATIVE

## 2019-06-29 MED ORDER — GABAPENTIN 300 MG PO CAPS: 600.0000 mg | ORAL_CAPSULE | Freq: Three times a day (TID) | ORAL | 0 refills | Status: AC

## 2019-06-29 MED ORDER — DULOXETINE HCL 40 MG PO CPEP: 40.0000 mg | ORAL_CAPSULE | Freq: Every day | ORAL | 0 refills | Status: DC

## 2019-06-29 MED ORDER — METOPROLOL TARTRATE 25 MG PO TABS: 12.5000 mg | ORAL_TABLET | Freq: Two times a day (BID) | ORAL | 0 refills | Status: DC

## 2019-06-29 MED ORDER — HYDROXYZINE HCL 25 MG PO TABS: 25.0000 mg | ORAL_TABLET | Freq: Three times a day (TID) | ORAL | 0 refills | Status: DC | PRN

## 2019-06-29 NOTE — TOC Transition Note (Signed)
Transition of Care [facility]) - CM/SW Discharge Note   Patient Details  Name: [name] MRN: [id number] Date of Birth: Apr 18, 1951  [facility]) CM/SW Contact:  Eileen Stanford, LCSW Phone Number: 06/29/2019, 2:31 PM   Clinical Narrative:   Clinical Social Worker facilitated patient discharge including contacting patient family and facility to confirm patient discharge plans.  Clinical information faxed to facility and family agreeable with plan.  CSW arranged ambulance transport via [place] to [facility] .  RN to call [phone] for report prior to discharge.    Final next level of care: Skilled Nursing Facility Barriers to Discharge: No Barriers Identified   Patient Goals and CMS Choice Patient states their goals for this hospitalization and ongoing recovery are:: " to get a apartment"      Discharge Placement              Patient chooses bed at: [facility] Patient to be transferred to facility by: [place] Name of family member notified: Pt alert and oriented Patient and family notified of of transfer: 06/29/19  Discharge Plan and Services In-house Referral: Clinical Social Work Discharge Planning Services: NA Post Acute Care Choice: NA(undetermined at this time)                               Social Determinants of Health (SDOH) Interventions     Readmission Risk Interventions No flowsheet data found.

## 2019-06-29 NOTE — Discharge Summary (Addendum)
Family Medicine Teaching [facility] Discharge Summary  Patient name: [name] Medical record number: [id number] Date of birth: 08-15-1951 Age: 68 y.o. Gender: female Date of Admission: 06/18/2019  Date of Discharge: 06/29/19  Admitting Physician: Moses Manners, MD  Primary Care Provider: [name], DO Consultants: Vascular, Psych   Indication for Hospitalization: B/L Venous stasis ulcers  Discharge Diagnoses/Problem List:  Principal Problem:   Cellulitis of right lower extremity Active Problems:   Pressure injury of skin   Cellulitis of left lower extremity   Elevated C-reactive protein (CRP)   Depression, major, recurrent, mild (HCC)  Disposition: SNF   Discharge Condition: Stable  Discharge Exam:   GEN: elderly female in no acute distress  CV: Regular rate irregularly irregular rhythm,no murmurs appreciated  RESP: no increased work of breathing, clear to ascultation bilaterally ABD: Bowel sounds present. Soft, Nontender, Nondistended MSK: no appreciable lower extremity edema, or cyanosis noted SKIN: warm, dry, bilateral lower extremity venous stasis ulcers wrapped, dressings clean dry and intact NEURO: grossly normal, moves all extremities appropriately      Brief Hospital Course:  [name] [facility] a 68 y.o.femalewho presented with worsening bilateral lower extremity erythema in the setting of chronic insufficiency with stasis ulcer, suspicious for cellulitis.  Leg pain/stasis ulcer/cellulitis Patient presented to the emergency department with nonhealing right pretibial ulcer with erythema of both lower limbs secondary to chronic venous stasis. Wound care followed patient during her admission. Patient was initially started on IV vancomycin was transitioned to cefepime and Flagyl and eventually transitioned to Flagyl and Cipro.  X-rays and MRI were negative for osteomyelitis. Vascular was consulted and recommended the patient continue with an  Unna boot to increase the prospects of healing.  Patient will be discharged with Unna boot to be changed weekly.  Patient completed her 8-day course of Flagyl and ciprofloxacin prior to discharge.  Borderline personality disorder Throughout the hospitalization patient was regularly verbally abusive towards staff often using profanity and yelling at staff.  Psychiatry was consulted to assess for potential medication management.  The recommendations were to increase the Cymbalta to 40 mg/day.  Social concerns: Homelessness Prior to discharge patient received word that her previous place of residence, [name] had evicted her due to hygiene issues.  Social worker was brought on board and were successful in assisting place the patient with skilled nursing facility.  Issues for Follow Up:  1. Patient should follow up weekly to have her Unna boot changed 2. Patient was started on metoprolol due to paroxysmal A. fib.  Significant Procedures: None  Significant Labs and Imaging:  Recent Labs  Lab 06/27/19 0809 06/28/19 1027 06/29/19 0705  WBC 5.1 4.5 5.5  HGB 9.1* 9.2* 9.8*  HCT 30.2* 30.6* 33.1*  PLT 260 284 254   Recent Labs  Lab 06/25/19 0411 06/26/19 0342 06/27/19 0809 06/28/19 1027 06/29/19 0705  NA 137 138 137 141 138  K 4.2 4.4 4.5 4.2 4.3  CL 101 100 100 102 100  CO2 28 29 27 30 27   GLUCOSE 89 92 103* 101* 109*  BUN 28* 30* 23 21 22   CREATININE 0.96 1.10* 0.96 1.02* 0.97  CALCIUM 8.5* 8.7* 8.5* 8.9 8.7*    Dg Tibia/fibula Left  Result Date: 06/20/2019 CLINICAL DATA:  Worsening erythema, suspected cellulitis EXAM: LEFT TIBIA AND FIBULA - 2 VIEW COMPARISON:  05/04/2019 FINDINGS: No fracture or dislocation of the left tibia or fibula. Knee joint arthrosis. Soft tissue edema is decreased in comparison to examination dated  05/04/2019. IMPRESSION: No fracture or dislocation of the left tibia or fibula. Soft tissue edema is decreased in comparison to examination dated  05/04/2019. Electronically Signed   By: Eddie Candle M.D.   On: 06/20/2019 16:08   Mr Tibia Fibula Right Wo Contrast  Result Date: 06/19/2019 CLINICAL DATA:  Bilateral leg pain, question of osteomyelitis EXAM: MRI OF LOWER RIGHT EXTREMITY WITHOUT CONTRAST TECHNIQUE: Multiplanar, multisequence MR imaging of the right was performed. No intravenous contrast was administered. COMPARISON:  None. FINDINGS: Bones/Joint/Cartilage Normal osseous marrow signal is seen throughout. No cortical destruction or periosteal reaction. No a vascular necrosis or fracture is seen. No large knee joint effusion is seen. Ligaments Suboptimally visualized Muscles and Tendons There is increased fluid signal seen at the fibular is longus muscle belly at the level of the distal lower extremity best seen on series 8, image 49. Increased feathery signal seen within the anterior muscular compartment and the tibialis posterior muscle belly. There is mild fatty atrophy noted within the muscles surrounding the lower extremity. The tendons appear to be intact. Soft tissues There is a focal area of superficial ulceration overlying the anterolateral aspect of the lower extremity. There is diffuse skin thickening with subcutaneous edema. No loculated fluid collection however is noted. IMPRESSION: 1. Intramuscular strain/myositis of the fibularis longus. 2. Muscular edema involving the anterior muscular compartment and tibialis posterior. 3. No definite evidence of osteomyelitis. 4. Focal areas of superficial ulceration and edema which could be due to cellulitis involving the mid to distal lower extremity. Electronically Signed   By: Prudencio Pair M.D.   On: 06/19/2019 23:20   Dg Chest Portable 1 View  Result Date: 06/18/2019 CLINICAL DATA:  Patient to ED via EMS, reports worsening pain at buttocks and vagina for several days, denies injury or fall, EMS witnessed poor living condition in her apartment with roaches, bed bugs and maggots at  buttocks. Patient added deep.*comment was truncated*shortness of breath EXAM: PORTABLE CHEST 1 VIEW COMPARISON:  None. FINDINGS: Normal mediastinum and cardiac silhouette. Normal pulmonary vasculature. No evidence of effusion, infiltrate, or pneumothorax. No acute bony abnormality. Remote LEFT humeral fracture IMPRESSION: No acute cardiopulmonary process. Electronically Signed   By: Suzy Bouchard M.D.   On: 06/18/2019 08:14   Dg Tibia/fibula Right Port  Result Date: 06/18/2019 CLINICAL DATA:  Leg ulcer EXAM: PORTABLE RIGHT TIBIA AND FIBULA - 2 VIEW COMPARISON:  05/04/2019 FINDINGS: Separate AP views of the proximal and distal RIGHT lower leg. Patient uncooperative for additional imaging, no lateral view obtained. Osseous demineralization. Knee and ankle joint spaces preserved. Diffuse soft tissue swelling of the RIGHT lower leg with large area of soft tissue irregularity and ulceration at the lateral aspect of the distal RIGHT lower leg. No definite fracture, dislocation or bone destruction identified on limited assessment. IMPRESSION: Soft tissue swelling without definite acute bony abnormalities on with assessment. Soft tissue ulcer at lateral aspect of distal RIGHT lower leg. Electronically Signed   By: Lavonia Dana M.D.   On: 06/18/2019 12:32     Results/Tests Pending at Time of Discharge:   Discharge Medications:  Allergies as of 06/29/2019      Reactions   Codeine Other (See Comments)   "Gets high"   Tylenol [acetaminophen] Hives      Medication List    TAKE these medications   DULoxetine HCl 40 MG Cpep Take 40 mg by mouth daily. What changed:   medication strength  how much to take   ferrous sulfate 325 (65 FE)  MG tablet Take 1 tablet (325 mg total) by mouth daily with breakfast.   gabapentin 300 MG capsule Commonly known as: NEURONTIN Take 2 capsules (600 mg total) by mouth 3 (three) times daily. What changed:   medication strength  how much to take   Gerhardt's  butt cream Crea Apply 1 application topically 2 (two) times daily.   hydrOXYzine 25 MG tablet Commonly known as: ATARAX/VISTARIL Take 1 tablet (25 mg total) by mouth 3 (three) times daily as needed for itching, anxiety, nausea or vomiting (Leg pain - likely related to itching with unna boot).   ibuprofen 200 MG tablet Commonly known as: ADVIL Take 400 mg by mouth every 6 (six) hours as needed for moderate pain.   metoprolol tartrate 25 MG tablet Commonly known as: LOPRESSOR Take 0.5 tablets (12.5 mg total) by mouth 2 (two) times daily.   naproxen sodium 220 MG tablet Commonly known as: ALEVE Take 220 mg by mouth daily as needed (pain).   nutrition supplement (JUVEN) Pack Take 1 packet by mouth 2 (two) times daily between meals.   traZODone 50 MG tablet Commonly known as: DESYREL Take 1 tablet (50 mg total) by mouth at bedtime.       Discharge Instructions: Please refer to Patient Instructions section of EMR for full details.  Patient was counseled important signs and symptoms that should prompt return to medical care, changes in medications, dietary instructions, activity restrictions, and follow up appointments.   Follow-Up Appointments: Follow-up Information    [facility] Follow up on 06/29/2019.   Why: @ 11:00am Contact information: [address] [place] [id number] [phone]          Katha CabalBrimage, Vondra, MD 06/29/2019, 2:20 PM PGY-1, [facility] -------------------------------------------------------------------------------------------------- Agree with documentation and exam of Dr. Rachael DarbyBrimage PGY-1  Myrene BuddyJacob Foch Rosenwald MD PGY-3 Family Medicine Resident

## 2019-06-29 NOTE — Progress Notes (Signed)
Attempted to call [facility] to give report. Stated they would call back when ready for report.

## 2019-06-29 NOTE — Progress Notes (Signed)
Occupational Therapy Treatment Patient Details Name: [name] MRN: [id number] DOB: 02-Apr-1951 Today's Date: 06/29/2019    History of present illness 68 yo female with onset of significant wounds and ulcerations of LE's along with cellulitis was admitted for management of her infection process.  Pt has been home with inadequate care but has been refusing a rehab setting.  PMHx:  L humeral fracture, venous insufficiency, PAF, bipolar and borderline personality disorder, CKD3, DM, polio, c-diff, living with bedbugs and other pests   OT comments  Pt making minimal progress towards OT goals this session. Session focus on LB/ UB dressing at EOB and sit>stand as precursor to higher level ADLs. Pt to DC today to SNF, only agreeable to dressing in prep for DC. Pt presents with self- limiting behaviors throughout session and can become easily agitated. Pt requires set-up/ supervision for UB bathing and MAX A for LB dressing. Pt attempts to initiate LB dressing but suddenly reports dizziness and requires MAX- totatl A to complete task. Pt stood x2 trials requiring MOD A and only able stand partially for ~ 30 seconds. DC plan remains appropriate. Will continue to follow acutely per POC.     Follow Up Recommendations  SNF;Supervision/Assistance - 24 hour    Equipment Recommendations  Other (comment)(tbd to next venue of care)    Recommendations for Other Services      Precautions / Restrictions Precautions Precautions: Fall Precaution Comments: pt has painful and ulcerated lower legs Restrictions Weight Bearing Restrictions: No       Mobility Bed Mobility Overal bed mobility: Needs Assistance Bed Mobility: Supine to Sit;Sit to Supine     Supine to sit: HOB elevated;Min assist Sit to supine: Min assist;HOB elevated   General bed mobility comments: heavy use of bed features for supine>sit, required assist from therapist to scoot hips forward; MIN A for sit>supine to manage BLEs back to  bed  Transfers Overall transfer level: Needs assistance Equipment used: Rolling walker (2 wheeled) Transfers: Sit to/from Stand Sit to Stand: Mod assist;Min assist         General transfer comment: sit>stand x2 with RW; first trial MIN A given by therapist,pt reports "freezing" and not able to stand up any further, MODA needed for second trial with pt again not able to stand all the way up, pt only able to stand ~ 30 seconds to pull up pants to waist line before needing to sit    Balance Overall balance assessment: History of Falls;Needs assistance Sitting-balance support: No upper extremity supported;Bilateral upper extremity supported Sitting balance-Leahy Scale: Poor     Standing balance support: Bilateral upper extremity supported Standing balance-Leahy Scale: Poor Standing balance comment: reliant on BUE and external support                           ADL either performed or assessed with clinical judgement   ADL Overall ADL's : Needs assistance/impaired                 Upper Body Dressing : Supervision/safety;Set up;Sitting   Lower Body Dressing: Maximal assistance;Sit to/from stand Lower Body Dressing Details (indicate cue type and reason): pt only able to tolerate standing ~ 30 seconds before needing to sit; MAX to thread pants d/t pt inability to reach feet and MAX A to pull up pants in standing d/t poor standing balance   Toilet Transfer Details (indicate cue type and reason): refusing  Functional mobility during ADLs: Moderate assistance;Rolling walker(sit>stand x 2) General ADL Comments: set-upA for UB ADL in sitting and MAX A for LB ADL in sitting. pt with limited standing tolerance and can not stand fully; reports fear of falling when reaching forward limiting her ability to complete LB dressing, pt can become quickly agitated if things aren't done her way     Vision Baseline Vision/History: No visual deficits Vision Assessment?: No  apparent visual deficits   Perception     Praxis      Cognition Arousal/Alertness: Awake/alert Behavior During Therapy: WFL for tasks assessed/performed;Agitated Overall Cognitive Status: History of cognitive impairments - at baseline                                 General Comments: pt presents with self limiting behaviors, when asked to don pants pt all of a sudden reports dizziness and refuses to participate in session in further        Exercises     Shoulder Instructions       General Comments      Pertinent Vitals/ Pain       Faces Pain Scale: Hurts even more Pain Location: R heel Pain Descriptors / Indicators: Discomfort;Grimacing Pain Intervention(s): Limited activity within patient's tolerance;Monitored during session;Repositioned  Home Living                                          Prior Functioning/Environment              Frequency  Min 2X/week        Progress Toward Goals  OT Goals(current goals can now be found in the care plan section)  Progress towards OT goals: Not progressing toward goals - comment(self limiting behaviors)  Acute Rehab OT Goals Patient Stated Goal: to get pain to go away and get home OT Goal Formulation: With patient Time For Goal Achievement: 07/05/19 Potential to Achieve Goals: Good  Plan Discharge plan remains appropriate    Co-evaluation                 AM-PAC OT "6 Clicks" Daily Activity     Outcome Measure   Help from another person eating meals?: None Help from another person taking care of personal grooming?: A Little Help from another person toileting, which includes using toliet, bedpan, or urinal?: A Lot Help from another person bathing (including washing, rinsing, drying)?: A Lot Help from another person to put on and taking off regular upper body clothing?: A Little Help from another person to put on and taking off regular lower body clothing?: A Lot 6 Click  Score: 16    End of Session Equipment Utilized During Treatment: Rolling walker  OT Visit Diagnosis: Unsteadiness on feet (R26.81);Muscle weakness (generalized) (M62.81);Repeated falls (R29.6);Pain Pain - Right/Left: Right Pain - part of body: Leg   Activity Tolerance Patient tolerated treatment well;Treatment limited secondary to agitation;Other (comment)(self limiting)   Patient Left in bed;with call bell/phone within reach;with bed alarm set   Nurse Communication Mobility status;Other (comment)(dressed and ready for DC)        Time: 3267-1245 OT Time Calculation (min): 14 min  Charges: OT General Charges $OT Visit: 1 Visit OT Treatments $Self Care/Home Management : 8-22 mins  Pollyann Glen C., COTA/L Acute Rehabilitation Services [phone]  [name] 06/29/2019, 2:39 PM

## 2019-06-29 NOTE — Progress Notes (Signed)
Attempted to contact social work, Armed forces technical officer to  inform her about difficulty giving report to [employer].

## 2019-06-29 NOTE — Care Management Important Message (Signed)
Important Message  Patient Details  Name: [name] MRN: [id number] Date of Birth: 1951/04/26   Medicare Important Message Given:  Yes     Memory Argue 06/29/2019, 3:29 PM

## 2019-06-29 NOTE — Progress Notes (Signed)
Attempted to call report at [facility]. Secretary transferred me to RN 4 times and no one answered. Pt is being transported EMS and will arrive to [facility] shortly. I have now attempted to give report to receiving RN 5 times. Charge nurse Cyril Mourning ,RN aware.

## 2019-07-28 ENCOUNTER — Encounter (HOSPITAL_COMMUNITY): Payer: Self-pay | Admitting: Emergency Medicine

## 2019-07-28 ENCOUNTER — Emergency Department (HOSPITAL_COMMUNITY)
Admission: EM | Admit: 2019-07-28 | Discharge: 2019-07-28 | Disposition: A | Discharge status: Home / Self Care | Payer: Medicare Other | Attending: Emergency Medicine | Admitting: Emergency Medicine

## 2019-07-28 ENCOUNTER — Emergency Department (HOSPITAL_COMMUNITY): Payer: Medicare Other

## 2019-07-28 DIAGNOSIS — Y92129 Unspecified place in nursing home as the place of occurrence of the external cause: Secondary | ICD-10-CM | POA: Insufficient documentation

## 2019-07-28 DIAGNOSIS — E119 Type 2 diabetes mellitus without complications: Secondary | ICD-10-CM | POA: Insufficient documentation

## 2019-07-28 DIAGNOSIS — Y999 Unspecified external cause status: Secondary | ICD-10-CM | POA: Insufficient documentation

## 2019-07-28 DIAGNOSIS — Z79899 Other long term (current) drug therapy: Secondary | ICD-10-CM | POA: Insufficient documentation

## 2019-07-28 DIAGNOSIS — F17200 Nicotine dependence, unspecified, uncomplicated: Secondary | ICD-10-CM | POA: Diagnosis not present

## 2019-07-28 DIAGNOSIS — Y939 Activity, unspecified: Secondary | ICD-10-CM | POA: Insufficient documentation

## 2019-07-28 DIAGNOSIS — S0083XA Contusion of other part of head, initial encounter: Secondary | ICD-10-CM | POA: Insufficient documentation

## 2019-07-28 DIAGNOSIS — R42 Dizziness and giddiness: Secondary | ICD-10-CM | POA: Diagnosis present

## 2019-07-28 DIAGNOSIS — W010XXA Fall on same level from slipping, tripping and stumbling without subsequent striking against object, initial encounter: Secondary | ICD-10-CM | POA: Insufficient documentation

## 2019-07-28 DIAGNOSIS — W19XXXA Unspecified fall, initial encounter: Secondary | ICD-10-CM

## 2019-07-28 NOTE — ED Provider Notes (Signed)
[facility] EMERGENCY DEPARTMENT Provider Note   CSN: [id number] Arrival date & time: 07/28/19  1040     History   Chief Complaint Chief Complaint  Patient presents with  . Fall  . Leg Swelling    HPI [name] is a 68 y.o. female brought in by EMS from [place] home for evaluation of fall.  Patient was standing up to get a sponge bath and getting her dressing change when she started feeling lightheaded and fell.  Patient does not think she had LOC.  She states she is not on any blood thinners.  She did not have any preceding chest pain or difficulty breathing prior to fall.  She does not states she had any dizziness but did feel lightheaded like she was going to pass out.  Patient states that she has a slight headache but otherwise denies any symptoms.  She states that her bilateral lower extremities have wounds that weep for multiple years.  She is following with surgeon for evaluation of her wounds.  She denies any new or changes in wounds.  Patient denies any chest pain, difficulty breathing, vision changes, nausea/vomiting.  She is not on any blood thinners.      The history is provided by the patient.    Past Medical History:  Diagnosis Date  . Arthritis   . Hypoglycemia   . Pneumonia, pneumococcal [facility])     Patient Active Problem List   Diagnosis Date Noted  . Depression, major, recurrent, mild ([place]) 06/21/2019  . Pressure injury of skin 06/19/2019  . Cellulitis of left lower extremity   . Elevated C-reactive protein (CRP)   . Cellulitis of right lower extremity 06/18/2019  . Leg ulcer ([place])   . Adjustment disorder with mixed disturbance of emotions and conduct   . Type 2 diabetes mellitus with hyperglycemia ([place]) 05/04/2019  . AKI (acute kidney injury) ([place])   . Infected ulcer of skin ([place])   . High risk social situation   . Post-polio syndrome   . Chronic pain syndrome   . Limited mobility 04/20/2019  . Venous stasis ulcer  ([place]) 03/31/2019  . Overgrown toenails 10/28/2018  . Wound of right leg 02/06/2018  . Atrial fibrillation ([place]) 01/03/2018  . Anemia 01/03/2018  . Elevated serum creatinine 01/03/2018  . Bipolar I disorder with mania ([place]) 01/03/2018    Past Surgical History:  Procedure Laterality Date  . ABDOMINAL HYSTERECTOMY    . CESAREAN SECTION       OB History    Gravida      Para      Term      Preterm      AB      Living  3     SAB      TAB      Ectopic      Multiple      Live Births               Home Medications    Prior to Admission medications   Medication Sig Start Date End Date Taking? Authorizing Provider  DULoxetine HCl 40 MG CPEP Take 40 mg by mouth daily. 06/29/19   Guadalupe Dawn, MD  ferrous sulfate 325 (65 FE) MG tablet Take 1 tablet (325 mg total) by mouth daily with breakfast. Patient not taking: Reported on 06/18/2019 05/23/19   Mina Marble P, DO  gabapentin (NEURONTIN) 300 MG capsule Take 2 capsules (600 mg total) by mouth 3 (three)  times daily. 06/29/19 07/29/19  Myrene BuddyFletcher, Jacob, MD  Hydrocortisone (GERHARDT'S BUTT CREAM) CREA Apply 1 application topically 2 (two) times daily. 06/28/19   Melene PlanKim, Rachel E, MD  hydrOXYzine (ATARAX/VISTARIL) 25 MG tablet Take 1 tablet (25 mg total) by mouth 3 (three) times daily as needed for itching, anxiety, nausea or vomiting (Leg pain - likely related to itching with unna boot). 06/29/19   Myrene BuddyFletcher, Jacob, MD  ibuprofen (ADVIL) 200 MG tablet Take 400 mg by mouth every 6 (six) hours as needed for moderate pain.    [provider]  metoprolol tartrate (LOPRESSOR) 25 MG tablet Take 0.5 tablets (12.5 mg total) by mouth 2 (two) times daily. 06/29/19 07/29/19  Myrene BuddyFletcher, Jacob, MD  naproxen sodium (ALEVE) 220 MG tablet Take 220 mg by mouth daily as needed (pain).    [provider]  nutrition supplement, JUVEN, (JUVEN) PACK Take 1 packet by mouth 2 (two) times daily between meals. 06/28/19   Melene PlanKim, Rachel E, MD   traZODone (DESYREL) 50 MG tablet Take 1 tablet (50 mg total) by mouth at bedtime. Patient not taking: Reported on 06/18/2019 05/22/19   Orpah CobbMullis, Kiersten P, DO  furosemide (LASIX) 20 MG tablet Take 0.5 tablets (10 mg total) by mouth daily. Patient not taking: Reported on 04/18/2018 12/10/17 05/04/19  Arthor CaptainHarris, Abigail, PA-C    Family History No family history on file.  Social History Social History   Tobacco Use  . Smoking status: Heavy Tobacco Smoker    Packs/day: 0.50  . Smokeless tobacco: Never Used  Substance Use Topics  . Alcohol use: No  . Drug use: No     Allergies   Codeine and Tylenol [acetaminophen]   Review of Systems Review of Systems  Constitutional: Negative for fever.  Respiratory: Negative for cough and shortness of breath.   Cardiovascular: Negative for chest pain.  Gastrointestinal: Negative for abdominal pain, nausea and vomiting.  Skin: Positive for wound.  Neurological: Positive for light-headedness and headaches. Negative for weakness and numbness.  All other systems reviewed and are negative.    Physical Exam Updated Vital Signs BP 105/65 (BP Location: Right Arm)   Pulse 75   Temp 98.3 F (36.8 C) (Oral)   Resp 18   SpO2 95%   Physical Exam Vitals signs and nursing note reviewed.  Constitutional:      Appearance: Normal appearance. She is well-developed.  HENT:     Head: Normocephalic and atraumatic.      Comments: Hematoma noted to frontal forehead. No skull   Eyes:     General: Lids are normal.     Conjunctiva/sclera: Conjunctivae normal.     Pupils: Pupils are equal, round, and reactive to light.  Neck:     Musculoskeletal: Full passive range of motion without pain.  Cardiovascular:     Rate and Rhythm: Normal rate and regular rhythm.     Pulses: Normal pulses.     Heart sounds: Normal heart sounds. No murmur. No friction rub. No gallop.   Pulmonary:     Effort: Pulmonary effort is normal.     Breath sounds: Normal breath sounds.   Abdominal:     Palpations: Abdomen is soft. Abdomen is not rigid.     Tenderness: There is no abdominal tenderness. There is no guarding.  Musculoskeletal: Normal range of motion.     Comments: Bilateral lower extremities with dressings in place with notable drainage.  Skin:    General: Skin is warm and dry.     Capillary Refill:  Capillary refill takes less than 2 seconds.  Neurological:     Mental Status: She is alert and oriented to person, place, and time.     Comments: Cranial nerves III-XII intact Follows commands, Moves all extremities  5/5 strength to BUE and BLE  Sensation intact throughout all major nerve distributions Normal finger to nose No slurred speech. No facial droop.   Psychiatric:        Speech: Speech normal.      ED Treatments / Results  Labs (all labs ordered are listed, but only abnormal results are displayed) Labs Reviewed  BASIC METABOLIC PANEL  CBC WITH DIFFERENTIAL/PLATELET    EKG None  Radiology Ct Head Wo Contrast  Result Date: 07/28/2019 CLINICAL DATA:  Headache following fall EXAM: CT HEAD WITHOUT CONTRAST TECHNIQUE: Contiguous axial images were obtained from the base of the skull through the vertex without intravenous contrast. COMPARISON:  None. FINDINGS: Brain: There is moderate diffuse atrophy. There is no intracranial mass, hemorrhage, extra-axial fluid collection, or midline shift. There is patchy small vessel disease throughout the centra semiovale bilaterally. There is small vessel disease in each thalamus region. No acute infarct is demonstrable on this study. Vascular: There is no hyperdense vessel. There is calcification in each carotid siphon region. Skull: The bony calvarium appears intact. There is a left frontal scalp hematoma. Sinuses/Orbits: There is opacification and mucosal thickening in the maxillary antra bilaterally. There is mucosal thickening and opacification in multiple ethmoid air cells. There is patchy opacity in the left  sphenoid sinus with a small retention cyst in the right sphenoid sinus. There is mucosal thickening in the frontal sinuses bilaterally. Orbits appear symmetric bilaterally. Other: Mastoid air cells are clear. IMPRESSION: 1. Atrophy with supratentorial small vessel disease. No acute infarct. No mass or hemorrhage. 2.  There are foci of arterial vascular calcification. 3. There is a small left frontal scalp hematoma. No underlying fracture. 4.  Multifocal paranasal sinus disease. Electronically Signed   By: Bretta Bang III M.D.   On: 07/28/2019 14:08    Procedures Procedures (including critical care time)  Medications Ordered in ED Medications - No data to display   Initial Impression / Assessment and Plan / ED Course  I have reviewed the triage vital signs and the nursing notes.  Pertinent labs & imaging results that were available during my care of the patient were reviewed by me and considered in my medical decision making (see chart for details).        68 year old female who presents for evaluation of fall.  Patient reports that she had fallen from a standing position after getting her legs dressing changed.  She denies any LOC.  Not on blood thinners. Patient is afebrile, non-toxic appearing, sitting comfortably on examination table. Vital signs reviewed and stable.  No neuro deficits on exam.  Patient with obvious stasis dermatitis changes to bilateral lower extremities with dressings in place.  Patient does not want me to undress her legs at this time.  We will plan for CT head to ensure that there is no intracranial hemorrhage status post fall.  Discussed patient with Morrie Sheldon Banker) at [facility].  She reports that patient was seen all this morning through breakfast, getting her wound change, getting a shower.  She reports that about 10:00, patient has been getting meds and continue with wound dressing.  About 15 minutes later, they noticed that patient had been on the floor and was  lying down face first.  Patient was conscious  and did not have any LOC.  She states that she had tried to stand up but said she could not which caused her to fall.  Patient was at her baseline was able to answer all questions appropriately.  Nurse reports that patient has been getting wound care and dressing changes to her bilateral lower extremities over last 3 weeks.  She does report that they have been worse and have seemingly become more erythematous, weeping more.  No fevers noted.  CT head shows no evidence of acute abnormality.  I discussed results with patient.  I discussed with patient that I wanted to obtain blood work, and bandaged her legs and evaluate to see if there is any concern for infection.  Patient is refusing to allow me to visualize legs.  She does not want her dressing changed or removed in any way.  When I go to touch her legs, she pulls them away and will not let me evaluate them.  I discussed with patient that I was concerned for infection and patient states she does not want any further work-up here in the emergency department.  I discussed with her my concerns regarding declining further work-up here in emergency department.  Patient expresses understanding and still wishes declined any further work-up.  I offered to have her discussed with Morrie Sheldon the nurse on the phone but patient states she did not want to talk to her.  She states she is ready to go home. Patient is alert and oriented and able to questions without any difficulty.  She expresses understanding.  She exhibits full medical decision-making capacity and appears clinically sober. Discussed patient with Dr. Judd Lien who is agreeable. Patient had ample opportunity for questions and discussion. All patient's questions were answered with full understanding. Strict return precautions discussed. Patient expresses understanding and agreement to plan.   Portions of this note were generated with Scientist, clinical (histocompatibility and immunogenetics). Dictation  errors may occur despite best attempts at proofreading.   Final Clinical Impressions(s) / ED Diagnoses   Final diagnoses:  Fall, initial encounter    ED Discharge Orders    None       Maxwell Caul, PA-C 07/28/19 1655    Geoffery Lyons, MD 07/29/19 [phone]

## 2019-07-28 NOTE — Discharge Instructions (Signed)
Please follow-up with your doctor regarding your wounds.  The need to be reevaluated.  Return the emergency department for any worsening pain, fevers or any other worsening concerning symptoms.

## 2019-07-28 NOTE — ED Notes (Signed)
Pt continued to wait on PTAR

## 2019-07-28 NOTE — ED Triage Notes (Addendum)
Patient coming from [facility] after having a fall. Abrasion above her left eyebrow. Patient having weeping edema from bilateral legs. Patient states she has had the weeping edema for years. C/o headache.

## 2019-07-28 NOTE — ED Notes (Signed)
PTAR called for pt 

## 2019-07-28 NOTE — ED Notes (Signed)
Report called to [facility] and given to [facility]

## 2019-08-12 ENCOUNTER — Inpatient Hospital Stay (HOSPITAL_COMMUNITY)
Admission: EM | Admit: 2019-08-12 | Discharge: 2019-08-14 | DRG: 602 | Disposition: A | Discharge status: Home / Self Care | Payer: Medicare Other | Source: Ambulatory Visit | Attending: Internal Medicine | Admitting: Internal Medicine

## 2019-08-12 ENCOUNTER — Encounter (HOSPITAL_BASED_OUTPATIENT_CLINIC_OR_DEPARTMENT_OTHER): Payer: Medicare Other | Attending: Physician Assistant | Admitting: Physician Assistant

## 2019-08-12 ENCOUNTER — Emergency Department (HOSPITAL_COMMUNITY): Payer: Medicare Other

## 2019-08-12 ENCOUNTER — Encounter (HOSPITAL_COMMUNITY): Payer: Self-pay | Admitting: Emergency Medicine

## 2019-08-12 ENCOUNTER — Other Ambulatory Visit: Payer: Self-pay

## 2019-08-12 DIAGNOSIS — L89610 Pressure ulcer of right heel, unstageable: Secondary | ICD-10-CM | POA: Diagnosis present

## 2019-08-12 DIAGNOSIS — L97929 Non-pressure chronic ulcer of unspecified part of left lower leg with unspecified severity: Secondary | ICD-10-CM | POA: Diagnosis present

## 2019-08-12 DIAGNOSIS — Z20828 Contact with and (suspected) exposure to other viral communicable diseases: Secondary | ICD-10-CM | POA: Diagnosis present

## 2019-08-12 DIAGNOSIS — D509 Iron deficiency anemia, unspecified: Secondary | ICD-10-CM | POA: Diagnosis present

## 2019-08-12 DIAGNOSIS — M1990 Unspecified osteoarthritis, unspecified site: Secondary | ICD-10-CM | POA: Diagnosis present

## 2019-08-12 DIAGNOSIS — Z886 Allergy status to analgesic agent status: Secondary | ICD-10-CM | POA: Insufficient documentation

## 2019-08-12 DIAGNOSIS — F17210 Nicotine dependence, cigarettes, uncomplicated: Secondary | ICD-10-CM | POA: Diagnosis present

## 2019-08-12 DIAGNOSIS — Z7401 Bed confinement status: Secondary | ICD-10-CM

## 2019-08-12 DIAGNOSIS — I83029 Varicose veins of left lower extremity with ulcer of unspecified site: Secondary | ICD-10-CM | POA: Diagnosis present

## 2019-08-12 DIAGNOSIS — F319 Bipolar disorder, unspecified: Secondary | ICD-10-CM | POA: Diagnosis present

## 2019-08-12 DIAGNOSIS — L89620 Pressure ulcer of left heel, unstageable: Secondary | ICD-10-CM | POA: Diagnosis present

## 2019-08-12 DIAGNOSIS — F17200 Nicotine dependence, unspecified, uncomplicated: Secondary | ICD-10-CM | POA: Insufficient documentation

## 2019-08-12 DIAGNOSIS — E43 Unspecified severe protein-calorie malnutrition: Secondary | ICD-10-CM | POA: Diagnosis present

## 2019-08-12 DIAGNOSIS — G894 Chronic pain syndrome: Secondary | ICD-10-CM | POA: Diagnosis present

## 2019-08-12 DIAGNOSIS — Z885 Allergy status to narcotic agent status: Secondary | ICD-10-CM | POA: Insufficient documentation

## 2019-08-12 DIAGNOSIS — E11621 Type 2 diabetes mellitus with foot ulcer: Secondary | ICD-10-CM | POA: Diagnosis present

## 2019-08-12 DIAGNOSIS — L03119 Cellulitis of unspecified part of limb: Secondary | ICD-10-CM | POA: Diagnosis present

## 2019-08-12 DIAGNOSIS — L03115 Cellulitis of right lower limb: Secondary | ICD-10-CM | POA: Diagnosis present

## 2019-08-12 DIAGNOSIS — Z6829 Body mass index (BMI) 29.0-29.9, adult: Secondary | ICD-10-CM

## 2019-08-12 DIAGNOSIS — L97822 Non-pressure chronic ulcer of other part of left lower leg with fat layer exposed: Secondary | ICD-10-CM | POA: Insufficient documentation

## 2019-08-12 DIAGNOSIS — I4891 Unspecified atrial fibrillation: Secondary | ICD-10-CM | POA: Insufficient documentation

## 2019-08-12 DIAGNOSIS — D649 Anemia, unspecified: Secondary | ICD-10-CM | POA: Diagnosis present

## 2019-08-12 DIAGNOSIS — I83019 Varicose veins of right lower extremity with ulcer of unspecified site: Secondary | ICD-10-CM | POA: Diagnosis present

## 2019-08-12 DIAGNOSIS — L03116 Cellulitis of left lower limb: Secondary | ICD-10-CM | POA: Diagnosis present

## 2019-08-12 DIAGNOSIS — R7989 Other specified abnormal findings of blood chemistry: Secondary | ICD-10-CM | POA: Diagnosis present

## 2019-08-12 DIAGNOSIS — I482 Chronic atrial fibrillation, unspecified: Secondary | ICD-10-CM | POA: Diagnosis present

## 2019-08-12 DIAGNOSIS — L97919 Non-pressure chronic ulcer of unspecified part of right lower leg with unspecified severity: Secondary | ICD-10-CM | POA: Diagnosis present

## 2019-08-12 DIAGNOSIS — I4892 Unspecified atrial flutter: Secondary | ICD-10-CM | POA: Diagnosis present

## 2019-08-12 DIAGNOSIS — Z79899 Other long term (current) drug therapy: Secondary | ICD-10-CM

## 2019-08-12 DIAGNOSIS — I890 Lymphedema, not elsewhere classified: Secondary | ICD-10-CM | POA: Insufficient documentation

## 2019-08-12 DIAGNOSIS — Z8701 Personal history of pneumonia (recurrent): Secondary | ICD-10-CM | POA: Diagnosis not present

## 2019-08-12 DIAGNOSIS — L97812 Non-pressure chronic ulcer of other part of right lower leg with fat layer exposed: Secondary | ICD-10-CM | POA: Insufficient documentation

## 2019-08-12 DIAGNOSIS — E1140 Type 2 diabetes mellitus with diabetic neuropathy, unspecified: Secondary | ICD-10-CM | POA: Diagnosis present

## 2019-08-12 LAB — CBC WITH DIFFERENTIAL/PLATELET
Abs Immature Granulocytes: 0.01 10*3/uL (ref 0.00–0.07)
Basophils Absolute: 0.1 10*3/uL (ref 0.0–0.1)
Basophils Relative: 2 %
Eosinophils Absolute: 0.1 10*3/uL (ref 0.0–0.5)
Eosinophils Relative: 2 %
HCT: 32.3 % — ABNORMAL LOW (ref 36.0–46.0)
Hemoglobin: 9.5 g/dL — ABNORMAL LOW (ref 12.0–15.0)
Immature Granulocytes: 0 %
Lymphocytes Relative: 16 %
Lymphs Abs: 0.8 10*3/uL (ref 0.7–4.0)
MCH: 27.3 pg (ref 26.0–34.0)
MCHC: 29.4 g/dL — ABNORMAL LOW (ref 30.0–36.0)
MCV: 92.8 fL (ref 80.0–100.0)
Monocytes Absolute: 0.4 10*3/uL (ref 0.1–1.0)
Monocytes Relative: 8 %
Neutro Abs: 3.5 10*3/uL (ref 1.7–7.7)
Neutrophils Relative %: 72 %
Platelets: 382 10*3/uL (ref 150–400)
RBC: 3.48 MIL/uL — ABNORMAL LOW (ref 3.87–5.11)
RDW: 17.2 % — ABNORMAL HIGH (ref 11.5–15.5)
WBC: 4.8 10*3/uL (ref 4.0–10.5)
nRBC: 0 % (ref 0.0–0.2)

## 2019-08-12 LAB — COMPREHENSIVE METABOLIC PANEL
ALT: 18 U/L (ref 0–44)
AST: 23 U/L (ref 15–41)
Albumin: 2.7 g/dL — ABNORMAL LOW (ref 3.5–5.0)
Alkaline Phosphatase: 80 U/L (ref 38–126)
Anion gap: 9 (ref 5–15)
BUN: 26 mg/dL — ABNORMAL HIGH (ref 8–23)
CO2: 26 mmol/L (ref 22–32)
Calcium: 9.1 mg/dL (ref 8.9–10.3)
Chloride: 102 mmol/L (ref 98–111)
Creatinine, Ser: 1.13 mg/dL — ABNORMAL HIGH (ref 0.44–1.00)
GFR calc Af Amer: 58 mL/min — ABNORMAL LOW (ref >60)
GFR calc non Af Amer: 50 mL/min — ABNORMAL LOW (ref >60)
Glucose, Bld: 117 mg/dL — ABNORMAL HIGH (ref 70–99)
Potassium: 4.7 mmol/L (ref 3.5–5.1)
Sodium: 137 mmol/L (ref 135–145)
Total Bilirubin: 0.5 mg/dL (ref 0.3–1.2)
Total Protein: 8.3 g/dL — ABNORMAL HIGH (ref 6.5–8.1)

## 2019-08-12 LAB — LACTIC ACID, PLASMA
Lactic Acid, Venous: 1.6 mmol/L (ref 0.5–1.9)
Lactic Acid, Venous: 1.3 mmol/L (ref 0.5–1.9)

## 2019-08-12 LAB — SARS CORONAVIRUS 2 (TAT 6-24 HRS): SARS Coronavirus 2: NEGATIVE

## 2019-08-12 MED ORDER — MORPHINE SULFATE (PF) 2 MG/ML IV SOLN
2.0000 mg | INTRAVENOUS | Status: DC | PRN
  Administered 2019-08-13 – 2019-08-14 (×4): 2 mg via INTRAVENOUS
  Filled 2019-08-12 (×4): qty 1

## 2019-08-12 MED ORDER — VANCOMYCIN HCL IN DEXTROSE 1-5 GM/200ML-% IV SOLN
1000.0000 mg | Freq: Once | INTRAVENOUS | Status: DC
  Filled 2019-08-12: qty 200

## 2019-08-12 MED ORDER — PIPERACILLIN-TAZOBACTAM 3.375 G IVPB 30 MIN
3.3750 g | Freq: Once | INTRAVENOUS | Status: AC
  Administered 2019-08-12: 16:00:00 3.375 g via INTRAVENOUS
  Filled 2019-08-12: qty 50

## 2019-08-12 MED ORDER — HEPARIN SODIUM (PORCINE) 5000 UNIT/ML IJ SOLN
5000.0000 [IU] | Freq: Three times a day (TID) | INTRAMUSCULAR | Status: DC
  Administered 2019-08-12 – 2019-08-14 (×3): 5000 [IU] via SUBCUTANEOUS
  Filled 2019-08-12 (×4): qty 1

## 2019-08-12 MED ORDER — MORPHINE SULFATE (PF) 2 MG/ML IV SOLN
INTRAVENOUS | Status: AC
  Administered 2019-08-12: 2 mg via INTRAVENOUS
  Filled 2019-08-12: qty 1

## 2019-08-12 MED ORDER — METRONIDAZOLE IN NACL 5-0.79 MG/ML-% IV SOLN
500.0000 mg | Freq: Three times a day (TID) | INTRAVENOUS | Status: DC
  Administered 2019-08-12 – 2019-08-13 (×2): 500 mg via INTRAVENOUS
  Filled 2019-08-12 (×3): qty 100

## 2019-08-12 MED ORDER — VANCOMYCIN HCL IN DEXTROSE 1-5 GM/200ML-% IV SOLN
1000.0000 mg | INTRAVENOUS | Status: DC
  Administered 2019-08-13: 1000 mg via INTRAVENOUS
  Filled 2019-08-12: qty 200

## 2019-08-12 MED ORDER — MORPHINE SULFATE (PF) 4 MG/ML IV SOLN
4.0000 mg | Freq: Once | INTRAVENOUS | Status: AC
  Administered 2019-08-12: 4 mg via INTRAVENOUS
  Filled 2019-08-12: qty 1

## 2019-08-12 MED ORDER — IBUPROFEN 200 MG PO TABS
200.0000 mg | ORAL_TABLET | Freq: Three times a day (TID) | ORAL | Status: DC | PRN
  Administered 2019-08-13: 200 mg via ORAL
  Filled 2019-08-12: qty 1

## 2019-08-12 MED ORDER — SODIUM CHLORIDE 0.9 % IV SOLN
2.0000 g | Freq: Two times a day (BID) | INTRAVENOUS | Status: DC
  Administered 2019-08-12 – 2019-08-13 (×2): 2 g via INTRAVENOUS
  Filled 2019-08-12 (×3): qty 2

## 2019-08-12 MED ORDER — VANCOMYCIN HCL 1500 MG/300ML IV SOLN
1500.0000 mg | Freq: Once | INTRAVENOUS | Status: AC
  Administered 2019-08-12: 1500 mg via INTRAVENOUS
  Filled 2019-08-12: qty 300

## 2019-08-12 NOTE — H&P (Signed)
History and Physical    [name] JOA:[id number] DOB: [birth year] DOA: 08/12/2019  PCP: [name], DO  Patient coming from: Home    Chief Complaint: Worsening cellulitis of bilateral lower extremities  HPI: [name] is a 68 y.o. female with medical history significant of chronic wounds on bilateral lower extremities, chronic atrial fibrillation not on any anticoagulation due to unknown reasons, bipolar disorder and diabetes mellitus according to the charts but patient denies is transferred from wound care center for evaluation of worsening cellulitis bilateral lower extremities.  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  Patient reports that she was recently on antibiotics for acute cellulitis and was seen by the wound care team and after evaluation they recommended that she should be transferred to ER and admitted for IV antibiotics because her cellulitis is worsening and spreading to her knee specifically on the right side.  Patient further mentioned that she is having occasional purulent foul-smelling discharge from her wounds and also complaining of severe pain that is 8 out of 10 on pain scale at present.  Patient also mentioned that nothing is helping her cellulitis and worsening discomfort and she wants her right lower extremity to be amputated to get rid of this worsening pain.  Patient otherwise denies fever, chills, chest pain, cough, shortness of breath, nausea, vomiting, urinary symptoms and anxiety.  And also admits of smoking half a pack of cigarettes daily and has no history of peripheral arterial disease ED Course: On arrival to ED, patient had blood pressure of 123/74, heart rate 86, temperature 98.5, respiratory rate 20 and saturating at 95% on room air.  Pertinent positive hemoglobin 9.5, creatinine 1.13 and albumin 2.7.  Lactic  acid normal limits x-rays bilateral lower extremity negative for osteomyelitis, fracture, dislocation or gas in the tissues. Patient was given 1 dose of IV vancomycin, IV Zosyn and morphine 4 mg in the ED.  Review of Systems: As per HPI otherwise 10 point review of systems negative.    Past Medical History:  Diagnosis Date  . Arthritis   . Hypoglycemia   . Pneumonia, pneumococcal ([facility])    .  Past Surgical History:  Procedure Laterality Date  . ABDOMINAL HYSTERECTOMY    . CESAREAN SECTION       reports that she has been smoking. She has been smoking about 0.50 packs per day. She has never used smokeless tobacco. She reports that she does not drink alcohol or use drugs.  Allergies  Allergen Reactions  . Codeine Other (See Comments)    "Gets high"  . Tylenol [Acetaminophen] Hives    No family history on file. Family history reviewed and negative for heart disease and cellulitis.  Prior to Admission medications   Medication Sig Start Date End Date Taking? Authorizing Provider  DULoxetine HCl 40 MG CPEP Take 40 mg by mouth daily. 06/29/19  Yes Caroline Buddy, MD  Hydrocortisone (GERHARDT'S BUTT CREAM) CREA Apply 1 application topically 2 (two) times daily. 06/28/19  Yes Melene Plan, MD  metoprolol tartrate (LOPRESSOR) 25 MG tablet Take 0.5 tablets (12.5 mg total) by mouth 2 (two) times daily. 06/29/19 08/12/19 Yes Caroline Buddy, MD  naproxen sodium (ALEVE) 220 MG tablet Take 220 mg by mouth daily as needed (pain).   Yes [provider]  ferrous sulfate 325 (65 FE) MG tablet Take 1 tablet (325 mg  total) by mouth daily with breakfast. Patient not taking: Reported on 06/18/2019 05/23/19   Caroline Marble P, DO  gabapentin (NEURONTIN) 300 MG capsule Take 2 capsules (600 mg total) by mouth 3 (three) times daily. 06/29/19 07/29/19  Caroline Dawn, MD  hydrOXYzine (ATARAX/VISTARIL) 25 MG tablet Take 1 tablet (25 mg total) by mouth 3 (three) times daily as needed for itching,  anxiety, nausea or vomiting (Leg pain - likely related to itching with unna boot). Patient not taking: Reported on 08/12/2019 06/29/19   Caroline Dawn, MD  nutrition supplement, JUVEN, [facility]) PACK Take 1 packet by mouth 2 (two) times daily between meals. Patient not taking: Reported on 08/12/2019 06/28/19   Caroline Oliphant, MD  traZODone (DESYREL) 50 MG tablet Take 1 tablet (50 mg total) by mouth at bedtime. Patient not taking: Reported on 06/18/2019 05/22/19   Caroline Marble P, DO  furosemide (LASIX) 20 MG tablet Take 0.5 tablets (10 mg total) by mouth daily. Patient not taking: Reported on 04/18/2018 12/10/17 05/04/19  Caroline Mail, PA-C    Physical Exam: Vitals:   08/12/19 1953 08/12/19 2030 08/12/19 2137 08/12/19 2138  BP: 109/62 (!) 109/55 134/63   Pulse: (!) 113 (!) 116 (!) 133 (!) 107  Resp: 18  18   Temp:      TempSrc:      SpO2: 94% (!) 87% 91%     Constitutional: NAD, calm, comfortable Vitals:   08/12/19 1953 08/12/19 2030 08/12/19 2137 08/12/19 2138  BP: 109/62 (!) 109/55 134/63   Pulse: (!) 113 (!) 116 (!) 133 (!) 107  Resp: 18  18   Temp:      TempSrc:      SpO2: 94% (!) 87% 91%   Constitutional: General: Patient is a 69 year old Caucasian female in mild distress due to discomfort in bilateral lower extremities Eyes: PERRL, lids and conjunctivae normal ENMT: Mucous membranes are moist. Posterior pharynx clear of any exudate or lesions.Normal dentition.  Neck: normal, supple, no masses, no thyromegaly Respiratory: clear to auscultation bilaterally, no wheezing, no crackles. Normal respiratory effort. No accessory muscle use.  Cardiovascular: Regular  rhythm with tachycardia, no murmurs / rubs / gallops. No extremity edema. 2+ pedal pulses. No carotid bruits.  Abdomen: no tenderness, no masses palpated. No hepatosplenomegaly. Bowel sounds positive.  Musculoskeletal: Patient has swelling, erythema and tenderness in bilateral lower extremities due to worsening wounds.   Pulses intact in bilateral lower extremities with normal muscular strength and sensations. Skin: Erythema and rash with purulent wounds lateral lower extremities Neurologic: CN 2-12 grossly intact. Sensation intact, DTR normal. Strength 5/5 in all 4.  Psychiatric: Normal judgment and insight. Alert and oriented x 3. Normal mood.    Labs on Admission: I have personally reviewed following labs and imaging studies  CBC: Recent Labs  Lab 08/12/19 1524  WBC 4.8  NEUTROABS 3.5  HGB 9.5*  HCT 32.3*  MCV 92.8  PLT 443   Basic Metabolic Panel: Recent Labs  Lab 08/12/19 1524  NA 137  K 4.7  CL 102  CO2 26  GLUCOSE 117*  BUN 26*  CREATININE 1.13*  CALCIUM 9.1   GFR: CrCl cannot be calculated (Unknown ideal weight.). Liver Function Tests: Recent Labs  Lab 08/12/19 1524  AST 23  ALT 18  ALKPHOS 80  BILITOT 0.5  PROT 8.3*  ALBUMIN 2.7*   No results for input(s): LIPASE, AMYLASE in the last 168 hours. No results for input(s): AMMONIA in the last 168 hours. Coagulation Profile: No results  for input(s): INR, PROTIME in the last 168 hours. Cardiac Enzymes: No results for input(s): CKTOTAL, CKMB, CKMBINDEX, TROPONINI in the last 168 hours. BNP (last 3 results) No results for input(s): PROBNP in the last 8760 hours. HbA1C: No results for input(s): HGBA1C in the last 72 hours. CBG: No results for input(s): GLUCAP in the last 168 hours. Lipid Profile: No results for input(s): CHOL, HDL, LDLCALC, TRIG, CHOLHDL, LDLDIRECT in the last 72 hours. Thyroid Function Tests: No results for input(s): TSH, T4TOTAL, FREET4, T3FREE, THYROIDAB in the last 72 hours. Anemia Panel: No results for input(s): VITAMINB12, FOLATE, FERRITIN, TIBC, IRON, RETICCTPCT in the last 72 hours. Urine analysis:    Component Value Date/Time   COLORURINE YELLOW 05/03/2019 2333   APPEARANCEUR TURBID (A) 05/03/2019 2333   LABSPEC 1.031 (H) 05/03/2019 2333   PHURINE 5.0 05/03/2019 2333   GLUCOSEU NEGATIVE  05/03/2019 2333   HGBUR NEGATIVE 05/03/2019 2333   BILIRUBINUR NEGATIVE 05/03/2019 2333   KETONESUR NEGATIVE 05/03/2019 2333   PROTEINUR 100 (A) 05/03/2019 2333   NITRITE POSITIVE (A) 05/03/2019 2333   LEUKOCYTESUR TRACE (A) 05/03/2019 2333    Radiological Exams on Admission: DG Tibia/Fibula Left  Result Date: 08/12/2019 CLINICAL DATA:  Cellulitis. EXAM: LEFT TIBIA AND FIBULA - 2 VIEW COMPARISON:  Left tibia and fibular radiographs 06/20/2019 FINDINGS: Subcutaneous edema is present. There is no subcutaneous gas. Degenerative changes are noted in the knee, more prominent medial. This is stable. No acute or focal osseous abnormality is present otherwise. IMPRESSION: 1. Diffuse subcutaneous edema without evidence for subcutaneous gas. 2. No acute or focal osseous abnormality. 3. Stable degenerative changes in the knee. Electronically Signed   By: Marin Robertshristopher  Mattern M.D.   On: 08/12/2019 16:02   DG Tibia/Fibula Right  Result Date: 08/12/2019 CLINICAL DATA:  Cellulitis EXAM: RIGHT TIBIA AND FIBULA - 2 VIEW COMPARISON:  None. FINDINGS: No acute fracture or dislocation. Generalized osteopenia. No soft tissue emphysema. Generalized soft tissue swelling. No radiopaque foreign body. IMPRESSION: No acute osseous injury of the right tibia or fibula. Electronically Signed   By: Elige KoHetal  Patel   On: 08/12/2019 16:02    EKG: Patient is having tachycardia also EKG ordered  Assessment/Plan Principal Problem:   Lower extremity cellulitis Patient was given 1 dose of IV vancomycin and IV Zosyn in the ED. Continue IV vancomycin and change IV Zosyn to IV ceftriaxone and metronidazole. Advil for mild pain as needed while morphine IV as needed for moderate to severe pain ordered. Wound nurse consultation ordered.  Active Problems:    Anemia Patient has a history of iron deficiency anemia.  Hemoglobin is 9.5. Continue to monitor CBC. Consider blood transfusion if hemoglobin drop below 7. Continue home  iron supplementation    Elevated serum creatinine Creatinine is mildly elevated to 1.13 whereas baseline creatinine is closer to 1.0 Continue to monitor.  Atrial fibrillation Patient has a history of chronic atrial fibrillation that is stable. Patient is not on any anticoagulation for unknown reasons. Continue home metoprolol.  Neuropathy Continue home Neurontin and duloxetine  DVT prophylaxis: Heparin  Code Status: Full code Family Communication: Patient states that she has no family and lives alone by herself. Disposition Plan: Patient will most probably stay for at least 2 nights and will be discharged to home. Consults called: Wound care Admission status: Inpatient   Thalia PartyMohammad Z Phinneas Shakoor MD [facility]  If 7PM-7AM, please contact night-coverage [email or web address] 08/12/2019, 9:48 PM

## 2019-08-12 NOTE — ED Provider Notes (Signed)
[facility] DEPT Provider Note   CSN: [id number] Arrival date & time: 08/12/19  1252     History Chief Complaint  Patient presents with  . leg infection-sent by Wound Center    [name] is a 68 y.o. female.  The history is provided by the patient and medical records. No language interpreter was used.  Wound Check This is a chronic problem. The current episode started more than 1 week ago. The problem occurs constantly. The problem has been rapidly worsening. Pertinent negatives include no chest pain, no abdominal pain, no headaches and no shortness of breath. Nothing aggravates the symptoms. Nothing relieves the symptoms. She has tried nothing for the symptoms. The treatment provided no relief.       Past Medical History:  Diagnosis Date  . Arthritis   . Hypoglycemia   . Pneumonia, pneumococcal [facility])     Patient Active Problem List   Diagnosis Date Noted  . Depression, major, recurrent, mild ([place]) 06/21/2019  . Pressure injury of skin 06/19/2019  . Cellulitis of left lower extremity   . Elevated C-reactive protein (CRP)   . Cellulitis of right lower extremity 06/18/2019  . Leg ulcer ([place])   . Adjustment disorder with mixed disturbance of emotions and conduct   . Type 2 diabetes mellitus with hyperglycemia ([place]) 05/04/2019  . AKI (acute kidney injury) ([place])   . Infected ulcer of skin ([place])   . High risk social situation   . Post-polio syndrome   . Chronic pain syndrome   . Limited mobility 04/20/2019  . Venous stasis ulcer ([place]) 03/31/2019  . Overgrown toenails 10/28/2018  . Wound of right leg 02/06/2018  . Atrial fibrillation ([place]) 01/03/2018  . Anemia 01/03/2018  . Elevated serum creatinine 01/03/2018  . Bipolar I disorder with mania ([place]) 01/03/2018    Past Surgical History:  Procedure Laterality Date  . ABDOMINAL HYSTERECTOMY    . CESAREAN SECTION       OB History    Gravida      Para      Term      Preterm     AB      Living  3     SAB      TAB      Ectopic      Multiple      Live Births              No family history on file.  Social History   Tobacco Use  . Smoking status: Heavy Tobacco Smoker    Packs/day: 0.50  . Smokeless tobacco: Never Used  Substance Use Topics  . Alcohol use: No  . Drug use: No    Home Medications Prior to Admission medications   Medication Sig Start Date End Date Taking? Authorizing Provider  DULoxetine HCl 40 MG CPEP Take 40 mg by mouth daily. 06/29/19   Guadalupe Dawn, MD  ferrous sulfate 325 (65 FE) MG tablet Take 1 tablet (325 mg total) by mouth daily with breakfast. Patient not taking: Reported on 06/18/2019 05/23/19   Mina Marble P, DO  gabapentin (NEURONTIN) 300 MG capsule Take 2 capsules (600 mg total) by mouth 3 (three) times daily. 06/29/19 07/29/19  Guadalupe Dawn, MD  Hydrocortisone (GERHARDT'S BUTT CREAM) CREA Apply 1 application topically 2 (two) times daily. 06/28/19   Wilber Oliphant, MD  hydrOXYzine (ATARAX/VISTARIL) 25 MG tablet Take 1 tablet (25 mg total) by mouth 3 (three) times daily as needed for itching,  anxiety, nausea or vomiting (Leg pain - likely related to itching with unna boot). 06/29/19   Myrene Buddy, MD  ibuprofen (ADVIL) 200 MG tablet Take 400 mg by mouth every 6 (six) hours as needed for moderate pain.    [provider]  metoprolol tartrate (LOPRESSOR) 25 MG tablet Take 0.5 tablets (12.5 mg total) by mouth 2 (two) times daily. 06/29/19 07/29/19  Myrene Buddy, MD  naproxen sodium (ALEVE) 220 MG tablet Take 220 mg by mouth daily as needed (pain).    [provider]  nutrition supplement, JUVEN, (JUVEN) PACK Take 1 packet by mouth 2 (two) times daily between meals. 06/28/19   Melene Plan, MD  traZODone (DESYREL) 50 MG tablet Take 1 tablet (50 mg total) by mouth at bedtime. Patient not taking: Reported on 06/18/2019 05/22/19   Orpah Cobb P, DO  furosemide (LASIX) 20 MG tablet Take 0.5  tablets (10 mg total) by mouth daily. Patient not taking: Reported on 04/18/2018 12/10/17 05/04/19  Arthor Captain, PA-C    Allergies    Codeine and Tylenol [acetaminophen]  Review of Systems   Review of Systems  Constitutional: Negative for chills, diaphoresis, fatigue and fever.  HENT: Negative for congestion.   Respiratory: Negative for cough, chest tightness, shortness of breath, wheezing and stridor.   Cardiovascular: Negative for chest pain and palpitations.  Gastrointestinal: Negative for abdominal pain, diarrhea, nausea and vomiting.  Genitourinary: Negative for dysuria and flank pain.  Musculoskeletal: Negative for back pain, neck pain and neck stiffness.  Skin: Positive for rash and wound.  Neurological: Negative for dizziness, light-headedness, numbness and headaches.  Psychiatric/Behavioral: Negative for agitation.  All other systems reviewed and are negative.   Physical Exam Updated Vital Signs BP 123/74 (BP Location: Left Arm)   Pulse 86   Temp 98.5 F (36.9 C) (Oral)   Resp 20   SpO2 95%   Physical Exam Vitals and nursing note reviewed.  Constitutional:      General: She is not in acute distress.    Appearance: She is well-developed. She is not ill-appearing, toxic-appearing or diaphoretic.  HENT:     Head: Normocephalic and atraumatic.     Right Ear: External ear normal.     Left Ear: External ear normal.  Eyes:     Conjunctiva/sclera: Conjunctivae normal.     Pupils: Pupils are equal, round, and reactive to light.  Cardiovascular:     Rate and Rhythm: Regular rhythm. Tachycardia present.     Pulses: Normal pulses.     Heart sounds: No murmur.  Pulmonary:     Effort: No respiratory distress.     Breath sounds: No stridor. No wheezing, rhonchi or rales.  Chest:     Chest wall: No tenderness.  Abdominal:     General: Abdomen is flat. There is no distension.     Tenderness: There is no abdominal tenderness. There is no right CVA tenderness, left CVA  tenderness or rebound.  Musculoskeletal:        General: Tenderness present.     Cervical back: Normal range of motion and neck supple. No tenderness.     Right lower leg: Edema present.     Left lower leg: Edema present.     Comments: See for clinical photo.  Patient has wounds on both legs with erythema swelling and tenderness.  The erythema on the right spreads proximally towards the knee.  She does have palpable DP pulses bilaterally and normal sensation and strength in the toes.  Skin:    General: Skin is warm.     Capillary Refill: Capillary refill takes less than 2 seconds.     Coloration: Skin is not pale.     Findings: Erythema and rash present.  Neurological:     Mental Status: She is alert and oriented to person, place, and time.     Sensory: No sensory deficit.     Motor: No weakness or abnormal muscle tone.     Deep Tendon Reflexes: Reflexes are normal and symmetric.  Psychiatric:        Mood and Affect: Mood normal.         ED Results / Procedures / Treatments   Labs (all labs ordered are listed, but only abnormal results are displayed) Labs Reviewed  CBC WITH DIFFERENTIAL/PLATELET - Abnormal; Notable for the following components:      Result Value   RBC 3.48 (*)    Hemoglobin 9.5 (*)    HCT 32.3 (*)    MCHC 29.4 (*)    RDW 17.2 (*)    All other components within normal limits  COMPREHENSIVE METABOLIC PANEL - Abnormal; Notable for the following components:   Glucose, Bld 117 (*)    BUN 26 (*)    Creatinine, Ser 1.13 (*)    Total Protein 8.3 (*)    Albumin 2.7 (*)    GFR calc non Af Amer 50 (*)    GFR calc Af Amer 58 (*)    All other components within normal limits  CULTURE, BLOOD (ROUTINE X 2)  CULTURE, BLOOD (ROUTINE X 2)  SARS CORONAVIRUS 2 (TAT 6-24 HRS)  LACTIC ACID, PLASMA  LACTIC ACID, PLASMA    EKG None  Radiology DG Tibia/Fibula Left  Result Date: 08/12/2019 CLINICAL DATA:  Cellulitis. EXAM: LEFT TIBIA AND FIBULA - 2 VIEW COMPARISON:   Left tibia and fibular radiographs 06/20/2019 FINDINGS: Subcutaneous edema is present. There is no subcutaneous gas. Degenerative changes are noted in the knee, more prominent medial. This is stable. No acute or focal osseous abnormality is present otherwise. IMPRESSION: 1. Diffuse subcutaneous edema without evidence for subcutaneous gas. 2. No acute or focal osseous abnormality. 3. Stable degenerative changes in the knee. Electronically Signed   By: Marin Robertshristopher  Mattern M.D.   On: 08/12/2019 16:02   DG Tibia/Fibula Right  Result Date: 08/12/2019 CLINICAL DATA:  Cellulitis EXAM: RIGHT TIBIA AND FIBULA - 2 VIEW COMPARISON:  None. FINDINGS: No acute fracture or dislocation. Generalized osteopenia. No soft tissue emphysema. Generalized soft tissue swelling. No radiopaque foreign body. IMPRESSION: No acute osseous injury of the right tibia or fibula. Electronically Signed   By: Elige KoHetal  Patel   On: 08/12/2019 16:02    Procedures Procedures (including critical care time)  CRITICAL CARE Performed by: Canary Brimhristopher J Erven Ramson Total critical care time: 35 minutes Critical care time was exclusive of separately billable procedures and treating other patients. Critical care was necessary to treat or prevent imminent or life-threatening deterioration. Critical care was time spent personally by me on the following activities: development of treatment plan with patient and/or surrogate as well as nursing, discussions with consultants, evaluation of patient's response to treatment, examination of patient, obtaining history from patient or surrogate, ordering and performing treatments and interventions, ordering and review of laboratory studies, ordering and review of radiographic studies, pulse oximetry and re-evaluation of patient's condition.   Medications Ordered in ED Medications  piperacillin-tazobactam (ZOSYN) IVPB 3.375 g (has no administration in time range)  vancomycin (VANCOCIN) IVPB 1000 mg/200 mL  premix (has no administration in time range)  morphine 4 MG/ML injection 4 mg (has no administration in time range)    ED Course  I have reviewed the triage vital signs and the nursing notes.  Pertinent labs & imaging results that were available during my care of the patient were reviewed by me and considered in my medical decision making (see chart for details).    MDM Rules/Calculators/A&P                      KATERINA ZURN is a 68 y.o. female with a past medical history significant for atrial fibrillation, diabetes per chart although patient denies, bipolar disorder, and chronic wounds on her legs who presents from the wound care center for IV antibiotics and admission for worsening cellulitis of the legs.  Patient reports that she has had leg wounds on both legs for months and has been treating it at home.  She reports that she was on antibiotics for the lower extremity infection and was seen by the wound care team today.  They saw her and immediately felt she needed to be transferred and admitted for IV antibiotics as the cellulitis has now crept up towards her knee worse on the right than the left.  Patient reports her pain is 10 out of 10 in severity.  She has had no new injuries.  There is a foul smell and purulence in the legs.  Patient has tenderness more proximal from the wound edge.  Patient has no fevers, chills, cough, shortness of breath or chest pain.  She has no nausea vomiting or urinary symptoms.  She does not think it is systemic yet.  Patient reports that if her wounds no get better, she would rather the leg to be removed.  On exam, patient does have foul-smelling wounds on her right leg worse than left.  She does have palpable DP pulses bilaterally.  She does have sensation in both of her legs and strength in her toes.  Patient's lungs were clear and chest abdomen were nontender.  No tenderness in the actual knee or the hip.  X-rays will be obtained to look for subcutaneous  gas which might suggest necrotizing fasciitis.  Will get labs.  We will go ahead and start the patient on antibiotics through IV.  Patient given pain medicine.  Anticipate admission after work-up is completed for IV antibiotics for her cellulitis.       4:32 PM X-ray shows no subcutaneous gas.  No evidence of osteomyelitis on imaging.  When labs are completed, patient be admitted for cellulitis.  5:05 PM Initial lactic acid is not elevated.  No leukocytosis.  Similar anemia to prior.  Covid test is in process.  Will call for admission for cellulitis of the legs.   Final Clinical Impression(s) / ED Diagnoses Final diagnoses:  Cellulitis of lower extremity, unspecified laterality    Rx / DC Orders ED Discharge Orders    None      Clinical Impression: 1. Cellulitis of lower extremity, unspecified laterality     Disposition: Admit  This note was prepared with assistance of Dragon voice recognition software. Occasional wrong-word or sound-a-like substitutions may have occurred due to the inherent limitations of voice recognition software.     Lehman Whiteley, Canary Brim, MD 08/12/19 1740

## 2019-08-12 NOTE — ED Notes (Signed)
Pt transported to xray 

## 2019-08-12 NOTE — ED Notes (Signed)
Pt voided in bed. Bedding was changed and new brief placed on pt. As well as purewick education

## 2019-08-12 NOTE — ED Notes (Signed)
[place] MD at bedside  

## 2019-08-12 NOTE — Progress Notes (Signed)
A consult was received from an ED physician for vancomycin per pharmacy dosing.  The patient's profile has been reviewed for ht/wt/allergies/indication/available labs.    Wt = 77 kg on 06/28/19. No weight entered for this admission.  A one time order has been placed for vancomycin 1500 mg IV once.  Further antibiotics/pharmacy consults should be ordered by admitting physician if indicated.                      Thank you, Lenis Noon, PharmD 08/12/2019  4:11 PM

## 2019-08-12 NOTE — Progress Notes (Signed)
Pharmacy Antibiotic Note  [name] is a 68 y.o. female admitted on 08/12/2019 with cellulitis.  Pharmacy has been consulted for cefepime + vancomycin dosing.  Pt has PMH significant for DM, chronic leg wounds who presents to the ED from wound care center for IV antibiotics due to worsening cellulitis. Purulence noted in MD note. Broad spectrum antibiotics being initiated. Pt recently filled prescription for SMX/TMP on 07/29/19.   Today, 08/12/19  WBC - WNL  SCr 1.13, CrCl ~58 mL/min  Lactate 1.6  Afebrile  Used wt = 77.7 kg charted on 06/28/19. Ht/wt order entered for this admission.  Plan:  Cefepime 2 g IV q12h  Metronidazole 500 mg IV q8h per MD  Vancomycin 1500 mg LD followed by 1000 mg IV q24h  Goal vancomycin AUC 400-550  Monitor renal function, culture date. Check vancomycin levels at steady state if indicated    Temp (24hrs), Avg:98.5 F (36.9 C), Min:98.5 F (36.9 C), Max:98.5 F (36.9 C)  Recent Labs  Lab 08/12/19 1524 08/12/19 1550  WBC 4.8  --   CREATININE 1.13*  --   LATICACIDVEN  --  1.6    CrCl cannot be calculated (Unknown ideal weight.).    Allergies  Allergen Reactions  . Codeine Other (See Comments)    "Gets high"  . Tylenol [Acetaminophen] Hives    Antimicrobials this admission: vancomycin 12/16 >>  cefepime 12/16 >>  Metronidazole 12/16 >> Pip/tazo x 1 in ED on 12/16  Dose adjustments this admission:  Microbiology results: 12/16 BCx: Sent 12/16 SARS-2: Sent  Thank you for allowing pharmacy to be a part of this patient's care.  Lenis Noon, PharmD 08/12/2019 6:21 PM

## 2019-08-12 NOTE — ED Triage Notes (Signed)
Pt sent from [facility] for cellulitis on legs and wounds. Pt is on antibiotics currently for wound infections. Was recently admitted to [facility].

## 2019-08-13 DIAGNOSIS — L03115 Cellulitis of right lower limb: Principal | ICD-10-CM

## 2019-08-13 DIAGNOSIS — I83029 Varicose veins of left lower extremity with ulcer of unspecified site: Secondary | ICD-10-CM

## 2019-08-13 DIAGNOSIS — E43 Unspecified severe protein-calorie malnutrition: Secondary | ICD-10-CM

## 2019-08-13 DIAGNOSIS — L97919 Non-pressure chronic ulcer of unspecified part of right lower leg with unspecified severity: Secondary | ICD-10-CM

## 2019-08-13 DIAGNOSIS — L03119 Cellulitis of unspecified part of limb: Secondary | ICD-10-CM

## 2019-08-13 DIAGNOSIS — L97929 Non-pressure chronic ulcer of unspecified part of left lower leg with unspecified severity: Secondary | ICD-10-CM

## 2019-08-13 DIAGNOSIS — I83019 Varicose veins of right lower extremity with ulcer of unspecified site: Secondary | ICD-10-CM

## 2019-08-13 LAB — BASIC METABOLIC PANEL
Anion gap: 10 (ref 5–15)
BUN: 22 mg/dL (ref 8–23)
CO2: 24 mmol/L (ref 22–32)
Calcium: 8.4 mg/dL — ABNORMAL LOW (ref 8.9–10.3)
Chloride: 104 mmol/L (ref 98–111)
Creatinine, Ser: 1.06 mg/dL — ABNORMAL HIGH (ref 0.44–1.00)
GFR calc Af Amer: >60 mL/min (ref >60)
GFR calc non Af Amer: 54 mL/min — ABNORMAL LOW (ref >60)
Glucose, Bld: 83 mg/dL (ref 70–99)
Potassium: 3.9 mmol/L (ref 3.5–5.1)
Sodium: 138 mmol/L (ref 135–145)

## 2019-08-13 LAB — CBC
HCT: 27.3 % — ABNORMAL LOW (ref 36.0–46.0)
Hemoglobin: 7.9 g/dL — ABNORMAL LOW (ref 12.0–15.0)
MCH: 27.1 pg (ref 26.0–34.0)
MCHC: 28.9 g/dL — ABNORMAL LOW (ref 30.0–36.0)
MCV: 93.5 fL (ref 80.0–100.0)
Platelets: 279 10*3/uL (ref 150–400)
RBC: 2.92 MIL/uL — ABNORMAL LOW (ref 3.87–5.11)
RDW: 17.3 % — ABNORMAL HIGH (ref 11.5–15.5)
WBC: 5.1 10*3/uL (ref 4.0–10.5)
nRBC: 0 % (ref 0.0–0.2)

## 2019-08-13 LAB — MRSA PCR SCREENING: MRSA by PCR: NEGATIVE

## 2019-08-13 MED ORDER — PIPERACILLIN-TAZOBACTAM 3.375 G IVPB
3.3750 g | Freq: Three times a day (TID) | INTRAVENOUS | Status: DC
  Administered 2019-08-13 – 2019-08-14 (×3): 3.375 g via INTRAVENOUS
  Filled 2019-08-13 (×4): qty 50

## 2019-08-13 NOTE — Consult Note (Addendum)
ORTHOPAEDIC CONSULTATION  REQUESTING PHYSICIAN: Burnadette PopAdhikari, Amrit, MD  Chief Complaint: Cellulitis with chronic venous stasis ulceration both lower extremities.  HPI: [name] is a 68 y.o. female who presents with chronic venous stasis ulcers bilateral lower extremities currently being treated at the wound center.  Patient presents at this time secondary to cellulitis with acute infection.  Patient states that she is a daily smoker.  She denies a history of diabetes states that she is taking a protein supplement for her protein caloric malnutrition.  Past Medical History:  Diagnosis Date  . Arthritis   . Hypoglycemia   . Pneumonia, pneumococcal [facility](HCC)    Past Surgical History:  Procedure Laterality Date  . ABDOMINAL HYSTERECTOMY    . CESAREAN SECTION     Social History   Socioeconomic History  . Marital status: Single    Spouse name: Not on file  . Number of children: Not on file  . Years of education: Not on file  . Highest education level: Not on file  Occupational History  . Not on file  Tobacco Use  . Smoking status: Heavy Tobacco Smoker    Packs/day: 0.50  . Smokeless tobacco: Never Used  Substance and Sexual Activity  . Alcohol use: No  . Drug use: No  . Sexual activity: Not on file  Other Topics Concern  . Not on file  Social History Narrative  . Not on file   Social Determinants of Health   Financial Resource Strain:   . Difficulty of Paying Living Expenses: Not on file  Food Insecurity: Food Insecurity Present  . Worried About Programme researcher, broadcasting/film/videounning Out of Food in the Last Year: Sometimes true  . Ran Out of Food in the Last Year: Sometimes true  Transportation Needs:   . Lack of Transportation (Medical): Not asked  . Lack of Transportation (Non-Medical): Not on file  Physical Activity:   . Days of Exercise per Week: Not on file  . Minutes of Exercise per Session: Not on file  Stress:   . Feeling of Stress : Not on file  Social Connections:   . Frequency  of Communication with Friends and Family: Not on file  . Frequency of Social Gatherings with Friends and Family: Not on file  . Attends Religious Services: Not on file  . Active Member of Clubs or Organizations: Not on file  . Attends BankerClub or Organization Meetings: Not on file  . Marital Status: Not on file   No family history on file. - negative except otherwise stated in the family history section Allergies  Allergen Reactions  . Codeine Other (See Comments)    "Gets high"  . Tylenol [Acetaminophen] Hives   Prior to Admission medications   Medication Sig Start Date End Date Taking? Authorizing Provider  DULoxetine HCl 40 MG CPEP Take 40 mg by mouth daily. 06/29/19  Yes Myrene BuddyFletcher, Jacob, MD  Hydrocortisone (GERHARDT'S BUTT CREAM) CREA Apply 1 application topically 2 (two) times daily. 06/28/19  Yes Melene PlanKim, Rachel E, MD  metoprolol tartrate (LOPRESSOR) 25 MG tablet Take 0.5 tablets (12.5 mg total) by mouth 2 (two) times daily. 06/29/19 08/12/19 Yes Myrene BuddyFletcher, Jacob, MD  naproxen sodium (ALEVE) 220 MG tablet Take 220 mg by mouth daily as needed (pain).   Yes [provider]  ferrous sulfate 325 (65 FE) MG tablet Take 1 tablet (325 mg total) by mouth daily with breakfast. Patient not taking: Reported on 06/18/2019 05/23/19   Orpah CobbMullis, Kiersten P, DO  gabapentin (NEURONTIN) 300 MG  capsule Take 2 capsules (600 mg total) by mouth 3 (three) times daily. 06/29/19 07/29/19  Myrene Buddy, MD  hydrOXYzine (ATARAX/VISTARIL) 25 MG tablet Take 1 tablet (25 mg total) by mouth 3 (three) times daily as needed for itching, anxiety, nausea or vomiting (Leg pain - likely related to itching with unna boot). Patient not taking: Reported on 08/12/2019 06/29/19   Myrene Buddy, MD  nutrition supplement, JUVEN, [facility]) PACK Take 1 packet by mouth 2 (two) times daily between meals. Patient not taking: Reported on 08/12/2019 06/28/19   Melene Plan, MD  traZODone (DESYREL) 50 MG tablet Take 1 tablet (50 mg total)  by mouth at bedtime. Patient not taking: Reported on 06/18/2019 05/22/19   Orpah Cobb P, DO  furosemide (LASIX) 20 MG tablet Take 0.5 tablets (10 mg total) by mouth daily. Patient not taking: Reported on 04/18/2018 12/10/17 05/04/19  Arthor Captain, PA-C   DG Tibia/Fibula Left  Result Date: 08/12/2019 CLINICAL DATA:  Cellulitis. EXAM: LEFT TIBIA AND FIBULA - 2 VIEW COMPARISON:  Left tibia and fibular radiographs 06/20/2019 FINDINGS: Subcutaneous edema is present. There is no subcutaneous gas. Degenerative changes are noted in the knee, more prominent medial. This is stable. No acute or focal osseous abnormality is present otherwise. IMPRESSION: 1. Diffuse subcutaneous edema without evidence for subcutaneous gas. 2. No acute or focal osseous abnormality. 3. Stable degenerative changes in the knee. Electronically Signed   By: Marin Roberts M.D.   On: 08/12/2019 16:02   DG Tibia/Fibula Right  Result Date: 08/12/2019 CLINICAL DATA:  Cellulitis EXAM: RIGHT TIBIA AND FIBULA - 2 VIEW COMPARISON:  None. FINDINGS: No acute fracture or dislocation. Generalized osteopenia. No soft tissue emphysema. Generalized soft tissue swelling. No radiopaque foreign body. IMPRESSION: No acute osseous injury of the right tibia or fibula. Electronically Signed   By: Elige Ko   On: 08/12/2019 16:02   - pertinent xrays, CT, MRI studies were reviewed and independently interpreted  Positive ROS: All other systems have been reviewed and were otherwise negative with the exception of those mentioned in the HPI and as above.  Physical Exam: General: Alert, no acute distress Psychiatric: Patient is competent for consent with normal mood and affect Lymphatic: No axillary or cervical lymphadenopathy Cardiovascular: No pedal edema Respiratory: No cyanosis, no use of accessory musculature GI: No organomegaly, abdomen is soft and non-tender    Images:  @ENCIMAGES @  Labs:  Lab Results  Component Value Date     HGBA1C 6.6 (H) 05/04/2019   ESRSEDRATE 129 (H) 06/18/2019   CRP 21.8 (H) 06/18/2019   CRP 16.2 (H) 05/05/2019   REPTSTATUS PENDING 08/12/2019   GRAMSTAIN  05/04/2019    NO WBC SEEN ABUNDANT GRAM NEGATIVE RODS ABUNDANT GRAM POSITIVE COCCI MODERATE GRAM POSITIVE RODS Performed at [facility], [address] [place], [place]    CULT  08/12/2019    NO GROWTH < 24 HOURS Performed at [facility], [address], [place], [place]    LABORGA ESCHERICHIA COLI (A) 05/03/2019    Lab Results  Component Value Date   ALBUMIN 2.7 (L) 08/12/2019   ALBUMIN 2.0 (L) 06/19/2019   ALBUMIN 2.6 (L) 06/18/2019    Neurologic: Patient does not have protective sensation bilateral lower extremities.   MUSCULOSKELETAL:   Skin: Examination patient has cellulitis of both legs with venous stasis ulcers with fibrinous exudative tissue.  She has massive ulceration of both legs.  Right leg worse than the left.  Patient has  a palpable dorsalis pedis pulse bilaterally without arterial disease.  She has an albumin that is ranged between 2.0 and 2.7 consistent with severe protein caloric malnutrition.  She does have a hemoglobin A1c of 6.6 though she denies diabetes.  Patient also has bilateral decubitus heel ulcers.  She states she does have protective boots that she wears at home.  Patient states that she lives alone at home and takes care of herself.  Assessment: Assessment: Chronic venous insufficiency ulceration bilateral lower extremities with bilateral heel decubitus ulcers with cellulitis of both legs with a history of tobacco use and severe protein caloric malnutrition with prediabetes.  Plan: Plan: Agree with IV antibiotics silver alginate dressing with Kerlix and an Ace wrap for compression.  Patient's heels are floated off the bed to protect her heel ulcers.  Once the cellulitis is resolved patient will need discharge to skilled nursing to continue wound care  around-the-clock.  No indications for surgical intervention.  Patient is too painful to use compression stockings at this time.  She can follow-up in the wound center or I could follow-up in my office which ever is her preference.  Thank you for the consult and the opportunity to see Ms. Geanie Kenning, [place] [phone] 5:12 PM

## 2019-08-13 NOTE — Progress Notes (Signed)
Caroline, Phillips ([id number]) Visit Report for 08/12/2019 Chief Complaint Document Details Patient Name: Date of Service: [name], [name] 08/12/2019 10:30 AM Medical Record [name]:[id number] Patient Account Number: [id number] Date of Birth/Sex: Treating RN: 07/31/1951 (68 y.o. Wynelle Link Primary Care Provider: Luis Phillips Other Clinician: Referring Provider: Treating Provider/Extender:[name] Weeks in Treatment: 0 Information Obtained from: Patient Chief Complaint Bilateral LE and bilateral heel pressure ulcers Electronic Signature(s) Signed: 08/12/2019 12:13:14 PM By: Lenda Kelp PA-C Entered By: Lenda Kelp on 08/12/2019 12:13:13 -------------------------------------------------------------------------------- HPI Details Patient Name: Date of Service: [name]. 08/12/2019 10:30 AM Medical Record [id number] Patient Account Number: [id number] Date of Birth/Sex: Treating RN: [birth year] (68 y.o. Wynelle Link Primary Care Provider: Luis Phillips Other Clinician: Referring Provider: Treating Provider/Extender:[name] Weeks in Treatment: 0 History of Present Illness HPI Description: ADMISSION 03/03/18 This is a 68 year old woman who is here for review of wounds on her bilateral lower legs which she states of been there for 8 months. She is not able to give a really great history about how they started simply thinks that they showed up 1 day. She apparently has a history of weeping bilateral lower extremity edema probably chronic venous insufficiency plus or minus lymphedema. She was seen on 02/05/18 at her primary physician in [facility] family practice noted to have a large wound on the right leg a smaller wound on the left. She was given Unna boots and 10 days of Keflex. Previous to this she had been in the ER on several occasions in February for review of these wounds. The patient is not a  diabetic however she does smoke. Past medical history includes atrial fibrillation, anemia, bipolar disorder, We were not able to get an ABI in our clinic today as the patient could not stand it. She comes into the clinic without anything on the wounds. She has advanced home care at home although I'm really not certain what they've been putting on this Readmission: 08/12/2019 on evaluation today patient actually presents for follow-up here in our clinic we have not seen her since actually last year which was July 8 of 2019. With that being said she is still having issues with bilateral lower extremity wounds. The right is definitely worse than the left but both are significant. She has pressure ulcers to both heels along with the open lymphedema/venous ulcers to her legs. Unfortunately she is having significant pain she also appears to have severe cellulitis in my opinion especially on the right which is extending all the way up to her knee and seems to be tracking such from the wounds down below. There is no signs of obvious systemic infection such as sepsis but nonetheless she has had this it seems like for quite some time. There definitely is some signs of dementia or at least not fully understanding exactly what is going on though I think the patient is able to tell me the truth about her pain I think she is in tremendous pain. She initially told us that she was not going to allow anybody to touch her. However we discussed with her that we did need to have a look at the wounds and we would have to do a few things in order to try to help her she was cooperative and actually did very well. She just had to be somewhat coached and what was going on and not just have something sprung on her. With that being said she is  also very much able to lift up her legs to let me look at all the areas she did that on her own and was very helpful in that regard. Electronic Signature(s) Signed: 08/12/2019  12:36:43 PM By: Worthy Keeler PA-C Entered By: Worthy Keeler on 08/12/2019 12:36:42 -------------------------------------------------------------------------------- Physical Exam Details Patient Name: Date of Service: Caroline, Phillips 08/12/2019 10:30 AM Medical Record [id number] Patient Account Number: [id number] Date of Birth/Sex: Treating RN: 10/23/1950 (68 y.o. [name] Primary Care Provider: [place] Phillips Other Clinician: Referring Provider: Treating Provider/Extender:Caroline III, Nancie Neas, Phillips Weeks in Treatment: 0 Constitutional sitting or standing blood pressure is within target range for patient.. pulse regular and within target range for patient.Marland Kitchen respirations regular, non-labored and within target range for patient.Marland Kitchen temperature within target range for patient.. Well-nourished and well-hydrated in no acute distress. Eyes conjunctiva clear no eyelid edema noted. pupils equal round and reactive to light and accommodation. Ears, Nose, Mouth, and Throat no gross abnormality of ear auricles or external auditory canals. normal hearing noted during conversation. mucus membranes moist. Respiratory normal breathing without difficulty. clear to auscultation bilaterally. Cardiovascular regular rate and rhythm with normal S1, S2. no clubbing, cyanosis, significant edema, <3 sec cap refill. Gastrointestinal (GI) soft, non-tender, non-distended, +BS. no ventral hernia noted. Musculoskeletal Patient unable to walk without assistance. no significant deformity or arthritic changes, no loss or range of motion, no clubbing. Psychiatric this patient is able to make decisions and demonstrates good insight into disease process. Alert and Oriented x 3. pleasant and cooperative. Notes Upon inspection today patient has significant wounds over the bilateral lower extremities on the right there is definitely muscle exposed and necrotic muscle at that. She has  significant cellulitis of both legs as well around the wounds and on the right extending up to the knee region which does have me concerned. The patient is in significant pain and tells me such several times during the visit today which I completely agree I think this is something that is indeed very much painful for her. She does have difficulty walking she tells me secondary to the pain. Electronic Signature(s) Signed: 08/12/2019 12:37:25 PM By: Worthy Keeler PA-C Entered By: Worthy Keeler on 08/12/2019 12:37:24 -------------------------------------------------------------------------------- Physician Orders Details Patient Name: Date of Service: [name]. 08/12/2019 10:30 AM Medical Record [id number] Patient Account Number: [id number] Date of Birth/Sex: Treating RN: 1950-09-29 (68 y.o. [name] Primary Care Provider: [place] Phillips Other Clinician: Referring Provider: Treating Provider/Extender:Caroline III, Nancie Neas, Phillips Weeks in Treatment: 0 Verbal / Phone Orders: No Diagnosis Coding ICD-10 Coding Code Description I89.0 Lymphedema, not elsewhere classified L97.822 Non-pressure chronic ulcer of other part of left lower leg with fat layer exposed L97.812 Non-pressure chronic ulcer of other part of right lower leg with fat layer exposed L89.620 Pressure ulcer of left heel, unstageable L89.610 Pressure ulcer of right heel, unstageable Follow-up Appointments Return Appointment in: - call to schedule appointment after discharge from the hospital Primary Wound Dressing Wound #3 Right Calcaneus Other: - ABD pads Wound #4 Left,Lateral Calcaneus Other: - ABD pads Wound #5 Right,Circumferential Lower Leg Other: - ABD pads Wound #6 Left,Circumferential Lower Leg Other: - ABD pads Secondary Dressing Wound #3 Right Calcaneus Kerlix/Rolled Gauze Wound #4 Left,Lateral Calcaneus Kerlix/Rolled Gauze Wound #5 Right,Circumferential Lower  Leg Kerlix/Rolled Gauze Wound #6 Left,Circumferential Lower Leg Kerlix/Rolled Gauze Electronic Signature(s) Signed: 08/12/2019 5:37:18 PM By: Worthy Keeler PA-C Signed: 08/13/2019 5:32:06 PM By: Levan Hurst RN, BSN Entered By: Levan Hurst on  08/12/2019 12:35:59 -------------------------------------------------------------------------------- Problem List Details Patient Name: Date of Service: JAMYAH, FOLK 08/12/2019 10:30 AM Medical Record [id number] Patient Account Number: [id number] Date of Birth/Sex: Treating RN: [birth year] (68 y.o. Wynelle Link Primary Care Provider: Luis Phillips Other Clinician: Referring Provider: Treating Provider/Extender:[name] Weeks in Treatment: 0 Active Problems ICD-10 Evaluated Encounter Code Description Active Date Today Diagnosis I89.0 Lymphedema, not elsewhere classified 08/12/2019 No Yes L97.822 Non-pressure chronic ulcer of other part of left lower 08/12/2019 No Yes leg with fat layer exposed L97.812 Non-pressure chronic ulcer of other part of right lower 08/12/2019 No Yes leg with fat layer exposed L89.620 Pressure ulcer of left heel, unstageable 08/12/2019 No Yes L89.610 Pressure ulcer of right heel, unstageable 08/12/2019 No Yes Inactive Problems Resolved Problems Electronic Signature(s) Signed: 08/12/2019 12:12:49 PM By: Lenda Kelp PA-C Entered By: Lenda Kelp on 08/12/2019 12:12:48 -------------------------------------------------------------------------------- Progress Note Details Patient Name: Date of Service: [name]. 08/12/2019 10:30 AM Medical Record [id number] Patient Account Number: [id number] Date of Birth/Sex: Treating RN: 04-18-1951 (68 y.o. Wynelle Link Primary Care Provider: Luis Phillips Other Clinician: Referring Provider: Treating Provider/Extender:[name] Weeks in Treatment: 0 Subjective Chief  Complaint Information obtained from Patient Bilateral LE and bilateral heel pressure ulcers History of Present Illness (HPI) ADMISSION 03/03/18 This is a 68 year old woman who is here for review of wounds on her bilateral lower legs which she states of been there for 8 months. She is not able to give a really great history about how they started simply thinks that they showed up 1 day. She apparently has a history of weeping bilateral lower extremity edema probably chronic venous insufficiency plus or minus lymphedema. She was seen on 02/05/18 at her primary physician in [facility] family practice noted to have a large wound on the right leg a smaller wound on the left. She was given Unna boots and 10 days of Keflex. Previous to this she had been in the ER on several occasions in February for review of these wounds. The patient is not a diabetic however she does smoke. Past medical history includes atrial fibrillation, anemia, bipolar disorder, We were not able to get an ABI in our clinic today as the patient could not stand it. She comes into the clinic without anything on the wounds. She has advanced home care at home although I'm really not certain what they've been putting on this Readmission: 08/12/2019 on evaluation today patient actually presents for follow-up here in our clinic we have not seen her since actually last year which was July 8 of 2019. With that being said she is still having issues with bilateral lower extremity wounds. The right is definitely worse than the left but both are significant. She has pressure ulcers to both heels along with the open lymphedema/venous ulcers to her legs. Unfortunately she is having significant pain she also appears to have severe cellulitis in my opinion especially on the right which is extending all the way up to her knee and seems to be tracking such from the wounds down below. There is no signs of obvious systemic infection such as sepsis but  nonetheless she has had this it seems like for quite some time. There definitely is some signs of dementia or at least not fully understanding exactly what is going on though I think the patient is able to tell me the truth about her pain I think she is in tremendous pain. She initially told us that she  was not going to allow anybody to touch her. However we discussed with her that we did need to have a look at the wounds and we would have to do a few things in order to try to help her she was cooperative and actually did very well. She just had to be somewhat coached and what was going on and not just have something sprung on her. With that being said she is also very much able to lift up her legs to let me look at all the areas she did that on her own and was very helpful in that regard. Patient History Unable to Obtain Patient History due to Altered Mental Status. Information obtained from Patient. Allergies codeine, Tylenol (Reaction: hives) Family History Unknown History. Social History Current every day smoker - started on 08/28/1995, Marital Status - Divorced, Alcohol Use - Never - denies etoh after stating use, Drug Use - Prior History, Caffeine Use - Daily - sodas all day. Medical History Hematologic/Lymphatic Patient has history of Anemia Cardiovascular Patient has history of Arrhythmia - atrial fib Denies history of Angina Hospitalization/Surgery History - c- section. - hysterectomy. 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 Eyes Denies complaints or symptoms of Dry Eyes, Vision Changes, Glasses / Contacts. Ear/Nose/Mouth/Throat Denies complaints or symptoms of Chronic  sinus problems or rhinitis. Respiratory Denies complaints or symptoms of Chronic or frequent coughs, Shortness of Breath. Cardiovascular Denies complaints or symptoms of Chest pain. Gastrointestinal Denies complaints or symptoms of Frequent diarrhea, Nausea, Vomiting. Endocrine Denies complaints or symptoms of Heat/cold intolerance. Genitourinary Denies complaints or symptoms of Frequent urination. Musculoskeletal Denies complaints or symptoms of Muscle Pain, Muscle Weakness. Neurologic Denies complaints or symptoms of Numbness/parasthesias. Objective Constitutional sitting or standing blood pressure is within target range for patient.. pulse regular and within target range for patient.Marland Kitchen. respirations regular, non-labored and within target range for patient.Marland Kitchen. temperature within target range for patient.. Well-nourished and well-hydrated in no acute distress. Vitals Time Taken: 11:23 AM, Height: 66 in, Source: Stated, Weight: 178 lbs, Source: Stated, BMI: 28.7, Temperature: 98.1 F, Pulse: 68 bpm, Respiratory Rate: 19 breaths/min, Blood Pressure: 121/52 mmHg. Eyes conjunctiva clear no eyelid edema noted. pupils equal round and reactive to light and accommodation. Ears, Nose, Mouth, and Throat no gross abnormality of ear auricles or external auditory canals. normal hearing noted during conversation. mucus membranes moist. Respiratory normal breathing without difficulty. clear to auscultation bilaterally. Cardiovascular regular rate and rhythm with normal S1, S2. no clubbing, cyanosis, significant edema, Gastrointestinal (GI) soft, non-tender, non-distended, +BS. no ventral hernia noted. Musculoskeletal Patient unable to walk without assistance. no significant deformity or arthritic changes, no loss or range of motion, no clubbing. Psychiatric this patient is able to make decisions and demonstrates good insight into disease process. Alert and Oriented x 3. pleasant and  cooperative. General Notes: Upon inspection today patient has significant wounds over the bilateral lower extremities on the right there is definitely muscle exposed and necrotic muscle at that. She has significant cellulitis of both legs as well around the wounds and on the right extending up to the knee region which does have me concerned. The patient is in significant pain and tells me such several times during the visit today which I completely agree I think this is something that is indeed very  much painful for her. She does have difficulty walking she tells me secondary to the pain. Integumentary (Hair, Skin) Wound #3 status is Open. Original cause of wound was Not Known. The wound is located on the Right Calcaneus. The wound measures 1.7cm length x 2.5cm width x 0.1cm depth; 3.338cm^2 area and 0.334cm^3 volume. There is no tunneling or undermining noted. There is a medium amount of purulent drainage noted. Foul odor after cleansing was noted. The wound margin is distinct with the outline attached to the wound base. There is small (1-33%) pink granulation within the wound bed. There is a large (67-100%) amount of necrotic tissue within the wound bed including Adherent Slough. Wound #4 status is Open. Original cause of wound was Not Known. The wound is located on the Left,Lateral Calcaneus. The wound measures 3cm length x 4cm width x 0.1cm depth; 9.425cm^2 area and 0.942cm^3 volume. There is no tunneling or undermining noted. There is a medium amount of purulent drainage noted. Foul odor after cleansing was noted. The wound margin is distinct with the outline attached to the wound base. There is small (1- 33%) pink granulation within the wound bed. There is a large (67-100%) amount of necrotic tissue within the wound bed including Eschar and Adherent Slough. Wound #5 status is Open. Original cause of wound was Blister. The wound is located on the Right,Circumferential Lower Leg. The wound  measures 27.5cm length x 39cm width x 0.2cm depth; 842.34cm^2 area and 168.468cm^3 volume. There is Fat Layer (Subcutaneous Tissue) Exposed exposed. There is no tunneling or undermining noted. There is a large amount of purulent drainage noted. Foul odor after cleansing was noted. The wound margin is distinct with the outline attached to the wound base. There is small (1-33%) pink granulation within the wound bed. There is a large (67-100%) amount of necrotic tissue within the wound bed including Adherent Slough. Wound #6 status is Open. Original cause of wound was Blister. The wound is located on the Left,Circumferential Lower Leg. The wound measures 23cm length x 33cm width x 0.2cm depth; 596.117cm^2 area and 119.223cm^3 volume. There is Fat Layer (Subcutaneous Tissue) Exposed exposed. There is no tunneling or undermining noted. There is a large amount of purulent drainage noted. Foul odor after cleansing was noted. The wound margin is distinct with the outline attached to the wound base. There is small (1-33%) pink granulation within the wound bed. There is a large (67-100%) amount of necrotic tissue within the wound bed including Adherent Slough. Assessment Active Problems ICD-10 Lymphedema, not elsewhere classified Non-pressure chronic ulcer of other part of left lower leg with fat layer exposed Non-pressure chronic ulcer of other part of right lower leg with fat layer exposed Pressure ulcer of left heel, unstageable Pressure ulcer of right heel, unstageable Plan Follow-up Appointments: Return Appointment in: - call to schedule appointment after discharge from the hospital Primary Wound Dressing: Wound #3 Right Calcaneus: Other: - ABD pads Wound #4 Left,Lateral Calcaneus: Other: - ABD pads Wound #5 Right,Circumferential Lower Leg: Other: - ABD pads Wound #6 Left,Circumferential Lower Leg: Other: - ABD pads Secondary Dressing: Wound #3 Right Calcaneus: Kerlix/Rolled Gauze Wound  #4 Left,Lateral Calcaneus: Kerlix/Rolled Gauze Wound #5 Right,Circumferential Lower Leg: Kerlix/Rolled Gauze Wound #6 Left,Circumferential Lower Leg: Kerlix/Rolled Gauze 1. Based on the severity of her symptoms at this point my suggestion is that we go ahead and have the patient go for evaluation and treatment at the emergency department. I suspect they will likely admit her to the hospital for further  evaluation and treatment including likely IV antibiotic therapy. I think this is appropriate based on the severe cellulitis I see. 2. With regard to her wounds obviously I think is can be quite difficult for her to ever really heal with regard to the extent of her wounds at this point. That is something that we will get a have to basically manage likely more in a palliative way in my opinion but nonetheless at this point I think the cellulitis is the major complication an issue. We will see the patient back for follow-up visit depending on the evaluation at the emergency department and where things go from there. Electronic Signature(s) Signed: 08/12/2019 12:38:22 PM By: Lenda Kelp PA-C Entered By: Lenda Kelp on 08/12/2019 12:38:22 -------------------------------------------------------------------------------- HxROS Details Patient Name: Date of Service: [name]. 08/12/2019 10:30 AM Medical Record [name]:[id number] Patient Account Number: [id number] Date of Birth/Sex: Treating RN: January 26, 1951 (68 y.o. Harvest Dark Primary Care Provider: Luis Phillips Other Clinician: Referring Provider: Treating Provider/Extender:[name] Weeks in Treatment: 0 Unable to Obtain Patient History due to Altered Mental Status Information Obtained From Patient Constitutional Symptoms (General Health) Complaints and Symptoms: Negative for: Fatigue; Fever; Chills; Marked Weight Change Eyes Complaints and Symptoms: Negative for: Dry Eyes; Vision  Changes; Glasses / Contacts Ear/Nose/Mouth/Throat Complaints and Symptoms: Negative for: Chronic sinus problems or rhinitis Respiratory Complaints and Symptoms: Negative for: Chronic or frequent coughs; Shortness of Breath Cardiovascular Complaints and Symptoms: Negative for: Chest pain Medical History: Positive for: Arrhythmia - atrial fib Negative for: Angina Past Medical History Notes: edema venous insufficiency cellulitis Gastrointestinal Complaints and Symptoms: Negative for: Frequent diarrhea; Nausea; Vomiting Endocrine Complaints and Symptoms: Negative for: Heat/cold intolerance Genitourinary Complaints and Symptoms: Negative for: Frequent urination Musculoskeletal Complaints and Symptoms: Negative for: Muscle Pain; Muscle Weakness Neurologic Complaints and Symptoms: Negative for: Numbness/parasthesias Hematologic/Lymphatic Medical History: Positive for: Anemia Immunological Integumentary (Skin) Medical History: Past Medical History Notes: bilateral legs cellulitis Oncologic Psychiatric Medical History: Past Medical History Notes: 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 None noted in system or in notes from ED Implantable Devices No devices added Hospitalization / Surgery History Type of Hospitalization/Surgery c- section hysterectomy Family and Social History Unknown History: Yes; Current every day smoker - started on 08/28/1995; Marital Status - Divorced; Alcohol Use: Never - denies etoh after stating use; Drug Use: Prior History; Caffeine Use: Daily - sodas all day; Financial Concerns: No; Food, Clothing or Shelter Needs: No; Support System Lacking: No; Transportation Concerns: No Electronic Signature(s) Signed: 08/12/2019 5:37:18 PM By: Lenda Kelp PA-C Signed: 08/13/2019  5:33:40 PM By: Cherylin Mylar Entered By: Cherylin Mylar on 08/12/2019 11:27:49 -------------------------------------------------------------------------------- SuperBill Details Patient Name: Date of Service: [name] 08/12/2019 Medical Record [id number] Patient Account Number: [id number] Date of Birth/Sex: Treating RN: [birth year] (68 y.o. Wynelle Link Primary Care Provider: Luis Phillips Other Clinician: Referring Provider: Treating Provider/Extender:[name] Weeks in Treatment: 0 Diagnosis Coding ICD-10 Codes Code Description I89.0 Lymphedema, not elsewhere classified L97.822 Non-pressure chronic ulcer of other part of left lower leg with fat layer exposed L97.812 Non-pressure chronic ulcer of other part of right lower leg with fat layer exposed L89.620 Pressure ulcer of left heel, unstageable L89.610 Pressure ulcer of right heel, unstageable Facility Procedures CPT4 Code: [id number] Description: 40102 - WOUND CARE VISIT-LEV 5 EST PT Modifier: Quantity: 1 Physician Procedures CPT4  Code Description: [id number] - WC PHYS LEVEL 4 - EST PT ICD-10 Diagnosis Description I89.0 Lymphedema, not elsewhere classified L97.822 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

## 2019-08-13 NOTE — Progress Notes (Signed)
Pharmacy Antibiotic Note  [name] is a 68 y.o. female admitted on 08/12/2019 with cellulitis.  Pharmacy has been consulted for zosyn dosing.  Plan: Zosyn 3.375g IV Q8H infused over 4hrs. Continue vanc 1000mg  IV q24h Daily Scr while on both vanc and zosyn   Height: 5\' 3"  (160 cm) Weight: 165 lb 5.5 oz (75 kg) IBW/kg (Calculated) : 52.4  Temp (24hrs), Avg:99.1 F (37.3 C), Min:98 F (36.7 C), Max:100.5 F (38.1 C)  Recent Labs  Lab 08/12/19 1524 08/12/19 1550 08/12/19 1955 08/13/19 0603  WBC 4.8  --   --  5.1  CREATININE 1.13*  --   --  1.06*  LATICACIDVEN  --  1.6 1.3  --     Estimated Creatinine Clearance: 49.2 mL/min (A) (by C-G formula based on SCr of 1.06 mg/dL (H)).    Allergies  Allergen Reactions  . Codeine Other (See Comments)    "Gets high"  . Tylenol [Acetaminophen] Hives    Antimicrobials this admission: vancomycin 12/16 >>  cefepime 12/16 >> 12/17 Metronidazole 12/16 >> 12/17 Zosyn 12/17 >>  Dose adjustments this admission:  Microbiology results: 12/16 BCx: Sent 12/16 SARS-2: negatvie Thank you for allowing pharmacy to be a part of this patient's care.  Dolly Rias RPh 08/13/2019, 1:41 PM

## 2019-08-13 NOTE — Progress Notes (Signed)
PROGRESS NOTE    [name]  QMV:[id number] DOB: 1951/03/30 DOA: 08/12/2019 PCP: [name], DO   Brief Narrative:  Patient is a 68 year old female with history of bilateral lower extremity chronic wounds, chronic A. fib not on anticoagulation, bipolar disorder, diabetes who was brought from [facility] assisted living facility for the evaluation of worsening cellulitis of lower extremity.  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  She also follows with wound care center.  She has history of purulent foul-smelling discharge from her wound.  On arrival she was hemodynamically stable.  She has been started on broad-spectrum antibiotics for her cellulitis.  Wound care consulted.  Orthopedics also consulted today.  Assessment & Plan:   Principal Problem:   Lower extremity cellulitis Active Problems:   Atrial fibrillation (HCC)   Anemia   Elevated serum creatinine   Bilateral lower extremity cellulitis: Has history of chronic bilateral lower extremity cellulitis.  Follows at wound care.  Wound care following here.  Recommend orthopedic consultation.  Continue broad-spectrum antibiotics.  Follow-up cultures.  Continue pain medicines.  Chronic normocytic anemia: Currently H&H stable.  Continue home iron supplementation.Continue to monitor CBC  Chronic atrial flutter: Not on anticoagulation.  Continue metoprolol.  History of neuropathy: Continue Neurontin, duloxetine  Bipolar disorder: Currently not on any medicines  Debility/deconditioning: Assisted living facility resident.  As per the significant other, she has been recently bedbound and unable to walk due to several factors like her lower extremity infection, neuropathy.  Will request a PT/OT evaluation              DVT prophylaxis: Heparin [place] Code Status: Full Family Communication: Significant other Disposition Plan: Back to assisted  living facility versus nursing facility when medically stable   Consultants: Orthopedics consulted, waiting to hear back  Procedures: None  Antimicrobials:  Anti-infectives (From admission, onward)   Start     Dose/Rate Route Frequency Ordered Stop   08/13/19 1700  vancomycin (VANCOCIN) IVPB 1000 mg/200 mL premix     1,000 mg 200 mL/hr over 60 Minutes Intravenous Every 24 hours 08/12/19 1833     08/12/19 2200  metroNIDAZOLE (FLAGYL) IVPB 500 mg     500 mg 100 mL/hr over 60 Minutes Intravenous Every 8 hours 08/12/19 1820     08/12/19 2200  ceFEPIme (MAXIPIME) 2 g in sodium chloride 0.9 % 100 mL IVPB     2 g 200 mL/hr over 30 Minutes Intravenous Every 12 hours 08/12/19 1832     08/12/19 1615  vancomycin (VANCOREADY) IVPB 1500 mg/300 mL     1,500 mg 150 mL/hr over 120 Minutes Intravenous  Once 08/12/19 1600 08/12/19 1956   08/12/19 1530  piperacillin-tazobactam (ZOSYN) IVPB 3.375 g     3.375 g 100 mL/hr over 30 Minutes Intravenous  Once 08/12/19 1526 08/12/19 1645   08/12/19 1530  vancomycin (VANCOCIN) IVPB 1000 mg/200 mL premix  Status:  Discontinued     1,000 mg 200 mL/hr over 60 Minutes Intravenous  Once 08/12/19 1526 08/12/19 1600      Subjective:  Patient seen at the bedside this morning.  Hemodynamically stable.  She refused to be examined and did not participate with any meaningful conversation.  Objective: Vitals:   08/13/19 0317 08/13/19 0608 08/13/19 0954 08/13/19 1103  BP: 110/62 (!) 99/58  (!) 102/59  Pulse: (!) 115 (!) 108  75  Resp: Temp: 99.1 F (37.3  C) 98.6 F (37 C)  98.8 F (37.1 C)  TempSrc: Oral Oral  Oral  SpO2: 99% 97%  99%  Weight:   75 kg   Height:   5\' 3"  (1.6 m)     Intake/Output Summary (Last 24 hours) at 08/13/2019 1316 Last data filed at 08/13/2019 1031 Gross per 24 hour  Intake 534.87 ml  Output 350 ml  Net 184.87 ml   Filed Weights   08/13/19 0954  Weight: 75 kg    Examination:  General exam: Not in distress,  very deconditioned, debilitated, disheavled Bilateral erythematous lower extremities wrapped with dressings Patient denied examination    Data Reviewed: I have personally reviewed following labs and imaging studies  CBC: Recent Labs  Lab 08/12/19 1524 08/13/19 0603  WBC 4.8 5.1  NEUTROABS 3.5  --   HGB 9.5* 7.9*  HCT 32.3* 27.3*  MCV 92.8 93.5  PLT 382 270   Basic Metabolic Panel: Recent Labs  Lab 08/12/19 1524 08/13/19 0603  NA 137 138  K 4.7 3.9  CL 102 104  CO2 26 24  GLUCOSE 117* 83  BUN 26* 22  CREATININE 1.13* 1.06*  CALCIUM 9.1 8.4*   GFR: Estimated Creatinine Clearance: 49.2 mL/min (A) (by C-G formula based on SCr of 1.06 mg/dL (H)). Liver Function Tests: Recent Labs  Lab 08/12/19 1524  AST 23  ALT 18  ALKPHOS 80  BILITOT 0.5  PROT 8.3*  ALBUMIN 2.7*   No results for input(s): LIPASE, AMYLASE in the last 168 hours. No results for input(s): AMMONIA in the last 168 hours. Coagulation Profile: No results for input(s): INR, PROTIME in the last 168 hours. Cardiac Enzymes: No results for input(s): CKTOTAL, CKMB, CKMBINDEX, TROPONINI in the last 168 hours. BNP (last 3 results) No results for input(s): PROBNP in the last 8760 hours. HbA1C: No results for input(s): HGBA1C in the last 72 hours. CBG: No results for input(s): GLUCAP in the last 168 hours. Lipid Profile: No results for input(s): CHOL, HDL, LDLCALC, TRIG, CHOLHDL, LDLDIRECT in the last 72 hours. Thyroid Function Tests: No results for input(s): TSH, T4TOTAL, FREET4, T3FREE, THYROIDAB in the last 72 hours. Anemia Panel: No results for input(s): VITAMINB12, FOLATE, FERRITIN, TIBC, IRON, RETICCTPCT in the last 72 hours. Sepsis Labs: Recent Labs  Lab 08/12/19 1550 08/12/19 1955  LATICACIDVEN 1.6 1.3    Recent Results (from the past 240 hour(s))  Blood culture (routine x 2)     Status: None (Preliminary result)   Collection Time: 08/12/19  3:24 PM   Specimen: BLOOD  Result Value Ref  Range Status   Specimen Description   Final    BLOOD LEFT ANTECUBITAL Performed at [facility], [place] [address], [place], [place] [address]    Special Requests   Final    BOTTLES DRAWN AEROBIC AND ANAEROBIC Blood Culture adequate volume Performed at [facility] [address], [place], [place] [address]    Culture   Final    NO GROWTH < 24 HOURS Performed at [facility] [address], [place], [place] [address]    Report Status PENDING  Incomplete  Blood culture (routine x 2)     Status: None (Preliminary result)   Collection Time: 08/12/19  3:29 PM   Specimen: BLOOD RIGHT FOREARM  Result Value Ref Range Status   Specimen Description   Final    BLOOD RIGHT FOREARM Performed at [facility] [address], [place], [place] [address]  Special Requests   Final    BOTTLES DRAWN AEROBIC AND ANAEROBIC Blood Culture adequate volume Performed at [facility], [address], [place], [place] [address]    Culture   Final    NO GROWTH < 24 HOURS Performed at [facility], [address] [place], [place] [address]    Report Status PENDING  Incomplete  SARS CORONAVIRUS 2 (TAT 6-24 HRS) Nasopharyngeal Nasopharyngeal Swab     Status: None   Collection Time: 08/12/19  3:50 PM   Specimen: Nasopharyngeal Swab  Result Value Ref Range Status   SARS Coronavirus 2 NEGATIVE NEGATIVE Final    Comment: (NOTE) SARS-CoV-2 target nucleic acids are NOT DETECTED. The SARS-CoV-2 RNA is generally detectable in upper and lower respiratory specimens during the acute phase of infection. Negative results do not preclude SARS-CoV-2 infection, do not rule out co-infections with other pathogens, and should not be used as the sole basis for treatment or other patient management decisions. Negative results must be combined with clinical observations, patient history, and epidemiological information. The expected result is  Negative. Fact Sheet for Patients: HairSlick.no Fact Sheet for Healthcare Providers: quierodirigir.com This test is not yet approved or cleared by the [place] FDA and  has been authorized for detection and/or diagnosis of SARS-CoV-2 by FDA under an Emergency Use Authorization (EUA). This EUA will remain  in effect (meaning this test can be used) for the duration of the COVID-19 declaration under Section 56 4(b)(1) of the Act, 21 U.S.C. section 360bbb-3(b)(1), unless the authorization is terminated or revoked sooner. Performed at [facility], [address] [place], [place] [address]   MRSA PCR Screening     Status: None   Collection Time: 08/13/19  6:03 AM   Specimen: Nasopharyngeal  Result Value Ref Range Status   MRSA by PCR NEGATIVE NEGATIVE Final    Comment:        The GeneXpert MRSA Assay (FDA approved for NASAL specimens only), is one component of a comprehensive MRSA colonization surveillance program. It is not intended to diagnose MRSA infection nor to guide or monitor treatment for MRSA infections. Performed at [facility], [address] [place], [place] [address]          Radiology Studies: DG Tibia/Fibula Left  Result Date: 08/12/2019 CLINICAL DATA:  Cellulitis. EXAM: LEFT TIBIA AND FIBULA - 2 VIEW COMPARISON:  Left tibia and fibular radiographs 06/20/2019 FINDINGS: Subcutaneous edema is present. There is no subcutaneous gas. Degenerative changes are noted in the knee, more prominent medial. This is stable. No acute or focal osseous abnormality is present otherwise. IMPRESSION: 1. Diffuse subcutaneous edema without evidence for subcutaneous gas. 2. No acute or focal osseous abnormality. 3. Stable degenerative changes in the knee. Electronically Signed   By: Marin Roberts M.D.   On: 08/12/2019 16:02   DG Tibia/Fibula Right  Result Date: 08/12/2019 CLINICAL DATA:   Cellulitis EXAM: RIGHT TIBIA AND FIBULA - 2 VIEW COMPARISON:  None. FINDINGS: No acute fracture or dislocation. Generalized osteopenia. No soft tissue emphysema. Generalized soft tissue swelling. No radiopaque foreign body. IMPRESSION: No acute osseous injury of the right tibia or fibula. Electronically Signed   By: Elige Ko   On: 08/12/2019 16:02        Scheduled Meds: . heparin  5,000 Units Subcutaneous Q8H   Continuous Infusions: . ceFEPime (MAXIPIME) IV 2 g (08/13/19 1058)  . metronidazole 500 mg (08/13/19 0550)  . vancomycin  LOS: 1 day    Time spent: 25 mins.More than 50% of that time was spent in counseling and/or coordination of care.      Burnadette PopAmrit Rich Paprocki, MD [facility] Pager [phone]  If 7PM-7AM, please contact night-coverage [email or web address] Password [email or web address] 08/13/2019, 1:16 PM

## 2019-08-13 NOTE — Progress Notes (Signed)
[name] ([id number]) Visit Report for 08/12/2019 Abuse/Suicide Risk Screen Details Patient Name: Date of Service: [name], [name] 08/12/2019 10:30 AM Medical Record [id number] Patient Account Number: [id number] Date of Birth/Sex: Treating RN: [birth year] (68 y.o. [name] Primary Care Taydon Nasworthy: [place] Constable Other Clinician: Referring Remell Giaimo: Treating Makinzi Prieur/Extender:Stone III, Nancie Neas, BAILEY Weeks in Treatment: 0 Abuse/Suicide Risk Screen Items Answer ABUSE RISK SCREEN: Has anyone close to you tried to hurt or harm you recentlyo No Do you feel uncomfortable with anyone in your familyo No Has anyone forced you do things that you didnt want to doo No Electronic Signature(s) Signed: 08/13/2019 5:33:40 PM By: Kela Millin Entered By: Kela Millin on 08/12/2019 11:28:20 -------------------------------------------------------------------------------- Activities of Daily Living Details Patient Name: Date of Service: [name] 08/12/2019 10:30 AM Medical Record [id number] Patient Account Number: [id number] Date of Birth/Sex: Treating RN: Dec 07, 1950 (68 y.o. [name] Primary Care Samentha Perham: [place] Constable Other Clinician: Referring Makenize Messman: Treating Taym Twist/Extender:Stone III, Nancie Neas, BAILEY Weeks in Treatment: 0 Activities of Daily Living Items Answer Activities of Daily Living (Please select one for each item) Drive Automobile Not Able Take Medications Need Assistance Use Telephone Need Assistance Care for Appearance Need Assistance Use Toilet Need Assistance Bath / Shower Need Assistance Dress Self Need Assistance Feed Self Completely Able Walk Need Assistance Get In / Out Bed Need Assistance Housework Need Assistance Prepare Meals Need Assistance Handle Money Need Assistance Shop for Self Need Assistance Electronic Signature(s) Signed: 08/13/2019 5:33:40 PM By: Kela Millin Entered By: Kela Millin on 08/12/2019 11:28:52 -------------------------------------------------------------------------------- Education Screening Details Patient Name: Date of Service: Caroline [facility]. 08/12/2019 10:30 AM Medical Record [id number] Patient Account Number: [id number] Date of Birth/Sex: Treating RN: 08-22-1951 (68 y.o. [name] Primary Care Oceanna Arruda: [place] Constable Other Clinician: Referring Laketia Vicknair: Treating Jarriel Papillion/Extender:Stone III, Nancie Neas, BAILEY Weeks in Treatment: 0 Primary Learner Assessed: Patient Learning Preferences/Education Level/Primary Language Learning Preference: Explanation Preferred Language: English Cognitive Barrier Language Barrier: No Translator Needed: No Memory Deficit: No Emotional Barrier: No Cultural/Religious Beliefs Affecting Medical Care: No Physical Barrier Impaired Vision: No Impaired Hearing: No Decreased Hand dexterity: No Knowledge/Comprehension Knowledge Level: Medium Comprehension Level: Medium Ability to understand written High instructions: Ability to understand verbal High instructions: Motivation Anxiety Level: Calm Cooperation: Cooperative Education Importance: Acknowledges Need Interest in Health Problems: Asks Questions Perception: Coherent Willingness to Engage in Self- Medium Management Activities: Readiness to Engage in Self- Low Management Activities: Electronic Signature(s) Signed: 08/13/2019 5:33:40 PM By: Kela Millin Entered By: Kela Millin on 08/12/2019 11:29:26 -------------------------------------------------------------------------------- Fall Risk Assessment Details Patient Name: Date of Service: Caroline [facility]. 08/12/2019 10:30 AM Medical Record [id number] Patient Account Number: [id number] Date of Birth/Sex: Treating RN: Jun 18, 1951 (68 y.o. [name] Primary Care Bernisha Verma: [place] Constable Other  Clinician: Referring Severin Bou: Treating Brantley Wiley/Extender:Stone III, Nancie Neas, BAILEY Weeks in Treatment: 0 Fall Risk Assessment Items Have you had 2 or more falls in the last 12 monthso 0 Yes Have you had any fall that resulted in injury in the last 12 monthso 0 No FALLS RISK SCREEN History of falling - immediate or within 3 months 25 Yes Secondary diagnosis (Do you have 2 or more medical diagnoseso) 15 Yes Ambulatory aid None/bed rest/wheelchair/nurse 0 Yes Crutches/cane/walker 0 No Furniture 0 No Intravenous therapy Access/Saline/Heparin Lock 0 No Weak (short steps with or without shuffle, stooped but able to lift head 0 No while walking, may seek support from furniture) Impaired (short steps with shuffle, may have difficulty arising from chair, 0  No head down, impaired balance) Mental Status Oriented to own ability 0 Yes Overestimates or forgets limitations 15 Yes Risk Level: High Risk Score: 55 Electronic Signature(s) Signed: 08/13/2019 5:33:40 PM By: Cherylin Mylar Entered By: Cherylin Mylar on 08/12/2019 11:30:00 -------------------------------------------------------------------------------- Foot Assessment Details Patient Name: Date of Service: Caroline Hesselbach. 08/12/2019 10:30 AM Medical Record [id number] Patient Account Number: [id number] Date of Birth/Sex: Treating RN: 25-Mar-1951 (68 y.o. Harvest Dark Primary Care Billye Nydam: Luis Abed Other Clinician: Referring Alyxandra Tenbrink: Treating Babara Buffalo/Extender:Stone III, Lanier Ensign, BAILEY Weeks in Treatment: 0 Foot Assessment Items Site Locations + = Sensation present, - = Sensation absent, C = Callus, U = Ulcer R = Redness, W = Warmth, M = Maceration, PU = Pre-ulcerative lesion F = Fissure, S = Swelling, D = Dryness Assessment Right: Left: Other Deformity: No No Prior Foot Ulcer: No No Prior Amputation: No No Charcot Joint: No No Ambulatory Status: Non-ambulatory Assistance  Device: Wheelchair Gait: Surveyor, mining) Signed: 08/13/2019 5:33:40 PM By: Cherylin Mylar Entered By: Cherylin Mylar on 08/12/2019 11:30:46 -------------------------------------------------------------------------------- Nutrition Risk Screening Details Patient Name: Date of Service: KEYMANI, GLYNN 08/12/2019 10:30 AM Medical Record [id number] Patient Account Number: [id number] Date of Birth/Sex: Treating RN: 09-03-1950 (68 y.o. Harvest Dark Primary Care Kraig Genis: Luis Abed Other Clinician: Referring Darling Cieslewicz: Treating Amelda Hapke/Extender:Stone III, Lanier Ensign, BAILEY Weeks in Treatment: 0 Height (in): 66 Weight (lbs): 178 Body Mass Index (BMI): 28.7 Nutrition Risk Screening Items Score Screening NUTRITION RISK SCREEN: I have an illness or condition that made me change the kind and/or 0 No amount of food I eat I eat fewer than two meals per day 0 No I eat few fruits and vegetables, or milk products 0 No I have three or more drinks of beer, liquor or wine almost every day 0 No I have tooth or mouth problems that make it hard for me to eat 0 No I don't always have enough money to buy the food I need 0 No I eat alone most of the time 0 No I take three or more different prescribed or over-the-counter drugs a day 1 Yes 2 Yes Without wanting to, I have lost or gained 10 pounds in the last six months I am not always physically able to shop, cook and/or feed myself 0 No Nutrition Protocols Good Risk Protocol Provide education on Moderate Risk Protocol 0 nutrition High Risk Proctocol Risk Level: Moderate Risk Score: 3 Electronic Signature(s) Signed: 08/13/2019 5:33:40 PM By: Cherylin Mylar Entered By: Cherylin Mylar on 08/12/2019 11:30:27

## 2019-08-13 NOTE — Progress Notes (Signed)
GAYTHA, RAYBOURN ([id number]) Visit Report for 08/12/2019 Allergy List Details Patient Name: Date of Service: ZABDI, MIS 08/12/2019 10:30 AM Medical Record [id number] Patient Account Number: [id number] Date of Birth/Sex: Treating RN: 1950-12-22 (68 y.o. [name] Primary Care Ren Grasse: [place] Constable Other Clinician: Referring Adraine Biffle: Treating Essam Lowdermilk/Extender:Stone III, Nancie Neas, BAILEY Weeks in Treatment: 0 Allergies Active Allergies codeine Tylenol Reaction: hives Allergy Notes Electronic Signature(s) Signed: 08/13/2019 5:33:40 PM By: Kela Millin Entered By: Kela Millin on 08/12/2019 11:25:10 -------------------------------------------------------------------------------- Arrival Information Details Patient Name: Date of Service: [name]. 08/12/2019 10:30 AM Medical Record [id number] Patient Account Number: [id number] Date of Birth/Sex: Treating RN: 03-30-1951 (68 y.o. [name] Primary Care Dalin Caldera: [place] Constable Other Clinician: Referring Mykel Mohl: Treating Theadore Blunck/Extender:Stone III, Nancie Neas, BAILEY Weeks in Treatment: 0 Visit Information Patient Arrived: Wheel Chair Arrival Time: 11:23 Accompanied By: caregiver Transfer Assistance: EasyPivot Patient Lift Patient Identification Verified: Yes Secondary Verification Process Yes Completed: Notes declined in house ABI's Electronic Signature(s) Signed: 08/13/2019 5:33:40 PM By: Kela Millin Entered By: Kela Millin History Since Last Visit Pain Present Now: Yes -------------------------------------------------------------------------------- Clinic Level of Care Assessment Details Patient Name: Date of Service: DESTIN, KITTLER 08/12/2019 10:30 AM Medical Record [id number] Patient Account Number: [id number] Date of Birth/Sex: Treating RN: 1951/08/10 (68 y.o. [name] Primary Care Jisell Majer: [place] Constable Other Clinician: Referring Payzlee Ryder: Treating Jhene Westmoreland/Extender:Stone III, Nancie Neas, BAILEY Weeks in Treatment: 0 Clinic Level of Care Assessment Items TOOL 2 Quantity Score X - Use when only an EandM is performed on the INITIAL visit 1 0 ASSESSMENTS - Nursing Assessment / Reassessment X - General Physical Exam (combine w/ comprehensive assessment (listed just below) 1 20 when performed on new pt. evals) X - Comprehensive Assessment (HX, ROS, Risk Assessments, Wounds Hx, etc.) 1 25 ASSESSMENTS - Wound and Skin Assessment / Reassessment []  - Simple Wound Assessment / Reassessment - one wound 0 X - Complex Wound Assessment / Reassessment - multiple wounds 2 5 []  - Dermatologic / Skin Assessment (not related to wound area) 0 ASSESSMENTS - Ostomy and/or Continence Assessment and Care []  - Incontinence Assessment and Management 0 []  - Ostomy Care Assessment and Management (repouching, etc.) 0 PROCESS - Coordination of Care X - Simple Patient / Family Education for ongoing care 1 15 []  - Complex (extensive) Patient / Family Education for ongoing care 0 X - Staff obtains Programmer, systems, Records, Test Results / Process Orders 1 10 X - Staff telephones HHA, Nursing Homes / Clarify orders / etc 1 10 []  - Routine Transfer to another Facility (non-emergent condition) 0 []  - Routine Hospital Admission (non-emergent condition) 0 X - New Admissions / Biomedical engineer / Ordering NPWT, Apligraf, etc. 1 15 []  - Emergency Hospital Admission (emergent condition) 0 []  - Simple Discharge Coordination 0 X - Complex (extensive) Discharge Coordination 1 15 PROCESS - Special Needs []  - Pediatric / Minor Patient Management 0 []  - Isolation Patient Management 0 []  - Hearing / Language / Visual special needs 0 []  - Assessment of Community assistance (transportation, D/C planning, etc.) 0 []  - Additional assistance / Altered mentation 0 []  - Support Surface(s) Assessment (bed, cushion, seat,  etc.) 0 INTERVENTIONS - Wound Cleansing / Measurement X - Wound Imaging (photographs - any number of wounds) 1 5 []  - Wound Tracing (instead of photographs) 0 []  - Simple Wound Measurement - one wound 0 X - Complex Wound Measurement - multiple wounds 2 5 []  - Simple Wound Cleansing - one wound 0 X -  Complex Wound Cleansing - multiple wounds 2 5 INTERVENTIONS - Wound Dressings []  - Small Wound Dressing one or multiple wounds 0 X - Medium Wound Dressing one or multiple wounds 2 15 []  - Large Wound Dressing one or multiple wounds 0 []  - Application of Medications - injection 0 INTERVENTIONS - Miscellaneous []  - External ear exam 0 []  - Specimen Collection (cultures, biopsies, blood, body fluids, etc.) 0 []  - Specimen(s) / Culture(s) sent or taken to Lab for analysis 0 []  - Patient Transfer (multiple staff / Michiel SitesHoyer Lift / Similar devices) 0 []  - Simple Staple / Suture removal (25 or less) 0 []  - Complex Staple / Suture removal (26 or more) 0 []  - Hypo / Hyperglycemic Management (close monitor of Blood Glucose) 0 []  - Ankle / Brachial Index (ABI) - do not check if billed separately 0 Has the patient been seen at the hospital within the last three years: Yes Total Score: 175 Level Of Care: New/Established - Level 5 Electronic Signature(s) Signed: 08/13/2019 5:32:06 PM By: Zandra AbtsLynch, Shatara RN, BSN Entered By: Zandra AbtsLynch, Shatara on 08/12/2019 15:26:10 -------------------------------------------------------------------------------- Encounter Discharge Information Details Patient Name: Date of Service: Byrd HesselbachWELLS, Monea M. 08/12/2019 10:30 AM Medical Record [id number] Patient Account Number: [id number] Date of Birth/Sex: Treating RN: 09/10/1950 (68 y.o. Wynelle LinkF) Lynch, Shatara Primary Care Jobina Maita: Luis AbedMECCARIELLO, BAILEY Other Clinician: Referring Juwon Scripter: Treating Shlomie Romig/Extender:Stone III, Lanier EnsignHoyt MECCARIELLO, BAILEY Weeks in Treatment: 0 Encounter Discharge Information Items Discharge Condition:  Stable Ambulatory Status: Wheelchair Discharge Destination: Emergency Room Orders Sent: Yes Transportation: Other Accompanied By: staff Schedule Follow-up Appointment: Yes Clinical Summary of Care: Patient Declined Notes patient transported to ER via wheelchair for evaluation of cellulitis Electronic Signature(s) Signed: 08/13/2019 5:32:06 PM By: Zandra AbtsLynch, Shatara RN, BSN Entered By: Zandra AbtsLynch, Shatara on 08/12/2019 16:33:50 -------------------------------------------------------------------------------- Lower Extremity Assessment Details Patient Name: Date of Service: Byrd HesselbachWELLS, Syrah M. 08/12/2019 10:30 AM Medical Record [id number] Patient Account Number: [id number] Date of Birth/Sex: Treating RN: 01/02/1951 (68 y.o. Harvest DarkF) Dwiggins, Shannon Primary Care Ilian Wessell: Luis AbedMECCARIELLO, BAILEY Other Clinician: Referring Brennan Litzinger: Treating Raynette Arras/Extender:Stone III, Lanier EnsignHoyt MECCARIELLO, BAILEY Weeks in Treatment: 0 Edema Assessment Assessed: [Left: No] [Right: No] Edema: [Left: Yes] [Right: Yes] Calf Left: Right: Point of Measurement: cm From Medial Instep 25 cm 40.5 cm Ankle Left: Right: Point of Measurement: cm From Medial Instep 36 cm 25 cm Vascular Assessment Pulses: Dorsalis Pedis Palpable: [Left:Yes] [Right:Yes] Electronic Signature(s) Signed: 08/13/2019 5:33:40 PM By: Cherylin Mylarwiggins, Shannon Entered By: Cherylin Mylarwiggins, Shannon on 08/12/2019 11:41:11 -------------------------------------------------------------------------------- Pain Assessment Details Patient Name: Date of Service: Byrd HesselbachWELLS, Abbegail M. 08/12/2019 10:30 AM Medical Record [id number] Patient Account Number: [id number] Date of Birth/Sex: Treating RN: 07/10/1951 (68 y.o. Wynelle LinkF) Lynch, Shatara Primary Care Lilja Soland: Luis AbedMECCARIELLO, BAILEY Other Clinician: Referring Bani Gianfrancesco: Treating Zackery Brine/Extender:Stone III, Lanier EnsignHoyt MECCARIELLO, BAILEY Weeks in Treatment: 0 Active Problems Location of Pain Severity and Description of Pain Patient Has  Paino Yes Site Locations With Dressing Change: Yes Duration of the Pain. Constant / Intermittento Constant Rate the pain. Current Pain Level: 10 Worst Pain Level: 10 Character of Pain Describe the Pain: Burning, Throbbing Pain Management and Medication Current Pain Management: Medication: No Cold Application: No Rest: No Massage: No Activity: No T.E.N.S.: No Heat Application: No Leg drop or elevation: No Is the Current Pain Management Adequate: Inadequate How does your wound impact your activities of daily livingo Sleep: No Bathing: No Appetite: No Relationship With Others: No Bladder Continence: No Emotions: No Bowel Continence: No Work: No Toileting: No Drive: No Dressing: No Hobbies: No Electronic Signature(s) Signed: 08/13/2019 5:32:06 PM By:  Zandra Abts RN, BSN Entered By: Zandra Abts on 08/12/2019 12:27:01 -------------------------------------------------------------------------------- Patient/Caregiver Education Details Patient Name: [name] 12/16/2020andnbsp10:30 Date of Service: AM Medical Record [id number] Number: Patient Account Number: [id number] Date of Birth/Gender: [birth year] (68 y.o. F) Treating RN: Zandra Abts Primary Care Other Clinician: Obie Dredge, Physician: Danton Clap Physician/Extender: Obie Dredge, Referring Physician: Renaldo Fiddler in Treatment: 0 Education Assessment Education Provided To: Patient Education Topics Provided Infection: Methods: Explain/Verbal Responses: State content correctly Wound/Skin Impairment: Methods: Explain/Verbal Responses: State content correctly Electronic Signature(s) Signed: 08/13/2019 5:32:06 PM By: Zandra Abts RN, BSN Entered By: Zandra Abts on 08/12/2019 15:24:58 -------------------------------------------------------------------------------- Wound Assessment Details Patient Name: Date of Service: [name] 08/12/2019 10:30 AM Medical Record  [id number] Patient Account Number: [id number] Date of Birth/Sex: Treating RN: [birth year] (68 y.o. Harvest Dark Primary Care Lattie Riege: Luis Abed Other Clinician: Referring Lance Huaracha: Treating Ellenore Roscoe/Extender:Stone III, Lanier Ensign, BAILEY Weeks in Treatment: 0 Wound Status Wound Number: 3 Primary Etiology: Pressure Ulcer Wound Location: Right Calcaneus Wound Status: Open Wounding Event: Not Known Comorbid History: Anemia, Arrhythmia Date Acquired: 06/28/2019 Weeks Of Treatment: 0 Clustered Wound: No Wound Measurements Length: (cm) 1.7 % Reductio Width: (cm) 2.5 % Reductio Depth: (cm) 0.1 Epithelial Area: (cm) 3.338 Tunneling Volume: (cm) 0.334 Undermini Wound Description Classification: Unstageable/Unclassified Wound Margin: Distinct, outline attached Exudate Amount: Medium Exudate Type: Purulent Exudate Color: yellow, brown, green Wound Bed Granulation Amount: Small (1-33%) Granulation Quality: Pink Necrotic Amount: Large (67-100%) Necrotic Quality: Adherent Slough Foul Odor After Cleansing: Yes Due to Product Use: No Slough/Fibrino Yes Exposed Structure Fascia Exposed: No Fat Layer (Subcutaneous Tissue) Exposed: No Tendon Exposed: No Muscle Exposed: No Joint Exposed: No Bone Exposed: No n in Area: 0% n in Volume: 0% ization: None : No ng: No Treatment Notes Wound #3 (Right Calcaneus) 1. Cleanse With Wound Cleanser 4. Secondary Dressing ABD Pad Roll Gauze 5. Secured With Secretary/administrator) Signed: 08/13/2019 5:33:40 PM By: Cherylin Mylar Entered By: Cherylin Mylar on 08/12/2019 11:55:42 -------------------------------------------------------------------------------- Wound Assessment Details Patient Name: Date of Service: PAMI, WOOL 08/12/2019 10:30 AM Medical Record [id number] Patient Account Number: [id number] Date of Birth/Sex: Treating RN: 1951/02/05 (68 y.o. Harvest Dark Primary  Care Travelle Mcclimans: Luis Abed Other Clinician: Referring Nazareth Kirk: Treating Cabrini Ruggieri/Extender:Stone III, Lanier Ensign, BAILEY Weeks in Treatment: 0 Wound Status Wound Number: 4 Primary Etiology: Pressure Ulcer Wound Location: Left Calcaneus - Lateral Wound Status: Open Wounding Event: Not Known Comorbid History: Anemia, Arrhythmia Date Acquired: 06/28/2019 Weeks Of Treatment: 0 Clustered Wound: No Wound Measurements Length: (cm) 3 Width: (cm) 4 Depth: (cm) 0.1 Area: (cm) 9.425 Volume: (cm) 0.942 Wound Description Classification: Unstageable/Unclassified Wound Margin: Distinct, outline attached Exudate Amount: Medium Exudate Type: Purulent Exudate Color: yellow, brown, green Wound Bed Granulation Amount: Small (1-33%) Granulation Quality: Pink Necrotic Amount: Large (67-100%) Necrotic Quality: Eschar, Adherent Slough After Cleansing: Yes duct Use: No rino Yes Exposed Structure sed: No Subcutaneous Tissue) Exposed: No sed: No sed: No ed: No d: No % Reduction in Area: % Reduction in Volume: Epithelialization: None Tunneling: No Undermining: No Foul Odor Due to Pro Slough/Fib Fascia Expo Fat Layer ( Tendon Expo Muscle Expo Joint Expos Bone Expose Treatment Notes Wound #4 (Left, Lateral Calcaneus) 1. Cleanse With Wound Cleanser 4. Secondary Dressing ABD Pad Roll Gauze 5. Secured With Secretary/administrator) Signed: 08/13/2019 5:33:40 PM By: Cherylin Mylar Entered By: Cherylin Mylar on 08/12/2019 11:55:27 -------------------------------------------------------------------------------- Wound Assessment Details Patient Name: Date of Service: TITYANA, PAGAN 08/12/2019 10:30 AM Medical Record [id number] Patient  Account Number: [id number] Date of Birth/Sex: Treating RN: 27-Dec-1950 (68 y.o. Harvest Dark Primary Care Hiroki Wint: Luis Abed Other Clinician: Referring Tyffani Foglesong: Treating Shahara Hartsfield/Extender:Stone  III, Lanier Ensign, BAILEY Weeks in Treatment: 0 Wound Status Wound Number: 5 Primary Etiology: Venous Leg Ulcer Wound Location: Right Lower Leg - Circumferential Wound Status: Open Wounding Event: Blister Comorbid History: Anemia, Arrhythmia Date Acquired: 03/28/2019 Weeks Of Treatment: 0 Clustered Wound: No Wound Measurements Length: (cm) 27.5 % Reduc Width: (cm) 39 % Reduc Depth: (cm) 0.2 Epithel Area: (cm) 842.34 Tunnel Volume: (cm) 168.468 Underm Wound Description Classification: Full Thickness Without Exposed Support Foul Od Structures Due to Wound Slough/ Distinct, outline attached Margin: Exudate Large Amount: Exudate Purulent Type: Exudate yellow, brown, green Color: Wound Bed Granulation Amount: Small (1-33%) Granulation Quality: Pink Fascia Necrotic Amount: Large (67-100%) Fat Lay Necrotic Quality: Adherent Slough Tendon Muscle Joint E Bone Ex or After Cleansing: Yes Product Use: No Fibrino Yes Exposed Structure Exposed: No er (Subcutaneous Tissue) Exposed: Yes Exposed: No Exposed: No xposed: No posed: No tion in Area: tion in Volume: ialization: None ing: No ining: No Treatment Notes Wound #5 (Right, Circumferential Lower Leg) 1. Cleanse With Wound Cleanser 4. Secondary Dressing ABD Pad Roll Gauze 5. Secured With Secretary/administrator) Signed: 08/13/2019 5:33:40 PM By: Cherylin Mylar Entered By: Cherylin Mylar on 08/12/2019 11:57:26 -------------------------------------------------------------------------------- Wound Assessment Details Patient Name: Date of Service: LATOYIA, TECSON 08/12/2019 10:30 AM Medical Record [id number] Patient Account Number: [id number] Date of Birth/Sex: Treating RN: 11/17/1950 (68 y.o. Harvest Dark Primary Care Stacye Noori: Luis Abed Other Clinician: Referring Edris Schneck: Treating Tafari Humiston/Extender:Stone III, Lanier Ensign, BAILEY Weeks in Treatment: 0 Wound  Status Wound Number: 6 Primary Etiology: Venous Leg Ulcer Wound Location: Left Lower Leg - Circumferential Wound Status: Open Wounding Event: Blister Comorbid History: Anemia, Arrhythmia Date Acquired: 03/28/2019 Weeks Of Treatment: 0 Clustered Wound: No Wound Measurements Length: (cm) 23 % Reduc Width: (cm) 33 % Reduc Depth: (cm) 0.2 Epithel Area: (cm) 596.117 Tunnel Volume: (cm) 119.223 Underm Wound Description Classification: Full Thickness Without Exposed Support Foul Od Structures Due to Wound Slough/ Distinct, outline attached Margin: Exudate Large Amount: Exudate Purulent Type: Exudate yellow, brown, green Color: Wound Bed Granulation Amount: Small (1-33%) Granulation Quality: Pink Fascia Necrotic Amount: Large (67-100%) Fat Lay Necrotic Quality: Adherent Slough Tendon Muscle Exp Joint Expo Bone Expos or After Cleansing: Yes Product Use: No Fibrino Yes Exposed Structure Exposed: No er (Subcutaneous Tissue) Exposed: Yes Exposed: No osed: No sed: No ed: No tion in Area: tion in Volume: ialization: None ing: No ining: No Treatment Notes Wound #6 (Left, Circumferential Lower Leg) 1. Cleanse With Wound Cleanser 4. Secondary Dressing ABD Pad Roll Gauze 5. Secured With Secretary/administrator) Signed: 08/13/2019 5:33:40 PM By: Cherylin Mylar Entered By: Cherylin Mylar on 08/12/2019 11:59:37 -------------------------------------------------------------------------------- Vitals Details Patient Name: Date of Service: [name] 08/12/2019 10:30 AM Medical Record [id number] Patient Account Number: [id number] Date of Birth/Sex: Treating RN: [birth year] (68 y.o. Harvest Dark Primary Care Gesenia Bantz: Luis Abed Other Clinician: Referring Alphus Zeck: Treating Negar Sieler/Extender:Stone III, Lanier Ensign, BAILEY Weeks in Treatment: 0 Vital Signs Time Taken: 11:23 Temperature (F): 98.1 Height (in): 66 Pulse  (bpm): 68 Source: Stated Respiratory Rate (breaths/min): 19 Weight (lbs): 178 Blood Pressure (mmHg): 121/52 Source: Stated Reference Range: 80 - 120 mg / dl Body Mass Index (BMI): 28.7 Electronic Signature(s) Signed: 08/13/2019 5:33:40 PM By: Cherylin Mylar Entered By: Cherylin Mylar on 08/12/2019 11:24:28

## 2019-08-13 NOTE — Consult Note (Addendum)
[facility] Nurse Consult Note: Reason for Consult: Consult requested for bilat legs.  Performed remotely after reviewing photos .and progress notes in the EMR.   Pt is familiar to [facility] team from previous admissions; refer to consult note on 10/22.  Wounds appeared to have declined at this time. 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 Last admission the patient was noted to have strong foul odor and maggots in the wounds; dressing changes at home appear to be a challenge.   Wound bed: 100% yellow, moist wound beds with large amt yellow drainage, generalized edema and erythremia. Dressing procedure/placement/frequency:  Topical treatment orders provided for bedside nurses to perform as follows: Aquacel to provide antimicrobial benefits and absorb drainage.  Ace wrap to provide light compression. Moisten previous dressings with NS to assist with removal and lightly scrub with gauze to assist with removal of nonviable tissue.  Pt could benefit from an ortho consult to provide further recommendations; please order if desired. Please re-consult if further assistance is needed.  Thank-you,  Julien Girt MSN, [place], [place], [place], [place]

## 2019-08-14 LAB — BASIC METABOLIC PANEL
Anion gap: 9 (ref 5–15)
BUN: 18 mg/dL (ref 8–23)
CO2: 26 mmol/L (ref 22–32)
Calcium: 8.5 mg/dL — ABNORMAL LOW (ref 8.9–10.3)
Chloride: 101 mmol/L (ref 98–111)
Creatinine, Ser: 0.87 mg/dL (ref 0.44–1.00)
GFR calc Af Amer: >60 mL/min (ref >60)
GFR calc non Af Amer: >60 mL/min (ref >60)
Glucose, Bld: 109 mg/dL — ABNORMAL HIGH (ref 70–99)
Potassium: 3.5 mmol/L (ref 3.5–5.1)
Sodium: 136 mmol/L (ref 135–145)

## 2019-08-14 LAB — CBC WITH DIFFERENTIAL/PLATELET
Abs Immature Granulocytes: 0.01 10*3/uL (ref 0.00–0.07)
Basophils Absolute: 0.1 10*3/uL (ref 0.0–0.1)
Basophils Relative: 2 %
Eosinophils Absolute: 0.1 10*3/uL (ref 0.0–0.5)
Eosinophils Relative: 3 %
HCT: 26.5 % — ABNORMAL LOW (ref 36.0–46.0)
Hemoglobin: 7.7 g/dL — ABNORMAL LOW (ref 12.0–15.0)
Immature Granulocytes: 0 %
Lymphocytes Relative: 22 %
Lymphs Abs: 0.8 10*3/uL (ref 0.7–4.0)
MCH: 26.7 pg (ref 26.0–34.0)
MCHC: 29.1 g/dL — ABNORMAL LOW (ref 30.0–36.0)
MCV: 92 fL (ref 80.0–100.0)
Monocytes Absolute: 0.4 10*3/uL (ref 0.1–1.0)
Monocytes Relative: 11 %
Neutro Abs: 2.5 10*3/uL (ref 1.7–7.7)
Neutrophils Relative %: 62 %
Platelets: 270 10*3/uL (ref 150–400)
RBC: 2.88 MIL/uL — ABNORMAL LOW (ref 3.87–5.11)
RDW: 17 % — ABNORMAL HIGH (ref 11.5–15.5)
WBC: 3.9 10*3/uL — ABNORMAL LOW (ref 4.0–10.5)
nRBC: 0 % (ref 0.0–0.2)

## 2019-08-14 MED ORDER — AMOXICILLIN-POT CLAVULANATE 875-125 MG PO TABS: 1.0000 | ORAL_TABLET | Freq: Two times a day (BID) | ORAL | 0 refills | Status: DC

## 2019-08-14 MED ORDER — AMOXICILLIN-POT CLAVULANATE 875-125 MG PO TABS: 1.0000 | ORAL_TABLET | Freq: Two times a day (BID) | ORAL | 0 refills | Status: AC

## 2019-08-14 NOTE — Care Management Important Message (Signed)
Important Message  Patient Details IM Letter given to Roque Lias SW Case Manager to present to the Patient Name: [name] MRN: [id number] Date of Birth: [birth year]   Medicare Important Message Given:  Yes     Kerin Salen 08/14/2019, 10:08 AM

## 2019-08-14 NOTE — Progress Notes (Signed)
Called report to [facility]. Pt being discharged via [facility]. Discharge instructions and medication education provided to [facility] and pt.

## 2019-08-14 NOTE — Discharge Summary (Signed)
Physician Discharge Summary  [name]:[id number] DOB: Oct 15, 1950 DOA: 08/12/2019  PCP: [name], DO  Admit date: 08/12/2019 Discharge date: 08/14/2019  Admitted From: Home Disposition:  Home  Discharge Condition:Stable CODE STATUS:FULL Diet recommendation: Heart Healthy  Brief/Interim Summary:  Patient is a 68 year old female with history of bilateral lower extremity chronic wounds, chronic A. fib not on anticoagulation, bipolar disorder, diabetes type 2 who was brought from [facility] assisted living facility for the evaluation of worsening cellulitis of lower extremity.  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  She also follows with wound care center.  She has history of purulent foul-smelling discharge from her wound.  On arrival she was hemodynamically stable.  She was started on broad-spectrum antibiotics for her cellulitis.  Wound care and orthopedics also consulted .  She is currently hemodynamically stable, afebrile.  Cultures have been negative so far.  Orthopedics recommended to continue antibiotics and follow-up with at the wound care center in a week.  Patient is very uncooperative and denies examination of her legs.  She is stable for discharge back to ALF with oral antibiotics.  Following problems were addressed during her hospitalization:  Bilateral lower extremity cellulitis: Has history of chronic bilateral lower extremity cellulitis.  Follows at wound care.  Wound care was following here.  Dr Saundra Shelling also consulted .  She is currently hemodynamically stable, afebrile.  Cultures have been negative so far.  Orthopedics recommended to continue antibiotics and follow-up with Dr Lajoyce Corners  at the wound care center in a week.  Patient is very uncooperative and denies examination of her legs. Dr Lajoyce Corners recommended silver alginate dressing with Kerlix and an Ace wrap for compression.  She needs wound care around-the-clock.  Chronic normocytic anemia: Currently H&H stable.  Continue home iron supplementation.Continue to monitor CBC  Chronic atrial flutter: Not on anticoagulation.  Continue metoprolol.  History of neuropathy: Continue Neurontin, duloxetine  Bipolar disorder: Currently not on any medicines  Debility/deconditioning: Assisted living facility resident.  As per the significant other, she has been recently bedbound and unable to walk due to several factors like her lower extremity infection, neuropathy.    Discharge Diagnoses:  Principal Problem:   Lower extremity cellulitis Active Problems:   Atrial fibrillation (HCC)   Anemia   Elevated serum creatinine   Severe protein-calorie malnutrition (HCC)   Venous stasis ulcers of both lower extremities [facility])    Discharge Instructions  Discharge Instructions    Diet - low sodium heart healthy   Complete by: As directed    Discharge instructions   Complete by: As directed    1)Please take prescribed medications as instructed. 2)Follow up with Dr Lajoyce Corners in a week. 3)Do a CBC and BMP tests in a week.   Increase activity slowly   Complete by: As directed      Allergies as of 08/14/2019      Reactions   Codeine Other (See Comments)   "Gets high"   Tylenol [acetaminophen] Hives      Medication List    STOP taking these medications   ferrous sulfate 325 (65 FE) MG tablet   hydrOXYzine 25 MG tablet Commonly known as: ATARAX/VISTARIL   nutrition supplement (JUVEN) Pack   traZODone 50 MG tablet Commonly known as: DESYREL     TAKE these medications   amoxicillin-clavulanate 875-125 MG tablet Commonly known as: Augmentin Take 1 tablet by mouth 2 (two) times daily for 7  days.   DULoxetine HCl 40 MG Cpep Take 40 mg by mouth daily.   gabapentin 300 MG capsule Commonly known as: NEURONTIN Take 2 capsules (600 mg total) by mouth 3 (three) times daily.   Gerhardt's butt cream Crea Apply 1  application topically 2 (two) times daily.   metoprolol tartrate 25 MG tablet Commonly known as: LOPRESSOR Take 0.5 tablets (12.5 mg total) by mouth 2 (two) times daily.   naproxen sodium 220 MG tablet Commonly known as: ALEVE Take 220 mg by mouth daily as needed (pain).      Follow-up Information    Nadara Mustard, MD In 1 week.   Specialty: Orthopedic Surgery Why: patient may follow-up in the wound center or I can follow-up in my office if she wants Contact information: [address] [place] [address] [phone]          Allergies  Allergen Reactions  . Codeine Other (See Comments)    "Gets high"  . Tylenol [Acetaminophen] Hives    Consultations: Orthopedics  Procedures/Studies: DG Tibia/Fibula Left  Result Date: 08/12/2019 CLINICAL DATA:  Cellulitis. EXAM: LEFT TIBIA AND FIBULA - 2 VIEW COMPARISON:  Left tibia and fibular radiographs 06/20/2019 FINDINGS: Subcutaneous edema is present. There is no subcutaneous gas. Degenerative changes are noted in the knee, more prominent medial. This is stable. No acute or focal osseous abnormality is present otherwise. IMPRESSION: 1. Diffuse subcutaneous edema without evidence for subcutaneous gas. 2. No acute or focal osseous abnormality. 3. Stable degenerative changes in the knee. Electronically Signed   By: Marin Roberts M.D.   On: 08/12/2019 16:02   DG Tibia/Fibula Right  Result Date: 08/12/2019 CLINICAL DATA:  Cellulitis EXAM: RIGHT TIBIA AND FIBULA - 2 VIEW COMPARISON:  None. FINDINGS: No acute fracture or dislocation. Generalized osteopenia. No soft tissue emphysema. Generalized soft tissue swelling. No radiopaque foreign body. IMPRESSION: No acute osseous injury of the right tibia or fibula. Electronically Signed   By: Elige Ko   On: 08/12/2019 16:02   CT Head Wo Contrast  Result Date: 07/28/2019 CLINICAL DATA:  Headache following fall EXAM: CT HEAD WITHOUT CONTRAST TECHNIQUE: Contiguous axial images  were obtained from the base of the skull through the vertex without intravenous contrast. COMPARISON:  None. FINDINGS: Brain: There is moderate diffuse atrophy. There is no intracranial mass, hemorrhage, extra-axial fluid collection, or midline shift. There is patchy small vessel disease throughout the centra semiovale bilaterally. There is small vessel disease in each thalamus region. No acute infarct is demonstrable on this study. Vascular: There is no hyperdense vessel. There is calcification in each carotid siphon region. Skull: The bony calvarium appears intact. There is a left frontal scalp hematoma. Sinuses/Orbits: There is opacification and mucosal thickening in the maxillary antra bilaterally. There is mucosal thickening and opacification in multiple ethmoid air cells. There is patchy opacity in the left sphenoid sinus with a small retention cyst in the right sphenoid sinus. There is mucosal thickening in the frontal sinuses bilaterally. Orbits appear symmetric bilaterally. Other: Mastoid air cells are clear. IMPRESSION: 1. Atrophy with supratentorial small vessel disease. No acute infarct. No mass or hemorrhage. 2.  There are foci of arterial vascular calcification. 3. There is a small left frontal scalp hematoma. No underlying fracture. 4.  Multifocal paranasal sinus disease. Electronically Signed   By: Bretta Bang III M.D.   On: 07/28/2019 14:08       Subjective: Patient seen at the bedside this morning.  Hemodynamically stable.  Looked comfortable.  She denied examination of her legs  Discharge Exam: Vitals:   08/13/19 2113 08/14/19 0531  BP: (!) 125/52 106/61  Pulse: 100 (!) 110  Resp: 17 17  Temp: 98.5 F (36.9 C) 99.4 F (37.4 C)  SpO2: 94% 95%   Vitals:   08/13/19 0954 08/13/19 1103 08/13/19 2113 08/14/19 0531  BP:  (!) 102/59 (!) 125/52 106/61  Pulse:  75 100 (!) 110  Resp:  16 17 17   Temp:  98.8 F (37.1 C) 98.5 F (36.9 C) 99.4 F (37.4 C)  TempSrc:  Oral Oral  Oral  SpO2:  99% 94% 95%  Weight: 75 kg     Height: 5\' 3"  (1.6 m)       General: Pt is alert, awake, not in acute distress Bilateral lower extremity cellulitis, wrapped with dressings   The results of significant diagnostics from this hospitalization (including imaging, microbiology, ancillary and laboratory) are listed below for reference.     Microbiology: Recent Results (from the past 240 hour(s))  Blood culture (routine x 2)     Status: None (Preliminary result)   Collection Time: 08/12/19  3:24 PM   Specimen: BLOOD  Result Value Ref Range Status   Specimen Description   Final    BLOOD LEFT ANTECUBITAL Performed at [facility], [place] [address] [place], [place] [address]    Special Requests   Final    BOTTLES DRAWN AEROBIC AND ANAEROBIC Blood Culture adequate volume Performed at [place] [address] [place], [place] [address]    Culture   Final    NO GROWTH < 24 HOURS Performed at [place] [address], [place], [place] [address]    Report Status PENDING  Incomplete  Blood culture (routine x 2)     Status: None (Preliminary result)   Collection Time: 08/12/19  3:29 PM   Specimen: BLOOD RIGHT FOREARM  Result Value Ref Range Status   Specimen Description   Final    BLOOD RIGHT FOREARM Performed at [place] [address], [place], [place] [address]    Special Requests   Final    BOTTLES DRAWN AEROBIC AND ANAEROBIC Blood Culture adequate volume Performed at [place] [address] [place], [place] [address]    Culture   Final    NO GROWTH < 24 HOURS Performed at [place] [address] [place], [place] [address]    Report Status PENDING  Incomplete  SARS CORONAVIRUS 2 (TAT 6-24 HRS) Nasopharyngeal Nasopharyngeal Swab     Status: None   Collection Time: 08/12/19  3:50 PM   Specimen: Nasopharyngeal Swab  Result Value Ref Range Status   SARS Coronavirus 2  NEGATIVE NEGATIVE Final    Comment: (NOTE) SARS-CoV-2 target nucleic acids are NOT DETECTED. The SARS-CoV-2 RNA is generally detectable in upper and lower respiratory specimens during the acute phase of infection. Negative results do not preclude SARS-CoV-2 infection, do not rule out co-infections with other pathogens, and should not be used as the sole basis for treatment or other patient management decisions. Negative results must be combined with clinical observations, patient history, and epidemiological information. The expected result is Negative. Fact Sheet for Patients: SugarRoll.be Fact Sheet for Healthcare Providers: https://www.woods-mathews.com/ This test is not yet approved or cleared by the [place] FDA and  has been authorized for detection and/or diagnosis of SARS-CoV-2 by FDA under an Emergency Use Authorization (EUA). This EUA will remain  in effect (meaning this test can be used) for the duration of the COVID-19 declaration under Section 56 4(b)(1) of the Act, 21 U.S.C. section 360bbb-3(b)(1), unless the authorization is terminated or revoked sooner. Performed at [facility], [address], [place], [place] [address]   MRSA PCR Screening     Status: None   Collection Time: 08/13/19  6:03 AM   Specimen: Nasopharyngeal  Result Value Ref Range Status   MRSA by PCR NEGATIVE NEGATIVE Final    Comment:        The GeneXpert MRSA Assay (FDA approved for NASAL specimens only), is one component of a comprehensive MRSA colonization surveillance program. It is not intended to diagnose MRSA infection nor to guide or monitor treatment for MRSA infections. Performed at [facility], [address] [place], [place] [address]      Labs: BNP (last 3 results) No results for input(s): BNP in the last 8760 hours. Basic Metabolic Panel: Recent Labs  Lab 08/12/19 1524 08/13/19 0603 08/14/19 0547   NA 137 138 136  K 4.7 3.9 3.5  CL 102 104 101  CO2 26 24 26   GLUCOSE 117* 83 109*  BUN 26* 22 18  CREATININE 1.13* 1.06* 0.87  CALCIUM 9.1 8.4* 8.5*   Liver Function Tests: Recent Labs  Lab 08/12/19 1524  AST 23  ALT 18  ALKPHOS 80  BILITOT 0.5  PROT 8.3*  ALBUMIN 2.7*   No results for input(s): LIPASE, AMYLASE in the last 168 hours. No results for input(s): AMMONIA in the last 168 hours. CBC: Recent Labs  Lab 08/12/19 1524 08/13/19 0603 08/14/19 0547  WBC 4.8 5.1 3.9*  NEUTROABS 3.5  --  2.5  HGB 9.5* 7.9* 7.7*  HCT 32.3* 27.3* 26.5*  MCV 92.8 93.5 92.0  PLT 382 279 270   Cardiac Enzymes: No results for input(s): CKTOTAL, CKMB, CKMBINDEX, TROPONINI in the last 168 hours. BNP: Invalid input(s): POCBNP CBG: No results for input(s): GLUCAP in the last 168 hours. D-Dimer No results for input(s): DDIMER in the last 72 hours. Hgb A1c No results for input(s): HGBA1C in the last 72 hours. Lipid Profile No results for input(s): CHOL, HDL, LDLCALC, TRIG, CHOLHDL, LDLDIRECT in the last 72 hours. Thyroid function studies No results for input(s): TSH, T4TOTAL, T3FREE, THYROIDAB in the last 72 hours.  Invalid input(s): FREET3 Anemia work up No results for input(s): VITAMINB12, FOLATE, FERRITIN, TIBC, IRON, RETICCTPCT in the last 72 hours. Urinalysis    Component Value Date/Time   COLORURINE YELLOW 05/03/2019 2333   APPEARANCEUR TURBID (A) 05/03/2019 2333   LABSPEC 1.031 (H) 05/03/2019 2333   PHURINE 5.0 05/03/2019 2333   GLUCOSEU NEGATIVE 05/03/2019 2333   HGBUR NEGATIVE 05/03/2019 2333   BILIRUBINUR NEGATIVE 05/03/2019 2333   KETONESUR NEGATIVE 05/03/2019 2333   PROTEINUR 100 (A) 05/03/2019 2333   NITRITE POSITIVE (A) 05/03/2019 2333   LEUKOCYTESUR TRACE (A) 05/03/2019 2333   Sepsis Labs Invalid input(s): PROCALCITONIN,  WBC,  LACTICIDVEN Microbiology Recent Results (from the past 240 hour(s))  Blood culture (routine x 2)     Status: None (Preliminary  result)   Collection Time: 08/12/19  3:24 PM   Specimen: BLOOD  Result Value Ref Range Status   Specimen Description   Final    BLOOD LEFT ANTECUBITAL Performed at [facility], [address] [place], [place]    Special Requests   Final    BOTTLES DRAWN AEROBIC AND ANAEROBIC Blood Culture adequate volume  Performed at [facility], [address], [place], [place] [address]    Culture   Final    NO GROWTH < 24 HOURS Performed at [facility], [address], [place], [place] [address]    Report Status PENDING  Incomplete  Blood culture (routine x 2)     Status: None (Preliminary result)   Collection Time: 08/12/19  3:29 PM   Specimen: BLOOD RIGHT FOREARM  Result Value Ref Range Status   Specimen Description   Final    BLOOD RIGHT FOREARM Performed at [facility], [address], [place], [place] [address]    Special Requests   Final    BOTTLES DRAWN AEROBIC AND ANAEROBIC Blood Culture adequate volume Performed at [facility], [address], [place], [place] [address]    Culture   Final    NO GROWTH < 24 HOURS Performed at [facility], [address], [place], [place] [address]    Report Status PENDING  Incomplete  SARS CORONAVIRUS 2 (TAT 6-24 HRS) Nasopharyngeal Nasopharyngeal Swab     Status: None   Collection Time: 08/12/19  3:50 PM   Specimen: Nasopharyngeal Swab  Result Value Ref Range Status   SARS Coronavirus 2 NEGATIVE NEGATIVE Final    Comment: (NOTE) SARS-CoV-2 target nucleic acids are NOT DETECTED. The SARS-CoV-2 RNA is generally detectable in upper and lower respiratory specimens during the acute phase of infection. Negative results do not preclude SARS-CoV-2 infection, do not rule out co-infections with other pathogens, and should not be used as the sole basis for treatment or other patient management decisions. Negative results must be combined with clinical  observations, patient history, and epidemiological information. The expected result is Negative. Fact Sheet for Patients: HairSlick.no Fact Sheet for Healthcare Providers: quierodirigir.com This test is not yet approved or cleared by the [place] FDA and  has been authorized for detection and/or diagnosis of SARS-CoV-2 by FDA under an Emergency Use Authorization (EUA). This EUA will remain  in effect (meaning this test can be used) for the duration of the COVID-19 declaration under Section 56 4(b)(1) of the Act, 21 U.S.C. section 360bbb-3(b)(1), unless the authorization is terminated or revoked sooner. Performed at [facility], [address], [place], [place] [address]   MRSA PCR Screening     Status: None   Collection Time: 08/13/19  6:03 AM   Specimen: Nasopharyngeal  Result Value Ref Range Status   MRSA by PCR NEGATIVE NEGATIVE Final    Comment:        The GeneXpert MRSA Assay (FDA approved for NASAL specimens only), is one component of a comprehensive MRSA colonization surveillance program. It is not intended to diagnose MRSA infection nor to guide or monitor treatment for MRSA infections. Performed at [facility], [address] [place], [place] [address]     Please note: You were cared for by a hospitalist during your hospital stay. Once you are discharged, your primary care physician will handle any further medical issues. Please note that NO REFILLS for any discharge medications will be authorized once you are discharged, as it is imperative that you return to your primary care physician (or establish a relationship with a primary care physician if you do not have one) for your post hospital discharge needs so that they can reassess your need for medications and monitor your lab values.    Time coordinating discharge: 40 minutes  SIGNED:  Burnadette PopAmrit Deegan Valentino, MD  [facility] 08/14/2019, 10:22 AM Pager [phone]  If 7PM-7AM, please contact night-coverage [email or web address] Password [email or web address]

## 2019-08-14 NOTE — TOC Initial Note (Addendum)
Transition of Care [facility]) - Initial/Assessment Note    Patient Details  Name: [name] MRN: [id number] Date of Birth: 03-31-1951  [facility] CM/SW Contact:    Ida Rogue, LCSW Phone Number: 08/14/2019, 9:18 AM  Clinical Narrative:   Clinical Social Worker facilitated patient discharge including contacting patient family and facility to confirm patient discharge plans.  Clinical information faxed to facility and family agreeable with plan.  CSW arranged ambulance transport via [facility] to [facility] .  RN to call [phone] for report prior to discharge.  [address]             Expected Discharge Plan: Assisted Living Barriers to Discharge: No Barriers Identified   Patient Goals and CMS Choice        Expected Discharge Plan and Services Expected Discharge Plan: Assisted Living                                              Prior Living Arrangements/Services                       Activities of Daily Living Home Assistive Devices/Equipment: Wheelchair ADL Screening (condition at time of admission) Patient's cognitive ability adequate to safely complete daily activities?: Yes Is the patient deaf or have difficulty hearing?: No Does the patient have difficulty seeing, even when wearing glasses/contacts?: No Does the patient have difficulty concentrating, remembering, or making decisions?: No Patient able to express need for assistance with ADLs?: Yes Does the patient have difficulty dressing or bathing?: Yes Independently performs ADLs?: No Communication: Independent Dressing (OT): Needs assistance Is this a change from baseline?: Pre-admission baseline Grooming: Needs assistance Is this a change from baseline?: Pre-admission baseline Feeding: Independent Bathing: Needs assistance Is this a change from baseline?: Pre-admission baseline Toileting: Needs assistance Is this a change from baseline?: Pre-admission baseline In/Out Bed:  Needs assistance Is this a change from baseline?: Pre-admission baseline Walks in Home: Dependent Is this a change from baseline?: Pre-admission baseline Does the patient have difficulty walking or climbing stairs?: Yes Weakness of Legs: Both Weakness of Arms/Hands: None  Permission Sought/Granted                  Emotional Assessment              Admission diagnosis:  Lower extremity cellulitis [L03.119] Cellulitis of lower extremity, unspecified laterality [L03.119] Patient Active Problem List   Diagnosis Date Noted  . Severe protein-calorie malnutrition (HCC)   . Venous stasis ulcers of both lower extremities (HCC)   . Lower extremity cellulitis 08/12/2019  . 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 Pressure injury of skin 06/19/2019  . Cellulitis of left lower extremity   . Elevated C-reactive protein (CRP)   . Cellulitis of right lower extremity 06/18/2019  . Leg ulcer (HCC)   . Adjustment disorder with mixed disturbance of emotions and conduct   . Type 2 diabetes mellitus with hyperglycemia (HCC) 05/04/2019  . AKI (acute kidney injury) (HCC)   . Infected ulcer of skin (HCC)   . High risk social situation   . Post-polio syndrome   . Chronic pain syndrome   . Limited mobility 04/20/2019  . Venous stasis ulcer (HCC) 03/31/2019  . Overgrown toenails 10/28/2018  . Wound of right leg 02/06/2018  . Atrial fibrillation (HCC) 01/03/2018  .  Anemia 01/03/2018  . Elevated serum creatinine 01/03/2018  . Bipolar I disorder with mania ([place]) 01/03/2018   PCP:  Cleophas Dunker, DO Pharmacy:   [place], [place] - [address] [place] [address] Phone: [phone] Fax: [place], [place] [address] Phone: [phone] Fax: [phone]     Social Determinants of Health (SDOH) Interventions     Readmission Risk Interventions No flowsheet data found.

## 2019-08-17 LAB — CULTURE, BLOOD (ROUTINE X 2)
Culture: NO GROWTH
Culture: NO GROWTH
Special Requests: ADEQUATE
Special Requests: ADEQUATE

## 2019-08-26 ENCOUNTER — Encounter (HOSPITAL_BASED_OUTPATIENT_CLINIC_OR_DEPARTMENT_OTHER): Payer: Medicare Other | Admitting: Internal Medicine

## 2019-08-26 ENCOUNTER — Other Ambulatory Visit: Payer: Self-pay

## 2019-08-26 DIAGNOSIS — L97812 Non-pressure chronic ulcer of other part of right lower leg with fat layer exposed: Secondary | ICD-10-CM | POA: Diagnosis not present

## 2019-08-26 DIAGNOSIS — D649 Anemia, unspecified: Secondary | ICD-10-CM | POA: Diagnosis not present

## 2019-08-26 DIAGNOSIS — L03115 Cellulitis of right lower limb: Secondary | ICD-10-CM | POA: Diagnosis not present

## 2019-08-26 DIAGNOSIS — Z885 Allergy status to narcotic agent status: Secondary | ICD-10-CM | POA: Diagnosis not present

## 2019-08-26 DIAGNOSIS — L89610 Pressure ulcer of right heel, unstageable: Secondary | ICD-10-CM | POA: Diagnosis not present

## 2019-08-26 DIAGNOSIS — L89620 Pressure ulcer of left heel, unstageable: Secondary | ICD-10-CM | POA: Diagnosis not present

## 2019-08-26 DIAGNOSIS — I4891 Unspecified atrial fibrillation: Secondary | ICD-10-CM | POA: Diagnosis not present

## 2019-08-26 DIAGNOSIS — F319 Bipolar disorder, unspecified: Secondary | ICD-10-CM | POA: Diagnosis not present

## 2019-08-26 DIAGNOSIS — I890 Lymphedema, not elsewhere classified: Secondary | ICD-10-CM | POA: Diagnosis not present

## 2019-08-26 DIAGNOSIS — L97822 Non-pressure chronic ulcer of other part of left lower leg with fat layer exposed: Secondary | ICD-10-CM | POA: Diagnosis not present

## 2019-08-26 DIAGNOSIS — F17200 Nicotine dependence, unspecified, uncomplicated: Secondary | ICD-10-CM | POA: Diagnosis not present

## 2019-08-26 DIAGNOSIS — Z886 Allergy status to analgesic agent status: Secondary | ICD-10-CM | POA: Diagnosis not present

## 2019-08-26 NOTE — Progress Notes (Addendum)
[name], Caroline M. ([id number]) Visit Report for 08/26/2019 Arrival Information Details Patient Name: Date of Service: [name], Caroline M. 08/26/2019 10:30 AM Medical Record [id number] Patient Account Number: [id number] Date of Birth/Sex: Treating RN: 02/14/1951 (68 y.o. Arta SilenceF) Deaton, Bobbi Primary Care Lorena Benham: Luis AbedMECCARIELLO, BAILEY Other Clinician: Referring Tyree Fluharty: Treating Emiliano Welshans/Extender:Madduri, Kenard GowerMurthy MECCARIELLO, BAILEY Weeks in Treatment: 2 Visit Information History Since Last Visit Added or deleted any medications: No Patient Arrived: Wheel Chair Any new allergies or adverse reactions: No Arrival Time: 10:29 Had a fall or experienced change in No activities of daily living that may affect Accompanied By: caregiver risk of falls: Transfer Assistance: None Signs or symptoms of abuse/neglect since last No Patient Identification Verified: Yes visito Secondary Verification Process Completed: Yes Hospitalized since last visit: No Patient Requires Transmission-Based No Implantable device outside of the clinic excluding No Precautions: cellular tissue based products placed in the center Patient Has Alerts: No since last visit: Has Dressing in Place as Prescribed: Yes Pain Present Now: Yes Electronic Signature(s) Signed: 08/26/2019 5:32:50 PM By: Shawn Stalleaton, Bobbi Entered By: Shawn Stalleaton, Bobbi on 08/26/2019 10:29:22 -------------------------------------------------------------------------------- Encounter Discharge Information Details Patient Name: Date of Service: [name], Caroline M. 08/26/2019 10:30 AM Medical Record [id number] Patient Account Number: [id number] Date of Birth/Sex: Treating RN: 12/02/1950 (68 y.o. Freddy FinnerF) Epps, Carrie Primary Care Soma Bachand: Luis AbedMECCARIELLO, BAILEY Other Clinician: Referring Lakin Romer: Treating Solara Goodchild/Extender:Madduri, Kenard GowerMurthy MECCARIELLO, BAILEY Weeks in Treatment: 2 Encounter Discharge Information Items Post Procedure Vitals Discharge Condition:  Stable Temperature (F): 99 Ambulatory Status: Wheelchair Pulse (bpm): 75 Discharge Destination: Skilled Nursing Facility Respiratory Rate (breaths/min): 16 Telephoned: No Blood Pressure (mmHg): 113/51 Orders Sent: Yes Transportation: Other Accompanied By: caregiver Schedule Follow-up Yes Appointment: Clinical Summary of Care: Patient Declined Electronic Signature(s) Signed: 08/26/2019 5:40:54 PM By: Yevonne PaxEpps, Carrie RN Entered By: Yevonne PaxEpps, Carrie on 08/26/2019 11:46:27 -------------------------------------------------------------------------------- Lower Extremity Assessment Details Patient Name: Date of Service: [name], Caroline M. 08/26/2019 10:30 AM Medical Record [id number] Patient Account Number: [id number] Date of Birth/Sex: Treating RN: 10/12/1950 (68 y.o. Arta SilenceF) Deaton, Bobbi Primary Care Shawnna Pancake: Luis AbedMECCARIELLO, BAILEY Other Clinician: Referring Jackilyn Umphlett: Treating Angelito Hopping/Extender:Madduri, Kenard GowerMurthy MECCARIELLO, BAILEY Weeks in Treatment: 2 Edema Assessment Assessed: [Left: Yes] [Right: Yes] Edema: [Left: Yes] [Right: Yes] Calf Left: Right: Point of Measurement: cm From Medial Instep 38 cm 40.5 cm Ankle Left: Right: Point of Measurement: cm From Medial Instep 24 cm 24 cm Electronic Signature(s) Signed: 08/26/2019 5:32:50 PM By: Shawn Stalleaton, Bobbi Entered By: Shawn Stalleaton, Bobbi on 08/26/2019 10:34:26 -------------------------------------------------------------------------------- Multi-Disciplinary Care Plan Details Patient Name: Date of Service: [name], Caroline M. 08/26/2019 10:30 AM Medical Record [id number] Patient Account Number: [id number] Date of Birth/Sex: Treating RN: 08/25/1951 (68 y.o. Tommye StandardF) Boehlein, Linda Primary Care Emersyn Kotarski: Luis AbedMECCARIELLO, BAILEY Other Clinician: Referring Fynn Adel: Treating Ramar Nobrega/Extender:Madduri, Kenard GowerMurthy MECCARIELLO, BAILEY Weeks in Treatment: 2 Active Inactive Venous Leg Ulcer Nursing Diagnoses: Knowledge deficit related to disease process and  management Potential for venous Insuffiency (use before diagnosis confirmed) Goals: Patient will maintain optimal edema control Date Initiated: 08/26/2019 Target Resolution Date: 09/23/2019 Goal Status: Active Patient/caregiver will verbalize understanding of disease process and disease management Date Initiated: 08/26/2019 Target Resolution Date: 09/23/2019 Goal Status: Active Interventions: Assess peripheral edema status every visit. Compression as ordered Treatment Activities: Therapeutic compression applied : 08/26/2019 Notes: Wound/Skin Impairment Nursing Diagnoses: Impaired tissue integrity Knowledge deficit related to ulceration/compromised skin integrity Goals: Patient/caregiver will verbalize understanding of skin care regimen Date Initiated: 08/26/2019 Target Resolution Date: 09/23/2019 Goal Status: Active Ulcer/skin breakdown will have a volume reduction of 30% by week 4 Date Initiated: 08/26/2019 Target Resolution Date: 09/23/2019 Goal  Status: Active Interventions: Assess patient/caregiver ability to obtain necessary supplies Assess patient/caregiver ability to perform ulcer/skin care regimen upon admission and as needed Assess ulceration(s) every visit Treatment Activities: Skin care regimen initiated : 08/26/2019 Topical wound management initiated : 08/26/2019 Notes: Electronic Signature(s) Signed: 08/26/2019 5:40:33 PM By: Zenaida Deed RN, BSN Entered By: Zenaida Deed on 08/26/2019 11:14:44 -------------------------------------------------------------------------------- Pain Assessment Details Patient Name: Date of Service: [name]. 08/26/2019 10:30 AM Medical Record [id number] Patient Account Number: [id number] Date of Birth/Sex: Treating RN: Jul 06, 1951 (68 y.o. Arta Silence Primary Care Jyden Kromer: Luis Abed Other Clinician: Referring Lisseth Brazeau: Treating Lizza Huffaker/Extender:Madduri, Kenard Gower, BAILEY Weeks in  Treatment: 2 Active Problems Location of Pain Severity and Description of Pain Patient Has Paino Yes Site Locations Pain Location: Pain in Ulcers Rate the pain. Current Pain Level: 3 Worst Pain Level: 9 Least Pain Level: 0 Tolerable Pain Level: 8 Character of Pain Describe the Pain: Dull Pain Management and Medication Current Pain Management: Medication: Yes Cold Application: No Rest: Yes Massage: No Activity: No T.E.N.S.: No Heat Application: No Leg drop or elevation: Yes Is the Current Pain Management Adequate: Adequate How does your wound impact your activities of daily livingo Sleep: No Bathing: No Appetite: No Relationship With Others: No Bladder Continence: No Emotions: No Bowel Continence: No Work: No Toileting: No Drive: No Dressing: No Hobbies: No Electronic Signature(s) Signed: 08/26/2019 5:32:50 PM By: Shawn Stall Entered By: Shawn Stall on 08/26/2019 10:30:25 -------------------------------------------------------------------------------- Patient/Caregiver Education Details Patient Name: [name] 12/30/2020andnbsp10:30 Date of Service: AM Medical Record [id number] Number: Patient Account Number: [id number] Date of Birth/Gender: 1951/05/07 (68 y.o. F) Treating RN: Zenaida Deed Primary Care Other Clinician: Obie Dredge, Physician: Idolina Primer Physician/Extender: Obie Dredge, Referring Physician: Renaldo Fiddler in Treatment: 2 Education Assessment Education Provided To: Patient Education Topics Provided Venous: Methods: Explain/Verbal Responses: Reinforcements needed, State content correctly Wound Debridement: Methods: Explain/Verbal Responses: Reinforcements needed, State content correctly Wound/Skin Impairment: Methods: Explain/Verbal Responses: Reinforcements needed, State content correctly Electronic Signature(s) Signed: 08/26/2019 5:40:33 PM By: Zenaida Deed RN, BSN Entered By: Zenaida Deed on  08/26/2019 11:15:19 -------------------------------------------------------------------------------- Wound Assessment Details Patient Name: Date of Service: [name]. 08/26/2019 10:30 AM Medical Record [id number] Patient Account Number: [id number] Date of Birth/Sex: Treating RN: [birth year] (68 y.o. Debara Pickett, Millard.Loa Primary Care Shiesha Jahn: Luis Abed Other Clinician: Referring Alvis Pulcini: Treating Evetta Renner/Extender:Madduri, Kenard Gower, BAILEY Weeks in Treatment: 2 Wound Status Wound Number: 3 Primary Etiology: Pressure Ulcer Wound Location: Right Calcaneus Wound Status: Open Wounding Event: Not Known Comorbid History: Anemia, Arrhythmia Date Acquired: 06/28/2019 Weeks Of Treatment: 2 Clustered Wound: No Photos Wound Measurements Length: (cm) 1.4 % Reduction i Width: (cm) 2 % Reduction i Depth: (cm) 0.2 Epithelializa Area: (cm) 2.199 Tunneling: Volume: (cm) 0.44 Undermining: Wound Description Classification: Unstageable/Unclassified Foul Odor Af Wound Margin: Distinct, outline attached Slough/Fibri Exudate Amount: Medium Exudate Type: Purulent Exudate Color: yellow, brown, green Wound Bed Granulation Amount: Large (67-100%) Granulation Quality: Pink Fascia Expose Necrotic Amount: Small (1-33%) Fat Layer (Su Necrotic Quality: Adherent Slough Tendon Expose Muscle Expose Joint Exposed Bone Exposed: ter Cleansing: No no Yes Exposed Structure d: No bcutaneous Tissue) Exposed: No d: No d: No : No No n Area: 34.1% n Volume: -31.7% tion: Small (1-33%) No No Treatment Notes Wound #3 (Right Calcaneus) 1. Cleanse With Wound Cleanser Soap and water 2. Periwound Care Moisturizing lotion 3. Primary Dressing Applied Calcium Alginate Ag 4. Secondary Dressing ABD Pad Roll Gauze 5. Secured With Tape Notes Government social research officer) Signed: 08/31/2019 4:12:00 PM By:  Mikeal Hawthorne EMT/HBOT Signed: 08/31/2019 4:44:32 PM By: Deon Pilling Previous Signature: 08/26/2019 5:32:50 PM Version By: Deon Pilling Entered By: Mikeal Hawthorne on 08/31/2019 09:42:45 -------------------------------------------------------------------------------- Wound Assessment Details Patient Name: Date of Service: [name]. 08/26/2019 10:30 AM Medical Record [id number] Patient Account Number: [id number] Date of Birth/Sex: Treating RN: May 23, 1951 (67 y.o. Helene Shoe, Meta.Reding Primary Care Duke Weisensel: [place] Constable Other Clinician: Referring Jaleesa Cervi: Treating Rashawn Rolon/Extender:Madduri, Blanchard Mane, BAILEY Weeks in Treatment: 2 Wound Status Wound Number: 4 Primary Etiology: Pressure Ulcer Wound Location: Left Calcaneus - Lateral Wound Status: Open Wounding Event: Not Known Comorbid History: Anemia, Arrhythmia Date Acquired: 06/28/2019 Weeks Of Treatment: 2 Clustered Wound: No Photos Wound Measurements Length: (cm) 2.5 Width: (cm) 2.5 Depth: (cm) 0.1 Area: (cm) 4.909 Volume: (cm) 0.491 Wound Description Classification: Unstageable/Unclassified Wound Margin: Distinct, outline attached Exudate Amount: None Present Wound Bed Granulation Amount: None Present (0%) Necrotic Amount: Large (67-100%) Necrotic Quality: Eschar Treatment Notes Wound #4 (Left, Lateral Calcaneus) 1. Cleanse With Wound Cleanser Soap and water 2. Periwound Care Moisturizing lotion 3. Primary Dressing Applied Calcium Alginate Ag 4. Secondary Dressing ABD Pad Roll Gauze 5. Secured With Tape After Cleansing: No brino No Exposed Structure osed: No (Subcutaneous Tissue) Exposed: No osed: No osed: No sed: No ed: No % Reduction in Area: 47.9% % Reduction in Volume: 47.9% Epithelialization: None Tunneling: No Undermining: No Foul Odor Slough/Fi Fascia Exp Fat Layer Tendon Exp Muscle Exp Joint Expo Bone Expos Notes netting Electronic Signature(s) Signed: 08/31/2019 4:12:00 PM By: Mikeal Hawthorne EMT/HBOT Signed:  08/31/2019 4:44:32 PM By: Deon Pilling Previous Signature: 08/26/2019 5:32:50 PM Version By: Deon Pilling Entered By: Mikeal Hawthorne on 08/31/2019 09:42:01 -------------------------------------------------------------------------------- Wound Assessment Details Patient Name: Date of Service: [name]. 08/26/2019 10:30 AM Medical Record [id number] Patient Account Number: [id number] Date of Birth/Sex: Treating RN: 1950-11-21 (69 y.o. Helene Shoe, Meta.Reding Primary Care Promise Bushong: [place] Constable Other Clinician: Referring Cabot Cromartie: Treating Jeselle Hiser/Extender:Madduri, Blanchard Mane, BAILEY Weeks in Treatment: 2 Wound Status Wound Number: 5 Primary Etiology: Venous Leg Ulcer Wound Location: Right Lower Leg - Circumferential Wound Status: Open Wounding Event: Blister Comorbid History: Anemia, Arrhythmia Date Acquired: 03/28/2019 Weeks Of Treatment: 2 Clustered Wound: No Photos Wound Measurements Length: (cm) 19.5 % Reduct Width: (cm) 32 % Reduct Depth: (cm) 0.3 Epitheli Area: (cm) 490.088 Tunneli Volume: (cm) 147.027 Undermi Wound Description Classification: Full Thickness Without Exposed Support Foul Odo Structures Slough/F Wound Distinct, outline attached Margin: Exudate Large Amount: Exudate Purulent Type: Exudate yellow, brown, green Color: Wound Bed Granulation Amount: Medium (34-66%) Granulation Quality: Pink Fascia E Necrotic Amount: Medium (34-66%) Fat Laye Necrotic Quality: Adherent Slough Tendon E Muscle E Joint Ex Bone Exp r After Cleansing: No ibrino Yes Exposed Structure xposed: No r (Subcutaneous Tissue) Exposed: Yes xposed: No xposed: No posed: No osed: No ion in Area: 41.8% ion in Volume: 12.7% alization: Small (1-33%) ng: No ning: No Treatment Notes Wound #5 (Right, Circumferential Lower Leg) 1. Cleanse With Wound Cleanser Soap and water 2. Periwound Care Moisturizing lotion 3. Primary Dressing Applied Calcium Alginate  Ag 4. Secondary Dressing ABD Pad Roll Gauze 5. Secured With Tape Notes Horticulturist, commercial) Signed: 08/31/2019 4:12:00 PM By: Mikeal Hawthorne EMT/HBOT Signed: 08/31/2019 4:44:32 PM By: Deon Pilling Previous Signature: 08/26/2019 5:32:50 PM Version By: Deon Pilling Entered By: Mikeal Hawthorne on 08/31/2019 09:43:15 -------------------------------------------------------------------------------- Wound Assessment Details Patient Name: Date of Service: [name]. 08/26/2019 10:30 AM Medical Record [id number] Patient Account Number: [id number] Date of Birth/Sex: Treating RN: February 03, 1951 (68 y.o. F) Deaton,  Bobbi Primary Care Pelagia Iacobucci: Luis Abed Other Clinician: Referring Douglass Dunshee: Treating Joslyne Marshburn/Extender:Madduri, Kenard Gower, BAILEY Weeks in Treatment: 2 Wound Status Wound Number: 6 Primary Etiology: Venous Leg Ulcer Wound Location: Left Lower Leg - Circumferential Wound Status: Open Wounding Event: Blister Comorbid History: Anemia, Arrhythmia Date Acquired: 03/28/2019 Weeks Of Treatment: 2 Clustered Wound: Yes Photos Wound Measurements Length: (cm) 18.5 % Reduc Width: (cm) 14 % Reduc Depth: (cm) 0.1 Epithel Clustered Quantity: 6 Tunneli Area: (cm) 203.418 Underm Volume: (cm) 20.342 Wound Description Classification: Full Thickness Without Exposed Support Foul O Structures Slough Wound Distinct, outline attached Margin: Exudate Large Amount: Exudate Serosanguineous Type: Exudate red, brown Color: Wound Bed Granulation Amount: Medium (34-66%) Granulation Quality: Pink Fascia Necrotic Amount: Medium (34-66%) Fat Lay Necrotic Quality: Adherent Slough Tendon Muscle Joint E Bone Ex dor After Cleansing: No /Fibrino Yes Exposed Structure Exposed: No er (Subcutaneous Tissue) Exposed: Yes Exposed: No Exposed: No xposed: No posed: No tion in Area: 65.9% tion in Volume: 82.9% ialization: Small (1-33%) ng: No ining:  No Treatment Notes Wound #6 (Left, Circumferential Lower Leg) 1. Cleanse With Wound Cleanser Soap and water 2. Periwound Care Moisturizing lotion 3. Primary Dressing Applied Calcium Alginate Ag 4. Secondary Dressing ABD Pad Roll Gauze 5. Secured With Tape Notes Government social research officer) Signed: 08/31/2019 4:12:00 PM By: Benjaman Kindler EMT/HBOT Signed: 08/31/2019 4:44:32 PM By: Shawn Stall Previous Signature: 08/26/2019 5:32:50 PM Version By: Shawn Stall Entered By: Benjaman Kindler on 08/31/2019 09:42:26 -------------------------------------------------------------------------------- Vitals Details Patient Name: Date of Service: [name]. 08/26/2019 10:30 AM Medical Record [id number] Patient Account Number: [id number] Date of Birth/Sex: Treating RN: 24-Oct-1950 (68 y.o. Debara Pickett, Millard.Loa Primary Care Tracey Stewart: Luis Abed Other Clinician: Referring Zemirah Krasinski: Treating Kenn Rekowski/Extender:Madduri, Kenard Gower, BAILEY Weeks in Treatment: 2 Vital Signs Time Taken: 10:29 Temperature (F): 99 Height (in): 66 Pulse (bpm): 75 Weight (lbs): 178 Respiratory Rate (breaths/min): 16 Body Mass Index (BMI): 28.7 Blood Pressure (mmHg): 113/51 Reference Range: 80 - 120 mg / dl Electronic Signature(s) Signed: 08/26/2019 5:32:50 PM By: Shawn Stall Entered By: Shawn Stall on 08/26/2019 10:30:02

## 2019-08-26 NOTE — Progress Notes (Signed)
[name], [name] ([id number]) Visit Report for 08/26/2019 Debridement Details Patient Name: Date of Service: [name], [name] 08/26/2019 10:30 AM Medical Record [id number] Patient Account Number: [id number] Date of Birth/Sex: Treating RN: 1950-11-21 (68 y.o. Tommye Standard Primary Care Provider: Luis Abed Other Clinician: Referring Provider: Luis Abed Treating Provider/Extender:Frenchie Dangerfield, Terrilee Files in Treatment: 2 Debridement Performed for Wound #5 Right,Circumferential Lower Leg Assessment: Performed By: Physician Cassandria Anger, MD Debridement Type: Debridement Severity of Tissue Pre Fat layer exposed Debridement: Level of Consciousness (Pre- Awake and Alert procedure): Pre-procedure Verification/Time Out Taken: Yes - 11:10 Start Time: 11:11 Total Area Debrided (L x W): 2 (cm) x 1 (cm) = 2 (cm) Tissue and other material Viable, Non-Viable, Slough, Subcutaneous, Slough debrided: Level: Skin/Subcutaneous Tissue Debridement Description: Excisional Instrument: Curette Bleeding: None End Time: 11:16 Procedural Pain: 4 Post Procedural Pain: 1 Response to Treatment: Procedure was tolerated well Level of Consciousness Awake and Alert (Post-procedure): Post Debridement Measurements of Total Wound Length: (cm) 19.5 Width: (cm) 32 Depth: (cm) 0.3 Volume: (cm) 147.027 Character of Wound/Ulcer Post Requires Further Debridement Debridement: Severity of Tissue Post Debridement: Fat layer exposed Post Procedure Diagnosis Same as Pre-procedure Electronic Signature(s) Signed: 08/26/2019 5:10:58 PM By: Cassandria Anger MD, MBA Signed: 08/26/2019 5:40:33 PM By: Zenaida Deed RN, BSN Entered By: Zenaida Deed on 08/26/2019 11:17:51 -------------------------------------------------------------------------------- HPI Details Patient Name: Date of Service: [name]. 08/26/2019 10:30 AM Medical Record [id number] Patient Account Number:  [id number] Date of Birth/Sex: Treating RN: [birth year] (68 y.o. F) Primary Care Provider: Luis Abed Other Clinician: Referring Provider: Treating Provider/Extender:Tien Spooner, Kenard Gower, BAILEY Weeks in Treatment: 2 History of Present Illness HPI Description: ADMISSION 03/03/18 This is a 68 year old woman who is here for review of wounds on her bilateral lower legs which she states of been there for 8 months. She is not able to give a really great history about how they started simply thinks that they showed up 1 day. She apparently has a history of weeping bilateral lower extremity edema probably chronic venous insufficiency plus or minus lymphedema. She was seen on 02/05/18 at her primary physician in [facility] family practice noted to have a large wound on the right leg a smaller wound on the left. She was given Unna boots and 10 days of Keflex. Previous to this she had been in the ER on several occasions in February for review of these wounds. The patient is not a diabetic however she does smoke. Past medical history includes atrial fibrillation, anemia, bipolar disorder, We were not able to get an ABI in our clinic today as the patient could not stand it. She comes into the clinic without anything on the wounds. She has advanced home care at home although I'm really not certain what they've been putting on this Readmission: 08/12/2019 on evaluation today patient actually presents for follow-up here in our clinic we have not seen her since actually last year which was July 8 of 2019. With that being said she is still having issues with bilateral lower extremity wounds. The right is definitely worse than the left but both are significant. She has pressure ulcers to both heels along with the open lymphedema/venous ulcers to her legs. Unfortunately she is having significant pain she also appears to have severe cellulitis in my opinion especially on the right which is extending all  the way up to her knee and seems to be tracking such from the wounds down below. There is no signs of obvious systemic infection such as sepsis but nonetheless  she has had this it seems like for quite some time. There definitely is some signs of dementia or at least not fully understanding exactly what is going on though I think the patient is able to tell me the truth about her pain I think she is in tremendous pain. She initially told us that she was not going to allow anybody to touch her. However we discussed with her that we did need to have a look at the wounds and we would have to do a few things in order to try to help her she was cooperative and actually did very well. She just had to be somewhat coached and what was going on and not just have something sprung on her. With that being said she is also very much able to lift up her legs to let me look at all the areas she did that on her own and was very helpful in that regard. 12/30-Patient is back after 2 weeks from the facility where she is getting care for her wounds but she says she does her own wound cleaning, she has pressure ulcers and venous ulcers to her legs that unfortunately have been nonhealing and progressive, she was hospitalized for 2 days with cellulitis on the right which is the worst side and sent out on Augmentin which she is completing. The wound on the right leg look pretty bad there is no significant inflammation noted in the surrounding skin Electronic Signature(s) Signed: 08/26/2019 11:26:17 AM By: Cassandria AngerMadduri, Lamin Chandley MD, MBA Entered By: Cassandria AngerMadduri, Breunna Nordmann on 08/26/2019 11:26:16 -------------------------------------------------------------------------------- Physical Exam Details Patient Name: Date of Service: Caroline HesselbachWELLS, Caroline M. 08/26/2019 10:30 AM Medical Record [id number] Patient Account Number: [id number] Date of Birth/Sex: Treating RN: 10/01/1950 (68 y.o. F) Primary Care Provider: Luis AbedMECCARIELLO, BAILEY Other  Clinician: Referring Provider: Treating Provider/Extender:Jaeden Westbay, Kenard GowerMurthy MECCARIELLO, BAILEY Weeks in Treatment: 2 Constitutional alert and oriented x 3. sitting or standing blood pressure is within target range for patient.. supine blood pressure is within target range for patient.. pulse regular and within target range for patient.Marland Kitchen. respirations regular, non-labored and within target range for patient.Marland Kitchen. temperature within target range for patient.. . . Well-nourished and well-hydrated in no acute distress. Notes Right leg ulceration that is circumferential extensive, areas especially 1 on the right anterior and one of the right posterior have some necrotic debris she allowed me to debride with a curette the anterior shin wound and I removed some necrotic debris in the posterior wound with some eschar she would not allow me to touch Left lower extremity has crusted areas and some open areas as well Electronic Signature(s) Signed: 08/26/2019 11:27:33 AM By: Cassandria AngerMadduri, Kendallyn Lippold MD, MBA Previous Signature: 08/26/2019 11:27:07 AM Version By: Cassandria AngerMadduri, Penina Reisner MD, MBA Entered By: Cassandria AngerMadduri, Delfin Squillace on 08/26/2019 11:27:32 -------------------------------------------------------------------------------- Physician Orders Details Patient Name: Date of Service: Caroline HesselbachWELLS, Caroline M. 08/26/2019 10:30 AM Medical Record [id number] Patient Account Number: [id number] Date of Birth/Sex: Treating RN: 03/29/1951 (68 y.o. Tommye StandardF) Boehlein, Linda Primary Care Provider: Luis AbedMECCARIELLO, BAILEY Other Clinician: Referring Provider: Treating Provider/Extender:Kaveon Blatz, Kenard GowerMurthy MECCARIELLO, BAILEY Weeks in Treatment: 2 Verbal / Phone Orders: No Diagnosis Coding ICD-10 Coding Code Description I89.0 Lymphedema, not elsewhere classified L97.822 Non-pressure chronic ulcer of other part of left lower leg with fat layer exposed L97.812 Non-pressure chronic ulcer of other part of right lower leg with fat layer exposed L89.620  Pressure ulcer of left heel, unstageable L89.610 Pressure ulcer of right heel, unstageable Follow-up Appointments Return Appointment in 2 weeks. Dressing Change Frequency Change dressing three times week.  Skin Barriers/Peri-Wound Care Barrier cream - to periwounds as needed Moisturizing lotion - to dry skin Wound Cleansing Clean wound with Normal Saline. Primary Wound Dressing Wound #3 Right Calcaneus Calcium Alginate with Silver Wound #4 Left,Lateral Calcaneus Calcium Alginate with Silver Wound #5 Right,Circumferential Lower Leg Calcium Alginate with Silver Wound #6 Left,Circumferential Lower Leg Calcium Alginate with Silver Secondary Dressing Wound #3 Right Calcaneus Kerlix/Rolled Gauze Wound #4 Left,Lateral Calcaneus Kerlix/Rolled Gauze Wound #5 Right,Circumferential Lower Leg Kerlix/Rolled Gauze Wound #6 Left,Circumferential Lower Leg Kerlix/Rolled Gauze Edema Control Avoid standing for long periods of time Elevate legs to the level of the heart or above for 30 minutes daily and/or when sitting, a frequency of: - thoughout the day Exercise regularly Off-Loading Turn and reposition every 2 hours Other: - float heels off bed with pillows under calves Electronic Signature(s) Signed: 08/26/2019 5:10:58 PM By: Cassandria Anger MD, MBA Signed: 08/26/2019 5:40:33 PM By: Zenaida Deed RN, BSN Entered By: Zenaida Deed on 08/26/2019 11:21:36 -------------------------------------------------------------------------------- Problem List Details Patient Name: Date of Service: [name]. 08/26/2019 10:30 AM Medical Record [id number] Patient Account Number: [id number] Date of Birth/Sex: Treating RN: 09/24/1950 (68 y.o. Tommye Standard Primary Care Provider: Luis Abed Other Clinician: Referring Provider: Treating Provider/Extender:Terris Germano, Kenard Gower, BAILEY Weeks in Treatment: 2 Active Problems ICD-10 Evaluated Encounter Code Description  Active Date Today Diagnosis I89.0 Lymphedema, not elsewhere classified 08/12/2019 No Yes L97.822 Non-pressure chronic ulcer of other part of left lower 08/12/2019 No Yes leg with fat layer exposed L97.812 Non-pressure chronic ulcer of other part of right lower 08/12/2019 No Yes leg with fat layer exposed L89.620 Pressure ulcer of left heel, unstageable 08/12/2019 No Yes L89.610 Pressure ulcer of right heel, unstageable 08/12/2019 No Yes Inactive Problems Resolved Problems Electronic Signature(s) Signed: 08/26/2019 5:10:58 PM By: Cassandria Anger MD, MBA Signed: 08/26/2019 5:40:33 PM By: Zenaida Deed RN, BSN Entered By: Zenaida Deed on 08/26/2019 11:15:42 -------------------------------------------------------------------------------- Progress Note Details Patient Name: Date of Service: [name]. 08/26/2019 10:30 AM Medical Record [id number] Patient Account Number: [id number] Date of Birth/Sex: Treating RN: 01-20-1951 (68 y.o. F) Primary Care Provider: Luis Abed Other Clinician: Referring Provider: Treating Provider/Extender:Shaindy Reader, Kenard Gower, BAILEY Weeks in Treatment: 2 Subjective History of Present Illness (HPI) ADMISSION 03/03/18 This is a 68 year old woman who is here for review of wounds on her bilateral lower legs which she states of been there for 8 months. She is not able to give a really great history about how they started simply thinks that they showed up 1 day. She apparently has a history of weeping bilateral lower extremity edema probably chronic venous insufficiency plus or minus lymphedema. She was seen on 02/05/18 at her primary physician in [facility] family practice noted to have a large wound on the right leg a smaller wound on the left. She was given Unna boots and 10 days of Keflex. Previous to this she had been in the ER on several occasions in February for review of these wounds. The patient is not a diabetic however she does  smoke. Past medical history includes atrial fibrillation, anemia, bipolar disorder, We were not able to get an ABI in our clinic today as the patient could not stand it. She comes into the clinic without anything on the wounds. She has advanced home care at home although I'm really not certain what they've been putting on this Readmission: 08/12/2019 on evaluation today patient actually presents for follow-up here in our clinic we have not seen her since actually last year which was July  8 of 2019. With that being said she is still having issues with bilateral lower extremity wounds. The right is definitely worse than the left but both are significant. She has pressure ulcers to both heels along with the open lymphedema/venous ulcers to her legs. Unfortunately she is having significant pain she also appears to have severe cellulitis in my opinion especially on the right which is extending all the way up to her knee and seems to be tracking such from the wounds down below. There is no signs of obvious systemic infection such as sepsis but nonetheless she has had this it seems like for quite some time. There definitely is some signs of dementia or at least not fully understanding exactly what is going on though I think the patient is able to tell me the truth about her pain I think she is in tremendous pain. She initially told us that she was not going to allow anybody to touch her. However we discussed with her that we did need to have a look at the wounds and we would have to do a few things in order to try to help her she was cooperative and actually did very well. She just had to be somewhat coached and what was going on and not just have something sprung on her. With that being said she is also very much able to lift up her legs to let me look at all the areas she did that on her own and was very helpful in that regard. 12/30-Patient is back after 2 weeks from the facility where she is getting  care for her wounds but she says she does her own wound cleaning, she has pressure ulcers and venous ulcers to her legs that unfortunately have been nonhealing and progressive, she was hospitalized for 2 days with cellulitis on the right which is the worst side and sent out on Augmentin which she is completing. The wound on the right leg look pretty bad there is no significant inflammation noted in the surrounding skin Objective Constitutional alert and oriented x 3. sitting or standing blood pressure is within target range for patient.. supine blood pressure is within target range for patient.. pulse regular and within target range for patient.Marland Kitchen respirations regular, non-labored and within target range for patient.Marland Kitchen temperature within target range for patient.. Well-nourished and well-hydrated in no acute distress. Vitals Time Taken: 10:29 AM, Height: 66 in, Weight: 178 lbs, BMI: 28.7, Temperature: 99 F, Pulse: 75 bpm, Respiratory Rate: 16 breaths/min, Blood Pressure: 113/51 mmHg. General Notes: Right leg ulceration that is circumferential extensive, areas especially 1 on the right anterior and one of the right posterior have some necrotic debris she allowed me to debride with a curette the anterior shin wound and I removed some necrotic debris in the posterior wound with some eschar she would not allow me to touch Left lower extremity has crusted areas and some open areas as well Integumentary (Hair, Skin) Wound #3 status is Open. Original cause of wound was Not Known. The wound is located on the Right Calcaneus. The wound measures 1.4cm length x 2cm width x 0.2cm depth; 2.199cm^2 area and 0.44cm^3 volume. There is no tunneling or undermining noted. There is a medium amount of purulent drainage noted. The wound margin is distinct with the outline attached to the wound base. There is large (67-100%) pink granulation within the wound bed. There is a small (1-33%) amount of necrotic tissue  within the wound bed including Adherent Slough. Wound #4  status is Open. Original cause of wound was Not Known. The wound is located on the Left,Lateral Calcaneus. The wound measures 2.5cm length x 2.5cm width x 0.1cm depth; 4.909cm^2 area and 0.491cm^3 volume. There is no tunneling or undermining noted. There is a none present amount of drainage noted. The wound margin is distinct with the outline attached to the wound base. There is no granulation within the wound bed. There is a large (67-100%) amount of necrotic tissue within the wound bed including Eschar. Wound #5 status is Open. Original cause of wound was Blister. The wound is located on the Right,Circumferential Lower Leg. The wound measures 19.5cm length x 32cm width x 0.3cm depth; 490.088cm^2 area and 147.027cm^3 volume. There is Fat Layer (Subcutaneous Tissue) Exposed exposed. There is no tunneling or undermining noted. There is a large amount of purulent drainage noted. The wound margin is distinct with the outline attached to the wound base. There is medium (34-66%) pink granulation within the wound bed. There is a medium (34-66%) amount of necrotic tissue within the wound bed including Adherent Slough. Wound #6 status is Open. Original cause of wound was Blister. The wound is located on the Left,Circumferential Lower Leg. The wound measures 18.5cm length x 14cm width x 0.1cm depth; 203.418cm^2 area and 20.342cm^3 volume. There is Fat Layer (Subcutaneous Tissue) Exposed exposed. There is no tunneling or undermining noted. There is a large amount of serosanguineous drainage noted. The wound margin is distinct with the outline attached to the wound base. There is medium (34-66%) pink granulation within the wound bed. There is a medium (34-66%) amount of necrotic tissue within the wound bed including Adherent Slough. Assessment Active Problems ICD-10 Lymphedema, not elsewhere classified Non-pressure chronic ulcer of other part of  left lower leg with fat layer exposed Non-pressure chronic ulcer of other part of right lower leg with fat layer exposed Pressure ulcer of left heel, unstageable Pressure ulcer of right heel, unstageable Procedures Wound #5 Pre-procedure diagnosis of Wound #5 is a Venous Leg Ulcer located on the Right,Circumferential Lower Leg .Severity of Tissue Pre Debridement is: Fat layer exposed. There was a Excisional Skin/Subcutaneous Tissue Debridement with a total area of 2 sq cm performed by Tobi Bastos, MD. With the following instrument(s): Curette to remove Viable and Non-Viable tissue/material. Material removed includes Subcutaneous Tissue and Slough and. No specimens were taken. A time out was conducted at 11:10, prior to the start of the procedure. There was no bleeding. The procedure was tolerated well with a pain level of 4 throughout and a pain level of 1 following the procedure. Post Debridement Measurements: 19.5cm length x 32cm width x 0.3cm depth; 147.027cm^3 volume. Character of Wound/Ulcer Post Debridement requires further debridement. Severity of Tissue Post Debridement is: Fat layer exposed. Post procedure Diagnosis Wound #5: Same as Pre-Procedure Plan Follow-up Appointments: Return Appointment in 2 weeks. Dressing Change Frequency: Change dressing three times week. Skin Barriers/Peri-Wound Care: Barrier cream - to periwounds as needed Moisturizing lotion - to dry skin Wound Cleansing: Clean wound with Normal Saline. Primary Wound Dressing: Wound #3 Right Calcaneus: Calcium Alginate with Silver Wound #4 Left,Lateral Calcaneus: Calcium Alginate with Silver Wound #5 Right,Circumferential Lower Leg: Calcium Alginate with Silver Wound #6 Left,Circumferential Lower Leg: Calcium Alginate with Silver Secondary Dressing: Wound #3 Right Calcaneus: Kerlix/Rolled Gauze Wound #4 Left,Lateral Calcaneus: Kerlix/Rolled Gauze Wound #5 Right,Circumferential Lower  Leg: Kerlix/Rolled Gauze Wound #6 Left,Circumferential Lower Leg: Kerlix/Rolled Gauze Edema Control: Avoid standing for long periods of time Elevate legs to the level  of the heart or above for 30 minutes daily and/or when sitting, a frequency of: - thoughout the day Exercise regularly Off-Loading: Turn and reposition every 2 hours Other: - float heels off bed with pillows under calves 1. Continue using silver alginate to the wounds, patient dictates her own wound care 2. Continue dressing with Kerlix and gauze may be using an Ace wrap may be helpful to if she will tolerated 3. Keep legs elevated and reposition float heels off bed 4. Return to clinic 2 weeks Electronic Signature(s) Signed: 08/26/2019 11:28:45 AM By: Cassandria Anger MD, MBA Entered By: Cassandria Anger on 08/26/2019 11:28:45 -------------------------------------------------------------------------------- SuperBill Details Patient Name: Date of Service: [name], [name] 08/26/2019 Medical Record [id number] Patient Account Number: [id number] Date of Birth/Sex: Treating RN: 26-Apr-1951 (68 y.o. Tommye Standard Primary Care Provider: Luis Abed Other Clinician: Referring Provider: Treating Provider/Extender:Mariene Dickerman, Kenard Gower, BAILEY Weeks in Treatment: 2 Diagnosis Coding ICD-10 Codes Code Description I89.0 Lymphedema, not elsewhere classified L97.822 Non-pressure chronic ulcer of other part of left lower leg with fat layer exposed L97.812 Non-pressure chronic ulcer of other part of right lower leg with fat layer exposed L89.620 Pressure ulcer of left heel, unstageable L89.610 Pressure ulcer of right heel, unstageable Facility Procedures CPT4 Code Description: [id number] 11042 - DEB SUBQ TISSUE 20 SQ CM/< ICD-10 Diagnosis Description L97.812 Non-pressure chronic ulcer of other part of right lower leg Modifier: with fat lay Quantity: 1 er exposed Physician Procedures CPT4 Code Description:  [id number] 11042 - WC PHYS SUBQ TISS 20 SQ CM ICD-10 Diagnosis Description L97.812 Non-pressure chronic ulcer of other part of right lower leg Modifier: with fat lay Quantity: 1 er exposed Electronic Signature(s) Signed: 08/26/2019 11:28:57 AM By: Cassandria Anger MD, MBA Entered By: Cassandria Anger on 08/26/2019 11:28:56

## 2019-09-09 ENCOUNTER — Encounter (HOSPITAL_BASED_OUTPATIENT_CLINIC_OR_DEPARTMENT_OTHER): Payer: Medicare Other | Attending: Physician Assistant | Admitting: Physician Assistant

## 2019-09-09 ENCOUNTER — Other Ambulatory Visit: Payer: Self-pay

## 2019-09-09 DIAGNOSIS — F319 Bipolar disorder, unspecified: Secondary | ICD-10-CM | POA: Diagnosis not present

## 2019-09-09 DIAGNOSIS — L97812 Non-pressure chronic ulcer of other part of right lower leg with fat layer exposed: Secondary | ICD-10-CM | POA: Diagnosis not present

## 2019-09-09 DIAGNOSIS — L89610 Pressure ulcer of right heel, unstageable: Secondary | ICD-10-CM | POA: Diagnosis not present

## 2019-09-09 DIAGNOSIS — L03115 Cellulitis of right lower limb: Secondary | ICD-10-CM | POA: Insufficient documentation

## 2019-09-09 DIAGNOSIS — L97822 Non-pressure chronic ulcer of other part of left lower leg with fat layer exposed: Secondary | ICD-10-CM | POA: Diagnosis present

## 2019-09-09 DIAGNOSIS — I4891 Unspecified atrial fibrillation: Secondary | ICD-10-CM | POA: Insufficient documentation

## 2019-09-09 DIAGNOSIS — R609 Edema, unspecified: Secondary | ICD-10-CM | POA: Insufficient documentation

## 2019-09-09 DIAGNOSIS — L89620 Pressure ulcer of left heel, unstageable: Secondary | ICD-10-CM | POA: Insufficient documentation

## 2019-09-09 DIAGNOSIS — I890 Lymphedema, not elsewhere classified: Secondary | ICD-10-CM | POA: Diagnosis not present

## 2019-09-09 NOTE — Progress Notes (Addendum)
Caroline, Phillips ([id number]) Visit Report for 09/09/2019 Chief Complaint Document Details Patient Name: Date of Service: [name], [name] 09/09/2019 10:30 AM Medical Record [id number] Patient Account Number: [id number] Date of Birth/Sex: Treating RN: 18-Aug-1951 (69 y.o. F) Primary Care Provider: Luis Abed Other Clinician: Referring Provider: Treating Provider/Extender:Stone III, Lanier Ensign, BAILEY Weeks in Treatment: 4 Information Obtained from: Patient Chief Complaint Bilateral LE and bilateral heel pressure ulcers Electronic Signature(s) Signed: 09/09/2019 10:40:53 AM By: Lenda Kelp PA-C Entered By: Lenda Kelp on 09/09/2019 10:40:53 -------------------------------------------------------------------------------- HPI Details Patient Name: Date of Service: [name]. 09/09/2019 10:30 AM Medical Record [id number] Patient Account Number: [id number] Date of Birth/Sex: Treating RN: Jan 21, 1951 (69 y.o. F) Primary Care Provider: Luis Abed Other Clinician: Referring Provider: Treating Provider/Extender:Stone III, Lanier Ensign, BAILEY Weeks in Treatment: 4 History of Present Illness HPI Description: ADMISSION 03/03/18 This is a 69 year old woman who is here for review of wounds on her bilateral lower legs which she states of been there for 8 months. She is not able to give a really great history about how they started simply thinks that they showed up 1 day. She apparently has a history of weeping bilateral lower extremity edema probably chronic venous insufficiency plus or minus lymphedema. She was seen on 02/05/18 at her primary physician in [facility] noted to have a large wound on the right leg a smaller wound on the left. She was given Unna boots and 10 days of Keflex. Previous to this she had been in the ER on several occasions in February for review of these wounds. The patient is not a diabetic however she does  smoke. Past medical history includes atrial fibrillation, anemia, bipolar disorder, We were not able to get an ABI in our clinic today as the patient could not stand it. She comes into the clinic without anything on the wounds. She has advanced home care at home although I'm really not certain what they've been putting on this Readmission: 08/12/2019 on evaluation today patient actually presents for follow-up here in our clinic we have not seen her since actually last year which was July 8 of 2019. With that being said she is still having issues with bilateral lower extremity wounds. The right is definitely worse than the left but both are significant. She has pressure ulcers to both heels along with the open lymphedema/venous ulcers to her legs. Unfortunately she is having significant pain she also appears to have severe cellulitis in my opinion especially on the right which is extending all the way up to her knee and seems to be tracking such from the wounds down below. There is no signs of obvious systemic infection such as sepsis but nonetheless she has had this it seems like for quite some time. There definitely is some signs of dementia or at least not fully understanding exactly what is going on though I think the patient is able to tell me the truth about her pain I think she is in tremendous pain. She initially told us that she was not going to allow anybody to touch her. However we discussed with her that we did need to have a look at the wounds and we would have to do a few things in order to try to help her she was cooperative and actually did very well. She just had to be somewhat coached and what was going on and not just have something sprung on her. With that being said she is also very much able  to lift up her legs to let me look at all the areas she did that on her own and was very helpful in that regard. 12/30-Patient is back after 2 weeks from the facility where she is getting  care for her wounds but she says she does her own wound cleaning, she has pressure ulcers and venous ulcers to her legs that unfortunately have been nonhealing and progressive, she was hospitalized for 2 days with cellulitis on the right which is the worst side and sent out on Augmentin which she is completing. The wound on the right leg look pretty bad there is no significant inflammation noted in the surrounding skin 09/09/2019 on evaluation today patient unfortunately continues to have significant pain in regard to her lower extremities. She has been tolerating dressing changes without complication. Fortunately there is no signs of active infection at this time. With that being said she has really somewhat poor blood flow in regard to her lower extremities. She has significant edema especially on the right but even on the left she is experiencing this as well. Unfortunately overall I am very concerned about the fact that this may not be something that actually can heal. Electronic Signature(s) Signed: 09/09/2019 12:07:18 PM By: Lenda KelpStone III, Chari Parmenter PA-C Entered By: Lenda KelpStone III, Keanu Lesniak on 09/09/2019 12:07:18 -------------------------------------------------------------------------------- Physical Exam Details Patient Name: Date of Service: Caroline HesselbachWELLS, Tamela M. 09/09/2019 10:30 AM Medical Record [id number] Patient Account Number: [id number] Date of Birth/Sex: Treating RN: 01/18/1951 (69 y.o. F) Primary Care Provider: Luis AbedMECCARIELLO, BAILEY Other Clinician: Referring Provider: Treating Provider/Extender:Stone III, Lanier EnsignHoyt MECCARIELLO, BAILEY Weeks in Treatment: 4 Constitutional Well-nourished and well-hydrated in no acute distress. Respiratory normal breathing without difficulty. Psychiatric this patient is able to make decisions and demonstrates good insight into disease process. Alert and Oriented x 3. pleasant and cooperative. Notes Upon inspection today patient's wound bed actually showed signs of  very poor healing and granulation with significant slough buildup in general throughout her lower extremities. Unfortunately I feel like she has poor blood flow into her lower extremities at this time therefore were really not able to significantly compress her legs. I think she might be able to tolerate just a very lightly wrapped Kerlix with Coban along with using the alginate I think that would be ideal. With that being said I am not sure that she is even good to be able to tolerate that to be honest. Electronic Signature(s) Signed: 09/09/2019 12:08:20 PM By: Lenda KelpStone III, Naela Nodal PA-C Entered By: Lenda KelpStone III, Ott Zimmerle on 09/09/2019 12:08:19 -------------------------------------------------------------------------------- Physician Orders Details Patient Name: Date of Service: Caroline HesselbachWELLS, Athira M. 09/09/2019 10:30 AM Medical Record [id number] Patient Account Number: [id number] Date of Birth/Sex: Treating RN: 05/27/1951 ([age] y.o. 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 Dressing Change Frequency Change dressing three times week. Skin Barriers/Peri-Wound Care Barrier cream - to periwounds as needed Moisturizing lotion - to dry skin Wound Cleansing Clean wound with Normal Saline. Primary Wound Dressing Wound #3 Right Calcaneus Calcium Alginate with Silver Wound #4 Left,Lateral Calcaneus Calcium Alginate with  Silver Wound #5 Right,Circumferential Lower Leg Calcium Alginate with Silver Wound #6 Left,Circumferential Lower Leg Calcium Alginate with Silver Secondary Dressing Wound #3 Right Calcaneus Kerlix/Rolled Gauze Dry Gauze Heel Cup Wound #4 Left,Lateral Calcaneus Kerlix/Rolled Gauze Dry Gauze Heel Cup Wound #5 Right,Circumferential Lower Leg ABD pad Wound #6 Left,Circumferential Lower Leg Kerlix/Rolled Gauze ABD pad Edema Control Kerlix and Coban - Bilateral - lightly- not too tight if tolerates Avoid standing for long periods of time Elevate legs to the level of the heart or above for 30 minutes daily and/or when sitting, a frequency of: - thoughout the day Exercise regularly Off-Loading Turn and reposition every 2 hours Other: - float heels off bed with pillows under calves Electronic Signature(s) Signed: 09/09/2019 5:55:19 PM By: Worthy Keeler PA-C Signed: 09/09/2019 6:34:17 PM By: Baruch Gouty RN, BSN Entered By: Baruch Gouty on 09/09/2019 12:04:15 -------------------------------------------------------------------------------- Problem List Details Patient Name: Date of Service: [name]. 09/09/2019 10:30 AM Medical Record [id number] Patient Account Number: [id number] Date of Birth/Sex: Treating RN: [birth year] (69 y.o. F) Primary Care Provider: [place] Constable Other Clinician: Referring Provider: Treating Provider/Extender:Stone III, Nancie Neas, BAILEY Weeks in Treatment: 4 Active Problems ICD-10 Evaluated Encounter Code Description Active Date Today Diagnosis I89.0 Lymphedema, not elsewhere classified 08/12/2019 No Yes L97.822 Non-pressure chronic ulcer of other part of left lower 08/12/2019 No Yes leg with fat layer exposed L97.812 Non-pressure chronic ulcer of other part of right lower 08/12/2019 No Yes leg with fat layer exposed L89.620 Pressure ulcer of left heel, unstageable 08/12/2019 No Yes L89.610 Pressure ulcer of right heel,  unstageable 08/12/2019 No Yes Inactive Problems Resolved Problems Electronic Signature(s) Signed: 09/09/2019 10:40:48 AM By: Worthy Keeler PA-C Entered By: Worthy Keeler on 09/09/2019 10:40:47 -------------------------------------------------------------------------------- Progress Note Details Patient Name: Date of Service: [name]. 09/09/2019 10:30 AM Medical Record [id number] Patient Account Number: [id number] Date of Birth/Sex: Treating RN: July 17, 1951 (69 y.o. F) Primary Care Provider: [place] Constable Other Clinician: Referring Provider: Treating Provider/Extender:Stone III, Nancie Neas, BAILEY Weeks in Treatment: 4 Subjective Chief Complaint Information obtained from Patient Bilateral LE and bilateral heel pressure ulcers History of Present Illness (HPI) ADMISSION 03/03/18 This is a 69 year old woman who is here for review of wounds on her bilateral lower legs which she states of been there for 8 months. She is not able to give a really great history about how they started simply thinks that they showed up 1 day. She apparently has a history of weeping bilateral lower extremity edema probably chronic venous insufficiency plus or minus lymphedema. She was seen on 02/05/18 at her primary physician in [facility] family practice noted to have a large wound on the right leg a smaller wound on the left. She was given Unna boots and 10 days of Keflex. Previous to this she had been in the ER on several occasions in February for review of these wounds. The patient is not a diabetic however she does smoke. Past medical history includes atrial fibrillation, anemia, bipolar disorder, We were not able to get an ABI in our clinic today as the patient could not stand it. She comes into the clinic without anything on the wounds. She has advanced home care at home although I'm really not certain what they've been putting on this Readmission: 08/12/2019 on evaluation today  patient actually presents for follow-up here in our clinic we have not seen her since actually last year which was July 8 of 2019. With that being said she is still having issues with bilateral  lower extremity wounds. The right is definitely worse than the left but both are significant. She has pressure ulcers to both heels along with the open lymphedema/venous ulcers to her legs. Unfortunately she is having significant pain she also appears to have severe cellulitis in my opinion especially on the right which is extending all the way up to her knee and seems to be tracking such from the wounds down below. There is no signs of obvious systemic infection such as sepsis but nonetheless she has had this it seems like for quite some time. There definitely is some signs of dementia or at least not fully understanding exactly what is going on though I think the patient is able to tell me the truth about her pain I think she is in tremendous pain. She initially told us that she was not going to allow anybody to touch her. However we discussed with her that we did need to have a look at the wounds and we would have to do a few things in order to try to help her she was cooperative and actually did very well. She just had to be somewhat coached and what was going on and not just have something sprung on her. With that being said she is also very much able to lift up her legs to let me look at all the areas she did that on her own and was very helpful in that regard. 12/30-Patient is back after 2 weeks from the facility where she is getting care for her wounds but she says she does her own wound cleaning, she has pressure ulcers and venous ulcers to her legs that unfortunately have been nonhealing and progressive, she was hospitalized for 2 days with cellulitis on the right which is the worst side and sent out on Augmentin which she is completing. The wound on the right leg look pretty bad there is no  significant inflammation noted in the surrounding skin 09/09/2019 on evaluation today patient unfortunately continues to have significant pain in regard to her lower extremities. She has been tolerating dressing changes without complication. Fortunately there is no signs of active infection at this time. With that being said she has really somewhat poor blood flow in regard to her lower extremities. She has significant edema especially on the right but even on the left she is experiencing this as well. Unfortunately overall I am very concerned about the fact that this may not be something that actually can heal. Objective Constitutional Well-nourished and well-hydrated in no acute distress. Vitals Time Taken: 11:07 AM, Height: 66 in, Weight: 178 lbs, BMI: 28.7, Temperature: 98.2 F, Pulse: 72 bpm, Respiratory Rate: 16 breaths/min, Blood Pressure: 141/42 mmHg. Respiratory normal breathing without difficulty. Psychiatric this patient is able to make decisions and demonstrates good insight into disease process. Alert and Oriented x 3. pleasant and cooperative. General Notes: Upon inspection today patient's wound bed actually showed signs of very poor healing and granulation with significant slough buildup in general throughout her lower extremities. Unfortunately I feel like she has poor blood flow into her lower extremities at this time therefore were really not able to significantly compress her legs. I think she might be able to tolerate just a very lightly wrapped Kerlix with Coban along with using the alginate I think that would be ideal. With that being said I am not sure that she is even good to be able to tolerate that to be honest. Integumentary (Hair, Skin) Wound #3 status is Open.  Original cause of wound was Not Known. The wound is located on the Right Calcaneus. The wound measures 1.3cm length x 2cm width x 0.1cm depth; 2.042cm^2 area and 0.204cm^3 volume. There is no tunneling or  undermining noted. There is a medium amount of purulent drainage noted. The wound margin is distinct with the outline attached to the wound base. There is small (1-33%) pink granulation within the wound bed. There is a large (67-100%) amount of necrotic tissue within the wound bed including Adherent Slough. Wound #4 status is Open. Original cause of wound was Not Known. The wound is located on the Left,Lateral Calcaneus. The wound measures 2cm length x 2.3cm width x 0.1cm depth; 3.613cm^2 area and 0.361cm^3 volume. There is no tunneling or undermining noted. There is a medium amount of purulent drainage noted. The wound margin is distinct with the outline attached to the wound base. There is no granulation within the wound bed. There is a large (67-100%) amount of necrotic tissue within the wound bed including Eschar and Adherent Slough. Wound #5 status is Open. Original cause of wound was Blister. The wound is located on the Right,Circumferential Lower Leg. The wound measures 22.5cm length x 26.5cm width x 0.3cm depth; 468.294cm^2 area and 140.488cm^3 volume. There is Fat Layer (Subcutaneous Tissue) Exposed exposed. There is no tunneling or undermining noted. There is a large amount of purulent drainage noted. The wound margin is distinct with the outline attached to the wound base. There is small (1-33%) pink granulation within the wound bed. There is a large (67- 100%) amount of necrotic tissue within the wound bed including Adherent Slough. Wound #6 status is Open. Original cause of wound was Blister. The wound is located on the Left,Circumferential Lower Leg. The wound measures 12.5cm length x 16.5cm width x 0.2cm depth; 161.988cm^2 area and 32.398cm^3 volume. There is Fat Layer (Subcutaneous Tissue) Exposed exposed. There is no tunneling or undermining noted. There is a large amount of purulent drainage noted. The wound margin is distinct with the outline attached to the wound base. There is  small (1-33%) pink granulation within the wound bed. There is a large (67-100%) amount of necrotic tissue within the wound bed including Adherent Slough. Assessment Active Problems ICD-10 Lymphedema, not elsewhere classified Non-pressure chronic ulcer of other part of left lower leg with fat layer exposed Non-pressure chronic ulcer of other part of right lower leg with fat layer exposed Pressure ulcer of left heel, unstageable Pressure ulcer of right heel, unstageable Plan Follow-up Appointments: Return Appointment in 2 weeks. Dressing Change Frequency: Change dressing three times week. Skin Barriers/Peri-Wound Care: Barrier cream - to periwounds as needed Moisturizing lotion - to dry skin Wound Cleansing: Clean wound with Normal Saline. Primary Wound Dressing: Wound #3 Right Calcaneus: Calcium Alginate with Silver Wound #4 Left,Lateral Calcaneus: Calcium Alginate with Silver Wound #5 Right,Circumferential Lower Leg: Calcium Alginate with Silver Wound #6 Left,Circumferential Lower Leg: Calcium Alginate with Silver Secondary Dressing: Wound #3 Right Calcaneus: Kerlix/Rolled Gauze Dry Gauze Heel Cup Wound #4 Left,Lateral Calcaneus: Kerlix/Rolled Gauze Dry Gauze Heel Cup Wound #5 Right,Circumferential Lower Leg: ABD pad Wound #6 Left,Circumferential Lower Leg: Kerlix/Rolled Gauze ABD pad Edema Control: Kerlix and Coban - Bilateral - lightly- not too tight if tolerates Avoid standing for long periods of time Elevate legs to the level of the heart or above for 30 minutes daily and/or when sitting, a frequency of: - thoughout the day Exercise regularly Off-Loading: Turn and reposition every 2 hours Other: - float heels off bed  with pillows under calves 1. Based on what I am seeing at this point I do think we can go ahead and try a very lightly wrapped Kerlix with Coban to secure in place. This does not need to be too tight at all but this will lose hold the dressings in  place and give him something to drain into. 2. I would recommend as well that we continue with the silver alginate dressing I think that is probably best I really do not want any additional moisture to the wound beds. 3. Patient has significant pain and therefore really does not allow much to be done as far as treatment is concerned at this point. Obviously I think that this is going to be very difficult area to heal. She is discussed with me multiple times now the fact that she may just want to have that leg amputated on the right. Nonetheless she really does not seem to want to proceed with actually going to see anybody about this currently. She just keeps talking about that if she has to go to the hospital and what they may have to do at that point. We will see patient back for reevaluation in 1 week here in the clinic. If anything worsens or changes patient will contact our office for additional recommendations. Electronic Signature(s) Signed: 09/09/2019 12:10:00 PM By: Lenda Kelp PA-C Entered By: Lenda Kelp on 09/09/2019 12:10:00 -------------------------------------------------------------------------------- SuperBill Details Patient Name: Date of Service: [name]. 09/09/2019 Medical Record [id number] Patient Account Number: [id number] Date of Birth/Sex: Treating RN: 1951/04/08 (69 y.o. Tommye Standard Primary Care Provider: Luis Abed Other Clinician: Referring Provider: Treating Provider/Extender:Stone III, Lanier Ensign, BAILEY Weeks in Treatment: 4 Diagnosis Coding ICD-10 Codes Code Description I89.0 Lymphedema, not elsewhere classified L97.822 Non-pressure chronic ulcer of other part of left lower leg with fat layer exposed L97.812 Non-pressure chronic ulcer of other part of right lower leg with fat layer exposed L89.620 Pressure ulcer of left heel, unstageable L89.610 Pressure ulcer of right heel, unstageable Facility Procedures CPT4  Code: [id number] Description: 96283 - WOUND CARE VISIT-LEV 5 EST PT Modifier: Quantity: 1 Physician Procedures CPT4 Code Description: [id number] - WC PHYS LEVEL 3 - EST PT ICD-10 Diagnosis Description I89.0 Lymphedema, not elsewhere classified L97.822 Non-pressure chronic ulcer of other part of left lower l L97.812 Non-pressure chronic ulcer of other part of  right lower L89.620 Pressure ulcer of left heel, unstageable Modifier: eg with fat lay leg with fat la Quantity: 1 er exposed yer exposed Electronic Signature(s) Signed: 09/09/2019 12:10:21 PM By: Lenda Kelp PA-C Entered By: Lenda Kelp on 09/09/2019 12:10:20

## 2019-09-23 ENCOUNTER — Other Ambulatory Visit: Payer: Self-pay

## 2019-09-23 ENCOUNTER — Encounter (HOSPITAL_BASED_OUTPATIENT_CLINIC_OR_DEPARTMENT_OTHER): Payer: Medicare Other | Admitting: Physician Assistant

## 2019-09-23 DIAGNOSIS — L97812 Non-pressure chronic ulcer of other part of right lower leg with fat layer exposed: Secondary | ICD-10-CM | POA: Diagnosis not present

## 2019-09-23 NOTE — Progress Notes (Addendum)
[name], Caroline M. ([id number]) Visit Report for 09/23/2019 Chief Complaint Document Details Patient Name: Date of Service: [name], Caroline M. 09/23/2019 9:30 AM Medical Record [id number] Patient Account Number: [id number] Date of Birth/Sex: Treating RN: 05/12/1951 (69 y.o. F) Primary Care Provider: Luis AbedMECCARIELLO, BAILEY Other Clinician: Referring Provider: Treating Provider/Extender:Stone III, Lanier EnsignHoyt MECCARIELLO, BAILEY Weeks in Treatment: 6 Information Obtained from: Patient Chief Complaint Bilateral LE and bilateral heel pressure ulcers Electronic Signature(s) Signed: 09/23/2019 9:53:01 AM By: Lenda KelpStone III, Sani Loiseau PA-C Entered By: Lenda KelpStone III, Carmita Boom on 09/23/2019 09:53:01 -------------------------------------------------------------------------------- HPI Details Patient Name: Date of Service: [name], Caroline M. 09/23/2019 9:30 AM Medical Record [id number] Patient Account Number: [id number] Date of Birth/Sex: Treating RN: 01/28/1951 (69 y.o. F) Primary Care Provider: Luis AbedMECCARIELLO, BAILEY Other Clinician: Referring Provider: Treating Provider/Extender:Stone III, Lanier EnsignHoyt MECCARIELLO, BAILEY Weeks in Treatment: 6 History of Present Illness HPI Description: ADMISSION 03/03/18 This is a 69 year old woman who is here for review of wounds on her bilateral lower legs which she states of been there for 8 months. She is not able to give a really great history about how they started simply thinks that they showed up 1 day. She apparently has a history of weeping bilateral lower extremity edema probably chronic venous insufficiency plus or minus lymphedema. She was seen on 02/05/18 at her primary physician in [facility] noted to have a large wound on the right leg a smaller wound on the left. She was given Unna boots and 10 days of Keflex. Previous to this she had been in the ER on several occasions in February for review of these wounds. The patient is not a diabetic however she does smoke. Past  medical history includes atrial fibrillation, anemia, bipolar disorder, We were not able to get an ABI in our clinic today as the patient could not stand it. She comes into the clinic without anything on the wounds. She has advanced home care at home although I'm really not certain what they've been putting on this Readmission: 08/12/2019 on evaluation today patient actually presents for follow-up here in our clinic we have not seen her since actually last year which was July 8 of 2019. With that being said she is still having issues with bilateral lower extremity wounds. The right is definitely worse than the left but both are significant. She has pressure ulcers to both heels along with the open lymphedema/venous ulcers to her legs. Unfortunately she is having significant pain she also appears to have severe cellulitis in my opinion especially on the right which is extending all the way up to her knee and seems to be tracking such from the wounds down below. There is no signs of obvious systemic infection such as sepsis but nonetheless she has had this it seems like for quite some time. There definitely is some signs of dementia or at least not fully understanding exactly what is going on though I think the patient is able to tell me the truth about her pain I think she is in tremendous pain. She initially told us that she was not going to allow anybody to touch her. However we discussed with her that we did need to have a look at the wounds and we would have to do a few things in order to try to help her she was cooperative and actually did very well. She just had to be somewhat coached and what was going on and not just have something sprung on her. With that being said she is also very much able  to lift up her legs to let me look at all the areas she did that on her own and was very helpful in that regard. 12/30-Patient is back after 2 weeks from the facility where she is getting care for her  wounds but she says she does her own wound cleaning, she has pressure ulcers and venous ulcers to her legs that unfortunately have been nonhealing and progressive, she was hospitalized for 2 days with cellulitis on the right which is the worst side and sent out on Augmentin which she is completing. The wound on the right leg look pretty bad there is no significant inflammation noted in the surrounding skin 09/09/2019 on evaluation today patient unfortunately continues to have significant pain in regard to her lower extremities. She has been tolerating dressing changes without complication. Fortunately there is no signs of active infection at this time. With that being said she has really somewhat poor blood flow in regard to her lower extremities. She has significant edema especially on the right but even on the left she is experiencing this as well. Unfortunately overall I am very concerned about the fact that this may not be something that actually can heal. 09/23/2019 on evaluation today patient unfortunately appears to be doing about the same. There is no signs of active systemic infection although she has significant wounds. I did question whether or not we should do a culture today of the wound area on the right lower extremity. The patient however does not want to do any cultures at this point. Last week we actually did recommend a light Coban over top of the con form to be performed at the facility as well that was actually 2 weeks ago when I saw her. Nonetheless they did not do that the doctor the facility change the orders because apparently she was crying out too much when they were trying to put on the Coban. Overall she does not seem to be doing too well in my opinion. Things again are about the same but at the same time she still is talking about the possibility of amputation. Electronic Signature(s) Signed: 09/23/2019 10:41:39 AM By: Worthy Keeler PA-C Entered By: Worthy Keeler on  09/23/2019 10:41:38 -------------------------------------------------------------------------------- Physical Exam Details Patient Name: Date of Service: [name], [name] 09/23/2019 9:30 AM Medical Record [id number] Patient Account Number: [id number] Date of Birth/Sex: Treating RN: 1950-10-10 (69 y.o. F) Primary Care Provider: [place] Constable Other Clinician: Referring Provider: Treating Provider/Extender:Stone III, Nancie Neas, BAILEY Weeks in Treatment: 6 Constitutional Well-nourished and well-hydrated in no acute distress. Respiratory normal breathing without difficulty. Psychiatric this patient is able to make decisions and demonstrates good insight into disease process. Alert and Oriented x 3. pleasant and cooperative. Notes Patient's wounds unfortunately show significant slough buildup on the surface of the wounds unfortunately. With that being said she does not want me to perform any culture at this time. She also does not really want the Coban apparently she was crying out at the facility when they were trying to put this on and the doctor there said that there was no reason for her to be crying out like that and therefore discontinue the Coban there is using the alginate along with con form. Electronic Signature(s) Signed: 09/23/2019 10:44:10 AM By: Worthy Keeler PA-C Entered By: Worthy Keeler on 09/23/2019 10:44:09 -------------------------------------------------------------------------------- Physician Orders Details Patient Name: Date of Service: [name]. 09/23/2019 9:30 AM Medical Record [id number] Patient Account Number: [id number] Date of Birth/Sex: Treating RN:  08/12/1951 (69 y.o. Tommye Standard Primary Care Provider: Luis Abed Other Clinician: Referring Provider: Treating Provider/Extender:Stone III, Lanier Ensign, BAILEY Weeks in Treatment: 6 Verbal / Phone Orders: No Diagnosis Coding ICD-10 Coding Code  Description I89.0 Lymphedema, not elsewhere classified L97.822 Non-pressure chronic ulcer of other part of left lower leg with fat layer exposed L97.812 Non-pressure chronic ulcer of other part of right lower leg with fat layer exposed L89.620 Pressure ulcer of left heel, unstageable L89.610 Pressure ulcer of right heel, unstageable Follow-up Appointments Return appointment in 3 weeks. Dressing Change Frequency Change dressing three times week. - and/or as needed for drainage Skin Barriers/Peri-Wound Care Barrier cream - to periwounds as needed Moisturizing lotion - to dry skin Wound Cleansing Clean wound with Normal Saline. Primary Wound Dressing Wound #3 Right Calcaneus Calcium Alginate with Silver Wound #4 Left,Lateral Calcaneus Calcium Alginate with Silver Wound #5 Right,Circumferential Lower Leg Calcium Alginate with Silver Wound #6 Left,Circumferential Lower Leg Calcium Alginate with Silver Secondary Dressing Wound #3 Right Calcaneus Kerlix/Rolled Gauze Dry Gauze Heel Cup Wound #4 Left,Lateral Calcaneus Kerlix/Rolled Gauze Dry Gauze Heel Cup Wound #5 Right,Circumferential Lower Leg ABD pad Wound #6 Left,Circumferential Lower Leg Kerlix/Rolled Gauze ABD pad Edema Control Avoid standing for long periods of time Elevate legs to the level of the heart or above for 30 minutes daily and/or when sitting, a frequency of: - thoughout the day Exercise regularly Off-Loading Turn and reposition every 2 hours Other: - float heels off bed with pillows under calves Electronic Signature(s) Signed: 09/23/2019 6:10:09 PM By: Lenda Kelp PA-C Signed: 09/23/2019 6:17:53 PM By: Zenaida Deed RN, BSN Entered By: Zenaida Deed on 09/23/2019 10:37:14 -------------------------------------------------------------------------------- Problem List Details Patient Name: Date of Service: Caroline Hesselbach. 09/23/2019 9:30 AM Medical Record [id number] Patient Account Number:  [id number] Date of Birth/Sex: Treating RN: 18-Dec-1950 (69 y.o. F) Primary Care Provider: Luis Abed Other Clinician: Referring Provider: Treating Provider/Extender:Stone III, Lanier Ensign, BAILEY Weeks in Treatment: 6 Active Problems ICD-10 Evaluated Encounter Code Description Active Date Today Diagnosis I89.0 Lymphedema, not elsewhere classified 08/12/2019 No Yes L97.822 Non-pressure chronic ulcer of other part of left lower 08/12/2019 No Yes leg with fat layer exposed L97.812 Non-pressure chronic ulcer of other part of right lower 08/12/2019 No Yes leg with fat layer exposed L89.620 Pressure ulcer of left heel, unstageable 08/12/2019 No Yes L89.610 Pressure ulcer of right heel, unstageable 08/12/2019 No Yes Inactive Problems Resolved Problems Electronic Signature(s) Signed: 09/23/2019 9:52:48 AM By: Lenda Kelp PA-C Entered By: Lenda Kelp on 09/23/2019 09:52:48 -------------------------------------------------------------------------------- Progress Note Details Patient Name: Date of Service: Caroline Hesselbach. 09/23/2019 9:30 AM Medical Record [id number] Patient Account Number: [id number] Date of Birth/Sex: Treating RN: 01-05-1951 (69 y.o. F) Primary Care Provider: Luis Abed Other Clinician: Referring Provider: Treating Provider/Extender:Stone III, Lanier Ensign, BAILEY Weeks in Treatment: 6 Subjective Chief Complaint Information obtained from Patient Bilateral LE and bilateral heel pressure ulcers History of Present Illness (HPI) ADMISSION 03/03/18 This is a 69 year old woman who is here for review of wounds on her bilateral lower legs which she states of been there for 8 months. She is not able to give a really great history about how they started simply thinks that they showed up 1 day. She apparently has a history of weeping bilateral lower extremity edema probably chronic venous insufficiency plus or minus lymphedema. She was seen  on 02/05/18 at her primary physician in [facility] noted to have a large wound on the right leg a smaller wound on the  left. She was given Unna boots and 10 days of Keflex. Previous to this she had been in the ER on several occasions in February for review of these wounds. The patient is not a diabetic however she does smoke. Past medical history includes atrial fibrillation, anemia, bipolar disorder, We were not able to get an ABI in our clinic today as the patient could not stand it. She comes into the clinic without anything on the wounds. She has advanced home care at home although I'm really not certain what they've been putting on this Readmission: 08/12/2019 on evaluation today patient actually presents for follow-up here in our clinic we have not seen her since actually last year which was July 8 of 2019. With that being said she is still having issues with bilateral lower extremity wounds. The right is definitely worse than the left but both are significant. She has pressure ulcers to both heels along with the open lymphedema/venous ulcers to her legs. Unfortunately she is having significant pain she also appears to have severe cellulitis in my opinion especially on the right which is extending all the way up to her knee and seems to be tracking such from the wounds down below. There is no signs of obvious systemic infection such as sepsis but nonetheless she has had this it seems like for quite some time. There definitely is some signs of dementia or at least not fully understanding exactly what is going on though I think the patient is able to tell me the truth about her pain I think she is in tremendous pain. She initially told us that she was not going to allow anybody to touch her. However we discussed with her that we did need to have a look at the wounds and we would have to do a few things in order to try to help her she was cooperative and actually did very well. She  just had to be somewhat coached and what was going on and not just have something sprung on her. With that being said she is also very much able to lift up her legs to let me look at all the areas she did that on her own and was very helpful in that regard. 12/30-Patient is back after 2 weeks from the facility where she is getting care for her wounds but she says she does her own wound cleaning, she has pressure ulcers and venous ulcers to her legs that unfortunately have been nonhealing and progressive, she was hospitalized for 2 days with cellulitis on the right which is the worst side and sent out on Augmentin which she is completing. The wound on the right leg look pretty bad there is no significant inflammation noted in the surrounding skin 09/09/2019 on evaluation today patient unfortunately continues to have significant pain in regard to her lower extremities. She has been tolerating dressing changes without complication. Fortunately there is no signs of active infection at this time. With that being said she has really somewhat poor blood flow in regard to her lower extremities. She has significant edema especially on the right but even on the left she is experiencing this as well. Unfortunately overall I am very concerned about the fact that this may not be something that actually can heal. 09/23/2019 on evaluation today patient unfortunately appears to be doing about the same. There is no signs of active systemic infection although she has significant wounds. I did question whether or not we should do a culture today  of the wound area on the right lower extremity. The patient however does not want to do any cultures at this point. Last week we actually did recommend a light Coban over top of the con form to be performed at the facility as well that was actually 2 weeks ago when I saw her. Nonetheless they did not do that the doctor the facility change the orders because apparently she was  crying out too much when they were trying to put on the Coban. Overall she does not seem to be doing too well in my opinion. Things again are about the same but at the same time she still is talking about the possibility of amputation. Objective Constitutional Well-nourished and well-hydrated in no acute distress. Vitals Time Taken: 9:40 AM, Height: 66 in, Weight: 178 lbs, BMI: 28.7, Temperature: 98.9 F, Pulse: 75 bpm, Respiratory Rate: 20 breaths/min, Blood Pressure: 114/46 mmHg. Respiratory normal breathing without difficulty. Psychiatric this patient is able to make decisions and demonstrates good insight into disease process. Alert and Oriented x 3. pleasant and cooperative. General Notes: Patient's wounds unfortunately show significant slough buildup on the surface of the wounds unfortunately. With that being said she does not want me to perform any culture at this time. She also does not really want the Coban apparently she was crying out at the facility when they were trying to put this on and the doctor there said that there was no reason for her to be crying out like that and therefore discontinue the Coban there is using the alginate along with con form. Integumentary (Hair, Skin) Wound #3 status is Open. Original cause of wound was Not Known. The wound is located on the Right Calcaneus. The wound measures 1.4cm length x 3cm width x 0.1cm depth; 3.299cm^2 area and 0.33cm^3 volume. There is no tunneling or undermining noted. There is a medium amount of purulent drainage noted. The wound margin is distinct with the outline attached to the wound base. There is small (1-33%) pink granulation within the wound bed. There is a large (67-100%) amount of necrotic tissue within the wound bed including Adherent Slough. Wound #4 status is Open. Original cause of wound was Not Known. The wound is located on the Left,Lateral Calcaneus. The wound measures 2.5cm length x 2cm width x 0.1cm depth;  3.927cm^2 area and 0.393cm^3 volume. There is no tunneling or undermining noted. There is a medium amount of purulent drainage noted. The wound margin is distinct with the outline attached to the wound base. There is small (1-33%) red, pink granulation within the wound bed. There is a large (67-100%) amount of necrotic tissue within the wound bed including Eschar and Adherent Slough. Wound #5 status is Open. Original cause of wound was Blister. The wound is located on the Right,Circumferential Lower Leg. The wound measures 20cm length x 25cm width x 0.2cm depth; 392.699cm^2 area and 78.54cm^3 volume. There is Fat Layer (Subcutaneous Tissue) Exposed exposed. There is no tunneling or undermining noted. There is a large amount of purulent drainage noted. The wound margin is distinct with the outline attached to the wound base. There is small (1-33%) pink granulation within the wound bed. There is a large (67-100%) amount of necrotic tissue within the wound bed including Adherent Slough. Wound #6 status is Open. Original cause of wound was Blister. The wound is located on the Left,Circumferential Lower Leg. The wound measures 18cm length x 16cm width x 0.2cm depth; 226.195cm^2 area and 45.239cm^3 volume. There is Fat Layer (Subcutaneous  Tissue) Exposed exposed. There is no tunneling or undermining noted. There is a large amount of purulent drainage noted. The wound margin is distinct with the outline attached to the wound base. There is small (1-33%) pink granulation within the wound bed. There is a large (67-100%) amount of necrotic tissue within the wound bed including Adherent Slough. Assessment Active Problems ICD-10 Lymphedema, not elsewhere classified Non-pressure chronic ulcer of other part of left lower leg with fat layer exposed Non-pressure chronic ulcer of other part of right lower leg with fat layer exposed Pressure ulcer of left heel, unstageable Pressure ulcer of right heel,  unstageable Plan Follow-up Appointments: Return appointment in 3 weeks. Dressing Change Frequency: Change dressing three times week. - and/or as needed for drainage Skin Barriers/Peri-Wound Care: Barrier cream - to periwounds as needed Moisturizing lotion - to dry skin Wound Cleansing: Clean wound with Normal Saline. Primary Wound Dressing: Wound #3 Right Calcaneus: Calcium Alginate with Silver Wound #4 Left,Lateral Calcaneus: Calcium Alginate with Silver Wound #5 Right,Circumferential Lower Leg: Calcium Alginate with Silver Wound #6 Left,Circumferential Lower Leg: Calcium Alginate with Silver Secondary Dressing: Wound #3 Right Calcaneus: Kerlix/Rolled Gauze Dry Gauze Heel Cup Wound #4 Left,Lateral Calcaneus: Kerlix/Rolled Gauze Dry Gauze Heel Cup Wound #5 Right,Circumferential Lower Leg: ABD pad Wound #6 Left,Circumferential Lower Leg: Kerlix/Rolled Gauze ABD pad Edema Control: Avoid standing for long periods of time Elevate legs to the level of the heart or above for 30 minutes daily and/or when sitting, a frequency of: - thoughout the day Exercise regularly Off-Loading: Turn and reposition every 2 hours Other: - float heels off bed with pillows under calves 1. My suggestion at this time is good to be that we go ahead and just switch the dressing changes to the same thing that the facility is doing at this point which is Curlex with silver alginate. Barrier cream is also being utilized. 2. I am also going to recommend she continue to elevate her legs is much as possible obviously she needs to keep her heels off the floor as well. The more her heels on the floor the more she is going to have trouble out of these. 3. I am also going to suggest that she talk to the doctor there at the facility about a referral to speak with a surgeon she keeps asking me about the possibility of amputation but again I am not necessarily the person to talk to that would be more of a  surgical issue. We will see patient back for reevaluation in 1 week here in the clinic. If anything worsens or changes patient will contact our office for additional recommendations. Electronic Signature(s) Signed: 09/23/2019 10:45:11 AM By: Lenda Kelp PA-C Entered By: Lenda Kelp on 09/23/2019 10:45:11 -------------------------------------------------------------------------------- SuperBill Details Patient Name: Date of Service: Caroline Hesselbach 09/23/2019 Medical Record [id number] Patient Account Number: [id number] Date of Birth/Sex: Treating RN: 12/22/1950 (69 y.o. Tommye Standard Primary Care Provider: Luis Abed Other Clinician: Referring Provider: Treating Provider/Extender:Stone III, Lanier Ensign, BAILEY Weeks in Treatment: 6 Diagnosis Coding ICD-10 Codes Code Description I89.0 Lymphedema, not elsewhere classified L97.822 Non-pressure chronic ulcer of other part of left lower leg with fat layer exposed L97.812 Non-pressure chronic ulcer of other part of right lower leg with fat layer exposed L89.620 Pressure ulcer of left heel, unstageable L89.610 Pressure ulcer of right heel, unstageable Facility Procedures CPT4 Code: [id number] Description: 79480 - WOUND CARE VISIT-LEV 5 EST PT Modifier: Quantity: 1 Physician Procedures CPT4 Code Description: [id number] 99213 - WC  PHYS LEVEL 3 - EST PT ICD-10 Diagnosis Description I89.0 Lymphedema, not elsewhere classified L97.822 Non-pressure chronic ulcer of other part of left lower l L97.812 Non-pressure chronic ulcer of other part of  right lower L89.620 Pressure ulcer of left heel, unstageable Modifier: eg with fat lay leg with fat la Quantity: 1 er exposed yer exposed Electronic Signature(s) Signed: 09/23/2019 10:45:27 AM By: Lenda Kelp PA-C Entered By: Lenda Kelp on 09/23/2019 10:45:26

## 2019-09-23 NOTE — Progress Notes (Signed)
[name], [name] ([id number]) Visit Report for 09/23/2019 Arrival Information Details Patient Name: Date of Service: [name], [name] 09/23/2019 9:30 AM Medical Record [id number] Patient Account Number: [id number] Date of Birth/Sex: Treating RN: 08-Aug-1951 (69 y.o. [name] Primary Care Jacquiline Zurcher: [place] Constable Other Clinician: Referring Welcome Fults: Treating Raynell Upton/Extender:Stone III, Nancie Neas, BAILEY Weeks in Treatment: 6 Visit Information History Since Last Visit Added or deleted any medications: No Patient Arrived: Wheel Chair Any new allergies or adverse reactions: No Arrival Time: 09:41 Had a fall or experienced change in No activities of daily living that may affect Accompanied By: cargiver risk of falls: Transfer Assistance: Manual Signs or symptoms of abuse/neglect since last No Patient Identification Verified: Yes visito Secondary Verification Process Completed: Yes Hospitalized since last visit: No Patient Requires Transmission-Based No Implantable device outside of the clinic excluding No Precautions: cellular tissue based products placed in the center Patient Has Alerts: No since last visit: Has Dressing in Place as Prescribed: Yes Pain Present Now: Yes Electronic Signature(s) Signed: 09/23/2019 5:26:35 PM By: Deon Pilling Entered By: Deon Pilling on 09/23/2019 09:42:22 -------------------------------------------------------------------------------- Clinic Level of Care Assessment Details Patient Name: Date of Service: [name], [name] 09/23/2019 9:30 AM Medical Record [id number] Patient Account Number: [id number] Date of Birth/Sex: Treating RN: 06-29-1951 (69 y.o. [name] Primary Care Austen Wygant: [place] Constable Other Clinician: Referring Manha Amato: Treating Charnell Peplinski/Extender:Stone III, Nancie Neas, BAILEY Weeks in Treatment: 6 Clinic Level of Care Assessment Items TOOL 4 Quantity Score '[]'  - Use when only an  EandM is performed on FOLLOW-UP visit 0 ASSESSMENTS - Nursing Assessment / Reassessment X - Reassessment of Co-morbidities (includes updates in patient status) 1 10 X - Reassessment of Adherence to Treatment Plan 1 5 ASSESSMENTS - Wound and Skin Assessment / Reassessment '[]'  - Simple Wound Assessment / Reassessment - one wound 0 X - Complex Wound Assessment / Reassessment - multiple wounds 4 5 '[]'  - Dermatologic / Skin Assessment (not related to wound area) 0 ASSESSMENTS - Focused Assessment X - Circumferential Edema Measurements - multi extremities 2 5 '[]'  - Nutritional Assessment / Counseling / Intervention 0 X - Lower Extremity Assessment (monofilament, tuning fork, pulses) 1 5 '[]'  - Peripheral Arterial Disease Assessment (using hand held doppler) 0 ASSESSMENTS - Ostomy and/or Continence Assessment and Care '[]'  - Incontinence Assessment and Management 0 '[]'  - Ostomy Care Assessment and Management (repouching, etc.) 0 PROCESS - Coordination of Care X - Simple Patient / Family Education for ongoing care 1 15 '[]'  - Complex (extensive) Patient / Family Education for ongoing care 0 X - Staff obtains Programmer, systems, Records, Test Results / Process Orders 1 10 X - Staff telephones HHA, Nursing Homes / Clarify orders / etc 1 10 '[]'  - Routine Transfer to another Facility (non-emergent condition) 0 '[]'  - Routine Hospital Admission (non-emergent condition) 0 '[]'  - New Admissions / Biomedical engineer / Ordering NPWT, Apligraf, etc. 0 '[]'  - Emergency Hospital Admission (emergent condition) 0 X - Simple Discharge Coordination 1 10 '[]'  - Complex (extensive) Discharge Coordination 0 PROCESS - Special Needs '[]'  - Pediatric / Minor Patient Management 0 '[]'  - Isolation Patient Management 0 '[]'  - Hearing / Language / Visual special needs 0 '[]'  - Assessment of Community assistance (transportation, D/C planning, etc.) 0 '[]'  - Additional assistance / Altered mentation 0 '[]'  - Support Surface(s) Assessment (bed, cushion,  seat, etc.) 0 INTERVENTIONS - Wound Cleansing / Measurement '[]'  - Simple Wound Cleansing - one wound 0 X - Complex Wound Cleansing - multiple wounds 4 5 X -  Wound Imaging (photographs - any number of wounds) 1 5 '[]'  - Wound Tracing (instead of photographs) 0 '[]'  - Simple Wound Measurement - one wound 0 X - Complex Wound Measurement - multiple wounds 4 5 INTERVENTIONS - Wound Dressings X - Small Wound Dressing one or multiple wounds 2 10 X - Medium Wound Dressing one or multiple wounds 2 15 '[]'  - Large Wound Dressing one or multiple wounds 0 X - Application of Medications - topical 1 5 '[]'  - Application of Medications - injection 0 INTERVENTIONS - Miscellaneous '[]'  - External ear exam 0 '[]'  - Specimen Collection (cultures, biopsies, blood, body fluids, etc.) 0 '[]'  - Specimen(s) / Culture(s) sent or taken to Lab for analysis 0 '[]'  - Patient Transfer (multiple staff / Civil Service fast streamer / Similar devices) 0 '[]'  - Simple Staple / Suture removal (25 or less) 0 '[]'  - Complex Staple / Suture removal (26 or more) 0 '[]'  - Hypo / Hyperglycemic Management (close monitor of Blood Glucose) 0 '[]'  - Ankle / Brachial Index (ABI) - do not check if billed separately 0 X - Vital Signs 1 5 Has the patient been seen at the hospital within the last three years: Yes Total Score: 200 Level Of Care: New/Established - Level 5 Electronic Signature(s) Signed: 09/23/2019 6:17:53 PM By: Baruch Gouty RN, BSN Entered By: Baruch Gouty on 09/23/2019 10:38:50 -------------------------------------------------------------------------------- Encounter Discharge Information Details Patient Name: Date of Service: [facility]. 09/23/2019 9:30 AM Medical Record [id number] Patient Account Number: [id number] Date of Birth/Sex: Treating RN: 1950-09-02 (69 y.o. [name] Primary Care Jazzlin Clements: [place] Constable Other Clinician: Referring Leeona Mccardle: Treating Cattleya Dobratz/Extender:Stone III, Nancie Neas, BAILEY Weeks in  Treatment: 6 Encounter Discharge Information Items Discharge Condition: Stable Ambulatory Status: Wheelchair Discharge Destination: Home Transportation: Private Auto Accompanied By: caregiver Schedule Follow-up Appointment: Yes Clinical Summary of Care: Patient Declined Electronic Signature(s) Signed: 09/23/2019 5:50:55 PM By: Carlene Coria RN Entered By: Carlene Coria on 09/23/2019 11:04:13 -------------------------------------------------------------------------------- Lower Extremity Assessment Details Patient Name: Date of Service: [name], [name] 09/23/2019 9:30 AM Medical Record [id number] Patient Account Number: [id number] Date of Birth/Sex: Treating RN: June 17, 1951 (69 y.o. [name] Primary Care Lillion Elbert: [place] Constable Other Clinician: Referring Veryl Winemiller: Treating Adeja Sarratt/Extender:Stone III, Nancie Neas, BAILEY Weeks in Treatment: 6 Edema Assessment Assessed: [Left: Yes] [Right: Yes] Edema: [Left: Yes] [Right: Yes] Calf Left: Right: Point of Measurement: cm From Medial Instep 38.6 cm 42 cm Ankle Left: Right: Point of Measurement: cm From Medial Instep 25.5 cm 24 cm Electronic Signature(s) Signed: 09/23/2019 5:26:35 PM By: Deon Pilling Entered By: Deon Pilling on 09/23/2019 09:47:54 -------------------------------------------------------------------------------- Multi-Disciplinary Care Plan Details Patient Name: Date of Service: [facility]. 09/23/2019 9:30 AM Medical Record [id number] Patient Account Number: [id number] Date of Birth/Sex: Treating RN: 1950-09-18 (69 y.o. [name] Primary Care Akya Fiorello: [place] Constable Other Clinician: Referring Lilymarie Scroggins: Treating Boyde Grieco/Extender:Stone III, Nancie Neas, BAILEY Weeks in Treatment: 6 Active Inactive Venous Leg Ulcer Nursing Diagnoses: Knowledge deficit related to disease process and management Potential for venous Insuffiency (use before diagnosis  confirmed) Goals: Patient will maintain optimal edema control Date Initiated: 08/26/2019 Target Resolution Date: 10/21/2019 Goal Status: Active Patient/caregiver will verbalize understanding of disease process and disease management Date Initiated: 08/26/2019 Date Inactivated: 09/23/2019 Target Resolution Date: 09/23/2019 Goal Status: Met Interventions: Assess peripheral edema status every visit. Compression as ordered Treatment Activities: Therapeutic compression applied : 08/26/2019 Notes: Wound/Skin Impairment Nursing Diagnoses: Impaired tissue integrity Knowledge deficit related to ulceration/compromised skin integrity Goals: Patient/caregiver will verbalize understanding of skin care regimen  Date Initiated: 08/26/2019 Target Resolution Date: 10/21/2019 Goal Status: Active Ulcer/skin breakdown will have a volume reduction of 30% by week 4 Date Initiated: 08/26/2019 Date Inactivated: 09/23/2019 Target Resolution Date: 09/23/2019 Unmet Reason: pt refuses Goal Status: Unmet debridements Interventions: Assess patient/caregiver ability to obtain necessary supplies Assess patient/caregiver ability to perform ulcer/skin care regimen upon admission and as needed Assess ulceration(s) every visit Treatment Activities: Skin care regimen initiated : 08/26/2019 Topical wound management initiated : 08/26/2019 Notes: Electronic Signature(s) Signed: 09/23/2019 6:17:53 PM By: Baruch Gouty RN, BSN Entered By: Baruch Gouty on 09/23/2019 10:27:40 -------------------------------------------------------------------------------- Pain Assessment Details Patient Name: Date of Service: [facility]. 09/23/2019 9:30 AM Medical Record [id number] Patient Account Number: [id number] Date of Birth/Sex: Treating RN: [birth year] (69 y.o. [name] Primary Care Kamaree Wheatley: [place] Constable Other Clinician: Referring Leslieanne Cobarrubias: Treating Undrea Shipes/Extender:Stone III, Nancie Neas,  BAILEY Weeks in Treatment: 6 Active Problems Location of Pain Severity and Description of Pain Patient Has Paino Yes Site Locations Pain Location: Generalized Pain, Pain in Ulcers Rate the pain. Current Pain Level: 5 Worst Pain Level: 10 Least Pain Level: 0 Tolerable Pain Level: 8 Character of Pain Describe the Pain: Aching, Cramping, Heavy, Sharp Pain Management and Medication Current Pain Management: Medication: Yes Cold Application: No Rest: Yes Massage: No Activity: No T.E.N.S.: No Heat Application: No Leg drop or elevation: Yes Is the Current Pain Management Adequate: Adequate How does your wound impact your activities of daily livingo Sleep: Yes Bathing: No Appetite: No Relationship With Others: No Bladder Continence: No Emotions: No Bowel Continence: No Work: No Toileting: No Drive: No Dressing: No Hobbies: Yes Electronic Signature(s) Signed: 09/23/2019 5:26:35 PM By: Deon Pilling Entered By: Deon Pilling on 09/23/2019 09:41:55 -------------------------------------------------------------------------------- Patient/Caregiver Education Details Patient Name: Date of Service: [facility] 1/27/2021andnbsp9:30 AM Medical Record [id number] Patient Account Number: [id number] Date of Birth/Gender: Treating RN: 10-Mar-1951 (69 y.o. [name] Primary Care Physician: [place] Constable Other Clinician: Referring Physician: Treating Physician/Extender:Stone III, Nancie Neas, Ardelle Balls in Treatment: 6 Education Assessment Education Provided To: Patient Education Topics Provided Venous: Methods: Explain/Verbal Responses: Reinforcements needed, State content correctly Wound/Skin Impairment: Methods: Explain/Verbal Responses: Reinforcements needed, State content correctly Electronic Signature(s) Signed: 09/23/2019 6:17:53 PM By: Baruch Gouty RN, BSN Entered By: Baruch Gouty on 09/23/2019  10:28:02 -------------------------------------------------------------------------------- Wound Assessment Details Patient Name: Date of Service: [facility]. 09/23/2019 9:30 AM Medical Record [id number] Patient Account Number: [id number] Date of Birth/Sex: Treating RN: 29-Dec-1950 (69 y.o. F) Primary Care Onyx Schirmer: Other Clinician: [place] Constable Referring Rebekka Lobello: Treating Aleysha Meckler/Extender:Stone III, Nancie Neas, BAILEY Weeks in Treatment: 6 Wound Status Wound Number: 3 Primary Etiology: Pressure Ulcer Wound Location: Right Calcaneus Wound Status: Open Wounding Event: Not Known Comorbid History: Anemia, Arrhythmia Date Acquired: 06/28/2019 Weeks Of Treatment: 6 Clustered Wound: No Photos Wound Measurements Length: (cm) 1.4 % Reduction i Width: (cm) 3 % Reduction i Depth: (cm) 0.1 Epithelializa Area: (cm) 3.299 Tunneling: Volume: (cm) 0.33 Undermining: Wound Description Classification: Unstageable/Unclassified Foul Odor Af Wound Margin: Distinct, outline attached Slough/Fibri Exudate Amount: Medium Exudate Type: Purulent Exudate Color: yellow, brown, green Wound Bed Granulation Amount: Small (1-33%) Granulation Quality: Pink Fascia Expose Necrotic Amount: Large (67-100%) Fat Layer (Su Necrotic Quality: Adherent Slough Tendon Expose Muscle Expose Joint Exposed Bone Exposed: ter Cleansing: No no Yes Exposed Structure d: No bcutaneous Tissue) Exposed: No d: No d: No : No No n Area: 1.2% n Volume: 1.2% tion: Small (1-33%) No No Treatment Notes Wound #3 (Right Calcaneus) 1. Cleanse With Wound Cleanser Soap and water  3. Primary Dressing Applied Calcium Alginate Ag 4. Secondary Dressing ABD Pad Dry Gauze Roll Gauze 5. Secured With Tape Notes Horticulturist, commercial) Signed: 09/23/2019 4:33:34 PM By: Mikeal Hawthorne EMT/HBOT Entered By: Mikeal Hawthorne on 09/23/2019  14:40:31 -------------------------------------------------------------------------------- Wound Assessment Details Patient Name: Date of Service: [facility]. 09/23/2019 9:30 AM Medical Record [id number] Patient Account Number: [id number] Date of Birth/Sex: Treating RN: 09-18-1950 (69 y.o. F) Primary Care Mandie Crabbe: [place] Constable Other Clinician: Referring Kariann Wecker: Treating Tamar Lipscomb/Extender:Stone III, Nancie Neas, BAILEY Weeks in Treatment: 6 Wound Status Wound Number: 4 Primary Etiology: Pressure Ulcer Wound Location: Left Calcaneus - Lateral Wound Status: Open Wounding Event: Not Known Comorbid History: Anemia, Arrhythmia Date Acquired: 06/28/2019 Weeks Of Treatment: 6 Clustered Wound: No Photos Wound Measurements Length: (cm) 2.5 Width: (cm) 2 Depth: (cm) 0.1 Area: (cm) 3.927 Volume: (cm) 0.393 Wound Description Classification: Category/Stage III Wound Margin: Distinct, outline attached Exudate Amount: Medium Exudate Type: Purulent Exudate Color: yellow, brown, green Wound Bed Granulation Amount: Small (1-33%) Granulation Quality: Red, Pink Necrotic Amount: Large (67-100%) Necrotic Quality: Eschar, Adherent Slough After Cleansing: No rino No Exposed Structure posed: No (Subcutaneous Tissue) Exposed: No posed: No posed: No osed: No sed: No % Reduction in Area: 58.3% % Reduction in Volume: 58.3% Epithelialization: Small (1-33%) Tunneling: No Undermining: No Foul Odor Slough/Fib Fascia Ex Fat Layer Tendon Ex Muscle Ex Joint Exp Bone Expo Treatment Notes Wound #4 (Left, Lateral Calcaneus) 1. Cleanse With Wound Cleanser Soap and water 3. Primary Dressing Applied Calcium Alginate Ag 4. Secondary Dressing ABD Pad Dry Gauze Roll Gauze 5. Secured With Tape Notes Horticulturist, commercial) Signed: 09/23/2019 4:33:34 PM By: Mikeal Hawthorne EMT/HBOT Entered By: Mikeal Hawthorne on 09/23/2019  14:41:19 -------------------------------------------------------------------------------- Wound Assessment Details Patient Name: Date of Service: [facility]. 09/23/2019 9:30 AM Medical Record [id number] Patient Account Number: [id number] Date of Birth/Sex: Treating RN: [birth year] (69 y.o. F) Primary Care Sharalyn Lomba: [place] Constable Other Clinician: Referring Nyeema Want: Treating Dorsie Sethi/Extender:Stone III, Nancie Neas, BAILEY Weeks in Treatment: 6 Wound Status Wound Number: 5 Primary Etiology: Venous Leg Ulcer Wound Location: Right Lower Leg - Circumferential Wound Status: Open Wounding Event: Blister Comorbid History: Anemia, Arrhythmia Date Acquired: 03/28/2019 Weeks Of Treatment: 6 Clustered Wound: No Photos Wound Measurements Length: (cm) 20 % Reduct Width: (cm) 25 % Reduct Depth: (cm) 0.2 Epitheli Area: (cm) 392.699 Tunneli Volume: (cm) 78.54 Undermi Wound Description Classification: Full Thickness Without Exposed Support Foul Odo Structures Slough/F Wound Distinct, outline attached Margin: Exudate Large Amount: Exudate Purulent Type: Exudate yellow, brown, green Color: Wound Bed Granulation Amount: Small (1-33%) Granulation Quality: Pink Fascia E Necrotic Amount: Large (67-100%) Fat Laye Necrotic Quality: Adherent Slough Tendon E Muscle E Joint Ex Bone Exp r After Cleansing: No ibrino Yes Exposed Structure xposed: No r (Subcutaneous Tissue) Exposed: Yes xposed: No xposed: No posed: No osed: No ion in Area: 53.4% ion in Volume: 53.4% alization: Small (1-33%) ng: No ning: No Treatment Notes Wound #5 (Right, Circumferential Lower Leg) 1. Cleanse With Wound Cleanser Soap and water 3. Primary Dressing Applied Calcium Alginate Ag 4. Secondary Dressing ABD Pad Dry Gauze Roll Gauze 5. Secured With Tape Notes Horticulturist, commercial) Signed: 09/23/2019 4:33:34 PM By: Mikeal Hawthorne EMT/HBOT Entered By: Mikeal Hawthorne on  09/23/2019 14:32:57 -------------------------------------------------------------------------------- Wound Assessment Details Patient Name: Date of Service: [facility]. 09/23/2019 9:30 AM Medical Record [id number] Patient Account Number: [id number] Date of Birth/Sex: Treating RN: [birth year] (69 y.o. F) Primary Care Shaneta Cervenka: [place] Constable Other Clinician: Referring Huber Mathers: Treating Krystofer Hevener/Extender:Stone III, Nancie Neas,  BAILEY Weeks in Treatment: 6 Wound Status Wound Number: 6 Primary Etiology: Venous Leg Ulcer Wound Location: Left Lower Leg - Circumferential Wound Status: Open Wounding Event: Blister Comorbid History: Anemia, Arrhythmia Date Acquired: 03/28/2019 Weeks Of Treatment: 6 Clustered Wound: Yes Photos Wound Measurements Length: (cm) 18 % Reduct Width: (cm) 16 % Reduct Depth: (cm) 0.2 Epitheli Clustered Quantity: 6 Tunnelin Area: (cm) 226.195 Undermi Volume: (cm) 45.239 Wound Description Classification: Full Thickness Without Exposed Support Foul Odo Structures Slough/F Wound Distinct, outline attached Margin: Exudate Large Amount: Exudate Purulent Type: Exudate yellow, brown, green Color: Wound Bed Granulation Amount: Small (1-33%) Granulation Quality: Pink Fascia E Necrotic Amount: Large (67-100%) Fat Laye Necrotic Quality: Adherent Slough Tendon E Muscle Expo Joint Expos Bone Expose r After Cleansing: No ibrino Yes Exposed Structure xposed: No r (Subcutaneous Tissue) Exposed: Yes xposed: No sed: No ed: No d: No ion in Area: 62.1% ion in Volume: 62.1% alization: Small (1-33%) g: No ning: No Treatment Notes Wound #6 (Left, Circumferential Lower Leg) 1. Cleanse With Wound Cleanser Soap and water 3. Primary Dressing Applied Calcium Alginate Ag 4. Secondary Dressing ABD Pad Dry Gauze Roll Gauze 5. Secured With Tape Notes Horticulturist, commercial) Signed: 09/23/2019 4:33:34 PM By: Mikeal Hawthorne  EMT/HBOT Entered By: Mikeal Hawthorne on 09/23/2019 14:40:54 -------------------------------------------------------------------------------- Vitals Details Patient Name: Date of Service: [facility]. 09/23/2019 9:30 AM Medical Record [id number] Patient Account Number: [id number] Date of Birth/Sex: Treating RN: 08-25-1951 (69 y.o. [name], Meta.Reding Primary Care Kristoff Coonradt: [place] Constable Other Clinician: Referring Ha Shannahan: Treating Gentry Seeber/Extender:Stone III, Nancie Neas, BAILEY Weeks in Treatment: 6 Vital Signs Time Taken: 09:40 Temperature (F): 98.9 Height (in): 66 Pulse (bpm): 75 Weight (lbs): 178 Respiratory Rate (breaths/min): 20 Body Mass Index (BMI): 28.7 Blood Pressure (mmHg): 114/46 Reference Range: 80 - 120 mg / dl Electronic Signature(s) Signed: 09/23/2019 5:26:35 PM By: Deon Pilling Entered By: Deon Pilling on 09/23/2019 09:47:36

## 2019-10-14 ENCOUNTER — Encounter (HOSPITAL_BASED_OUTPATIENT_CLINIC_OR_DEPARTMENT_OTHER): Payer: Medicare Other | Attending: Physician Assistant | Admitting: Physician Assistant

## 2019-11-05 ENCOUNTER — Encounter (HOSPITAL_BASED_OUTPATIENT_CLINIC_OR_DEPARTMENT_OTHER): Payer: Medicare Other | Admitting: Internal Medicine

## 2019-11-05 ENCOUNTER — Encounter (HOSPITAL_BASED_OUTPATIENT_CLINIC_OR_DEPARTMENT_OTHER): Payer: Medicare Other | Attending: Internal Medicine | Admitting: Internal Medicine

## 2019-11-05 ENCOUNTER — Other Ambulatory Visit: Payer: Self-pay

## 2019-11-05 DIAGNOSIS — L97822 Non-pressure chronic ulcer of other part of left lower leg with fat layer exposed: Secondary | ICD-10-CM | POA: Insufficient documentation

## 2019-11-05 DIAGNOSIS — F319 Bipolar disorder, unspecified: Secondary | ICD-10-CM | POA: Insufficient documentation

## 2019-11-05 DIAGNOSIS — L89610 Pressure ulcer of right heel, unstageable: Secondary | ICD-10-CM | POA: Insufficient documentation

## 2019-11-05 DIAGNOSIS — I4891 Unspecified atrial fibrillation: Secondary | ICD-10-CM | POA: Diagnosis not present

## 2019-11-05 DIAGNOSIS — I890 Lymphedema, not elsewhere classified: Secondary | ICD-10-CM | POA: Diagnosis not present

## 2019-11-05 DIAGNOSIS — L97812 Non-pressure chronic ulcer of other part of right lower leg with fat layer exposed: Secondary | ICD-10-CM | POA: Diagnosis not present

## 2019-11-05 DIAGNOSIS — I10 Essential (primary) hypertension: Secondary | ICD-10-CM | POA: Diagnosis not present

## 2019-11-05 DIAGNOSIS — L89620 Pressure ulcer of left heel, unstageable: Secondary | ICD-10-CM | POA: Diagnosis not present

## 2019-11-05 DIAGNOSIS — L03115 Cellulitis of right lower limb: Secondary | ICD-10-CM | POA: Diagnosis not present

## 2019-11-07 NOTE — Progress Notes (Signed)
[name] ([id number]) Visit Report for 11/05/2019 Arrival Information Details Patient Name: Date of Service: [name], [name] 11/05/2019 9:15 AM Medical Record [id number] Patient Account Number: [id number] Date of Birth/Sex: Treating RN: 1951-08-14 (69 y.o. [name] Primary Care Marianne Golightly: [place] Constable Other Clinician: Referring Breeze Berringer: Treating Hunter Pinkard/Extender:Robson, Hope Budds, Ardelle Balls in Treatment: 12 Visit Information History Since Last Visit Added or deleted any medications: No Patient Arrived: Wheel Chair Any new allergies or adverse reactions: No Arrival Time: 09:22 Had a fall or experienced change in No activities of daily living that may affect Accompanied By: caregiver risk of falls: Transfer Assistance: None Signs or symptoms of abuse/neglect since last No Patient Identification Verified: Yes visito Secondary Verification Process Completed: Yes Hospitalized since last visit: No Patient Requires Transmission-Based No Implantable device outside of the clinic excluding No Precautions: cellular tissue based products placed in the Phillips Patient Has Alerts: No since last visit: Has Dressing in Place as Prescribed: Yes Pain Present Now: Yes Electronic Signature(s) Signed: 11/05/2019 6:03:12 PM By: Kela Millin Entered By: Kela Millin on 11/05/2019 09:31:07 -------------------------------------------------------------------------------- Compression Therapy Details Patient Name: Date of Service: [name]. 11/05/2019 9:15 AM Medical Record [id number] Patient Account Number: [id number] Date of Birth/Sex: Treating RN: 31-Dec-1950 (69 y.o. [name] Primary Care Troi Bechtold: [place] Constable Other Clinician: Referring Erice Ahles: Treating Kie Calvin/Extender:Robson, Hope Budds, Ardelle Balls in Treatment: 12 Compression Therapy Performed for Wound Wound #5 Right,Circumferential Lower  Leg Assessment: Performed By: Clinician Baruch Gouty, RN Compression Type: Rolena Infante Post Procedure Diagnosis Same as Pre-procedure Electronic Signature(s) Signed: 11/05/2019 6:00:33 PM By: Deon Pilling Entered By: Deon Pilling on 11/05/2019 10:14:39 -------------------------------------------------------------------------------- Compression Therapy Details Patient Name: Date of Service: [name] 11/05/2019 9:15 AM Medical Record [id number] Patient Account Number: [id number] Date of Birth/Sex: Treating RN: October 17, 1950 (69 y.o. [name] Primary Care Athenia Rys: [place] Constable Other Clinician: Referring Chesley Valls: Treating Treyshawn Muldrew/Extender:Robson, Hope Budds, Ardelle Balls in Treatment: 12 Compression Therapy Performed for Wound Wound #6 Left,Circumferential Lower Leg Assessment: Performed By: Clinician Baruch Gouty, RN Compression Type: Rolena Infante Post Procedure Diagnosis Same as Pre-procedure Electronic Signature(s) Signed: 11/05/2019 6:00:33 PM By: Deon Pilling Entered By: Deon Pilling on 11/05/2019 10:14:39 -------------------------------------------------------------------------------- Encounter Discharge Information Details Patient Name: Date of Service: [name]. 11/05/2019 9:15 AM Medical Record [id number] Patient Account Number: [id number] Date of Birth/Sex: Treating RN: Oct 05, 1950 (69 y.o. [name] Primary Care Hawley Michel: [place] Constable Other Clinician: Referring Ilijah Doucet: Treating Tylicia Sherman/Extender:Robson, Hope Budds, Ardelle Balls in Treatment: 12 Encounter Discharge Information Items Discharge Condition: Stable Ambulatory Status: Wheelchair Discharge Destination: Skilled Nursing Facility Telephoned: No Orders Sent: Yes Transportation: Other Accompanied By: facility staff Schedule Follow-up Appointment: Yes Clinical Summary of Care: Patient Declined Notes facility  transportation Electronic Signature(s) Signed: 11/05/2019 5:58:47 PM By: Baruch Gouty RN, BSN Entered By: Baruch Gouty on 11/05/2019 10:51:27 -------------------------------------------------------------------------------- Lower Extremity Assessment Details Patient Name: Date of Service: [name]. 11/05/2019 9:15 AM Medical Record [id number] Patient Account Number: [id number] Date of Birth/Sex: Treating RN: [birth year] (69 y.o. [name] Primary Care Dillon Mcreynolds: [place] Constable Other Clinician: Referring Hali Balgobin: Treating Erandy Mceachern/Extender:Robson, Hope Budds, BAILEY Weeks in Treatment: 12 Edema Assessment Assessed: [Left: No] [Right: No] Edema: [Left: Yes] [Right: Yes] Calf Left: Right: Point of Measurement: cm From Medial Instep 40 cm 42 cm Ankle Left: Right: Point of Measurement: cm From Medial Instep 25 cm 26 cm Vascular Assessment Pulses: Dorsalis Pedis Palpable: [Left:No] [Right:No] Electronic Signature(s) Signed: 11/05/2019 6:03:12 PM By: Kela Millin Entered By: Kela Millin on 11/05/2019  09:46:14 -------------------------------------------------------------------------------- Multi Wound Chart Details Patient Name: Date of Service: [name] 11/05/2019 9:15 AM Medical Record [id number] Patient Account Number: [id number] Date of Birth/Sex: Treating RN: 1951/02/14 (69 y.o. [name] Primary Care Kameran Mcneese: Other Clinician: [place] Constable Referring Ragina Fenter: Treating Sydna Brodowski/Extender:Robson, Hope Budds, BAILEY Weeks in Treatment: 12 Vital Signs Height(in): 66 Pulse(bpm): 69 Weight(lbs): 178 Blood Pressure(mmHg): 96/52 Body Mass Index(BMI): 29 Temperature(F): 98.2 Respiratory 19 Rate(breaths/min): Photos: [3:No Photos] [4:No Photos] [5:No Photos] Wound Location: [3:Right Calcaneus] [4:Left Calcaneus - Lateral] [5:Right, Circumferential Lower Leg] Wounding Event: [3:Not Known]  [4:Not Known] [5:Blister] Primary Etiology: [3:Pressure Ulcer] [4:Pressure Ulcer] [5:Venous Leg Ulcer] Comorbid History: [3:Anemia, Arrhythmia] [4:Anemia, Arrhythmia] [5:Anemia, Arrhythmia] Date Acquired: [3:06/28/2019] [4:06/28/2019] [5:03/28/2019] Weeks of Treatment: [3:12] [4:12] [5:12] Wound Status: [3:Open] [4:Open] [5:Open] Clustered Wound: [3:No] [4:No] [5:No] Clustered Quantity: [3:N/A] [4:N/A] [5:N/A] Measurements L x W x D [3:1x1.6x0.2] [4:1.6x1.2x0.3] [5:21.5x26.5x0.3] (cm) Area (cm) : [3:1.257] [4:1.508] [5:447.481] Volume (cm) : [3:0.251] [4:0.452] [5:134.244] % Reduction in Area: [3:62.30%] [4:84.00%] [5:46.90%] % Reduction in Volume: [3:24.90%] [4:52.00%] [5:20.30%] Classification: [3:Unstageable/Unclassified] [4:Category/Stage III] [5:Full Thickness Without Exposed Support Structures] Exudate Amount: [3:Medium] [4:Medium] [5:Large] Exudate Type: [3:Purulent] [4:Purulent] [5:Purulent] Exudate Color: [3:yellow, brown, green] [4:yellow, brown, green] [5:yellow, brown, green] Foul Odor After Cleansing:No [4:No] [5:Yes] Odor Anticipated Due to N/A [4:N/A] [5:No] Product Use: Wound Margin: [3:Distinct, outline attached] [4:Distinct, outline attached] [5:Distinct, outline attached] Granulation Amount: [3:Small (1-33%)] [4:Small (1-33%)] [5:Small (1-33%)] Granulation Quality: [3:Pink] [4:Red, Pink] [5:Pink] Necrotic Amount: [3:Large (67-100%)] [4:Large (67-100%)] [5:Large (67-100%)] Necrotic Tissue: [3:Adherent Slough] [4:Eschar, Adherent Slough] [5:Adherent Slough] Exposed Structures: [3:Fascia: No Fat Layer (Subcutaneous Tissue) Exposed: No Tendon: No Muscle: No Joint: No Bone: No] [4:Fascia: No Fat Layer (Subcutaneous Tissue) Exposed: No Tendon: No Muscle: No Joint: No Bone: No] [5:Fat Layer (Subcutaneous Tissue) Exposed: Yes  Fascia: No Tendon: No Muscle: No Joint: No Bone: No] Epithelialization: [3:Small (1-33%)] [4:Small (1-33%)] [5:Small (1-33%)] Procedures Performed: N/A  [4:N/A 6 N/A] [5:Compression Therapy N/A] Photos: [3:No Photos] [4:N/A] [5:N/A] Wound Location: [3:Left, Circumferential Lower N/A Leg] [5:N/A] Wounding Event: [3:Blister] [4:N/A] [5:N/A] Primary Etiology: [3:Venous Leg Ulcer] [4:N/A] [5:N/A] Comorbid History: [3:Anemia, Arrhythmia] [4:N/A] [5:N/A] Date Acquired: [3:03/28/2019] [4:N/A] [5:N/A] Weeks of Treatment: [3:12] [4:N/A] [5:N/A] Wound Status: [3:Open] [4:N/A] [5:N/A] Clustered Wound: [3:Yes] [4:N/A] [5:N/A] Clustered Quantity: [3:6] [4:N/A] [5:N/A] Measurements L x W x D [3:14.5x13.5x0.2] [4:N/A] [5:N/A] (cm) Area (cm) : [3:153.742] [4:N/A] [5:N/A] Volume (cm) : [3:30.748] [4:N/A] [5:N/A] % Reduction in Area: [3:74.20%] [4:N/A] [5:N/A] % Reduction in Volume: [3:74.20%] [4:N/A] [5:N/A] Classification: [3:Full Thickness Without Exposed Support Structures] [4:N/A] [5:N/A] Exudate Amount: [3:Large] [4:N/A] [5:N/A] Exudate Type: [3:Purulent] [4:N/A] [5:N/A] Exudate Color: [3:yellow, brown, green] [4:N/A] [5:N/A] Foul Odor After Cleansing:Yes [4:N/A] [5:N/A] Odor Anticipated Due to No [4:N/A] [5:N/A] Product Use: Wound Margin: [3:Distinct, outline attached N/A] [5:N/A] Granulation Amount: [3:Small (1-33%)] [4:N/A] [5:N/A] Granulation Quality: [3:Pink] [4:N/A] [5:N/A] Necrotic Amount: [3:Large (67-100%)] [4:N/A] [5:N/A] Necrotic Tissue: [3:Adherent Slough] [4:N/A] [5:N/A] Exposed Structures: [3:Fat Layer (Subcutaneous N/A Tissue) Exposed: Yes Fascia: No Tendon: No Muscle: No Joint: No Bone: No] [5:N/A] Epithelialization: [3:Small (1-33%)] [4:N/A N/A] [5:N/A N/A] Treatment Notes Electronic Signature(s) Signed: 11/05/2019 6:00:33 PM By: Deon Pilling Signed: 11/07/2019 7:03:15 AM By: Linton Ham MD Entered By: Linton Ham on 11/05/2019 10:16:03 -------------------------------------------------------------------------------- Multi-Disciplinary Care Plan Details Patient Name: Date of Service: [name]. 11/05/2019 9:15  AM Medical Record [id number] Patient Account Number: [id number] Date of Birth/Sex: Treating RN: 19-Mar-1951 (69 y.o. [name] Primary Care Kadince Boxley: [place] Constable Other Clinician: Referring Dellas Guard: Treating Wilkin Lippy/Extender:Robson, Hope Budds,  BAILEY Weeks in Treatment: 12 Active Inactive Venous Leg Ulcer Nursing Diagnoses: Knowledge deficit related to disease process and management Potential for venous Insuffiency (use before diagnosis confirmed) Goals: Patient will maintain optimal edema control Date Initiated: 08/26/2019 Target Resolution Date: 11/27/2019 Goal Status: Active Patient/caregiver will verbalize understanding of disease process and disease management Date Initiated: 08/26/2019 Date Inactivated: 09/23/2019 Target Resolution Date: 09/23/2019 Goal Status: Met Interventions: Assess peripheral edema status every visit. Compression as ordered Treatment Activities: Therapeutic compression applied : 08/26/2019 Notes: Wound/Skin Impairment Nursing Diagnoses: Impaired tissue integrity Knowledge deficit related to ulceration/compromised skin integrity Goals: Patient/caregiver will verbalize understanding of skin care regimen Date Initiated: 08/26/2019 Target Resolution Date: 11/27/2019 Goal Status: Active Ulcer/skin breakdown will have a volume reduction of 30% by week 4 Date Initiated: 08/26/2019 Date Inactivated: 09/23/2019 Target Resolution Date: 09/23/2019 Unmet Reason: pt refuses Goal Status: Unmet debridements Interventions: Assess patient/caregiver ability to obtain necessary supplies Assess patient/caregiver ability to perform ulcer/skin care regimen upon admission and as needed Assess ulceration(s) every visit Treatment Activities: Skin care regimen initiated : 08/26/2019 Topical wound management initiated : 08/26/2019 Notes: Electronic Signature(s) Signed: 11/05/2019 6:00:33 PM By: Deon Pilling Entered By: Deon Pilling on  11/05/2019 08:25:39 -------------------------------------------------------------------------------- Pain Assessment Details Patient Name: Date of Service: BHAVIKA, SCHNIDER 11/05/2019 9:15 AM Medical Record [id number] Patient Account Number: [id number] Date of Birth/Sex: Treating RN: 06/30/1951 (68 y.o. [name] Primary Care Reynald Woods: [place] Constable Other Clinician: Referring Bryten Maher: Treating Riki Berninger/Extender:Robson, Hope Budds, Ardelle Balls in Treatment: 12 Active Problems Location of Pain Severity and Description of Pain Patient Has Paino Yes Site Locations With Dressing Change: Yes Duration of the Pain. Constant / Intermittento Constant Rate the pain. Current Pain Level: 2 Worst Pain Level: 5 Least Pain Level: 2 Tolerable Pain Level: 2 Character of Pain Describe the Pain: Aching, Burning, Stabbing Pain Management and Medication Current Pain Management: Electronic Signature(s) Signed: 11/05/2019 6:00:33 PM By: Deon Pilling Signed: 11/05/2019 6:03:12 PM By: Kela Millin Entered By: Kela Millin on 11/05/2019 09:31:57 -------------------------------------------------------------------------------- Patient/Caregiver Education Details Patient Name: Date of Service: [name] 3/11/2021andnbsp9:15 AM Medical Record [id number] Patient Account Number: [id number] Date of Birth/Gender: Treating RN: 07-17-1951 (69 y.o. [name] Primary Care Physician: [place] Constable Other Clinician: Referring Physician: Treating Physician/Extender:Robson, Hope Budds, Ardelle Balls in Treatment: 12 Education Assessment Education Provided To: Patient Education Topics Provided Wound/Skin Impairment: Handouts: Skin Care Do's and Dont's Methods: Explain/Verbal Responses: Reinforcements needed Electronic Signature(s) Signed: 11/05/2019 6:00:33 PM By: Deon Pilling Entered By: Deon Pilling on 11/05/2019  08:25:51 -------------------------------------------------------------------------------- Wound Assessment Details Patient Name: Date of Service: [name], [name] 11/05/2019 9:15 AM Medical Record [id number] Patient Account Number: [id number] Date of Birth/Sex: Treating RN: 1951-07-27 (69 y.o. [name], Meta.Reding Primary Care Leelah Hanna: [place] Constable Other Clinician: Referring Tyquez Hollibaugh: Treating Edwen Mclester/Extender:Robson, Hope Budds, BAILEY Weeks in Treatment: 12 Wound Status Wound Number: 3 Primary Etiology: Pressure Ulcer Wound Location: Right Calcaneus Wound Status: Open Wounding Event: Not Known Comorbid History: Anemia, Arrhythmia Date Acquired: 06/28/2019 Weeks Of Treatment: 12 Clustered Wound: No Photos Wound Measurements Length: (cm) 1 % Reducti Width: (cm) 1.6 % Reducti Depth: (cm) 0.2 Epithelia Area: (cm) 1.257 Tunnelin Volume: (cm) 0.251 Undermin Wound Description Classification: Unstageable/Unclassified Foul Odo Wound Margin: Distinct, outline attached Slough/F Exudate Amount: Medium Exudate Type: Purulent Exudate Color: yellow, brown, green Wound Bed Granulation Amount: Small (1-33%) Granulation Quality: Pink Fascia Ex Necrotic Amount: Large (67-100%) Fat Layer Necrotic Quality: Adherent Slough Tendon Ex Muscle Ex Joint Exp Bone Expo r After Cleansing: No ibrino Yes  Exposed Structure posed: No (Subcutaneous Tissue) Exposed: No posed: No posed: No osed: No sed: No on in Area: 62.3% on in Volume: 24.9% lization: Small (1-33%) g: No ing: No Treatment Notes Wound #3 (Right Calcaneus) 2. Periwound Care Barrier cream 3. Primary Dressing Applied Calcium Alginate Ag 4. Secondary Dressing ABD Pad Heel Cup 6. Support Layer Best boy) Signed: 11/06/2019 4:47:30 PM By: Mikeal Hawthorne EMT/HBOT Signed: 11/06/2019 5:45:00 PM By: Deon Pilling Previous Signature: 11/05/2019 6:03:12 PM Version By: Kela Millin Entered By: Mikeal Hawthorne on 11/06/2019 11:19:38 -------------------------------------------------------------------------------- Wound Assessment Details Patient Name: Date of Service: [name]. 11/05/2019 9:15 AM Medical Record [id number] Patient Account Number: [id number] Date of Birth/Sex: Treating RN: Oct 04, 1950 (69 y.o. [name], Meta.Reding Primary Care Avery Eustice: [place] Con[place] Other Clinician: Referring Kenwood Rosiak: Treating Maisa Bedingfield/Extender:Robson, Hope Budds, BAILEY Weeks in Treatment: 12 Wound Status Wound Number: 4 Primary Etiology: Pressure Ulcer Wound Location: Left Calcaneus - Lateral Wound Status: Open Wounding Event: Not Known Comorbid History: Anemia, Arrhythmia Date Acquired: 06/28/2019 Weeks Of Treatment: 12 Clustered Wound: No Photos Wound Measurements Length: (cm) 1.6 % Reducti Width: (cm) 1.2 % Reducti Depth: (cm) 0.3 Epithelia Area: (cm) 1.508 Tunnelin Volume: (cm) 0.452 Undermin Wound Description Classification: Category/Stage III Wound Margin: Distinct, outline attached Exudate Amount: Medium Exudate Type: Purulent Exudate Color: yellow, brown, green Wound Bed Granulation Amount: Small (1-33%) Granulation Quality: Red, Pink Necrotic Amount: Large (67-100%) Necrotic Quality: Eschar, Adherent Slough Foul Odor After Cleansing: No Slough/Fibrino Yes Exposed Structure Fascia Exposed: No Fat Layer (Subcutaneous Tissue) Exposed: No Tendon Exposed: No Muscle Exposed: No Joint Exposed: No Bone Exposed: No on in Area: 84% on in Volume: 52% lization: Small (1-33%) g: No ing: No Treatment Notes Wound #4 (Left, Lateral Calcaneus) 2. Periwound Care Barrier cream 3. Primary Dressing Applied Calcium Alginate Ag 4. Secondary Dressing ABD Pad Heel Cup 6. Support Layer Best boy) Signed: 11/06/2019 4:47:30 PM By: Mikeal Hawthorne EMT/HBOT Signed: 11/06/2019 5:45:00 PM By: Deon Pilling Previous Signature: 11/05/2019 6:03:12 PM Version By: Kela Millin Entered By: Mikeal Hawthorne on 11/06/2019 11:20:11 -------------------------------------------------------------------------------- Wound Assessment Details Patient Name: Date of Service: [name]. 11/05/2019 9:15 AM Medical Record [id number] Patient Account Number: [id number] Date of Birth/Sex: Treating RN: 03-Jan-1951 (69 y.o. [name] Primary Care Madiline Saffran: [place] Con[place] Other Clinician: Referring Dayannara Pascal: Treating Simonne Boulos/Extender:Robson, Hope Budds, BAILEY Weeks in Treatment: 12 Wound Status Wound Number: 5 Primary Etiology: Venous Leg Ulcer Wound Location: Right Lower Leg - Circumferential Wound Status: Open Wounding Event: Blister Comorbid History: Anemia, Arrhythmia Date Acquired: 03/28/2019 Weeks Of Treatment: 12 Clustered Wound: No Photos Wound Measurements Length: (cm) 21.5 % Reduct Width: (cm) 26.5 % Reduct Depth: (cm) 0.3 Epitheli Area: (cm) 447.481 Tunneli Volume: (cm) 134.244 Undermi Wound Description Classification: Full Thickness Without Exposed Support Foul Odo Structures Due to P Wound Slough/F Distinct, outline attached Margin: Exudate Large Amount: Exudate Purulent Type: Exudate yellow, brown, green Color: Wound Bed Granulation Amount: Small (1-33%) Granulation Quality: Pink Fascia E Necrotic Amount: Large (67-100%) Fat Laye Necrotic Quality: Adherent Slough Tendon E Muscle E Joint Ex Bone Exp r After Cleansing: Yes roduct Use: No ibrino Yes Exposed Structure xposed: No r (Subcutaneous Tissue) Exposed: Yes xposed: No xposed: No posed: No osed: No ion in Area: 46.9% ion in Volume: 20.3% alization: Small (1-33%) ng: No ning: No Treatment Notes Wound #5 (Right, Circumferential Lower Leg) 2. Periwound Care Barrier cream 3. Primary Dressing Applied Calcium Alginate Ag 4. Secondary Dressing ABD Pad Heel Cup  6.  Support Layer Best boy) Signed: 11/06/2019 4:47:30 PM By: Mikeal Hawthorne EMT/HBOT Signed: 11/06/2019 5:31:36 PM By: Kela Millin Previous Signature: 11/05/2019 6:03:12 PM Version By: Kela Millin Entered By: Mikeal Hawthorne on 11/06/2019 11:18:19 -------------------------------------------------------------------------------- Wound Assessment Details Patient Name: Date of Service: [name]. 11/05/2019 9:15 AM Medical Record [id number] Patient Account Number: [id number] Date of Birth/Sex: Treating RN: 1951/04/28 (69 y.o. [name] Primary Care Junie Engram: [place] Con[place] Other Clinician: Referring Hiromi Knodel: Treating [name]/Extender:Robson, Hope Budds, BAILEY Weeks in Treatment: 12 Wound Status Wound Number: 6 Primary Etiology: Venous Leg Ulcer Wound Location: Left Lower Leg - Circumferential Wound Status: Open Wounding Event: Blister Comorbid History: Anemia, Arrhythmia Date Acquired: 03/28/2019 Weeks Of Treatment: 12 Clustered Wound: Yes Photos Wound Measurements Length: (cm) 14.5 % Reductio Width: (cm) 13.5 % Reductio Depth: (cm) 0.2 Epithelial Clustered Quantity: 6 Tunneling: Area: (cm) 153.742 Undermini Volume: (cm) 30.748 Wound Description Full Thickness Without Exposed Support Foul Od Classification: Structures Due to Wound Slough/ Distinct, outline attached Margin: Exudate Large Amount: Exudate Purulent Type: Exudate yellow, brown, green Color: Wound Bed Granulation Amount: Small (1-33%) Granulation Quality: Pink Fascia Necrotic Amount: Large (67-100%) Fat Lay Necrotic Quality: Adherent Slough Tendon Muscle Joint E Bone Ex or After Cleansing: Yes Product Use: No Fibrino Yes Exposed Structure Exposed: No er (Subcutaneous Tissue) Exposed: Yes Exposed: No Exposed: No xposed: No posed: No n in Area: 74.2% n in Volume: 74.2% ization: Small (1-33%) No ng: No Treatment  Notes Wound #6 (Left, Circumferential Lower Leg) 2. Periwound Care Barrier cream 3. Primary Dressing Applied Calcium Alginate Ag 4. Secondary Dressing ABD Pad Heel Cup 6. Support Layer Best boy) Signed: 11/06/2019 4:47:30 PM By: Mikeal Hawthorne EMT/HBOT Signed: 11/06/2019 5:31:36 PM By: Kela Millin Previous Signature: 11/05/2019 6:03:12 PM Version By: Kela Millin Entered By: Mikeal Hawthorne on 11/06/2019 11:18:51 -------------------------------------------------------------------------------- Vitals Details Patient Name: Date of Service: [name]. 11/05/2019 9:15 AM Medical Record [id number] Patient Account Number: [id number] Date of Birth/Sex: Treating RN: Dec 18, 1950 (69 y.o. [name] Primary Care Vangie Henthorn: [place] Con[place] Other Clinician: Referring Toron Bowring: Treating Deaunte Dente/Extender:Robson, Hope Budds, BAILEY Weeks in Treatment: 12 Vital Signs Time Taken: 09:30 Temperature (F): 98.2 Height (in): 66 Pulse (bpm): 69 Weight (lbs): 178 Respiratory Rate (breaths/min): 19 Body Mass Index (BMI): 28.7 Blood Pressure (mmHg): 96/52 Reference Range: 80 - 120 mg / dl Electronic Signature(s) Signed: 11/05/2019 6:03:12 PM By: Kela Millin Entered By: Kela Millin on 11/05/2019 09:31:37

## 2019-11-07 NOTE — Progress Notes (Signed)
[name], [name] ([id number]) Visit Report for 11/05/2019 HPI Details Patient Name: Date of Service: [name], [name] 11/05/2019 9:15 AM Medical Record [id number] Patient Account Number: [id number] Date of Birth/Sex: Treating RN: [birth year] (69 y.o. Arta Silence Primary Care Provider: Luis Abed Other Clinician: Referring Provider: Treating Provider/Extender:Baylen Dea, Kenard Gower, Renaldo Fiddler in Treatment: 12 History of Present Illness HPI Description: ADMISSION 03/03/18 This is a 69 year old woman who is here for review of wounds on her bilateral lower legs which she states of been there for 8 months. She is not able to give a really great history about how they started simply thinks that they showed up 1 day. She apparently has a history of weeping bilateral lower extremity edema probably chronic venous insufficiency plus or minus lymphedema. She was seen on 02/05/18 at her primary physician in [facility] family practice noted to have a large wound on the right leg a smaller wound on the left. She was given Unna boots and 10 days of Keflex. Previous to this she had been in the ER on several occasions in February for review of these wounds. The patient is not a diabetic however she does smoke. Past medical history includes atrial fibrillation, anemia, bipolar disorder, We were not able to get an ABI in our clinic today as the patient could not stand it. She comes into the clinic without anything on the wounds. She has advanced home care at home although I'm really not certain what they've been putting on this Readmission: 08/12/2019 on evaluation today patient actually presents for follow-up here in our clinic we have not seen her since actually last year which was July 8 of 2019. With that being said she is still having issues with bilateral lower extremity wounds. The right is definitely worse than the left but both are significant. She has pressure ulcers to both heels  along with the open lymphedema/venous ulcers to her legs. Unfortunately she is having significant pain she also appears to have severe cellulitis in my opinion especially on the right which is extending all the way up to her knee and seems to be tracking such from the wounds down below. There is no signs of obvious systemic infection such as sepsis but nonetheless she has had this it seems like for quite some time. There definitely is some signs of dementia or at least not fully understanding exactly what is going on though I think the patient is able to tell me the truth about her pain I think she is in tremendous pain. She initially told us that she was not going to allow anybody to touch her. However we discussed with her that we did need to have a look at the wounds and we would have to do a few things in order to try to help her she was cooperative and actually did very well. She just had to be somewhat coached and what was going on and not just have something sprung on her. With that being said she is also very much able to lift up her legs to let me look at all the areas she did that on her own and was very helpful in that regard. 12/30-Patient is back after 2 weeks from the facility where she is getting care for her wounds but she says she does her own wound cleaning, she has pressure ulcers and venous ulcers to her legs that unfortunately have been nonhealing and progressive, she was hospitalized for 2 days with cellulitis on the right which is  the worst side and sent out on Augmentin which she is completing. The wound on the right leg look pretty bad there is no significant inflammation noted in the surrounding skin 09/09/2019 on evaluation today patient unfortunately continues to have significant pain in regard to her lower extremities. She has been tolerating dressing changes without complication. Fortunately there is no signs of active infection at this time. With that being said she  has really somewhat poor blood flow in regard to her lower extremities. She has significant edema especially on the right but even on the left she is experiencing this as well. Unfortunately overall I am very concerned about the fact that this may not be something that actually can heal. 09/23/2019 on evaluation today patient unfortunately appears to be doing about the same. There is no signs of active systemic infection although she has significant wounds. I did question whether or not we should do a culture today of the wound area on the right lower extremity. The patient however does not want to do any cultures at this point. Last week we actually did recommend a light Coban over top of the con form to be performed at the facility as well that was actually 2 weeks ago when I saw her. Nonetheless they did not do that the doctor the facility change the orders because apparently she was crying out too much when they were trying to put on the Coban. Overall she does not seem to be doing too well in my opinion. Things again are about the same but at the same time she still is talking about the possibility of amputation. 11/05/2019; this is a patient was at [facility] on an ongoing basis. She came in today with bedbugs however we did look at her. She has severe venous hypertension with lymphedema. I believe I am seeing her in the past but Allen Derry has been seeing her since December. She has circumferential venous wounds on the right leg, similar wounds on the left leg. She has bilateral areas on the Achilles part of her heels. She complains of a lot of pain and the only thing that she will tolerate apparently is Kerlix and Coban. We have been using silver alginate. She talks like somebody that she sees "my doctors" are talking about a right BKA but she is not sure who this is. Electronic Signature(s) Signed: 11/07/2019 7:03:15 AM By: Baltazar Najjar MD Entered By: Baltazar Najjar on 11/05/2019  10:19:42 -------------------------------------------------------------------------------- Physical Exam Details Patient Name: Date of Service: [name]. 11/05/2019 9:15 AM Medical Record [id number] Patient Account Number: [id number] Date of Birth/Sex: Treating RN: [birth year] (69 y.o. Arta Silence Primary Care Provider: Luis Abed Other Clinician: Referring Provider: Treating Provider/Extender:Calahan Pak, Kenard Gower, Renaldo Fiddler in Treatment: 12 Constitutional Patient is hypotensive.. Pulse regular and within target range for patient.Marland Kitchen Respirations regular, non-labored and within target range.. Temperature is normal and within the target range for the patient.Marland Kitchen Appears in no distress. Cardiovascular Pedal pulses are palpable with posterior tibial and dorsalis pedis.. Notes Wound exam; the patient could not even tolerate washing these wounds off with Anasept and gauze. In reality we would need a significant ongoing debridement. The wounds on the right are circumferential for the most part. Deep wounds. There is still a lot of circumferential soft tissue around the wounds complicated by lymphedema. She has pressure ulcers on both heels Electronic Signature(s) Signed: 11/07/2019 7:03:15 AM By: Baltazar Najjar MD Entered By: Baltazar Najjar on 11/05/2019 10:22:08 -------------------------------------------------------------------------------- Physician Orders Details  Patient Name: Date of Service: [name], [name] 11/05/2019 9:15 AM Medical Record [id number] Patient Account Number: [id number] Date of Birth/Sex: Treating RN: 1951-01-21 (69 y.o. Debara Pickett, Millard.Loa Primary Care Provider: Luis Abed Other Clinician: Referring Provider: Treating Provider/Extender:Samatha Anspach, Kenard Gower, Renaldo Fiddler in Treatment: 23 Verbal / Phone Orders: No Diagnosis Coding ICD-10 Coding Code Description I89.0 Lymphedema, not elsewhere classified L97.822  Non-pressure chronic ulcer of other part of left lower leg with fat layer exposed L97.812 Non-pressure chronic ulcer of other part of right lower leg with fat layer exposed L89.620 Pressure ulcer of left heel, unstageable L89.610 Pressure ulcer of right heel, unstageable Follow-up Appointments Return Appointment in: - patient to return once bedbugs are clear. [place] to call when cleared to return. Dressing Change Frequency Change dressing three times week. - and/or as needed for drainage Skin Barriers/Peri-Wound Care Barrier cream - to periwounds as needed Moisturizing lotion - to dry skin Wound Cleansing Clean wound with Normal Saline. Primary Wound Dressing Wound #3 Right Calcaneus Calcium Alginate with Silver Wound #4 Left,Lateral Calcaneus Calcium Alginate with Silver Wound #5 Right,Circumferential Lower Leg Calcium Alginate with Silver Wound #6 Left,Circumferential Lower Leg Calcium Alginate with Silver Secondary Dressing Wound #3 Right Calcaneus Kerlix/Rolled Gauze Dry Gauze Heel Cup Wound #4 Left,Lateral Calcaneus Kerlix/Rolled Gauze Dry Gauze Heel Cup Wound #5 Right,Circumferential Lower Leg ABD pad Wound #6 Left,Circumferential Lower Leg ABD pad Edema Control Unna Boots Bilaterally Avoid standing for long periods of time Elevate legs to the level of the heart or above for 30 minutes daily and/or when sitting, a frequency of: - thoughout the day Exercise regularly Off-Loading Turn and reposition every 2 hours Other: - float heels off bed with pillows under calves Electronic Signature(s) Signed: 11/05/2019 6:00:33 PM By: Shawn Stall Signed: 11/07/2019 7:03:15 AM By: Baltazar Najjar MD Entered By: Shawn Stall on 11/05/2019 10:14:15 -------------------------------------------------------------------------------- Problem List Details Patient Name: Date of Service: [name]. 11/05/2019 9:15 AM Medical Record [id number] Patient Account Number:  [id number] Date of Birth/Sex: Treating RN: Oct 13, 1950 (69 y.o. Debara Pickett, Millard.Loa Primary Care Provider: Luis Abed Other Clinician: Referring Provider: Treating Provider/Extender:Deslyn Cavenaugh, Kenard Gower, Renaldo Fiddler in Treatment: 12 Active Problems ICD-10 Evaluated Encounter Code Description Active Date Today Diagnosis I89.0 Lymphedema, not elsewhere classified 08/12/2019 No Yes L97.822 Non-pressure chronic ulcer of other part of left lower 08/12/2019 No Yes leg with fat layer exposed L97.812 Non-pressure chronic ulcer of other part of right lower 08/12/2019 No Yes leg with fat layer exposed L89.620 Pressure ulcer of left heel, unstageable 08/12/2019 No Yes L89.610 Pressure ulcer of right heel, unstageable 08/12/2019 No Yes Inactive Problems Resolved Problems Electronic Signature(s) Signed: 11/07/2019 7:03:15 AM By: Baltazar Najjar MD Entered By: Baltazar Najjar on 11/05/2019 10:15:45 -------------------------------------------------------------------------------- Progress Note Details Patient Name: Date of Service: [name]. 11/05/2019 9:15 AM Medical Record [id number] Patient Account Number: [id number] Date of Birth/Sex: Treating RN: 1951/06/06 (69 y.o. Arta Silence Primary Care Provider: Luis Abed Other Clinician: Referring Provider: Treating Provider/Extender:Rella Egelston, Kenard Gower, Renaldo Fiddler in Treatment: 12 Subjective History of Present Illness (HPI) ADMISSION 03/03/18 This is a 69 year old woman who is here for review of wounds on her bilateral lower legs which she states of been there for 8 months. She is not able to give a really great history about how they started simply thinks that they showed up 1 day. She apparently has a history of weeping bilateral lower extremity edema probably chronic venous insufficiency plus or minus lymphedema. She was seen on 02/05/18 at her primary  physician in [facility] family practice noted to  have a large wound on the right leg a smaller wound on the left. She was given Unna boots and 10 days of Keflex. Previous to this she had been in the ER on several occasions in February for review of these wounds. The patient is not a diabetic however she does smoke. Past medical history includes atrial fibrillation, anemia, bipolar disorder, We were not able to get an ABI in our clinic today as the patient could not stand it. She comes into the clinic without anything on the wounds. She has advanced home care at home although I'm really not certain what they've been putting on this Readmission: 08/12/2019 on evaluation today patient actually presents for follow-up here in our clinic we have not seen her since actually last year which was July 8 of 2019. With that being said she is still having issues with bilateral lower extremity wounds. The right is definitely worse than the left but both are significant. She has pressure ulcers to both heels along with the open lymphedema/venous ulcers to her legs. Unfortunately she is having significant pain she also appears to have severe cellulitis in my opinion especially on the right which is extending all the way up to her knee and seems to be tracking such from the wounds down below. There is no signs of obvious systemic infection such as sepsis but nonetheless she has had this it seems like for quite some time. There definitely is some signs of dementia or at least not fully understanding exactly what is going on though I think the patient is able to tell me the truth about her pain I think she is in tremendous pain. She initially told us that she was not going to allow anybody to touch her. However we discussed with her that we did need to have a look at the wounds and we would have to do a few things in order to try to help her she was cooperative and actually did very well. She just had to be somewhat coached and what was going on and not just have  something sprung on her. With that being said she is also very much able to lift up her legs to let me look at all the areas she did that on her own and was very helpful in that regard. 12/30-Patient is back after 2 weeks from the facility where she is getting care for her wounds but she says she does her own wound cleaning, she has pressure ulcers and venous ulcers to her legs that unfortunately have been nonhealing and progressive, she was hospitalized for 2 days with cellulitis on the right which is the worst side and sent out on Augmentin which she is completing. The wound on the right leg look pretty bad there is no significant inflammation noted in the surrounding skin 09/09/2019 on evaluation today patient unfortunately continues to have significant pain in regard to her lower extremities. She has been tolerating dressing changes without complication. Fortunately there is no signs of active infection at this time. With that being said she has really somewhat poor blood flow in regard to her lower extremities. She has significant edema especially on the right but even on the left she is experiencing this as well. Unfortunately overall I am very concerned about the fact that this may not be something that actually can heal. 09/23/2019 on evaluation today patient unfortunately appears to be doing about the same. There is no signs  of active systemic infection although she has significant wounds. I did question whether or not we should do a culture today of the wound area on the right lower extremity. The patient however does not want to do any cultures at this point. Last week we actually did recommend a light Coban over top of the con form to be performed at the facility as well that was actually 2 weeks ago when I saw her. Nonetheless they did not do that the doctor the facility change the orders because apparently she was crying out too much when they were trying to put on the Coban. Overall  she does not seem to be doing too well in my opinion. Things again are about the same but at the same time she still is talking about the possibility of amputation. 11/05/2019; this is a patient was at [facility] on an ongoing basis. She came in today with bedbugs however we did look at her. She has severe venous hypertension with lymphedema. I believe I am seeing her in the past but Allen Derry has been seeing her since December. She has circumferential venous wounds on the right leg, similar wounds on the left leg. She has bilateral areas on the Achilles part of her heels. She complains of a lot of pain and the only thing that she will tolerate apparently is Kerlix and Coban. We have been using silver alginate. She talks like somebody that she sees "my doctors" are talking about a right BKA but she is not sure who this is. Objective Constitutional Patient is hypotensive.. Pulse regular and within target range for patient.Marland Kitchen Respirations regular, non-labored and within target range.. Temperature is normal and within the target range for the patient.Marland Kitchen Appears in no distress. Vitals Time Taken: 9:30 AM, Height: 66 in, Weight: 178 lbs, BMI: 28.7, Temperature: 98.2 F, Pulse: 69 bpm, Respiratory Rate: 19 breaths/min, Blood Pressure: 96/52 mmHg. Cardiovascular Pedal pulses are palpable with posterior tibial and dorsalis pedis.. General Notes: Wound exam; the patient could not even tolerate washing these wounds off with Anasept and gauze. In reality we would need a significant ongoing debridement. The wounds on the right are circumferential for the most part. Deep wounds. There is still a lot of circumferential soft tissue around the wounds complicated by lymphedema. She has pressure ulcers on both heels Integumentary (Hair, Skin) Wound #3 status is Open. Original cause of wound was Not Known. The wound is located on the Right Calcaneus. The wound measures 1cm length x 1.6cm width x 0.2cm depth;  1.257cm^2 area and 0.251cm^3 volume. There is no tunneling or undermining noted. There is a medium amount of purulent drainage noted. The wound margin is distinct with the outline attached to the wound base. There is small (1-33%) pink granulation within the wound bed. There is a large (67-100%) amount of necrotic tissue within the wound bed including Adherent Slough. Wound #4 status is Open. Original cause of wound was Not Known. The wound is located on the Left,Lateral Calcaneus. The wound measures 1.6cm length x 1.2cm width x 0.3cm depth; 1.508cm^2 area and 0.452cm^3 volume. There is no tunneling or undermining noted. There is a medium amount of purulent drainage noted. The wound margin is distinct with the outline attached to the wound base. There is small (1-33%) red, pink granulation within the wound bed. There is a large (67-100%) amount of necrotic tissue within the wound bed including Eschar and Adherent Slough. Wound #5 status is Open. Original cause of wound was Blister.  The wound is located on the Right,Circumferential Lower Leg. The wound measures 21.5cm length x 26.5cm width x 0.3cm depth; 447.481cm^2 area and 134.244cm^3 volume. There is Fat Layer (Subcutaneous Tissue) Exposed exposed. There is no tunneling or undermining noted. There is a large amount of purulent drainage noted. Foul odor after cleansing was noted. The wound margin is distinct with the outline attached to the wound base. There is small (1-33%) pink granulation within the wound bed. There is a large (67-100%) amount of necrotic tissue within the wound bed including Adherent Slough. Wound #6 status is Open. Original cause of wound was Blister. The wound is located on the Left,Circumferential Lower Leg. The wound measures 14.5cm length x 13.5cm width x 0.2cm depth; 153.742cm^2 area and 30.748cm^3 volume. There is Fat Layer (Subcutaneous Tissue) Exposed exposed. There is no tunneling or undermining noted. There is a  large amount of purulent drainage noted. Foul odor after cleansing was noted. The wound margin is distinct with the outline attached to the wound base. There is small (1-33%) pink granulation within the wound bed. There is a large (67-100%) amount of necrotic tissue within the wound bed including Adherent Slough. Assessment Active Problems ICD-10 Lymphedema, not elsewhere classified Non-pressure chronic ulcer of other part of left lower leg with fat layer exposed Non-pressure chronic ulcer of other part of right lower leg with fat layer exposed Pressure ulcer of left heel, unstageable Pressure ulcer of right heel, unstageable Procedures Wound #5 Pre-procedure diagnosis of Wound #5 is a Venous Leg Ulcer located on the Right,Circumferential Lower Leg . There was a Radio broadcast assistantUnna Boot Compression Therapy Procedure by Zenaida DeedBoehlein, Linda, RN. Post procedure Diagnosis Wound #5: Same as Pre-Procedure Wound #6 Pre-procedure diagnosis of Wound #6 is a Venous Leg Ulcer located on the Left,Circumferential Lower Leg . There was a Radio broadcast assistantUnna Boot Compression Therapy Procedure by Zenaida DeedBoehlein, Linda, RN. Post procedure Diagnosis Wound #6: Same as Pre-Procedure Plan Follow-up Appointments: Return Appointment in: - patient to return once bedbugs are clear. [place] to call when cleared to return. Dressing Change Frequency: Change dressing three times week. - and/or as needed for drainage Skin Barriers/Peri-Wound Care: Barrier cream - to periwounds as needed Moisturizing lotion - to dry skin Wound Cleansing: Clean wound with Normal Saline. Primary Wound Dressing: Wound #3 Right Calcaneus: Calcium Alginate with Silver Wound #4 Left,Lateral Calcaneus: Calcium Alginate with Silver Wound #5 Right,Circumferential Lower Leg: Calcium Alginate with Silver Wound #6 Left,Circumferential Lower Leg: Calcium Alginate with Silver Secondary Dressing: Wound #3 Right Calcaneus: Kerlix/Rolled Gauze Dry Gauze Heel Cup Wound #4  Left,Lateral Calcaneus: Kerlix/Rolled Gauze Dry Gauze Heel Cup Wound #5 Right,Circumferential Lower Leg: ABD pad Wound #6 Left,Circumferential Lower Leg: ABD pad Edema Control: Unna Boots Bilaterally Avoid standing for long periods of time Elevate legs to the level of the heart or above for 30 minutes daily and/or when sitting, a frequency of: - thoughout the day Exercise regularly Off-Loading: Turn and reposition every 2 hours Other: - float heels off bed with pillows under calves 1. Large circumferential wounds on the right lower extremity and on the left posterior. 2. Smaller wounds on the heels bilaterally 3. I do not think there is a major arterial issue here pedal pulses are palpable bilaterally. 4. I gave her the option of trying Unna boots to see if we can get better degrees of edema control here. 5. Although she is unable to tell me who suggested the possibility of amputation and who is following her if this were option I can  imagine that it might not be a bad option especially on the right. I have no doubt that these would be very painful. 6. There is no way we could do an outpatient debridement here. Electronic Signature(s) Signed: 11/07/2019 7:03:15 AM By: Baltazar Najjar MD Entered By: Baltazar Najjar on 11/05/2019 10:23:27 -------------------------------------------------------------------------------- SuperBill Details Patient Name: Date of Service: [name]. 11/05/2019 Medical Record [id number] Patient Account Number: [id number] Date of Birth/Sex: Treating RN: Nov 29, 1950 (69 y.o. Debara Pickett, Millard.Loa Primary Care Provider: Luis Abed Other Clinician: Referring Provider: Treating Provider/Extender:Marcia Lepera, Kenard Gower, Renaldo Fiddler in Treatment: 12 Diagnosis Coding ICD-10 Codes Code Description I89.0 Lymphedema, not elsewhere classified L97.822 Non-pressure chronic ulcer of other part of left lower leg with fat layer exposed L97.812  Non-pressure chronic ulcer of other part of right lower leg with fat layer exposed L89.620 Pressure ulcer of left heel, unstageable L89.610 Pressure ulcer of right heel, unstageable Facility Procedures CPT4 Code: [id number] Description: 29580 - APPLY UNNA BOOT/PROFO BILATERAL Modifier: Quantity: 1 Physician Procedures CPT4 Code Description: [id number] - WC PHYS LEVEL 3 - EST PT ICD-10 Diagnosis Description I89.0 Lymphedema, not elsewhere classified L97.822 Non-pressure chronic ulcer of other part of left lower l L97.812 Non-pressure chronic ulcer of other part of  right lower L89.610 Pressure ulcer of right heel, unstageable Modifier: eg with fat lay leg with fat la Quantity: 1 er exposed yer exposed Electronic Signature(s) Signed: 11/07/2019 7:03:15 AM By: Baltazar Najjar MD Entered By: Baltazar Najjar on 11/05/2019 10:23:47

## 2019-11-09 ENCOUNTER — Other Ambulatory Visit: Payer: Self-pay

## 2019-11-09 ENCOUNTER — Emergency Department (HOSPITAL_COMMUNITY)
Admission: EM | Admit: 2019-11-09 | Discharge: 2019-11-10 | Disposition: A | Discharge status: Home / Self Care | Payer: Medicare Other | Attending: Emergency Medicine | Admitting: Emergency Medicine

## 2019-11-09 ENCOUNTER — Encounter (HOSPITAL_COMMUNITY): Payer: Self-pay

## 2019-11-09 DIAGNOSIS — Z79899 Other long term (current) drug therapy: Secondary | ICD-10-CM | POA: Diagnosis not present

## 2019-11-09 DIAGNOSIS — R21 Rash and other nonspecific skin eruption: Secondary | ICD-10-CM | POA: Insufficient documentation

## 2019-11-09 DIAGNOSIS — E119 Type 2 diabetes mellitus without complications: Secondary | ICD-10-CM | POA: Diagnosis not present

## 2019-11-09 DIAGNOSIS — F17200 Nicotine dependence, unspecified, uncomplicated: Secondary | ICD-10-CM | POA: Diagnosis not present

## 2019-11-09 LAB — CBC WITH DIFFERENTIAL/PLATELET
Abs Immature Granulocytes: 0.03 10*3/uL (ref 0.00–0.07)
Basophils Absolute: 0.1 10*3/uL (ref 0.0–0.1)
Basophils Relative: 1 %
Eosinophils Absolute: 0.1 10*3/uL (ref 0.0–0.5)
Eosinophils Relative: 1 %
HCT: 31.3 % — ABNORMAL LOW (ref 36.0–46.0)
Hemoglobin: 8.8 g/dL — ABNORMAL LOW (ref 12.0–15.0)
Immature Granulocytes: 0 %
Lymphocytes Relative: 12 %
Lymphs Abs: 1.1 10*3/uL (ref 0.7–4.0)
MCH: 25 pg — ABNORMAL LOW (ref 26.0–34.0)
MCHC: 28.1 g/dL — ABNORMAL LOW (ref 30.0–36.0)
MCV: 88.9 fL (ref 80.0–100.0)
Monocytes Absolute: 0.9 10*3/uL (ref 0.1–1.0)
Monocytes Relative: 9 %
Neutro Abs: 7.6 10*3/uL (ref 1.7–7.7)
Neutrophils Relative %: 77 %
Platelets: 279 10*3/uL (ref 150–400)
RBC: 3.52 MIL/uL — ABNORMAL LOW (ref 3.87–5.11)
RDW: 17 % — ABNORMAL HIGH (ref 11.5–15.5)
WBC: 9.8 10*3/uL (ref 4.0–10.5)
nRBC: 0 % (ref 0.0–0.2)

## 2019-11-09 LAB — COMPREHENSIVE METABOLIC PANEL
ALT: 15 U/L (ref 0–44)
AST: 22 U/L (ref 15–41)
Albumin: 2.7 g/dL — ABNORMAL LOW (ref 3.5–5.0)
Alkaline Phosphatase: 118 U/L (ref 38–126)
Anion gap: 8 (ref 5–15)
BUN: 27 mg/dL — ABNORMAL HIGH (ref 8–23)
CO2: 27 mmol/L (ref 22–32)
Calcium: 8.9 mg/dL (ref 8.9–10.3)
Chloride: 105 mmol/L (ref 98–111)
Creatinine, Ser: 1.01 mg/dL — ABNORMAL HIGH (ref 0.44–1.00)
GFR calc Af Amer: >60 mL/min (ref >60)
GFR calc non Af Amer: 57 mL/min — ABNORMAL LOW (ref >60)
Glucose, Bld: 109 mg/dL — ABNORMAL HIGH (ref 70–99)
Potassium: 4.2 mmol/L (ref 3.5–5.1)
Sodium: 140 mmol/L (ref 135–145)
Total Bilirubin: 0.9 mg/dL (ref 0.3–1.2)
Total Protein: 7.5 g/dL (ref 6.5–8.1)

## 2019-11-09 MED ORDER — ALBUTEROL SULFATE HFA 108 (90 BASE) MCG/ACT IN AERS
2.0000 | INHALATION_SPRAY | Freq: Once | RESPIRATORY_TRACT | Status: AC
  Administered 2019-11-09: 2 via RESPIRATORY_TRACT
  Filled 2019-11-09: qty 6.7

## 2019-11-09 MED ORDER — HYDROXYZINE HCL 25 MG PO TABS: 25.0000 mg | ORAL_TABLET | Freq: Three times a day (TID) | ORAL | 0 refills | Status: DC | PRN

## 2019-11-09 MED ORDER — HYDROMORPHONE HCL 1 MG/ML IJ SOLN
1.0000 mg | Freq: Once | INTRAMUSCULAR | Status: AC
  Administered 2019-11-09: 1 mg via INTRAMUSCULAR
  Filled 2019-11-09: qty 1

## 2019-11-09 NOTE — Discharge Instructions (Addendum)
Follow-up with your doctor as planned next week for your legs.  Take the medicine for itching

## 2019-11-09 NOTE — ED Triage Notes (Signed)
Pt arrived via [facility] from [facility] center CC burning itching's of skin on bilateral LL. Facility reported " when pt was seen at [facility] last week the wrong ointment was placed, Unna, and this is what caused the itching" .   Per EMS A/OX4, ambulatory with walker.   Hx chronic ulcers of LL wounds.   VSS Afebrile

## 2019-11-09 NOTE — ED Notes (Signed)
[facility] called for transport.  

## 2019-11-10 NOTE — ED Provider Notes (Signed)
[facility]-EMERGENCY DEPT Provider Note   CSN: [id number] Arrival date & time: 11/09/19  1509     History Chief Complaint  Patient presents with  . Lower Leg Wounds    Possible allergic reaction     [name] is a 69 y.o. female.  Patient has ulcers on both lower legs that she has been cared for by Dr. Leanord Hawking.  She just saw him within the last couple days and she had an Radio broadcast assistant placed on.  Patient complains of itching to legs  The history is provided by the patient. No language interpreter was used.  Rash Location:  Leg Leg rash location:  L leg and R leg Quality: not blistering   Severity:  Mild Onset quality:  Gradual Timing:  Constant Progression:  Worsening Chronicity:  New Context: not animal contact   Associated symptoms: no abdominal pain, no diarrhea, no fatigue and no headaches        Past Medical History:  Diagnosis Date  . Arthritis   . Hypoglycemia   . Pneumonia, pneumococcal [facility])     Patient Active Problem List   Diagnosis Date Noted  . Severe protein-calorie malnutrition (HCC)   . Venous stasis ulcers of both lower extremities (HCC)   . Lower extremity cellulitis 08/12/2019  . 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 Pressure injury of skin 06/19/2019  . Cellulitis of left lower extremity   . Elevated C-reactive protein (CRP)   . Cellulitis of right lower extremity 06/18/2019  . Leg ulcer (HCC)   . Adjustment disorder with mixed disturbance of emotions and conduct   . Type 2 diabetes mellitus with hyperglycemia (HCC) 05/04/2019  . AKI (acute kidney injury) (HCC)   . Infected ulcer of skin (HCC)   . High risk social situation   . Post-polio syndrome   . Chronic pain syndrome   . Limited mobility 04/20/2019  . Venous stasis ulcer (HCC) 03/31/2019  . Overgrown toenails 10/28/2018  . Wound of right leg 02/06/2018  . Atrial fibrillation (HCC) 01/03/2018  . Anemia 01/03/2018  . Elevated serum  creatinine 01/03/2018  . Bipolar I disorder with mania (HCC) 01/03/2018    Past Surgical History:  Procedure Laterality Date  . ABDOMINAL HYSTERECTOMY    . CESAREAN SECTION       OB History    Gravida      Para      Term      Preterm      AB      Living  3     SAB      TAB      Ectopic      Multiple      Live Births              History reviewed. No pertinent family history.  Social History   Tobacco Use  . Smoking status: Heavy Tobacco Smoker    Packs/day: 0.50  . Smokeless tobacco: Never Used  Substance Use Topics  . Alcohol use: No  . Drug use: No    Home Medications Prior to Admission medications   Medication Sig Start Date End Date Taking? Authorizing Provider  DULoxetine HCl 40 MG CPEP Take 40 mg by mouth daily. 06/29/19   Myrene Buddy, MD  gabapentin (NEURONTIN) 300 MG capsule Take 2 capsules (600 mg total) by mouth 3 (three) times daily. 06/29/19 07/29/19  Myrene Buddy, MD  Hydrocortisone (GERHARDT'S BUTT CREAM) CREA Apply 1 application topically 2 (two) times  daily. 06/28/19   Wilber Oliphant, MD  hydrOXYzine (ATARAX/VISTARIL) 25 MG tablet Take 1 tablet (25 mg total) by mouth every 8 (eight) hours as needed for itching. 11/09/19   Milton Ferguson, MD  metoprolol tartrate (LOPRESSOR) 25 MG tablet Take 0.5 tablets (12.5 mg total) by mouth 2 (two) times daily. 06/29/19 08/12/19  Guadalupe Dawn, MD  naproxen sodium (ALEVE) 220 MG tablet Take 220 mg by mouth daily as needed (pain).    [provider]  furosemide (LASIX) 20 MG tablet Take 0.5 tablets (10 mg total) by mouth daily. Patient not taking: Reported on 04/18/2018 12/10/17 05/04/19  [name], PA-C    Allergies    Codeine and Tylenol [acetaminophen]  Review of Systems   Review of Systems  Constitutional: Negative for appetite change and fatigue.  HENT: Negative for congestion, ear discharge and sinus pressure.   Eyes: Negative for discharge.  Respiratory: Negative for cough.    Cardiovascular: Negative for chest pain.  Gastrointestinal: Negative for abdominal pain and diarrhea.  Genitourinary: Negative for frequency and hematuria.  Musculoskeletal: Negative for back pain.  Skin: Positive for rash.       Itching and ulcers to leg  Neurological: Negative for seizures and headaches.  Psychiatric/Behavioral: Negative for hallucinations.    Physical Exam Updated Vital Signs BP 138/72   Pulse 81   Temp 98.8 F (37.1 C)   Resp 16   SpO2 90%   Physical Exam Vitals and nursing note reviewed.  Constitutional:      Appearance: She is well-developed.  HENT:     Head: Normocephalic.     Nose: Nose normal.  Eyes:     General: No scleral icterus.    Conjunctiva/sclera: Conjunctivae normal.  Neck:     Thyroid: No thyromegaly.  Cardiovascular:     Rate and Rhythm: Normal rate and regular rhythm.     Heart sounds: No murmur. No friction rub. No gallop.   Pulmonary:     Breath sounds: No stridor. No wheezing or rales.  Chest:     Chest wall: No tenderness.  Abdominal:     General: There is no distension.     Tenderness: There is no abdominal tenderness. There is no rebound.  Musculoskeletal:        General: Normal range of motion.     Cervical back: Neck supple.     Comments: Both legs have large ulcers that are being treated with medicated gauze and Unna boot.  No obvious infection seen  Lymphadenopathy:     Cervical: No cervical adenopathy.  Skin:    Findings: No erythema or rash.  Neurological:     Mental Status: She is alert and oriented to person, place, and time.     Motor: No abnormal muscle tone.     Coordination: Coordination normal.  Psychiatric:        Behavior: Behavior normal.     ED Results / Procedures / Treatments   Labs (all labs ordered are listed, but only abnormal results are displayed) Labs Reviewed  CBC WITH DIFFERENTIAL/PLATELET - Abnormal; Notable for the following components:      Result Value   RBC 3.52 (*)     Hemoglobin 8.8 (*)    HCT 31.3 (*)    MCH 25.0 (*)    MCHC 28.1 (*)    RDW 17.0 (*)    All other components within normal limits  COMPREHENSIVE METABOLIC PANEL - Abnormal; Notable for the following components:   Glucose, Bld 109 (*)  BUN 27 (*)    Creatinine, Ser 1.01 (*)    Albumin 2.7 (*)    GFR calc non Af Amer 57 (*)    All other components within normal limits    EKG None  Radiology No results found.  Procedures Procedures (including critical care time)  Medications Ordered in ED Medications  HYDROmorphone (DILAUDID) injection 1 mg (1 mg Intramuscular Given 11/09/19 1639)  albuterol (VENTOLIN HFA) 108 (90 Base) MCG/ACT inhaler 2 puff (2 puffs Inhalation Given 11/09/19 2319)    ED Course  I have reviewed the triage vital signs and the nursing notes.  Pertinent labs & imaging results that were available during my care of the patient were reviewed by me and considered in my medical decision making (see chart for details).    MDM Rules/Calculators/A&P                     Patient is given Vistaril for her itching in her leg and will continue to follow-up with Dr. Leanord Hawking for the ulcers and wound care Final Clinical Impression(s) / ED Diagnoses Final diagnoses:  Rash    Rx / DC Orders ED Discharge Orders         Ordered    hydrOXYzine (ATARAX/VISTARIL) 25 MG tablet  Every 8 hours PRN     11/09/19 1831           Bethann Berkshire, MD 11/10/19 1213

## 2019-12-09 NOTE — Progress Notes (Signed)
[name], [name] ([id number]) Visit Report for 09/09/2019 Arrival Information Details Patient Name: Date of Service: [name], [name] 09/09/2019 10:30 AM Medical Record [id number] Patient Account Number: [id number] Date of Birth/Sex: Treating RN: 06/21/1951 (69 y.o. F) Primary Care Cooper Stamp: [place] Constable Other Clinician: Referring Deedra Pro: Treating Linzee Depaul/Extender:Stone III, Nancie Neas, BAILEY Weeks in Treatment: 4 Visit Information History Since Last Visit Added or deleted any medications: No Patient Arrived: Wheel Chair Any new allergies or adverse reactions: No Arrival Time: 11:07 Had a fall or experienced change in No activities of daily living that may affect Accompanied By: self risk of falls: Transfer Assistance: None Signs or symptoms of abuse/neglect since last No Patient Identification Verified: Yes visito Secondary Verification Process Yes Hospitalized since last visit: No Completed: Implantable device outside of the clinic excluding No Patient Requires Transmission-Based No cellular tissue based products placed in the center Precautions: since last visit: Patient Has Alerts: No Has Dressing in Place as Prescribed: Yes Pain Present Now: Yes Electronic Signature(s) Signed: 12/09/2019 9:24:15 AM By: Sandre Kitty Entered By: Sandre Kitty on 09/09/2019 11:07:49 -------------------------------------------------------------------------------- Clinic Level of Care Assessment Details Patient Name: Date of Service: [name], [name] 09/09/2019 10:30 AM Medical Record [id number] Patient Account Number: [id number] Date of Birth/Sex: Treating RN: 1950-09-04 (69 y.o. Elam Dutch Primary Care Aldrin Engelhard: [place] Constable Other Clinician: Referring Monta Police: Treating Melvine Julin/Extender:Stone III, Nancie Neas, BAILEY Weeks in Treatment: 4 Clinic Level of Care Assessment Items TOOL 4 Quantity Score []  - Use when only an EandM is  performed on FOLLOW-UP visit 0 ASSESSMENTS - Nursing Assessment / Reassessment X - Reassessment of Co-morbidities (includes updates in patient status) 1 10 X - Reassessment of Adherence to Treatment Plan 1 5 ASSESSMENTS - Wound and Skin Assessment / Reassessment []  - Simple Wound Assessment / Reassessment - one wound 0 X - Complex Wound Assessment / Reassessment - multiple wounds 4 5 []  - Dermatologic / Skin Assessment (not related to wound area) 0 ASSESSMENTS - Focused Assessment X - Circumferential Edema Measurements - multi extremities 2 5 []  - Nutritional Assessment / Counseling / Intervention 0 X - Lower Extremity Assessment (monofilament, tuning fork, pulses) 1 5 []  - Peripheral Arterial Disease Assessment (using hand held doppler) 0 ASSESSMENTS - Ostomy and/or Continence Assessment and Care []  - Incontinence Assessment and Management 0 []  - Ostomy Care Assessment and Management (repouching, etc.) 0 PROCESS - Coordination of Care X - Simple Patient / Family Education for ongoing care 1 15 []  - Complex (extensive) Patient / Family Education for ongoing care 0 X - Staff obtains Programmer, systems, Records, Test Results / Process Orders 1 10 X - Staff telephones HHA, Nursing Homes / Clarify orders / etc 1 10 []  - Routine Transfer to another Facility (non-emergent condition) 0 []  - Routine Hospital Admission (non-emergent condition) 0 []  - New Admissions / Biomedical engineer / Ordering NPWT, Apligraf, etc. 0 []  - Emergency Hospital Admission (emergent condition) 0 X - Simple Discharge Coordination 1 10 []  - Complex (extensive) Discharge Coordination 0 PROCESS - Special Needs []  - Pediatric / Minor Patient Management 0 []  - Isolation Patient Management 0 []  - Hearing / Language / Visual special needs 0 []  - Assessment of Community assistance (transportation, D/C planning, etc.) 0 []  - Additional assistance / Altered mentation 0 []  - Support Surface(s) Assessment (bed, cushion, seat,  etc.) 0 INTERVENTIONS - Wound Cleansing / Measurement []  - Simple Wound Cleansing - one wound 0 X - Complex Wound Cleansing - multiple wounds 4 5 X - Wound  Imaging (photographs - any number of wounds) 1 5 []  - Wound Tracing (instead of photographs) 0 []  - Simple Wound Measurement - one wound 0 X - Complex Wound Measurement - multiple wounds 4 5 INTERVENTIONS - Wound Dressings []  - Small Wound Dressing one or multiple wounds 0 X - Medium Wound Dressing one or multiple wounds 2 15 []  - Large Wound Dressing one or multiple wounds 0 X - Application of Medications - topical 1 5 []  - Application of Medications - injection 0 INTERVENTIONS - Miscellaneous []  - External ear exam 0 []  - Specimen Collection (cultures, biopsies, blood, body fluids, etc.) 0 []  - Specimen(s) / Culture(s) sent or taken to Lab for analysis 0 []  - Patient Transfer (multiple staff / Lift / Similar devices) 0 []  - Simple Staple / Suture removal (25 or less) 0 []  - Complex Staple / Suture removal (26 or more) 0 []  - Hypo / Hyperglycemic Management (close monitor of Blood Glucose) 0 []  - Ankle / Brachial Index (ABI) - do not check if billed separately 0 X - Vital Signs 1 5 Has the patient been seen at the hospital within the last three years: Yes Total Score: 180 Level Of Care: New/Established - Level 5 Electronic Signature(s) Signed: 09/09/2019 6:34:17 PM By: RN, BSN Entered By: on 09/09/2019 12:05:26 -------------------------------------------------------------------------------- Encounter Discharge Information Details Patient Name: Date of Service: . 09/09/2019 10:30 AM Medical Record Patient Account Number: Date of Birth/Sex: Treating RN: 02-Nov-1950 (69 y.o. Primary Care Kilyn Maragh: Other Clinician: Referring Eliott Amparan: Treating Safiyya Stokes/Extender:Stone III, , BAILEY Weeks in  Treatment: 4 Encounter Discharge Information Items Discharge Condition: Stable Ambulatory Status: Wheelchair Discharge Destination: Home Transportation: Private Auto Accompanied By: alone Schedule Follow-up Appointment: Yes Clinical Summary of Care: Patient Declined Electronic Signature(s) Signed: 09/10/2019 6:31:26 PM By: Zenaida Deed RN, BSN Entered By: Zenaida Deed on 09/10/2019 07:25:56 -------------------------------------------------------------------------------- Lower Extremity Assessment Details Patient Name: Date of Service: [name]. 09/09/2019 10:30 AM Medical Record [id number] Patient Account Number: [id number] Date of Birth/Sex: Treating RN: [birth year] (69 y.o. Wynelle Link Primary Care Tenika Keeran: Luis Abed Other Clinician: Referring Milad Bublitz: Treating Reynalda Canny/Extender:Stone III, Lanier Ensign, BAILEY Weeks in Treatment: 4 Edema Assessment Assessed: [Left: No] [Right: No] Edema: [Left: Yes] [Right: Yes] Calf Left: Right: Point of Measurement: cm From Medial Instep 42 cm 43 cm Ankle Left: Right: Point of Measurement: cm From Medial Instep 26 cm 24 cm Vascular Assessment Pulses: Dorsalis Pedis Palpable: [Left:Yes] [Right:Yes] Electronic Signature(s) Signed: 09/10/2019 6:31:26 PM By: Zandra Abts RN, BSN Entered By: Zandra Abts on 09/09/2019 11:38:23 -------------------------------------------------------------------------------- Multi-Disciplinary Care Plan Details Patient Name: Date of Service: [name]. 09/09/2019 10:30 AM Medical Record [id number] Patient Account Number: [id number] Date of Birth/Sex: Treating RN: August 27, 1951 (69 y.o. Wynelle Link Primary Care Shaolin Armas: Luis Abed Other Clinician: Referring Gillian Kluever: Treating Kelon Easom/Extender:Stone III, Lanier Ensign, BAILEY Weeks in Treatment: 4 Active Inactive Venous Leg Ulcer Nursing Diagnoses: Knowledge deficit related to disease  process and management Potential for venous Insuffiency (use before diagnosis confirmed) Goals: Patient will maintain optimal edema control Date Initiated: 08/26/2019 Target Resolution Date: 09/23/2019 Goal Status: Active Patient/caregiver will verbalize understanding of disease process and disease management Date Initiated: 08/26/2019 Target Resolution Date: 09/23/2019 Goal Status: Active Interventions: Assess peripheral edema status every visit. Compression as ordered Treatment Activities: Therapeutic compression applied : 08/26/2019 Notes: Wound/Skin Impairment Nursing Diagnoses: Impaired tissue integrity Knowledge deficit related to ulceration/compromised skin integrity Goals: Patient/caregiver will  verbalize understanding of skin care regimen Date Initiated: 08/26/2019 Target Resolution Date: 09/23/2019 Goal Status: Active Ulcer/skin breakdown will have a volume reduction of 30% by week 4 Date Initiated: 08/26/2019 Target Resolution Date: 09/23/2019 Goal Status: Active Interventions: Assess patient/caregiver ability to obtain necessary supplies Assess patient/caregiver ability to perform ulcer/skin care regimen upon admission and as needed Assess ulceration(s) every visit Treatment Activities: Skin care regimen initiated : 08/26/2019 Topical wound management initiated : 08/26/2019 Notes: Electronic Signature(s) Signed: 09/09/2019 6:34:17 PM By: Zenaida Deed RN, BSN Entered By: Zenaida Deed on 09/09/2019 11:57:53 -------------------------------------------------------------------------------- Pain Assessment Details Patient Name: Date of Service: [name]. 09/09/2019 10:30 AM Medical Record [id number] Patient Account Number: [id number] Date of Birth/Sex: Treating RN: 11-14-1950 (69 y.o. F) Primary Care Bronislaus Verdell: Luis Abed Other Clinician: Referring Lurlie Wigen: Treating Allicia Culley/Extender:Stone III, Lanier Ensign, BAILEY Weeks in Treatment:  4 Active Problems Location of Pain Severity and Description of Pain Patient Has Paino Yes Site Locations Rate the pain. Current Pain Level: 10 Pain Management and Medication Current Pain Management: Electronic Signature(s) Signed: 12/09/2019 9:24:15 AM By: Karl Ito Entered By: Karl Ito on 09/09/2019 11:08:20 -------------------------------------------------------------------------------- Patient/Caregiver Education Details Patient Name: [name] 1/13/2021andnbsp10:30 Date of Service: AM Medical Record [id number] Number: Patient Account Number: [id number] Date of Birth/Gender: June 21, 1951 (68 y.o. F) Treating RN: Zenaida Deed Other ClinicianObie Dredge, Primary Care Physician: Leafy Ro Lenda Kelp Physician/Extender: Obie Dredge, Referring Physician: Renaldo Fiddler in Treatment: 4 Education Assessment Education Provided To: Patient Education Topics Provided Venous: Methods: Explain/Verbal Responses: Reinforcements needed, State content correctly Wound/Skin Impairment: Methods: Explain/Verbal Responses: Reinforcements needed, State content correctly Electronic Signature(s) Signed: 09/09/2019 6:34:17 PM By: Zenaida Deed RN, BSN Entered By: Zenaida Deed on 09/09/2019 11:58:14 -------------------------------------------------------------------------------- Wound Assessment Details Patient Name: Date of Service: [name]. 09/09/2019 10:30 AM Medical Record [id number] Patient Account Number: [id number] Date of Birth/Sex: Treating RN: 06-17-1951 (69 y.o. F) Primary Care Eirene Rather: Luis Abed Other Clinician: Referring Jaythan Hinely: Treating Kavontae Pritchard/Extender:Stone III, Lanier Ensign, BAILEY Weeks in Treatment: 4 Wound Status Wound Number: 3 Primary Etiology: Pressure Ulcer Wound Location: Right Calcaneus Wound Status: Open Wounding Event: Not Known Comorbid History: Anemia, Arrhythmia Date Acquired:  06/28/2019 Weeks Of Treatment: 4 Clustered Wound: No Photos Wound Measurements Length: (cm) 1.3 % Reductio Width: (cm) 2 % Reductio Depth: (cm) 0.1 Epithelial Area: (cm) 2.042 Tunneling Volume: (cm) 0.204 Undermini Wound Description Classification: Unstageable/Unclassified Wound Margin: Distinct, outline attached Exudate Amount: Medium Exudate Type: Purulent Exudate Color: yellow, brown, green Wound Bed Granulation Amount: Small (1-33%) Granulation Quality: Pink Necrotic Amount: Large (67-100%) Necrotic Quality: Adherent Slough Foul Odor After Cleansing: No Slough/Fibrino Yes Exposed Structure Fascia Exposed: No Fat Layer (Subcutaneous Tissue) Exposed: No Tendon Exposed: No Muscle Exposed: No Joint Exposed: No Bone Exposed: No n in Area: 38.8% n in Volume: 38.9% ization: Small (1-33%) : No ng: No Electronic Signature(s) Signed: 09/10/2019 3:33:56 PM By: Benjaman Kindler EMT/HBOT Entered By: Benjaman Kindler on 09/10/2019 09:57:02 -------------------------------------------------------------------------------- Wound Assessment Details Patient Name: Date of Service: [name]. 09/09/2019 10:30 AM Medical Record [id number] Patient Account Number: [id number] Date of Birth/Sex: Treating RN: [birth year] (69 y.o. F) Primary Care Aysia Lowder: Luis Abed Other Clinician: Referring Samaria Anes: Treating Barclay Lennox/Extender:Stone III, Lanier Ensign, BAILEY Weeks in Treatment: 4 Wound Status Wound Number: 4 Primary Etiology: Pressure Ulcer Wound Location: Left Calcaneus - Lateral Wound Status: Open Wounding Event: Not Known Comorbid History: Anemia, Arrhythmia Date Acquired: 06/28/2019 Weeks Of Treatment: 4 Clustered Wound: No Photos Wound Measurements Length: (cm) 2 Width: (cm) 2.3 Depth: (cm) 0.1 Area: (  cm) 3.613 Volume: (cm) 0.361 Wound Description Classification: Unstageable/Unclassified Wound Margin: Distinct, outline attached Exudate Amount:  Medium Exudate Type: Purulent Exudate Color: yellow, brown, green Wound Bed Granulation Amount: None Present (0%) Necrotic Amount: Large (67-100%) Necrotic Quality: Eschar, Adherent Slough After Cleansing: No brino No Exposed Structure osed: No (Subcutaneous Tissue) Exposed: No osed: No osed: No sed: No ed: No % Reduction in Area: 61.7% % Reduction in Volume: 61.7% Epithelialization: None Tunneling: No Undermining: No Foul Odor Slough/Fi Fascia Exp Fat Layer Tendon Exp Muscle Exp Joint Expo Bone Expos Electronic Signature(s) Signed: 09/10/2019 3:33:56 PM By: Benjaman KindlerJones, Dedrick EMT/HBOT Entered By: Benjaman KindlerJones, Dedrick on 09/10/2019 09:56:05 -------------------------------------------------------------------------------- Wound Assessment Details Patient Name: Date of Service: [name], Caroline M. 09/09/2019 10:30 AM Medical Record [id number] Patient Account Number: [id number] Date of Birth/Sex: Treating RN: 12/15/1950 (69 y.o. F) Primary Care Marqueta Pulley: Other Clinician: Luis AbedMECCARIELLO, BAILEY Referring Johanny Segers: Treating Etola Mull/Extender:Stone III, Lanier EnsignHoyt MECCARIELLO, BAILEY Weeks in Treatment: 4 Wound Status Wound Number: 5 Primary Etiology: Venous Leg Ulcer Wound Location: Right Lower Leg - Circumferential Wound Status: Open Wounding Event: Blister Comorbid History: Anemia, Arrhythmia Date Acquired: 03/28/2019 Weeks Of Treatment: 4 Clustered Wound: No Photos Wound Measurements Length: (cm) 22.5 % Reduct Width: (cm) 26.5 % Reduct Depth: (cm) 0.3 Epitheli Area: (cm) 468.294 Tunneli Volume: (cm) 140.488 Undermi Wound Description Classification: Full Thickness Without Exposed Support Structures Wound Distinct, outline attached Margin: Exudate Large Amount: Exudate Purulent Type: Exudate yellow, brown, green Color: Wound Bed Granulation Amount: Small (1-33%) Granulation Quality: Pink Necrotic Amount: Large (67-100%) Necrotic Quality: Adherent Slough Foul Odor  After Cleansing: No Slough/Fibrino Yes Exposed Structure Fascia Exposed: No Fat Layer (Subcutaneous Tissue) Exposed: Yes Tendon Exposed: No Muscle Exposed: No Joint Exposed: No Bone Exposed: No ion in Area: 44.4% ion in Volume: 16.6% alization: Small (1-33%) ng: No ning: No Electronic Signature(s) Signed: 09/10/2019 3:33:56 PM By: Benjaman KindlerJones, Dedrick EMT/HBOT Entered By: Benjaman KindlerJones, Dedrick on 09/10/2019 09:56:27 -------------------------------------------------------------------------------- Wound Assessment Details Patient Name: Date of Service: [name], Caroline M. 09/09/2019 10:30 AM Medical Record [id number] Patient Account Number: [id number] Date of Birth/Sex: Treating RN: 10/02/1950 (69 y.o. F) Primary Care Jesiah Yerby: Luis AbedMECCARIELLO, BAILEY Other Clinician: Referring Kienna Moncada: Treating Seanne Chirico/Extender:Stone III, Lanier EnsignHoyt MECCARIELLO, BAILEY Weeks in Treatment: 4 Wound Status Wound Number: 6 Primary Etiology: Venous Leg Ulcer Wound Location: Left Lower Leg - Circumferential Wound Status: Open Wounding Event: Blister Comorbid History: Anemia, Arrhythmia Date Acquired: 03/28/2019 Weeks Of Treatment: 4 Clustered Wound: Yes Photos Wound Measurements Length: (cm) 12.5 % Reduct Width: (cm) 16.5 % Reduct Depth: (cm) 0.2 Epitheli Clustered Quantity: 6 Tunnelin Area: (cm) 161.988 Undermi Volume: (cm) 32.398 Wound Description Classification: Full Thickness Without Exposed Support Foul Od Structures Slough/ Wound Distinct, outline attached Margin: Exudate Large Amount: Exudate Purulent Type: Exudate yellow, brown, green Color: Wound Bed Granulation Amount: Small (1-33%) Granulation Quality: Pink Fascia Necrotic Amount: Large (67-100%) Fat Lay Necrotic Quality: Adherent Slough Tendon Muscle Joint E Bone Expose or After Cleansing: No Fibrino Yes Exposed Structure Exposed: No er (Subcutaneous Tissue) Exposed: Yes Exposed: No Exposed: No xposed: No d: No ion in Area:  72.8% ion in Volume: 72.8% alization: Small (1-33%) g: No ning: No Electronic Signature(s) Signed: 09/10/2019 3:33:56 PM By: Benjaman KindlerJones, Dedrick EMT/HBOT Entered By: Benjaman KindlerJones, Dedrick on 09/10/2019 09:55:45 -------------------------------------------------------------------------------- Vitals Details Patient Name: Date of Service: [name], Caroline M. 09/09/2019 10:30 AM Medical Record [id number] Patient Account Number: [id number] Date of Birth/Sex: Treating RN: 04/05/1951 (69 y.o. F) Primary Care Tyquarius Paglia: Luis AbedMECCARIELLO, BAILEY Other Clinician: Referring Rhandi Despain: Treating Harveer Sadler/Extender:Stone III, Lanier EnsignHoyt MECCARIELLO, BAILEY Weeks in  Treatment: 4 Vital Signs Time Taken: 11:07 Temperature (F): 98.2 Height (in): 66 Pulse (bpm): 72 Weight (lbs): 178 Respiratory Rate (breaths/min): 16 Body Mass Index (BMI): 28.7 Blood Pressure (mmHg): 141/42 Reference Range: 80 - 120 mg / dl Electronic Signature(s) Signed: 12/09/2019 9:24:15 AM By: Karl Ito Entered By: Karl Ito on 09/09/2019 11:08:10

## 2019-12-21 ENCOUNTER — Inpatient Hospital Stay (HOSPITAL_COMMUNITY)
Admission: EM | Admit: 2019-12-21 | Discharge: 2019-12-29 | DRG: 540 | Disposition: A | Discharge status: Skilled Nursing Facility | Payer: Medicare Other | Source: Skilled Nursing Facility | Attending: Internal Medicine | Admitting: Internal Medicine

## 2019-12-21 ENCOUNTER — Other Ambulatory Visit: Payer: Self-pay

## 2019-12-21 ENCOUNTER — Encounter (HOSPITAL_COMMUNITY): Payer: Self-pay

## 2019-12-21 DIAGNOSIS — L03116 Cellulitis of left lower limb: Secondary | ICD-10-CM | POA: Diagnosis not present

## 2019-12-21 DIAGNOSIS — L97929 Non-pressure chronic ulcer of unspecified part of left lower leg with unspecified severity: Secondary | ICD-10-CM | POA: Diagnosis not present

## 2019-12-21 DIAGNOSIS — L03115 Cellulitis of right lower limb: Secondary | ICD-10-CM | POA: Diagnosis not present

## 2019-12-21 DIAGNOSIS — G14 Postpolio syndrome: Secondary | ICD-10-CM | POA: Diagnosis not present

## 2019-12-21 DIAGNOSIS — L97919 Non-pressure chronic ulcer of unspecified part of right lower leg with unspecified severity: Secondary | ICD-10-CM | POA: Diagnosis not present

## 2019-12-21 DIAGNOSIS — B965 Pseudomonas (aeruginosa) (mallei) (pseudomallei) as the cause of diseases classified elsewhere: Secondary | ICD-10-CM | POA: Diagnosis present

## 2019-12-21 DIAGNOSIS — Z66 Do not resuscitate: Secondary | ICD-10-CM | POA: Diagnosis not present

## 2019-12-21 DIAGNOSIS — D649 Anemia, unspecified: Secondary | ICD-10-CM | POA: Diagnosis not present

## 2019-12-21 DIAGNOSIS — M86661 Other chronic osteomyelitis, right tibia and fibula: Principal | ICD-10-CM | POA: Diagnosis present

## 2019-12-21 DIAGNOSIS — Z90710 Acquired absence of both cervix and uterus: Secondary | ICD-10-CM

## 2019-12-21 DIAGNOSIS — E1159 Type 2 diabetes mellitus with other circulatory complications: Secondary | ICD-10-CM | POA: Diagnosis present

## 2019-12-21 DIAGNOSIS — I872 Venous insufficiency (chronic) (peripheral): Secondary | ICD-10-CM | POA: Diagnosis not present

## 2019-12-21 DIAGNOSIS — Z20822 Contact with and (suspected) exposure to covid-19: Secondary | ICD-10-CM | POA: Diagnosis not present

## 2019-12-21 DIAGNOSIS — I10 Essential (primary) hypertension: Secondary | ICD-10-CM | POA: Diagnosis not present

## 2019-12-21 DIAGNOSIS — F17210 Nicotine dependence, cigarettes, uncomplicated: Secondary | ICD-10-CM | POA: Diagnosis present

## 2019-12-21 DIAGNOSIS — G894 Chronic pain syndrome: Secondary | ICD-10-CM | POA: Diagnosis present

## 2019-12-21 DIAGNOSIS — E1169 Type 2 diabetes mellitus with other specified complication: Secondary | ICD-10-CM | POA: Diagnosis not present

## 2019-12-21 DIAGNOSIS — I87313 Chronic venous hypertension (idiopathic) with ulcer of bilateral lower extremity: Secondary | ICD-10-CM | POA: Diagnosis not present

## 2019-12-21 DIAGNOSIS — M869 Osteomyelitis, unspecified: Secondary | ICD-10-CM | POA: Diagnosis present

## 2019-12-21 DIAGNOSIS — Z79899 Other long term (current) drug therapy: Secondary | ICD-10-CM | POA: Diagnosis not present

## 2019-12-21 DIAGNOSIS — I4891 Unspecified atrial fibrillation: Secondary | ICD-10-CM | POA: Diagnosis present

## 2019-12-21 DIAGNOSIS — F319 Bipolar disorder, unspecified: Secondary | ICD-10-CM | POA: Diagnosis present

## 2019-12-21 DIAGNOSIS — I83029 Varicose veins of left lower extremity with ulcer of unspecified site: Secondary | ICD-10-CM | POA: Diagnosis present

## 2019-12-21 DIAGNOSIS — M86261 Subacute osteomyelitis, right tibia and fibula: Secondary | ICD-10-CM

## 2019-12-21 DIAGNOSIS — E1142 Type 2 diabetes mellitus with diabetic polyneuropathy: Secondary | ICD-10-CM | POA: Diagnosis not present

## 2019-12-21 DIAGNOSIS — A498 Other bacterial infections of unspecified site: Secondary | ICD-10-CM

## 2019-12-21 DIAGNOSIS — Z791 Long term (current) use of non-steroidal anti-inflammatories (NSAID): Secondary | ICD-10-CM | POA: Diagnosis not present

## 2019-12-21 LAB — CBC WITH DIFFERENTIAL/PLATELET
Abs Immature Granulocytes: 0.01 10*3/uL (ref 0.00–0.07)
Basophils Absolute: 0.1 10*3/uL (ref 0.0–0.1)
Basophils Relative: 1 %
Eosinophils Absolute: 0.3 10*3/uL (ref 0.0–0.5)
Eosinophils Relative: 6 %
HCT: 29.2 % — ABNORMAL LOW (ref 36.0–46.0)
Hemoglobin: 8.5 g/dL — ABNORMAL LOW (ref 12.0–15.0)
Immature Granulocytes: 0 %
Lymphocytes Relative: 25 %
Lymphs Abs: 1.2 10*3/uL (ref 0.7–4.0)
MCH: 24.6 pg — ABNORMAL LOW (ref 26.0–34.0)
MCHC: 29.1 g/dL — ABNORMAL LOW (ref 30.0–36.0)
MCV: 84.6 fL (ref 80.0–100.0)
Monocytes Absolute: 0.5 10*3/uL (ref 0.1–1.0)
Monocytes Relative: 10 %
Neutro Abs: 2.8 10*3/uL (ref 1.7–7.7)
Neutrophils Relative %: 58 %
Platelets: 231 10*3/uL (ref 150–400)
RBC: 3.45 MIL/uL — ABNORMAL LOW (ref 3.87–5.11)
RDW: 17 % — ABNORMAL HIGH (ref 11.5–15.5)
WBC: 4.8 10*3/uL (ref 4.0–10.5)
nRBC: 0 % (ref 0.0–0.2)

## 2019-12-21 MED ORDER — SODIUM CHLORIDE 0.9% FLUSH: 3.0000 mL | Freq: Once | INTRAVENOUS | Status: DC

## 2019-12-21 NOTE — ED Notes (Signed)
Pt placed on purewick per request due to not able to get up and walk to restroom.

## 2019-12-21 NOTE — ED Triage Notes (Signed)
BIB EMS from [facility]. PT has hx of ulcer bilaterally in lower legs. PT stared experiencing pain in right lower leg today.  Right leg has black/grey tissue at top of wound and foul smell.  Pt denies hx of diabetes.

## 2019-12-22 ENCOUNTER — Inpatient Hospital Stay (HOSPITAL_COMMUNITY): Payer: Medicare Other

## 2019-12-22 ENCOUNTER — Encounter (HOSPITAL_COMMUNITY): Payer: Self-pay | Admitting: Internal Medicine

## 2019-12-22 ENCOUNTER — Emergency Department (HOSPITAL_COMMUNITY): Payer: Medicare Other

## 2019-12-22 DIAGNOSIS — F319 Bipolar disorder, unspecified: Secondary | ICD-10-CM | POA: Diagnosis not present

## 2019-12-22 DIAGNOSIS — Z791 Long term (current) use of non-steroidal anti-inflammatories (NSAID): Secondary | ICD-10-CM | POA: Diagnosis not present

## 2019-12-22 DIAGNOSIS — I83029 Varicose veins of left lower extremity with ulcer of unspecified site: Secondary | ICD-10-CM | POA: Diagnosis not present

## 2019-12-22 DIAGNOSIS — B965 Pseudomonas (aeruginosa) (mallei) (pseudomallei) as the cause of diseases classified elsewhere: Secondary | ICD-10-CM | POA: Diagnosis not present

## 2019-12-22 DIAGNOSIS — I872 Venous insufficiency (chronic) (peripheral): Secondary | ICD-10-CM | POA: Diagnosis not present

## 2019-12-22 DIAGNOSIS — L97929 Non-pressure chronic ulcer of unspecified part of left lower leg with unspecified severity: Secondary | ICD-10-CM | POA: Diagnosis not present

## 2019-12-22 DIAGNOSIS — I10 Essential (primary) hypertension: Secondary | ICD-10-CM | POA: Diagnosis not present

## 2019-12-22 DIAGNOSIS — Z20822 Contact with and (suspected) exposure to covid-19: Secondary | ICD-10-CM | POA: Diagnosis not present

## 2019-12-22 DIAGNOSIS — I83019 Varicose veins of right lower extremity with ulcer of unspecified site: Secondary | ICD-10-CM | POA: Diagnosis not present

## 2019-12-22 DIAGNOSIS — G894 Chronic pain syndrome: Secondary | ICD-10-CM | POA: Diagnosis not present

## 2019-12-22 DIAGNOSIS — D649 Anemia, unspecified: Secondary | ICD-10-CM | POA: Diagnosis not present

## 2019-12-22 DIAGNOSIS — I87313 Chronic venous hypertension (idiopathic) with ulcer of bilateral lower extremity: Secondary | ICD-10-CM | POA: Diagnosis not present

## 2019-12-22 DIAGNOSIS — Z90710 Acquired absence of both cervix and uterus: Secondary | ICD-10-CM | POA: Diagnosis not present

## 2019-12-22 DIAGNOSIS — L97919 Non-pressure chronic ulcer of unspecified part of right lower leg with unspecified severity: Secondary | ICD-10-CM | POA: Diagnosis not present

## 2019-12-22 DIAGNOSIS — F17210 Nicotine dependence, cigarettes, uncomplicated: Secondary | ICD-10-CM | POA: Diagnosis not present

## 2019-12-22 DIAGNOSIS — M86261 Subacute osteomyelitis, right tibia and fibula: Secondary | ICD-10-CM | POA: Diagnosis not present

## 2019-12-22 DIAGNOSIS — E1159 Type 2 diabetes mellitus with other circulatory complications: Secondary | ICD-10-CM | POA: Diagnosis present

## 2019-12-22 DIAGNOSIS — M869 Osteomyelitis, unspecified: Secondary | ICD-10-CM | POA: Diagnosis present

## 2019-12-22 DIAGNOSIS — Z79899 Other long term (current) drug therapy: Secondary | ICD-10-CM | POA: Diagnosis not present

## 2019-12-22 DIAGNOSIS — G14 Postpolio syndrome: Secondary | ICD-10-CM | POA: Diagnosis not present

## 2019-12-22 DIAGNOSIS — L03116 Cellulitis of left lower limb: Secondary | ICD-10-CM | POA: Diagnosis not present

## 2019-12-22 DIAGNOSIS — I4891 Unspecified atrial fibrillation: Secondary | ICD-10-CM | POA: Diagnosis not present

## 2019-12-22 DIAGNOSIS — E1142 Type 2 diabetes mellitus with diabetic polyneuropathy: Secondary | ICD-10-CM | POA: Diagnosis not present

## 2019-12-22 DIAGNOSIS — Z66 Do not resuscitate: Secondary | ICD-10-CM | POA: Diagnosis not present

## 2019-12-22 DIAGNOSIS — L03115 Cellulitis of right lower limb: Secondary | ICD-10-CM | POA: Diagnosis not present

## 2019-12-22 DIAGNOSIS — E1169 Type 2 diabetes mellitus with other specified complication: Secondary | ICD-10-CM | POA: Diagnosis not present

## 2019-12-22 DIAGNOSIS — M86661 Other chronic osteomyelitis, right tibia and fibula: Secondary | ICD-10-CM | POA: Diagnosis not present

## 2019-12-22 LAB — RESPIRATORY PANEL BY RT PCR (FLU A&B, COVID)
Influenza A by PCR: NEGATIVE
Influenza B by PCR: NEGATIVE
SARS Coronavirus 2 by RT PCR: NEGATIVE

## 2019-12-22 LAB — COMPREHENSIVE METABOLIC PANEL
ALT: 11 U/L (ref 0–44)
AST: 14 U/L — ABNORMAL LOW (ref 15–41)
Albumin: 3 g/dL — ABNORMAL LOW (ref 3.5–5.0)
Alkaline Phosphatase: 88 U/L (ref 38–126)
Anion gap: 8 (ref 5–15)
BUN: 24 mg/dL — ABNORMAL HIGH (ref 8–23)
CO2: 27 mmol/L (ref 22–32)
Calcium: 8.8 mg/dL — ABNORMAL LOW (ref 8.9–10.3)
Chloride: 103 mmol/L (ref 98–111)
Creatinine, Ser: 1.25 mg/dL — ABNORMAL HIGH (ref 0.44–1.00)
GFR calc Af Amer: 51 mL/min — ABNORMAL LOW (ref >60)
GFR calc non Af Amer: 44 mL/min — ABNORMAL LOW (ref >60)
Glucose, Bld: 110 mg/dL — ABNORMAL HIGH (ref 70–99)
Potassium: 4.1 mmol/L (ref 3.5–5.1)
Sodium: 138 mmol/L (ref 135–145)
Total Bilirubin: 0.5 mg/dL (ref 0.3–1.2)
Total Protein: 7.5 g/dL (ref 6.5–8.1)

## 2019-12-22 LAB — HIV ANTIBODY (ROUTINE TESTING W REFLEX): HIV Screen 4th Generation wRfx: NONREACTIVE

## 2019-12-22 LAB — SEDIMENTATION RATE: Sed Rate: 102 mm/hr — ABNORMAL HIGH (ref 0–22)

## 2019-12-22 LAB — HEMOGLOBIN A1C
Hgb A1c MFr Bld: 6.1 % — ABNORMAL HIGH (ref 4.8–5.6)
Mean Plasma Glucose: 128.37 mg/dL

## 2019-12-22 LAB — GLUCOSE, CAPILLARY: Glucose-Capillary: 91 mg/dL (ref 70–99)

## 2019-12-22 LAB — PREALBUMIN: Prealbumin: 12.5 mg/dL — ABNORMAL LOW (ref 18–38)

## 2019-12-22 LAB — CBG MONITORING, ED: Glucose-Capillary: 87 mg/dL (ref 70–99)

## 2019-12-22 LAB — C-REACTIVE PROTEIN: CRP: 3.9 mg/dL — ABNORMAL HIGH (ref <1.0)

## 2019-12-22 LAB — LACTIC ACID, PLASMA: Lactic Acid, Venous: 0.7 mmol/L (ref 0.5–1.9)

## 2019-12-22 MED ORDER — SODIUM CHLORIDE 0.9% FLUSH
10.0000 mL | Freq: Two times a day (BID) | INTRAVENOUS | Status: DC
  Administered 2019-12-22 – 2019-12-29 (×3): 10 mL

## 2019-12-22 MED ORDER — HYDROMORPHONE HCL 1 MG/ML IJ SOLN
0.5000 mg | Freq: Once | INTRAMUSCULAR | Status: AC
  Administered 2019-12-22: 0.5 mg via INTRAVENOUS
  Filled 2019-12-22: qty 1

## 2019-12-22 MED ORDER — DULOXETINE HCL 60 MG PO CPEP
60.0000 mg | ORAL_CAPSULE | Freq: Every day | ORAL | Status: DC
  Administered 2019-12-22 – 2019-12-29 (×8): 60 mg via ORAL
  Filled 2019-12-22 (×8): qty 1

## 2019-12-22 MED ORDER — HYDROXYZINE HCL 25 MG PO TABS
25.0000 mg | ORAL_TABLET | Freq: Three times a day (TID) | ORAL | Status: DC | PRN
  Administered 2019-12-22: 25 mg via ORAL
  Filled 2019-12-22: qty 1

## 2019-12-22 MED ORDER — LACTATED RINGERS IV SOLN: INTRAVENOUS | Status: AC

## 2019-12-22 MED ORDER — PROSIGHT PO TABS
1.0000 | ORAL_TABLET | Freq: Every day | ORAL | Status: DC
  Administered 2019-12-23 – 2019-12-29 (×7): 1 via ORAL
  Filled 2019-12-22 (×7): qty 1

## 2019-12-22 MED ORDER — MORPHINE SULFATE (PF) 2 MG/ML IV SOLN
2.0000 mg | INTRAVENOUS | Status: DC | PRN
  Administered 2019-12-22 – 2019-12-29 (×8): 2 mg via INTRAVENOUS
  Filled 2019-12-22 (×10): qty 1

## 2019-12-22 MED ORDER — MORPHINE SULFATE (PF) 2 MG/ML IV SOLN
2.0000 mg | Freq: Once | INTRAVENOUS | Status: AC
  Administered 2019-12-22: 2 mg via INTRAVENOUS

## 2019-12-22 MED ORDER — ENOXAPARIN SODIUM 40 MG/0.4ML ~~LOC~~ SOLN
40.0000 mg | SUBCUTANEOUS | Status: DC
  Administered 2019-12-23 – 2019-12-28 (×5): 40 mg via SUBCUTANEOUS
  Filled 2019-12-22 (×6): qty 0.4

## 2019-12-22 MED ORDER — ENSURE MAX PROTEIN PO LIQD
11.0000 [oz_av] | Freq: Every day | ORAL | Status: DC
  Administered 2019-12-22 – 2019-12-29 (×4): 11 [oz_av] via ORAL
  Filled 2019-12-22 (×8): qty 330

## 2019-12-22 MED ORDER — SODIUM CHLORIDE 0.9% FLUSH: 10.0000 mL | INTRAVENOUS | Status: DC | PRN

## 2019-12-22 MED ORDER — HYDROMORPHONE HCL 1 MG/ML IJ SOLN
1.0000 mg | Freq: Once | INTRAMUSCULAR | Status: AC
  Administered 2019-12-22: 1 mg via INTRAVENOUS
  Filled 2019-12-22: qty 1

## 2019-12-22 MED ORDER — PRO-STAT SUGAR FREE PO LIQD
30.0000 mL | Freq: Three times a day (TID) | ORAL | Status: DC
  Administered 2019-12-24 – 2019-12-29 (×4): 30 mL via ORAL
  Filled 2019-12-22 (×10): qty 30

## 2019-12-22 MED ORDER — GABAPENTIN 300 MG PO CAPS
600.0000 mg | ORAL_CAPSULE | Freq: Three times a day (TID) | ORAL | Status: DC
  Administered 2019-12-22 – 2019-12-29 (×21): 600 mg via ORAL
  Filled 2019-12-22 (×22): qty 2

## 2019-12-22 MED ORDER — SODIUM CHLORIDE 0.9 % IV SOLN
2.0000 g | INTRAVENOUS | Status: DC
  Administered 2019-12-22 – 2019-12-24 (×3): 2 g via INTRAVENOUS
  Filled 2019-12-22 (×2): qty 2
  Filled 2019-12-22: qty 20

## 2019-12-22 MED ORDER — ALBUTEROL SULFATE (2.5 MG/3ML) 0.083% IN NEBU
INHALATION_SOLUTION | RESPIRATORY_TRACT | Status: AC
  Administered 2019-12-22: 2.5 mg
  Filled 2019-12-22: qty 3

## 2019-12-22 MED ORDER — JUVEN PO PACK
1.0000 | PACK | Freq: Two times a day (BID) | ORAL | Status: DC
  Administered 2019-12-23 – 2019-12-29 (×5): 1 via ORAL
  Filled 2019-12-22 (×14): qty 1

## 2019-12-22 MED ORDER — MORPHINE SULFATE (PF) 4 MG/ML IV SOLN
4.0000 mg | INTRAVENOUS | Status: DC | PRN
  Administered 2019-12-23 – 2019-12-28 (×4): 4 mg via INTRAVENOUS
  Filled 2019-12-22 (×4): qty 1

## 2019-12-22 MED ORDER — VANCOMYCIN HCL 1500 MG/300ML IV SOLN
1500.0000 mg | Freq: Once | INTRAVENOUS | Status: AC
  Administered 2019-12-22: 1500 mg via INTRAVENOUS
  Filled 2019-12-22: qty 300

## 2019-12-22 MED ORDER — METOPROLOL TARTRATE 25 MG PO TABS
12.5000 mg | ORAL_TABLET | Freq: Two times a day (BID) | ORAL | Status: DC
  Administered 2019-12-22 – 2019-12-29 (×14): 12.5 mg via ORAL
  Filled 2019-12-22 (×15): qty 1

## 2019-12-22 MED ORDER — CEFAZOLIN SODIUM-DEXTROSE 1-4 GM/50ML-% IV SOLN
1.0000 g | Freq: Once | INTRAVENOUS | Status: AC
  Administered 2019-12-22: 1 g via INTRAVENOUS
  Filled 2019-12-22: qty 50

## 2019-12-22 MED ORDER — ALBUTEROL SULFATE (2.5 MG/3ML) 0.083% IN NEBU: 2.5000 mg | INHALATION_SOLUTION | Freq: Four times a day (QID) | RESPIRATORY_TRACT | Status: DC | PRN

## 2019-12-22 MED ORDER — INSULIN ASPART 100 UNIT/ML ~~LOC~~ SOLN
0.0000 [IU] | Freq: Three times a day (TID) | SUBCUTANEOUS | Status: DC
  Administered 2019-12-24 – 2019-12-25 (×2): 2 [IU] via SUBCUTANEOUS
  Filled 2019-12-22: qty 0.15

## 2019-12-22 MED ORDER — VANCOMYCIN HCL 750 MG/150ML IV SOLN
750.0000 mg | INTRAVENOUS | Status: DC
  Administered 2019-12-23 – 2019-12-24 (×2): 750 mg via INTRAVENOUS
  Filled 2019-12-22 (×2): qty 150

## 2019-12-22 NOTE — Progress Notes (Signed)
Pharmacy Antibiotic Note  [name] is a 69 y.o. female admitted on 12/21/2019 with increased swelling and redness of right LE ulcers.  Pharmacy has been consulted for vancomycin dosing.  Plan: Vancomycin 60156 mg iv once followed by vancomycin 750 mg IV Q 24 hrs. Goal AUC 400-550. Expected AUC: 497 SCr used: 1.25  - F/U renal function, culture results, and plans for antibiotics    Height: 5\' 6"  (167.6 cm) Weight: 80.7 kg (178 lb) IBW/kg (Calculated) : 59.3  Temp (24hrs), Avg:98.7 F (37.1 C), Min:98.7 F (37.1 C), Max:98.7 F (37.1 C)  Recent Labs  Lab 12/21/19 2324 12/21/19 2330  WBC  --  4.8  CREATININE  --  1.25*  LATICACIDVEN 0.7  --     Estimated Creatinine Clearance: 45.5 mL/min (A) (by C-G formula based on SCr of 1.25 mg/dL (H)).    Allergies  Allergen Reactions  . Codeine Other (See Comments)    "Gets high"  . Tylenol [Acetaminophen] Hives    Thank you for allowing pharmacy to be a part of this patient's care.  12/23/19 D 12/22/2019 5:45 AM

## 2019-12-22 NOTE — ED Notes (Signed)
unsuccessful restart Iv attempts x2.

## 2019-12-22 NOTE — ED Notes (Signed)
Pt transported to xray 

## 2019-12-22 NOTE — Progress Notes (Signed)
Initial Nutrition Assessment  DOCUMENTATION CODES:   Not applicable  INTERVENTION:  -Ensure Max po daily, each supplement provides 150 kcal and 30 grams of protein -Prostat 30 ml po TID, each supplement provides 100 kcal and 15 grams of protein -1 packet Juven BID, each packet provides 95 calories, 2.5 grams of protein (collagen), and 9.8 grams of carbohydrate (3 grams sugar); also contains 7 grams of L-arginine and L-glutamine, 300 mg vitamin C, 15 mg vitamin E, 1.2 mcg vitamin B-12, 9.5 mg zinc, 200 mg calcium, and 1.5 g  Calcium Beta-hydroxy-Beta-methylbutyrate to support wound healing -Ocuvite po daily, each MVI provides 200 mg vit C, 40 mg zinc, 55 mcg selenium, 2 mg copper, 2 mg lutein to promote wound healing   NUTRITION DIAGNOSIS:   Increased nutrient needs related to wound healing(bilateral cellulitis of lower leg) as evidenced by estimated needs.    GOAL:   Patient will meet greater than or equal to 90% of their needs    MONITOR:   Labs, I & O's, Skin, Supplement acceptance, Weight trends, PO intake  REASON FOR ASSESSMENT:   Consult Wound healing  ASSESSMENT:  RD working remotely.   69 year old female with past medical history of extensive venous stasis ulcers of BLE, lymphedema, DM2, atrial fibrillation/flutter, bipolar disorder, and anemia presents with complaints of progressively worsening pain in bilateral extremities, right greater than left with increased redness and swelling, as well as increased foul smell from lower extremity wounds. X-ray of right tibia fibula revealed periosteal new bone formation along anterior surface compatible with possible acute on chronic osteomyelitis and admitted for bilateral cellulitis of lower leg.  RD attempted to reach pt via phone this afternoon, however pt did not pick up and unable to obtain nutrition history at this time. She is on a HH/CM diet, no documented meals for review. Will continue to monitor for po intake of meals  and will provide Ensure and Prostat to aid with needs, as well as Juven and MVI to support wound healing.  Deep pitting BLE edema per RN assessment. Current wt 177.54 lbs Weight history reviewed, stable over the past year.  Per notes: -vascular test reveal pt safe to compress, but pt cannot tolerate d/t pain -Dr. Lajoyce Corners consulted -blood and wound cultures ordered  Medications reviewed and include: Gabapentin, SSI IVF: Lactated ringers IVPB: Rocephin, Vancomycin Labs: CBG 87 Lab Results  Component Value Date   HGBA1C 6.1 (H) 12/22/2019     NUTRITION - FOCUSED PHYSICAL EXAM: Unable to complete at this time, RD working remotely.  Diet Order:   Diet Order            Diet heart healthy/carb modified Room service appropriate? Yes; Fluid consistency: Thin  Diet effective now              EDUCATION NEEDS:   No education needs have been identified at this time  Skin:  Skin Assessment: Skin Integrity Issues: Skin Integrity Issues:: Stage II, Diabetic Ulcer Stage II: buttocks (05/14/19) Diabetic Ulcer: BLE  Last BM:  4/27 type 3  Height:   Ht Readings from Last 1 Encounters:  12/21/19 5\' 6"  (1.676 m)    Weight:   Wt Readings from Last 1 Encounters:  12/21/19 80.7 kg    Ideal Body Weight:  59.1 kg  BMI:  Body mass index is 28.73 kg/m.  Estimated Nutritional Needs:   Kcal:  12/23/19  Protein:  105-120  Fluid:  >/= 1.9 L/day   [phone], RD, LDN Clinical  Nutrition After Hours/Weekend Pager # in [place]

## 2019-12-22 NOTE — ED Provider Notes (Signed)
[facility] DEPT Provider Note   CSN: [id number] Arrival date & time: 12/21/19  2155     History No chief complaint on file.   [name] is a 69 y.o. female.  Patient with a history of arthritis, chronic LE venous stasis ulcerations, presents with onset pain associated with the right LE ulcers, increased swelling and redness, and malodor that started over the last several days. No fever. She reports she does not usually have pain with her chronic ulcers. She is followed by the [facility].   The history is provided by the patient. No language interpreter was used.       Past Medical History:  Diagnosis Date  . Arthritis   . Hypoglycemia   . Pneumonia, pneumococcal [facility])     Patient Active Problem List   Diagnosis Date Noted  . Severe protein-calorie malnutrition ([place])   . Venous stasis ulcers of both lower extremities ([place])   . Lower extremity cellulitis 08/12/2019  . Depression, major, recurrent, mild ([place]) 06/21/2019  . Pressure injury of skin 06/19/2019  . Cellulitis of left lower extremity   . Elevated C-reactive protein (CRP)   . Cellulitis of right lower extremity 06/18/2019  . Leg ulcer ([place])   . Adjustment disorder with mixed disturbance of emotions and conduct   . Type 2 diabetes mellitus with hyperglycemia ([place]) 05/04/2019  . AKI (acute kidney injury) ([place])   . Infected ulcer of skin ([place])   . High risk social situation   . Post-polio syndrome   . Chronic pain syndrome   . Limited mobility 04/20/2019  . Venous stasis ulcer ([place]) 03/31/2019  . Overgrown toenails 10/28/2018  . Wound of right leg 02/06/2018  . Atrial fibrillation ([place]) 01/03/2018  . Anemia 01/03/2018  . Elevated serum creatinine 01/03/2018  . Bipolar I disorder with mania ([place]) 01/03/2018    Past Surgical History:  Procedure Laterality Date  . ABDOMINAL HYSTERECTOMY    . CESAREAN SECTION       OB History    Gravida      Para      Term      Preterm      AB      Living  3     SAB      TAB      Ectopic      Multiple      Live Births              No family history on file.  Social History   Tobacco Use  . Smoking status: Heavy Tobacco Smoker    Packs/day: 0.50  . Smokeless tobacco: Never Used  Substance Use Topics  . Alcohol use: No  . Drug use: No    Home Medications Prior to Admission medications   Medication Sig Start Date End Date Taking? Authorizing Provider  DULoxetine HCl 40 MG CPEP Take 40 mg by mouth daily. 06/29/19   Guadalupe Dawn, MD  gabapentin (NEURONTIN) 300 MG capsule Take 2 capsules (600 mg total) by mouth 3 (three) times daily. 06/29/19 07/29/19  Guadalupe Dawn, MD  Hydrocortisone (GERHARDT'S BUTT CREAM) CREA Apply 1 application topically 2 (two) times daily. 06/28/19   Wilber Oliphant, MD  hydrOXYzine (ATARAX/VISTARIL) 25 MG tablet Take 1 tablet (25 mg total) by mouth every 8 (eight) hours as needed for itching. 11/09/19   Milton Ferguson, MD  metoprolol tartrate (LOPRESSOR) 25 MG tablet Take 0.5 tablets (12.5 mg total) by mouth 2 (two) times  daily. 06/29/19 08/12/19  Myrene Buddy, MD  naproxen sodium (ALEVE) 220 MG tablet Take 220 mg by mouth daily as needed (pain).    [provider]  furosemide (LASIX) 20 MG tablet Take 0.5 tablets (10 mg total) by mouth daily. Patient not taking: Reported on 04/18/2018 12/10/17 05/04/19  Arthor Captain, PA-C    Allergies    Codeine and Tylenol [acetaminophen]  Review of Systems   Review of Systems  Constitutional: Negative for fever.  Respiratory: Negative for shortness of breath.   Cardiovascular: Negative for chest pain.  Gastrointestinal: Negative for abdominal pain and nausea.  Musculoskeletal:       See HPI.  Skin: Positive for color change and wound.  Neurological: Negative for weakness and numbness.    Physical Exam Updated Vital Signs BP 132/84   Pulse 98   Temp 98.7 F (37.1 C) (Oral)   Resp 16   Ht 5\' 6"  (1.676 m)    Wt 80.7 kg   SpO2 94%   BMI 28.73 kg/m   Physical Exam Vitals and nursing note reviewed.  Constitutional:      Appearance: She is well-developed.  Pulmonary:     Effort: Pulmonary effort is normal.  Musculoskeletal:     Cervical back: Normal range of motion.     Comments: Lower legs bandaged. Bandage removed from right leg. There are deep ulcerations present. Significant malodor. Posterior ulceration has a collection of purulent drainage. No bleeding. Small new ulcer at posterior heel.   Skin:    General: Skin is warm and dry.     Findings: Erythema present.  Neurological:     Mental Status: She is alert and oriented to person, place, and time.     ED Results / Procedures / Treatments   Labs (all labs ordered are listed, but only abnormal results are displayed) Labs Reviewed  COMPREHENSIVE METABOLIC PANEL - Abnormal; Notable for the following components:      Result Value   Glucose, Bld 110 (*)    BUN 24 (*)    Creatinine, Ser 1.25 (*)    Calcium 8.8 (*)    Albumin 3.0 (*)    AST 14 (*)    GFR calc non Af Amer 44 (*)    GFR calc Af Amer 51 (*)    All other components within normal limits  CBC WITH DIFFERENTIAL/PLATELET - Abnormal; Notable for the following components:   RBC 3.45 (*)    Hemoglobin 8.5 (*)    HCT 29.2 (*)    MCH 24.6 (*)    MCHC 29.1 (*)    RDW 17.0 (*)    All other components within normal limits  LACTIC ACID, PLASMA  LACTIC ACID, PLASMA   Results for orders placed or performed during the hospital encounter of 12/21/19  Lactic acid, plasma  Result Value Ref Range   Lactic Acid, Venous 0.7 0.5 - 1.9 mmol/L  Comprehensive metabolic panel  Result Value Ref Range   Sodium 138 135 - 145 mmol/L   Potassium 4.1 3.5 - 5.1 mmol/L   Chloride 103 98 - 111 mmol/L   CO2 27 22 - 32 mmol/L   Glucose, Bld 110 (H) 70 - 99 mg/dL   BUN 24 (H) 8 - 23 mg/dL   Creatinine, Ser 12/23/19 (H) 0.44 - 1.00 mg/dL   Calcium 8.8 (L) 8.9 - 10.3 mg/dL   Total Protein 7.5  6.5 - 8.1 g/dL   Albumin 3.0 (L) 3.5 - 5.0 g/dL   AST 14 (L)  15 - 41 U/L   ALT 11 0 - 44 U/L   Alkaline Phosphatase 88 38 - 126 U/L   Total Bilirubin 0.5 0.3 - 1.2 mg/dL   GFR calc non Af Amer 44 (L) >60 mL/min   GFR calc Af Amer 51 (L) >60 mL/min   Anion gap 8 5 - 15  CBC with Differential  Result Value Ref Range   WBC 4.8 4.0 - 10.5 K/uL   RBC 3.45 (L) 3.87 - 5.11 MIL/uL   Hemoglobin 8.5 (L) 12.0 - 15.0 g/dL   HCT 42.3 (L) 53.6 - 14.4 %   MCV 84.6 80.0 - 100.0 fL   MCH 24.6 (L) 26.0 - 34.0 pg   MCHC 29.1 (L) 30.0 - 36.0 g/dL   RDW 31.5 (H) 40.0 - 86.7 %   Platelets 231 150 - 400 K/uL   nRBC 0.0 0.0 - 0.2 %   Neutrophils Relative % 58 %   Neutro Abs 2.8 1.7 - 7.7 K/uL   Lymphocytes Relative 25 %   Lymphs Abs 1.2 0.7 - 4.0 K/uL   Monocytes Relative 10 %   Monocytes Absolute 0.5 0.1 - 1.0 K/uL   Eosinophils Relative 6 %   Eosinophils Absolute 0.3 0.0 - 0.5 K/uL   Basophils Relative 1 %   Basophils Absolute 0.1 0.0 - 0.1 K/uL   Immature Granulocytes 0 %   Abs Immature Granulocytes 0.01 0.00 - 0.07 K/uL    EKG None  Radiology No results found.  Procedures Procedures (including critical care time)  Medications Ordered in ED Medications  sodium chloride flush (NS) 0.9 % injection 3 mL (has no administration in time range)  HYDROmorphone (DILAUDID) injection 0.5 mg (has no administration in time range)    ED Course  I have reviewed the triage vital signs and the nursing notes.  Pertinent labs & imaging results that were available during my care of the patient were reviewed by me and considered in my medical decision making (see chart for details).    MDM Rules/Calculators/A&P                      Patient to ED with new pain in right leg associated with chronic ulcerations, increased erythema and swelling and new foul odor.   Exam is c/w cellulitis/soft tissue infection of the right LE. Imaging shows ?acute on chronic osteomyelitis of fibula. IV Ancef started.  Plan to admit to medicine.   Pain medication provided. The patient is a Designer, fashion/clothing patient, however, there are no available beds at [facility] so she will be admitted at [facility]. Discussed with Dr. Leafy Half who accepts the patient to his service.  Final Clinical Impression(s) / ED Diagnoses Final diagnoses:  None   1. Lower extremity cellulitis 2. Chronic venous stasis ulcerations   Rx / DC Orders ED Discharge Orders    None       Danne Harbor 12/22/19 0406    Dione Booze, MD 12/22/19 [phone]

## 2019-12-22 NOTE — Progress Notes (Signed)
Patient refused MRSA swab. Educated patient.

## 2019-12-22 NOTE — ED Notes (Signed)
ED TO INPATIENT HANDOFF REPORT  ED Nurse Name and Phone #: [phone]  S Name/Age/Gender [name] 69 y.o. female Room/Bed: WA21/WA21  Code Status   Code Status: Prior  Home/SNF/Other Home Patient oriented to: self, place and time Is this baseline? Yes   Triage Complete: Triage complete  Chief Complaint Bilateral cellulitis of lower leg [L03.116, L03.115]  Triage Note BIB EMS from [facility]. PT has hx of ulcer bilaterally in lower legs. PT stared experiencing pain in right lower leg today.  Right leg has black/grey tissue at top of wound and foul smell.  Pt denies hx of diabetes.      Allergies Allergies  Allergen Reactions  . Codeine Other (See Comments)    "Gets high"  . Tylenol [Acetaminophen] Hives    Level of Care/Admitting Diagnosis ED Disposition    ED Disposition Condition Comment   Admit  [facility]ospital Area: [facility] [[id number]]  Level of Care: Med-Surg [16]  May admit patient to [facility] or [facility] if equivalent level of care is available:: Yes  Covid Evaluation: Asymptomatic Screening Protocol (No Symptoms)  Diagnosis: Bilateral cellulitis of lower leg [721199]  Admitting Physician: Marinda Elk [[id number]]  Attending Physician: Marinda Elk [[id number]]  Estimated length of stay: 5 - 7 days  Certification:: I certify this patient will need inpatient services for at least 2 midnights       B Medical/Surgery History Past Medical History:  Diagnosis Date  . Arthritis   . Hypoglycemia   . Pneumonia, pneumococcal [facility])    Past Surgical History:  Procedure Laterality Date  . ABDOMINAL HYSTERECTOMY    . CESAREAN SECTION       A IV Location/Drains/Wounds Patient Lines/Drains/Airways Status   Active Line/Drains/Airways    Name:   Placement date:   Placement time:   Site:   Days:   Peripheral IV 12/22/19 Left;Anterior Forearm   12/22/19    0702    Forearm   less than 1   External Urinary Catheter   08/12/19     2222    --   132   Pressure Injury 05/14/19 Buttocks Right Stage II -  Partial thickness loss of dermis presenting as a shallow open ulcer with a red, pink wound bed without slough.   05/14/19    2300     222   Pressure Injury 05/14/19 Buttocks Right Stage II -  Partial thickness loss of dermis presenting as a shallow open ulcer with a red, pink wound bed without slough.   05/14/19    2300     222   Pressure Injury 06/18/19 Buttocks   06/18/19    2230     187   Pressure Injury 06/18/19 Rectum Medial;Right;Left Stage II -  Partial thickness loss of dermis presenting as a shallow open ulcer with a red, pink wound bed without slough. skin peeling off, pink, purple in color   06/18/19    2230     187   Pressure Injury 08/12/19 Heel Right Unstageable - Full thickness tissue loss in which the base of the ulcer is covered by slough (yellow, tan, gray, green or brown) and/or eschar (tan, brown or black) in the wound bed.   08/12/19    2330     132   Pressure Injury 08/12/19 Heel Left Unstageable - Full thickness tissue loss in which the base of the injury is covered by slough (yellow, tan, gray, green or brown) and/or eschar (tan, brown  or black) in the wound bed.   08/12/19    --     132   Wound / Incision (Open or Dehisced) 04/18/18 Other (Comment) Leg Right;Lower partial thickness, wraps around entire circumfrence of calf. Wound is yellow, gray, pink borders, oozing wtih odor.   04/18/18    1215    Leg   613   Wound / Incision (Open or Dehisced) 05/04/19 Venous stasis ulcer Pretibial Left;Right;Lower venous stasis ulcer   05/04/19    1900    Pretibial   232   Wound / Incision (Open or Dehisced) 06/18/19 Venous stasis ulcer Pretibial Right;Lateral chronic non healing ulceration   06/18/19    1318    Pretibial   187   Wound / Incision (Open or Dehisced) 06/18/19 Venous stasis ulcer Pretibial Left;Medial chronic non healing ulceration   06/18/19    1318    Pretibial   187          Intake/Output Last 24  hours  Intake/Output Summary (Last 24 hours) at 12/22/2019 1058 Last data filed at 12/22/2019 0235 Gross per 24 hour  Intake 50 ml  Output --  Net 50 ml    Labs/Imaging Results for orders placed or performed during the hospital encounter of 12/21/19 (from the past 48 hour(s))  Lactic acid, plasma     Status: None   Collection Time: 12/21/19 11:24 PM  Result Value Ref Range   Lactic Acid, Venous 0.7 0.5 - 1.9 mmol/L    Comment: Performed at [facility], [address] [place], [place] [address]  Comprehensive metabolic panel     Status: Abnormal   Collection Time: 12/21/19 11:30 PM  Result Value Ref Range   Sodium 138 135 - 145 mmol/L   Potassium 4.1 3.5 - 5.1 mmol/L   Chloride 103 98 - 111 mmol/L   CO2 27 22 - 32 mmol/L   Glucose, Bld 110 (H) 70 - 99 mg/dL    Comment: Glucose reference range applies only to samples taken after fasting for at least 8 hours.   BUN 24 (H) 8 - 23 mg/dL   Creatinine, Ser 0.62 (H) 0.44 - 1.00 mg/dL   Calcium 8.8 (L) 8.9 - 10.3 mg/dL   Total Protein 7.5 6.5 - 8.1 g/dL   Albumin 3.0 (L) 3.5 - 5.0 g/dL   AST 14 (L) 15 - 41 U/L   ALT 11 0 - 44 U/L   Alkaline Phosphatase 88 38 - 126 U/L   Total Bilirubin 0.5 0.3 - 1.2 mg/dL   GFR calc non Af Amer 44 (L) >60 mL/min   GFR calc Af Amer 51 (L) >60 mL/min   Anion gap 8 5 - 15    Comment: Performed at [facility], [address], [place], [place] [address]  CBC with Differential     Status: Abnormal   Collection Time: 12/21/19 11:30 PM  Result Value Ref Range   WBC 4.8 4.0 - 10.5 K/uL   RBC 3.45 (L) 3.87 - 5.11 MIL/uL   Hemoglobin 8.5 (L) 12.0 - 15.0 g/dL   HCT 46.2 (L) 70.3 - 50.0 %   MCV 84.6 80.0 - 100.0 fL   MCH 24.6 (L) 26.0 - 34.0 pg   MCHC 29.1 (L) 30.0 - 36.0 g/dL   RDW 93.8 (H) 18.2 - 99.3 %   Platelets 231 150 - 400 K/uL   nRBC 0.0 0.0 - 0.2 %   Neutrophils Relative % 58 %   Neutro Abs  2.8 1.7 - 7.7 K/uL   Lymphocytes Relative 25 %   Lymphs Abs  1.2 0.7 - 4.0 K/uL   Monocytes Relative 10 %   Monocytes Absolute 0.5 0.1 - 1.0 K/uL   Eosinophils Relative 6 %   Eosinophils Absolute 0.3 0.0 - 0.5 K/uL   Basophils Relative 1 %   Basophils Absolute 0.1 0.0 - 0.1 K/uL   Immature Granulocytes 0 %   Abs Immature Granulocytes 0.01 0.00 - 0.07 K/uL    Comment: Performed at [facility], [place] [address] [place], [place] [address]  Respiratory Panel by RT PCR (Flu A&B, Covid) - Nasopharyngeal Swab     Status: None   Collection Time: 12/22/19  6:49 AM   Specimen: Nasopharyngeal Swab  Result Value Ref Range   SARS Coronavirus 2 by RT PCR NEGATIVE NEGATIVE    Comment: (NOTE) SARS-CoV-2 target nucleic acids are NOT DETECTED. The SARS-CoV-2 RNA is generally detectable in upper respiratoy specimens during the acute phase of infection. The lowest concentration of SARS-CoV-2 viral copies this assay can detect is 131 copies/mL. A negative result does not preclude SARS-Cov-2 infection and should not be used as the sole basis for treatment or other patient management decisions. A negative result may occur with  improper specimen collection/handling, submission of specimen other than nasopharyngeal swab, presence of viral mutation(s) within the areas targeted by this assay, and inadequate number of viral copies (<131 copies/mL). A negative result must be combined with clinical observations, patient history, and epidemiological information. The expected result is Negative. Fact Sheet for Patients:  PinkCheek.be Fact Sheet for Healthcare Providers:  GravelBags.it This test is not yet ap proved or cleared by the [place] FDA and  has been authorized for detection and/or diagnosis of SARS-CoV-2 by FDA under an Emergency Use Authorization (EUA). This EUA will remain  in effect (meaning this test can be used) for the duration of the COVID-19 declaration under Section  564(b)(1) of the Act, 21 U.S.C. section 360bbb-3(b)(1), unless the authorization is terminated or revoked sooner.    Influenza A by PCR NEGATIVE NEGATIVE   Influenza B by PCR NEGATIVE NEGATIVE    Comment: (NOTE) The Xpert Xpress SARS-CoV-2/FLU/RSV assay is intended as an aid in  the diagnosis of influenza from Nasopharyngeal swab specimens and  should not be used as a sole basis for treatment. Nasal washings and  aspirates are unacceptable for Xpert Xpress SARS-CoV-2/FLU/RSV  testing. Fact Sheet for Patients: PinkCheek.be Fact Sheet for Healthcare Providers: GravelBags.it This test is not yet approved or cleared by the [place] FDA and  has been authorized for detection and/or diagnosis of SARS-CoV-2 by  FDA under an Emergency Use Authorization (EUA). This EUA will remain  in effect (meaning this test can be used) for the duration of the  Covid-19 declaration under Section 564(b)(1) of the Act, 21  U.S.C. section 360bbb-3(b)(1), unless the authorization is  terminated or revoked. Performed at [facility], [address], [place], [place] [address]   CBG monitoring, ED     Status: None   Collection Time: 12/22/19  8:34 AM  Result Value Ref Range   Glucose-Capillary 87 70 - 99 mg/dL    Comment: Glucose reference range applies only to samples taken after fasting for at least 8 hours.   DG Tibia/Fibula Left  Result Date: 12/22/2019 CLINICAL DATA:  Severe pain with open wounds on the lower extremity EXAM: LEFT TIBIA AND FIBULA - 2 VIEW COMPARISON:  08/12/2019 FINDINGS: Generalized  soft tissue swelling and subcutaneous reticulation. The patient's wounds are likely seen as excavation along the lateral leg. There is no underlying fracture or erosion. No opaque foreign body or soft tissue emphysema. Osteopenia and osteoarthritis of the knee. IMPRESSION: Soft tissue swelling without acute osseous finding, opaque  foreign body, or soft tissue emphysema. Electronically Signed   By: Marnee Spring M.D.   On: 12/22/2019 06:47   DG Tibia/Fibula Right  Result Date: 12/22/2019 CLINICAL DATA:  Anterior leg wound EXAM: RIGHT TIBIA AND FIBULA - 2 VIEW COMPARISON:  08/12/2019 FINDINGS: There is periosteal new bone formation along the anterolateral surface of the fibula. No osseous abnormality of the tibia. No fracture or dislocation. Large soft tissue wound anteriorly. IMPRESSION: Periosteal new bone formation along the anterolateral surface of the right fibula, compatible with chronic or acute-on-chronic osteomyelitis. Electronically Signed   By: Deatra Robinson M.D.   On: 12/22/2019 02:07   DG Foot 2 Views Left  Result Date: 12/22/2019 CLINICAL DATA:  Severe pain with open wounds the lower extremity EXAM: LEFT FOOT - 2 VIEW COMPARISON:  None. FINDINGS: Osteopenia.  No fracture, dislocation malalignment, or erosion. Skin thickening and excavations to the medial and lateral lower leg. No soft tissue emphysema or opaque foreign body. Hammertoe deformities on the lateral view.  Hallux valgus. IMPRESSION: Soft tissue swelling without acute osseous finding. Electronically Signed   By: Marnee Spring M.D.   On: 12/22/2019 06:48   DG Foot Complete Right  Result Date: 12/22/2019 CLINICAL DATA:  Soft tissue infection EXAM: RIGHT FOOT COMPLETE - 3+ VIEW COMPARISON:  None. FINDINGS: There is no evidence of fracture or dislocation. No osteolysis of the bones of the foot. There is metatarsus primus varus and hallux valgus. IMPRESSION: No radiographic evidence of osteomyelitis of the foot. Electronically Signed   By: Deatra Robinson M.D.   On: 12/22/2019 02:08    Pending Labs Unresulted Labs (From admission, onward)    Start     Ordered   12/22/19 0532  Hemoglobin A1c  Once,   STAT    Comments: To assess prior glycemic control    12/22/19 0531   12/22/19 0329  Culture, blood (routine x 2)  BLOOD CULTURE X 2,   STAT    Question:   Patient immune status  Answer:  Normal   12/22/19 0328   12/22/19 0329  Aerobic/Anaerobic Culture (surgical/deep wound)  Once,   STAT    Question:  Patient immune status  Answer:  Normal   12/22/19 0328   12/21/19 2324  Lactic acid, plasma  Now then every 2 hours,   STAT     12/21/19 2324   Signed and Held  HIV Antibody (routine testing w rflx)  (HIV Antibody (Routine testing w reflex) panel)  Once,   R     Signed and Held   Signed and Held  Sedimentation rate  Add-on,   R     Signed and Held   Signed and Held  C-reactive protein  Add-on,   R     Signed and Held   Signed and Held  Prealbumin  Add-on,   R     Signed and Held   Signed and Held  MRSA PCR Screening  Once,   R     Signed and Held   Signed and Held  Comprehensive metabolic panel  Tomorrow morning,   R     Signed and Held   Signed and Held  CBC  Tomorrow morning,  R     Signed and Held   Signed and Held  Protime-INR  Tomorrow morning,   R     Signed and Held   Signed and Held  APTT  Tomorrow morning,   R     Signed and Held          Vitals/Pain Today's Vitals   12/22/19 0830 12/22/19 0900 12/22/19 0930 12/22/19 1000  BP: (!) 118/54 (!) 112/49 (!) 117/54 (!) 103/47  Pulse: 83 82 79 83  Resp: 14 15 13 12   Temp:      TempSrc:      SpO2: (!) 89% 90% 90% 93%  Weight:      Height:      PainSc:        Isolation Precautions No active isolations  Medications Medications  sodium chloride flush (NS) 0.9 % injection 3 mL (has no administration in time range)  insulin aspart (novoLOG) injection 0-15 Units (0 Units Subcutaneous Not Given 12/22/19 1039)  cefTRIAXone (ROCEPHIN) 2 g in sodium chloride 0.9 % 100 mL IVPB (0 g Intravenous Stopped 12/22/19 1054)  morphine 2 MG/ML injection 2 mg (2 mg Intravenous Given 12/22/19 1047)    Or  morphine 4 MG/ML injection 4 mg ( Intravenous See Alternative 12/22/19 1047)  vancomycin (VANCOREADY) IVPB 1500 mg/300 mL (1,500 mg Intravenous New Bag/Given 12/22/19 1039)  vancomycin  (VANCOREADY) IVPB 750 mg/150 mL (has no administration in time range)  HYDROmorphone (DILAUDID) injection 0.5 mg (0.5 mg Intravenous Given 12/22/19 0201)  ceFAZolin (ANCEF) IVPB 1 g/50 mL premix (0 g Intravenous Stopped 12/22/19 0235)  HYDROmorphone (DILAUDID) injection 1 mg (1 mg Intravenous Given 12/22/19 [id number])    Mobility non-ambulatory High fall risk   Focused Assessments SKIN   R Recommendations: See Admitting Provider Note  Report given to:   Additional Notes:

## 2019-12-22 NOTE — ED Notes (Signed)
Admitting MD at bedside.

## 2019-12-22 NOTE — Consult Note (Signed)
WOC Nurse Consult Note: Reason for Consult: Chronic osteomyelitis to right lower leg.  Dr Lajoyce Corners has been reconsulted to assess the limb.  Nonhealing chronic wounds to left posterior lower leg  Purulence noted  Wound type: Pressure Injury POA: Yes/No/NA Measurement: chronic nonhealing. Vascular tests reveal that patient is safe to compress but patient cannot tolerate compression due to pain.  Wound VGC:YOYOOJ 100% fibrin with purulence noted Drainage (amount, consistency, odor) moderate purulence to wounds  Musty odor Periwound:chronic skin changes to lower intact skin.  Generalized edema and pain Dressing procedure/placement/frequency: BEdside RN to perform:  Cleanse lower legs with soap and water and pat gently dry.  Apply Aquacel to open wounds on bilateral legs Wrap with kerlix and secure with coban. Change Tues/Thurs/Saturday Supplies to order:  Aquacel # [id number] Coban [id number] Will not follow at this time.  Please re-consult if needed.  [place] MSN, RN, FNP-BC CWON Wound, Ostomy, Continence Nurse Pager [phone]

## 2019-12-22 NOTE — Progress Notes (Signed)
69 year old female coming from home with acute on chronic bilateral lower extremity wounds.  She has extensive bilateral lower extremity wounds.  X-rays reveal right fibula chronic or acute on chronic osteomyelitis.  Patient started on broad-spectrum antibiotics Vanco and Rocephin. Wound care consulted Dr. Lajoyce Corners consulted Pictures of the right lower extremity in chart Blood cultures and wound cultures ordered

## 2019-12-22 NOTE — ED Notes (Signed)
IV team at bedside 

## 2019-12-22 NOTE — ED Notes (Signed)
Large open sores bilateral lower leg yellow exudate from wound grey dusky area above sores on right leg.

## 2019-12-22 NOTE — H&P (Signed)
History and Physical    [name] GYI:[id number] DOB: 27-Mar-1951 DOA: 12/21/2019  PCP: [name], DO  Patient coming from: Home   Chief Complaint:   Bilateral lower extremity pain   HPI:   69 year old female with past medical history of extensive venous stasis ulcers of the bilateral lower extremities, lymphedema, diabetes mellitus type 2, atrial fibrillation/flutter, bipolar disorder, anemia who presents to [facility] long emergency department with complaints of bilateral lower extremity pain.  Patient is an extremely poor historian when it comes to the details of her condition as well as the details of the HPI.  Patient explains that for the past 1 month she has been experiencing progressively worsening pain in the bilateral lower extremities, right greater than left.  Patient describes his pain as sharp in quality, severe in intensity, worse with weightbearing or movement of the bilateral lower extremities.  Over the same span of time patient is also experienced aggressively worsening redness and swelling of the bilateral lower extremities and increasing foul smell from the lower extremity wounds.  Patient denies fevers, sick contacts, generalized weakness, nausea, vomiting.  Patient denies any recent travel or confirmed contact with COVID-19.  Due to the patient's progressively worsening pain the patient eventually presented to [facility] long emergency department for evaluation.  Clinically, patient was felt to be suffering from recurrent bilateral lower extremity cellulitis.  X-ray of the right tibia fibula revealed periosteal new bone formation along the anterior surface of the right fibula compatible with possible acute on chronic osteomyelitis.  Patient was initiated on intravenous Ancef and the hospitalist group was then called to assess the patient for admission the hospital.    Review of Systems: A 10-system review of systems has been performed and all systems are negative  with the exception of what is listed in the HPI .   Past Medical History:  Diagnosis Date   Arthritis    Hypoglycemia    Pneumonia, pneumococcal (HCC)     Past Surgical History:  Procedure Laterality Date   ABDOMINAL HYSTERECTOMY     CESAREAN SECTION       reports that she has been smoking. She has been smoking about 0.25 packs per day. She has never used smokeless tobacco. She reports that she does not drink alcohol or use drugs.  Allergies  Allergen Reactions   Codeine Other (See Comments)    "Gets high"   Tylenol [Acetaminophen] Hives    Family History  Family history unknown: Yes     Prior to Admission medications   Medication Sig Start Date End Date Taking? Authorizing Provider  DULoxetine (CYMBALTA) 60 MG capsule Take 60 mg by mouth daily.   Yes [provider]  gabapentin (NEURONTIN) 300 MG capsule Take 2 capsules (600 mg total) by mouth 3 (three) times daily. 06/29/19 12/22/19 Yes Myrene Buddy, MD  hydrOXYzine (ATARAX/VISTARIL) 25 MG tablet Take 1 tablet (25 mg total) by mouth every 8 (eight) hours as needed for itching. 11/09/19  Yes Bethann Berkshire, MD  metoprolol tartrate (LOPRESSOR) 25 MG tablet Take 0.5 tablets (12.5 mg total) by mouth 2 (two) times daily. 06/29/19 12/22/19 Yes Myrene Buddy, MD  naproxen sodium (ALEVE) 220 MG tablet Take 220 mg by mouth daily as needed (pain).   Yes [provider]  traMADol (ULTRAM) 50 MG tablet Take by mouth every 12 (twelve) hours as needed for moderate pain.   Yes [provider]  DULoxetine HCl 40 MG CPEP Take 40 mg by mouth daily. Patient not  taking: Reported on 12/22/2019 06/29/19   Myrene Buddy, MD  Hydrocortisone (GERHARDT'S BUTT CREAM) CREA Apply 1 application topically 2 (two) times daily. Patient not taking: Reported on 12/22/2019 06/28/19   Melene Plan, MD  furosemide (LASIX) 20 MG tablet Take 0.5 tablets (10 mg total) by mouth daily. Patient not taking: Reported on 04/18/2018  12/10/17 05/04/19  Arthor Captain, PA-C    Physical Exam: Vitals:   12/22/19 0300 12/22/19 0400 12/22/19 0430 12/22/19 0500  BP: (!) 144/84 (!) 146/86 (!) 152/81 (!) 152/81  Pulse: 88 96 86 (!) 103  Resp: 16   18  Temp:      TempSrc:      SpO2: 92% 95% 94% 95%  Weight:      Height:        Constitutional: Acute alert and oriented x3, patient is in distress due to bilateral lower extremity pain. Skin: Extensive bilateral leg wounds that extend to the muscle.  Wounds are foul-smelling with scant amounts of serosanguineous drainage..  There is significant surrounding redness induration and warmth surrounding these numerous wounds. Eyes: Pupils are equally reactive to light.  No evidence of scleral icterus or conjunctival pallor.  ENMT: Mucous membranes are extremely dry.  Posterior pharynx clear of any exudate or lesions. Normal dentition.   Neck: normal, supple, no masses, no thyromegaly Respiratory: clear to auscultation bilaterally, no wheezing, no crackles. Normal respiratory effort. No accessory muscle use.  Cardiovascular: Regular rate and rhythm, no murmurs / rubs / gallops. No extremity edema. 2+ pedal pulses. No carotid bruits.  Back:   Nontender without crepitus or deformity. Abdomen: Abdomen is soft and nontender.  No evidence of intra-abdominal masses.  Positive bowel sounds noted in all quadrants.   Musculoskeletal: Exquisite tenderness of the bilateral lower extremities in the areas described in the skin examination.  Of the bilateral lower extremities.  Good range of motion, normal muscle tone.  Neurologic: CN 2-12 grossly intact. Sensation intact, strength noted to be 5 out of 5 in all 4 extremities.  Patient is following all commands.  Patient is responsive to verbal stimuli.   Psychiatric: Patient presents as a anxious mood with labile affect.  Patient seems to possess insight as to theircurrent situation.     Labs on Admission: I have personally reviewed following labs and  imaging studies -   CBC: Recent Labs  Lab 12/21/19 2330  WBC 4.8  NEUTROABS 2.8  HGB 8.5*  HCT 29.2*  MCV 84.6  PLT 231   Basic Metabolic Panel: Recent Labs  Lab 12/21/19 2330  NA 138  K 4.1  CL 103  CO2 27  GLUCOSE 110*  BUN 24*  CREATININE 1.25*  CALCIUM 8.8*   GFR: Estimated Creatinine Clearance: 45.5 mL/min (A) (by C-G formula based on SCr of 1.25 mg/dL (H)). Liver Function Tests: Recent Labs  Lab 12/21/19 2330  AST 14*  ALT 11  ALKPHOS 88  BILITOT 0.5  PROT 7.5  ALBUMIN 3.0*   No results for input(s): LIPASE, AMYLASE in the last 168 hours. No results for input(s): AMMONIA in the last 168 hours. Coagulation Profile: No results for input(s): INR, PROTIME in the last 168 hours. Cardiac Enzymes: No results for input(s): CKTOTAL, CKMB, CKMBINDEX, TROPONINI in the last 168 hours. BNP (last 3 results) No results for input(s): PROBNP in the last 8760 hours. HbA1C: No results for input(s): HGBA1C in the last 72 hours. CBG: No results for input(s): GLUCAP in the last 168 hours. Lipid Profile: No results for  input(s): CHOL, HDL, LDLCALC, TRIG, CHOLHDL, LDLDIRECT in the last 72 hours. Thyroid Function Tests: No results for input(s): TSH, T4TOTAL, FREET4, T3FREE, THYROIDAB in the last 72 hours. Anemia Panel: No results for input(s): VITAMINB12, FOLATE, FERRITIN, TIBC, IRON, RETICCTPCT in the last 72 hours. Urine analysis:    Component Value Date/Time   COLORURINE YELLOW 05/03/2019 2333   APPEARANCEUR TURBID (A) 05/03/2019 2333   LABSPEC 1.031 (H) 05/03/2019 2333   PHURINE 5.0 05/03/2019 2333   GLUCOSEU NEGATIVE 05/03/2019 2333   HGBUR NEGATIVE 05/03/2019 2333   BILIRUBINUR NEGATIVE 05/03/2019 2333   KETONESUR NEGATIVE 05/03/2019 2333   PROTEINUR 100 (A) 05/03/2019 2333   NITRITE POSITIVE (A) 05/03/2019 2333   LEUKOCYTESUR TRACE (A) 05/03/2019 2333    Radiological Exams on Admission personally reviewed: DG Tibia/Fibula Right  Result Date:  12/22/2019 CLINICAL DATA:  Anterior leg wound EXAM: RIGHT TIBIA AND FIBULA - 2 VIEW COMPARISON:  08/12/2019 FINDINGS: There is periosteal new bone formation along the anterolateral surface of the fibula. No osseous abnormality of the tibia. No fracture or dislocation. Large soft tissue wound anteriorly. IMPRESSION: Periosteal new bone formation along the anterolateral surface of the right fibula, compatible with chronic or acute-on-chronic osteomyelitis. Electronically Signed   By: Ulyses Jarred M.D.   On: 12/22/2019 02:07   DG Foot Complete Right  Result Date: 12/22/2019 CLINICAL DATA:  Soft tissue infection EXAM: RIGHT FOOT COMPLETE - 3+ VIEW COMPARISON:  None. FINDINGS: There is no evidence of fracture or dislocation. No osteolysis of the bones of the foot. There is metatarsus primus varus and hallux valgus. IMPRESSION: No radiographic evidence of osteomyelitis of the foot. Electronically Signed   By: Ulyses Jarred M.D.   On: 12/22/2019 02:08    Assessment/Plan Principal Problem:   Bilateral cellulitis of lower leg   Evidence of redness, induration, warmth and exquisite tenderness of the soft tissue surrounding the extensive wounds of the bilateral lower extremities, right worse than left.  Wounds themselves exhibit a foul smell with small amounts of drainage  Soft tissue appearance of bilateral lower extremity cellulitis which the patient has presented with multiple times in the past  Furthermore, right tib-fib x-ray revealing possible acute on chronic osteomyelitis of the right fibula.  I have placed the patient on intravenous ceftriaxone and vancomycin  Blood cultures obtained  Wound culture ordered  MRSA PCR screen ordered  Wound care consult ordered for assistance and dressing patient's extensive wounds  As needed intravenous opiate-based analgesics for substantial associated pain.  Patient was evaluated by Dr. Sharol Given with Orthopedics in 07/2019.  At that time, no surgical  intervention was recommended however now with the development of osteomyelitis will consider consulting him this morning.  Patient is extensive wounds of the bilateral lower extremities are thought to be secondary to venous hypertension and ulcer formation  Active Problems:   Osteomyelitis of right fibula [facility])   Please note assessment and plan notations above.    Atrial fibrillation ([place])   EKG ordered and pending.    Venous stasis ulcers of both lower extremities (HCC)   Longstanding history of venous stasis ulcers of the bilateral lower extremities.  Patient does somewhat regularly follow with outpatient wound care but even according to their notes that she frequently insists on caring for the wounds herself.  Ordering wound care team consultation this morning.    Controlled type 2 diabetes mellitus with circulatory disorder, without long-term current use of insulin ([place])     Accu-Cheks before every meal and nightly  with sliding scale insulin  Hemoglobin A1c ordered   Code Status:  DNR -confirmed with patient during this hospitalization. Family Communication: Deferred  Status is: Inpatient  Remains inpatient appropriate because:Ongoing active pain requiring inpatient pain management, Ongoing diagnostic testing needed not appropriate for outpatient work up, IV treatments appropriate due to intensity of illness or inability to take PO and Inpatient level of care appropriate due to severity of illness   Dispo: The patient is from: Home              Anticipated d/c is to: Home              Anticipated d/c date is: 3 days              Patient currently is not medically stable to d/c.         Marinda Elk MD [facility] Pager [phone]  If 7PM-7AM, please contact night-coverage [email or web address] Use universal [place] password for that web site. If you do not have the password, please call the hospital operator.  12/22/2019, 5:53 AM

## 2019-12-22 NOTE — Progress Notes (Signed)
Pt refused CBG check and 10 o'clock meds. Patient educated.

## 2019-12-22 NOTE — Progress Notes (Signed)
Reported  Received from [facility] in ED.

## 2019-12-23 DIAGNOSIS — L03116 Cellulitis of left lower limb: Secondary | ICD-10-CM | POA: Diagnosis not present

## 2019-12-23 DIAGNOSIS — E1159 Type 2 diabetes mellitus with other circulatory complications: Secondary | ICD-10-CM | POA: Diagnosis not present

## 2019-12-23 DIAGNOSIS — M86661 Other chronic osteomyelitis, right tibia and fibula: Secondary | ICD-10-CM | POA: Diagnosis not present

## 2019-12-23 DIAGNOSIS — I83019 Varicose veins of right lower extremity with ulcer of unspecified site: Secondary | ICD-10-CM | POA: Diagnosis not present

## 2019-12-23 DIAGNOSIS — L97919 Non-pressure chronic ulcer of unspecified part of right lower leg with unspecified severity: Secondary | ICD-10-CM

## 2019-12-23 DIAGNOSIS — M86261 Subacute osteomyelitis, right tibia and fibula: Secondary | ICD-10-CM

## 2019-12-23 DIAGNOSIS — L97929 Non-pressure chronic ulcer of unspecified part of left lower leg with unspecified severity: Secondary | ICD-10-CM

## 2019-12-23 DIAGNOSIS — M869 Osteomyelitis, unspecified: Secondary | ICD-10-CM | POA: Diagnosis not present

## 2019-12-23 DIAGNOSIS — I83029 Varicose veins of left lower extremity with ulcer of unspecified site: Secondary | ICD-10-CM

## 2019-12-23 DIAGNOSIS — L03115 Cellulitis of right lower limb: Secondary | ICD-10-CM

## 2019-12-23 LAB — COMPREHENSIVE METABOLIC PANEL
ALT: 10 U/L (ref 0–44)
AST: 16 U/L (ref 15–41)
Albumin: 2.9 g/dL — ABNORMAL LOW (ref 3.5–5.0)
Alkaline Phosphatase: 90 U/L (ref 38–126)
Anion gap: 11 (ref 5–15)
BUN: 18 mg/dL (ref 8–23)
CO2: 25 mmol/L (ref 22–32)
Calcium: 8.8 mg/dL — ABNORMAL LOW (ref 8.9–10.3)
Chloride: 100 mmol/L (ref 98–111)
Creatinine, Ser: 0.96 mg/dL (ref 0.44–1.00)
GFR calc Af Amer: >60 mL/min (ref >60)
GFR calc non Af Amer: >60 mL/min (ref >60)
Glucose, Bld: 97 mg/dL (ref 70–99)
Potassium: 4.2 mmol/L (ref 3.5–5.1)
Sodium: 136 mmol/L (ref 135–145)
Total Bilirubin: 0.9 mg/dL (ref 0.3–1.2)
Total Protein: 7.3 g/dL (ref 6.5–8.1)

## 2019-12-23 LAB — PROTIME-INR
INR: 1.2 (ref 0.8–1.2)
Prothrombin Time: 14.5 seconds (ref 11.4–15.2)

## 2019-12-23 LAB — CBC
HCT: 28.6 % — ABNORMAL LOW (ref 36.0–46.0)
Hemoglobin: 8.5 g/dL — ABNORMAL LOW (ref 12.0–15.0)
MCH: 25.2 pg — ABNORMAL LOW (ref 26.0–34.0)
MCHC: 29.7 g/dL — ABNORMAL LOW (ref 30.0–36.0)
MCV: 84.9 fL (ref 80.0–100.0)
Platelets: 230 10*3/uL (ref 150–400)
RBC: 3.37 MIL/uL — ABNORMAL LOW (ref 3.87–5.11)
RDW: 16.9 % — ABNORMAL HIGH (ref 11.5–15.5)
WBC: 6.2 10*3/uL (ref 4.0–10.5)
nRBC: 0 % (ref 0.0–0.2)

## 2019-12-23 LAB — GLUCOSE, CAPILLARY
Glucose-Capillary: 86 mg/dL (ref 70–99)
Glucose-Capillary: 106 mg/dL — ABNORMAL HIGH (ref 70–99)
Glucose-Capillary: 95 mg/dL (ref 70–99)

## 2019-12-23 LAB — APTT: aPTT: 33 seconds (ref 24–36)

## 2019-12-23 LAB — MRSA PCR SCREENING: MRSA by PCR: POSITIVE — AB

## 2019-12-23 NOTE — Progress Notes (Signed)
Patient partially removed dressing from bilateral lower extremities complaining they were painful. Dressing reinforced and pain medicine given.

## 2019-12-23 NOTE — Progress Notes (Signed)
[facility]  PROGRESS NOTE  [name]:[id number] DOB: 1951/07/28 DOA: 12/21/2019 PCP: [name], DO Admit date - 12/21/2019   Admitting Physician Marinda Elk, MD  Outpatient Primary MD for the patient is [name], DO  LOS - 1 Brief Narrative   [name] is a 69 y.o. year old female with medical history significant for atrial fibrillation/flutter, bipolar disorder, type 2 diabetes, anemia chronic venous ulceration wounds of bilateral legs with history of declining compression wrap for treatment as well as surgery follow-up wound care center who presented on 12/21/2019 with worsening pain from ulcers of the bilateral legs at her facility.On presentation patient was afebrile, hemodynamically stable work-up notable for soft tissue swelling of left fibula on x-ray without signs of osteo and x-ray right fibula compatible with chronic or acute on chronic osteomyelitis.  Patient was started empirically on vancomycin and ceftriaxone  Hospital course complicated by patient declining much of medical treatment particularly compression stockings, seems to be okay with IV antibiotics, does not want to consider any surgical interventions  Subjective  Today continues to decline compression wraps or surgeries.  Denies any pain.  No fevers or chills  A & P   Chronic bilateral lower extremity wounds with nonpurulent cellulitis in setting of chronic venous insufficiency, X-ray of left tibia/fibula/foot shows generalized soft tissue swelling without osseous findings.  Assessed by orthopedics notes improvement in appearance of these lesions from prior evaluation the cellulitis is present in setting of chronic venous insufficiency. On admission had drainage, foul smell, not present on my assessment though limited as patient declining me removing compression wraps -Currently on empiric vancomycin, cefepime -Patient does not want to consider any type of surgery, she does not  want compression wraps, she will consider compression stockings -Plan outpatient follow-up with orthopedics, Dr. Aldean Baker after discharge to provide follow-up long-term care for ulcer/infection  Chronic osteomyelitis of right fibula.  admitting x-ray shows periosteal scalloping of the fibula, consistent with chronic OM as reviewed by orthopedics.  Bipolar disorder.  Noted in chart.  Patient denies history of.Psychiatry consulted, patient went except medications to treat bipolar at this time, none imminent risk to self or others per their assessment -If patient willing will consider starting Neurontin 100 mg nightly for mood stabilization--patient already on neurontin 600 mg TID for neuropathy  Hypertension, at goal -Continue Lopressor 12.5 mg twice daily  Type 2 diabetes, controlled with peripheral neuropathy -Continue gabapentin -Sliding scale as needed, monitor CBGs  Normocytic anemia, chronic.  Hemoglobin stable at baseline.  No signs of bleeding. -Daily CBC    Family Communication  : None  Code Status : DNR  Disposition Plan  :  Patient is from SNF anticipated d/c date: 2 to 3 days. Barriers to d/c or necessity for inpatient status:  Currently on IV antibiotics for ruling out infection of bilateral lower extremity wounds Consults  : Orthopedics  Procedures  : None  DVT Prophylaxis  :  Lovenox   Lab Results  Component Value Date   PLT 230 12/23/2019    Diet :  Diet Order            Diet heart healthy/carb modified Room service appropriate? Yes; Fluid consistency: Thin  Diet effective now               Inpatient Medications Scheduled Meds: . DULoxetine  60 mg Oral Daily  . enoxaparin (LOVENOX) injection  40 mg Subcutaneous Q24H  . feeding supplement (PRO-STAT SUGAR FREE  64)  30 mL Oral TID  . gabapentin  600 mg Oral TID  . insulin aspart  0-15 Units Subcutaneous TID AC & HS  . metoprolol tartrate  12.5 mg Oral BID  . multivitamin  1 tablet Oral Daily  .  nutrition supplement (JUVEN)  1 packet Oral BID BM  . Ensure Max Protein  11 oz Oral Daily  . sodium chloride flush  10-40 mL Intracatheter Q12H  . sodium chloride flush  3 mL Intravenous Once   Continuous Infusions: . cefTRIAXone (ROCEPHIN)  IV 2 g (12/23/19 0509)  . vancomycin 750 mg (12/23/19 0654)   PRN Meds:.albuterol, hydrOXYzine, morphine injection **OR** morphine injection, sodium chloride flush  Antibiotics  :   Anti-infectives (From admission, onward)   Start     Dose/Rate Route Frequency Ordered Stop   12/23/19 0700  vancomycin (VANCOREADY) IVPB 750 mg/150 mL     750 mg 150 mL/hr over 60 Minutes Intravenous Every 24 hours 12/22/19 0544     12/22/19 0700  vancomycin (VANCOREADY) IVPB 1500 mg/300 mL     1,500 mg 150 mL/hr over 120 Minutes Intravenous  Once 12/22/19 0544 12/22/19 1239   12/22/19 0600  cefTRIAXone (ROCEPHIN) 2 g in sodium chloride 0.9 % 100 mL IVPB     2 g 200 mL/hr over 30 Minutes Intravenous Every 24 hours 12/22/19 0535     12/22/19 0130  ceFAZolin (ANCEF) IVPB 1 g/50 mL premix     1 g 100 mL/hr over 30 Minutes Intravenous  Once 12/22/19 0129 12/22/19 0235       Objective   Vitals:   12/23/19 1000 12/23/19 1408 12/23/19 1822 12/23/19 2056  BP: 137/69 (!) 151/84 (!) 147/93 133/65  Pulse: 84 94 84 79  Resp: 18 16 18 16   Temp: 99.3 F (37.4 C) 98.6 F (37 C) 98.9 F (37.2 C) 98.1 F (36.7 C)  TempSrc: Oral Oral Oral Oral  SpO2: 91% 90% 94% 94%  Weight:      Height:        SpO2: 94 %  Wt Readings from Last 3 Encounters:  12/21/19 80.7 kg  08/13/19 75 kg  06/28/19 77.7 kg     Intake/Output Summary (Last 24 hours) at 12/23/2019 2205 Last data filed at 12/23/2019 1853 Gross per 24 hour  Intake 590 ml  Output 900 ml  Net -310 ml    Physical Exam:     Awake Alert, Oriented X self, place, patient refusing to answer most orientation questions, very agitated [place].AT, Normal respiratory effort on room air, CTAB RRR, no obvious  edema +ve B.Sounds, Abd Soft, No tenderness, No rebound, guarding or rigidity. Bilateral lower extremities below knees wrapped in compression wraps   I have personally reviewed the following:   Data Reviewed:  CBC Recent Labs  Lab 12/21/19 2330 12/23/19 0507  WBC 4.8 6.2  HGB 8.5* 8.5*  HCT 29.2* 28.6*  PLT 231 230  MCV 84.6 84.9  MCH 24.6* 25.2*  MCHC 29.1* 29.7*  RDW 17.0* 16.9*  LYMPHSABS 1.2  --   MONOABS 0.5  --   EOSABS 0.3  --   BASOSABS 0.1  --     Chemistries  Recent Labs  Lab 12/21/19 2330 12/23/19 0507  NA 138 136  K 4.1 4.2  CL 103 100  CO2 27 25  GLUCOSE 110* 97  BUN 24* 18  CREATININE 1.25* 0.96  CALCIUM 8.8* 8.8*  AST 14* 16  ALT 11 10  ALKPHOS 88 90  BILITOT  0.5 0.9   ------------------------------------------------------------------------------------------------------------------ No results for input(s): CHOL, HDL, LDLCALC, TRIG, CHOLHDL, LDLDIRECT in the last 72 hours.  Lab Results  Component Value Date   HGBA1C 6.1 (H) 12/22/2019   ------------------------------------------------------------------------------------------------------------------ No results for input(s): TSH, T4TOTAL, T3FREE, THYROIDAB in the last 72 hours.  Invalid input(s): FREET3 ------------------------------------------------------------------------------------------------------------------ No results for input(s): VITAMINB12, FOLATE, FERRITIN, TIBC, IRON, RETICCTPCT in the last 72 hours.  Coagulation profile Recent Labs  Lab 12/23/19 0507  INR 1.2    No results for input(s): DDIMER in the last 72 hours.  Cardiac Enzymes No results for input(s): CKMB, TROPONINI, MYOGLOBIN in the last 168 hours.  Invalid input(s): CK ------------------------------------------------------------------------------------------------------------------    Component Value Date/Time   BNP 231.2 (H) 12/10/2017 1704    Micro Results Recent Results (from the past 240 hour(s))   Respiratory Panel by RT PCR (Flu A&B, Covid) - Nasopharyngeal Swab     Status: None   Collection Time: 12/22/19  6:49 AM   Specimen: Nasopharyngeal Swab  Result Value Ref Range Status   SARS Coronavirus 2 by RT PCR NEGATIVE NEGATIVE Final    Comment: (NOTE) SARS-CoV-2 target nucleic acids are NOT DETECTED. The SARS-CoV-2 RNA is generally detectable in upper respiratoy specimens during the acute phase of infection. The lowest concentration of SARS-CoV-2 viral copies this assay can detect is 131 copies/mL. A negative result does not preclude SARS-Cov-2 infection and should not be used as the sole basis for treatment or other patient management decisions. A negative result may occur with  improper specimen collection/handling, submission of specimen other than nasopharyngeal swab, presence of viral mutation(s) within the areas targeted by this assay, and inadequate number of viral copies (<131 copies/mL). A negative result must be combined with clinical observations, patient history, and epidemiological information. The expected result is Negative. Fact Sheet for Patients:  https://www.moore.com/ Fact Sheet for Healthcare Providers:  https://www.young.biz/ This test is not yet ap proved or cleared by the [place] FDA and  has been authorized for detection and/or diagnosis of SARS-CoV-2 by FDA under an Emergency Use Authorization (EUA). This EUA will remain  in effect (meaning this test can be used) for the duration of the COVID-19 declaration under Section 564(b)(1) of the Act, 21 U.S.C. section 360bbb-3(b)(1), unless the authorization is terminated or revoked sooner.    Influenza A by PCR NEGATIVE NEGATIVE Final   Influenza B by PCR NEGATIVE NEGATIVE Final    Comment: (NOTE) The Xpert Xpress SARS-CoV-2/FLU/RSV assay is intended as an aid in  the diagnosis of influenza from Nasopharyngeal swab specimens and  should not be used as a sole  basis for treatment. Nasal washings and  aspirates are unacceptable for Xpert Xpress SARS-CoV-2/FLU/RSV  testing. Fact Sheet for Patients: https://www.moore.com/ Fact Sheet for Healthcare Providers: https://www.young.biz/ This test is not yet approved or cleared by the [place] FDA and  has been authorized for detection and/or diagnosis of SARS-CoV-2 by  FDA under an Emergency Use Authorization (EUA). This EUA will remain  in effect (meaning this test can be used) for the duration of the  Covid-19 declaration under Section 564(b)(1) of the Act, 21  U.S.C. section 360bbb-3(b)(1), unless the authorization is  terminated or revoked. Performed at [facility], [address], [place], [place] [address]   Aerobic/Anaerobic Culture (surgical/deep wound)     Status: None (Preliminary result)   Collection Time: 12/22/19  7:00 AM   Specimen: Wound  Result Value Ref Range Status   Specimen Description   Final  WOUND RT LOWER LEG Performed at [facility] [address] [place], [place] [address]    Special Requests   Final    Normal Performed at [facility], [place] [address], [place], [place] [address]    Gram Stain   Final    FEW WBC PRESENT, PREDOMINANTLY PMN MODERATE GRAM NEGATIVE RODS    Culture   Final    ABUNDANT PSEUDOMONAS AERUGINOSA CULTURE REINCUBATED FOR BETTER GROWTH Performed at [facility], [place] [address], [place], [place] [address]    Report Status PENDING  Incomplete  Culture, blood (routine x 2)     Status: None (Preliminary result)   Collection Time: 12/22/19  1:17 PM   Specimen: BLOOD RIGHT FOREARM  Result Value Ref Range Status   Specimen Description   Final    BLOOD RIGHT FOREARM Performed at [facility] [address] [place], [place] [address]    Special Requests   Final    BOTTLES DRAWN AEROBIC ONLY Blood Culture results may  not be optimal due to an inadequate volume of blood received in culture bottles Performed at [facility] [address], [place], [place] [address]    Culture   Final    NO GROWTH < 24 HOURS Performed at [facility] [address], [place], [place] [address]    Report Status PENDING  Incomplete  Culture, blood (routine x 2)     Status: None (Preliminary result)   Collection Time: 12/22/19  1:44 PM   Specimen: BLOOD LEFT FOREARM  Result Value Ref Range Status   Specimen Description   Final    BLOOD LEFT FOREARM Performed at [facility] [address], [place], [place] [address]    Special Requests   Final    BOTTLES DRAWN AEROBIC ONLY Blood Culture adequate volume Performed at [facility] [address], [place], [place] [address]    Culture   Final    NO GROWTH < 24 HOURS Performed at [facility] [address], [place], [place] [address]    Report Status PENDING  Incomplete  MRSA PCR Screening     Status: Abnormal   Collection Time: 12/23/19 10:37 AM   Specimen: Nasopharyngeal  Result Value Ref Range Status   MRSA by PCR POSITIVE (A) NEGATIVE Final    Comment:        The GeneXpert MRSA Assay (FDA approved for NASAL specimens only), is one component of a comprehensive MRSA colonization surveillance program. It is not intended to diagnose MRSA infection nor to guide or monitor treatment for MRSA infections. RESULT CALLED TO, READ BACK BY AND VERIFIED WITH: D.BELFIELD AT 1438 ON 12/23/19 BY N.THOMPSON Performed at [facility], [place] [address], [place], [place] [address]     Radiology Reports DG Tibia/Fibula Left  Result Date: 12/22/2019 CLINICAL DATA:  Severe pain with open wounds on the lower extremity EXAM: LEFT TIBIA AND FIBULA - 2 VIEW COMPARISON:  08/12/2019 FINDINGS: Generalized soft tissue swelling and subcutaneous reticulation. The patient's wounds are likely seen as  excavation along the lateral leg. There is no underlying fracture or erosion. No opaque foreign body or soft tissue emphysema. Osteopenia and osteoarthritis of the knee. IMPRESSION: Soft tissue swelling without acute osseous finding, opaque foreign body, or soft tissue emphysema. Electronically Signed   By: Monte Fantasia M.D.   On: 12/22/2019 06:47   DG Tibia/Fibula Right  Result Date:  12/22/2019 CLINICAL DATA:  Anterior leg wound EXAM: RIGHT TIBIA AND FIBULA - 2 VIEW COMPARISON:  08/12/2019 FINDINGS: There is periosteal new bone formation along the anterolateral surface of the fibula. No osseous abnormality of the tibia. No fracture or dislocation. Large soft tissue wound anteriorly. IMPRESSION: Periosteal new bone formation along the anterolateral surface of the right fibula, compatible with chronic or acute-on-chronic osteomyelitis. Electronically Signed   By: Deatra RobinsonKevin  Herman M.D.   On: 12/22/2019 02:07   DG Foot 2 Views Left  Result Date: 12/22/2019 CLINICAL DATA:  Severe pain with open wounds the lower extremity EXAM: LEFT FOOT - 2 VIEW COMPARISON:  None. FINDINGS: Osteopenia.  No fracture, dislocation malalignment, or erosion. Skin thickening and excavations to the medial and lateral lower leg. No soft tissue emphysema or opaque foreign body. Hammertoe deformities on the lateral view.  Hallux valgus. IMPRESSION: Soft tissue swelling without acute osseous finding. Electronically Signed   By: Marnee SpringJonathon  Watts M.D.   On: 12/22/2019 06:48   DG Foot Complete Right  Result Date: 12/22/2019 CLINICAL DATA:  Soft tissue infection EXAM: RIGHT FOOT COMPLETE - 3+ VIEW COMPARISON:  None. FINDINGS: There is no evidence of fracture or dislocation. No osteolysis of the bones of the foot. There is metatarsus primus varus and hallux valgus. IMPRESSION: No radiographic evidence of osteomyelitis of the foot. Electronically Signed   By: Deatra RobinsonKevin  Herman M.D.   On: 12/22/2019 02:08     Time Spent in minutes   30     [name] M.D on 12/23/2019 at 10:05 PM  To page go to [email or web address] - password [email or web address]

## 2019-12-23 NOTE — Consult Note (Signed)
[facility] Face-to-Face Psychiatry Consult   Reason for Consult:  "Patient refusing treatments with history of bipolar" Referring Physician:  Dr Caleb Popp Patient Identification: [name] MRN:  [id number] Principal Diagnosis: Bilateral cellulitis of lower leg Diagnosis:  Principal Problem:   Bilateral cellulitis of lower leg Active Problems:   Atrial fibrillation (HCC)   Venous stasis ulcers of both lower extremities (HCC)   Controlled type 2 diabetes mellitus with circulatory disorder, without long-term current use of insulin (HCC)   Osteomyelitis of right fibula (HCC)   Total Time spent with patient: 30 minutes  Subjective:   [name] is a 69 y.o. female patient.  Patient assessed by nurse practitioner.  Patient alert, oriented to person and situation.  Patient appears anxious but does participate actively in assessment. Patient states "I do not have bipolar disorder, who said that?" Patient denies suicidal homicidal ideations.  Patient denies history of self-harm.  Patient denies auditory visual hallucinations. Patient reports she lives alone.  Patient denies any natural supports, "I have no one."  Patient denies access to weapons.  Patient denies alcohol and substance use.  Patient reports ability to complete ADLs and denies use of assistive devices. Patient reports sleep and appetite are "fine." Patient denies primary care provider, denies outpatient psychiatric provider.   HPI: Patient admitted with complaints of bilateral lower extremity pain, related to venous stasis ulcers of bilateral lower extremities.  Past Psychiatric History: Bipolar disorder, depression  Risk to Self:  Denies Risk to Others:  Denies Prior Inpatient Therapy:  Unknown Prior Outpatient Therapy:  Unknown  Past Medical History:  Past Medical History:  Diagnosis Date  . Arthritis   . Hypoglycemia   . Pneumonia, pneumococcal [facility])     Past Surgical History:  Procedure Laterality Date  . ABDOMINAL  HYSTERECTOMY    . CESAREAN SECTION     Family History:  Family History  Family history unknown: Yes   Family Psychiatric  History: Denies Social History:  Social History   Substance and Sexual Activity  Alcohol Use No     Social History   Substance and Sexual Activity  Drug Use No    Social History   Socioeconomic History  . Marital status: Single    Spouse name: Not on file  . Number of children: Not on file  . Years of education: Not on file  . Highest education level: Not on file  Occupational History  . Not on file  Tobacco Use  . Smoking status: Light Tobacco Smoker    Packs/day: 0.25  . Smokeless tobacco: Never Used  Substance and Sexual Activity  . Alcohol use: No  . Drug use: No  . Sexual activity: Not on file  Other Topics Concern  . Not on file  Social History Narrative  . Not on file   Social Determinants of Health   Financial Resource Strain:   . Difficulty of Paying Living Expenses:   Food Insecurity: Food Insecurity Present  . Worried About Programme researcher, broadcasting/film/video in the Last Year: Sometimes true  . Ran Out of Food in the Last Year: Sometimes true  Transportation Needs:   . Lack of Transportation (Medical):   Marland Kitchen Lack of Transportation (Non-Medical):   Physical Activity:   . Days of Exercise per Week:   . Minutes of Exercise per Session:   Stress:   . Feeling of Stress :   Social Connections:   . Frequency of Communication with Friends and Family:   . Frequency of  Social Gatherings with Friends and Family:   . Attends Religious Services:   . Active Member of Clubs or Organizations:   . Attends Banker Meetings:   Marland Kitchen Marital Status:    Additional Social History:    Allergies:   Allergies  Allergen Reactions  . Codeine Other (See Comments)    "Gets high"  . Tylenol [Acetaminophen] Hives    Labs:  Results for orders placed or performed during the hospital encounter of 12/21/19 (from the past 48 hour(s))  Lactic acid,  plasma     Status: None   Collection Time: 12/21/19 11:24 PM  Result Value Ref Range   Lactic Acid, Venous 0.7 0.5 - 1.9 mmol/L    Comment: Performed at [facility], [address] [place], [place] [address]  Comprehensive metabolic panel     Status: Abnormal   Collection Time: 12/21/19 11:30 PM  Result Value Ref Range   Sodium 138 135 - 145 mmol/L   Potassium 4.1 3.5 - 5.1 mmol/L   Chloride 103 98 - 111 mmol/L   CO2 27 22 - 32 mmol/L   Glucose, Bld 110 (H) 70 - 99 mg/dL    Comment: Glucose reference range applies only to samples taken after fasting for at least 8 hours.   BUN 24 (H) 8 - 23 mg/dL   Creatinine, Ser 1.19 (H) 0.44 - 1.00 mg/dL   Calcium 8.8 (L) 8.9 - 10.3 mg/dL   Total Protein 7.5 6.5 - 8.1 g/dL   Albumin 3.0 (L) 3.5 - 5.0 g/dL   AST 14 (L) 15 - 41 U/L   ALT 11 0 - 44 U/L   Alkaline Phosphatase 88 38 - 126 U/L   Total Bilirubin 0.5 0.3 - 1.2 mg/dL   GFR calc non Af Amer 44 (L) >60 mL/min   GFR calc Af Amer 51 (L) >60 mL/min   Anion gap 8 5 - 15    Comment: Performed at [facility], [address] [place], [place] [address]  CBC with Differential     Status: Abnormal   Collection Time: 12/21/19 11:30 PM  Result Value Ref Range   WBC 4.8 4.0 - 10.5 K/uL   RBC 3.45 (L) 3.87 - 5.11 MIL/uL   Hemoglobin 8.5 (L) 12.0 - 15.0 g/dL   HCT 95.6 (L) 21.3 - 08.6 %   MCV 84.6 80.0 - 100.0 fL   MCH 24.6 (L) 26.0 - 34.0 pg   MCHC 29.1 (L) 30.0 - 36.0 g/dL   RDW 57.8 (H) 46.9 - 62.9 %   Platelets 231 150 - 400 K/uL   nRBC 0.0 0.0 - 0.2 %   Neutrophils Relative % 58 %   Neutro Abs 2.8 1.7 - 7.7 K/uL   Lymphocytes Relative 25 %   Lymphs Abs 1.2 0.7 - 4.0 K/uL   Monocytes Relative 10 %   Monocytes Absolute 0.5 0.1 - 1.0 K/uL   Eosinophils Relative 6 %   Eosinophils Absolute 0.3 0.0 - 0.5 K/uL   Basophils Relative 1 %   Basophils Absolute 0.1 0.0 - 0.1 K/uL   Immature Granulocytes 0 %   Abs Immature Granulocytes 0.01 0.00 - 0.07  K/uL    Comment: Performed at [facility], [address], [place], [place] [address]  Respiratory Panel by RT PCR (Flu A&B, Covid) - Nasopharyngeal Swab     Status: None   Collection Time: 12/22/19  6:49 AM   Specimen: Nasopharyngeal Swab  Result Value  Ref Range   SARS Coronavirus 2 by RT PCR NEGATIVE NEGATIVE    Comment: (NOTE) SARS-CoV-2 target nucleic acids are NOT DETECTED. The SARS-CoV-2 RNA is generally detectable in upper respiratoy specimens during the acute phase of infection. The lowest concentration of SARS-CoV-2 viral copies this assay can detect is 131 copies/mL. A negative result does not preclude SARS-Cov-2 infection and should not be used as the sole basis for treatment or other patient management decisions. A negative result may occur with  improper specimen collection/handling, submission of specimen other than nasopharyngeal swab, presence of viral mutation(s) within the areas targeted by this assay, and inadequate number of viral copies (<131 copies/mL). A negative result must be combined with clinical observations, patient history, and epidemiological information. The expected result is Negative. Fact Sheet for Patients:  PinkCheek.be Fact Sheet for Healthcare Providers:  GravelBags.it This test is not yet ap proved or cleared by the [place] FDA and  has been authorized for detection and/or diagnosis of SARS-CoV-2 by FDA under an Emergency Use Authorization (EUA). This EUA will remain  in effect (meaning this test can be used) for the duration of the COVID-19 declaration under Section 564(b)(1) of the Act, 21 U.S.C. section 360bbb-3(b)(1), unless the authorization is terminated or revoked sooner.    Influenza A by PCR NEGATIVE NEGATIVE   Influenza B by PCR NEGATIVE NEGATIVE    Comment: (NOTE) The Xpert Xpress SARS-CoV-2/FLU/RSV assay is intended as an aid in  the diagnosis of  influenza from Nasopharyngeal swab specimens and  should not be used as a sole basis for treatment. Nasal washings and  aspirates are unacceptable for Xpert Xpress SARS-CoV-2/FLU/RSV  testing. Fact Sheet for Patients: PinkCheek.be Fact Sheet for Healthcare Providers: GravelBags.it This test is not yet approved or cleared by the [place] FDA and  has been authorized for detection and/or diagnosis of SARS-CoV-2 by  FDA under an Emergency Use Authorization (EUA). This EUA will remain  in effect (meaning this test can be used) for the duration of the  Covid-19 declaration under Section 564(b)(1) of the Act, 21  U.S.C. section 360bbb-3(b)(1), unless the authorization is  terminated or revoked. Performed at [facility], [place] [address] [place], [place] [address]   Aerobic/Anaerobic Culture (surgical/deep wound)     Status: None (Preliminary result)   Collection Time: 12/22/19  7:00 AM   Specimen: Wound  Result Value Ref Range   Specimen Description      WOUND RT LOWER LEG Performed at [facility] [address], [place], [place] [address]    Special Requests      Normal Performed at [facility], [place] [address], [place], [place] [address]    Gram Stain      FEW WBC PRESENT, PREDOMINANTLY PMN MODERATE GRAM NEGATIVE RODS    Culture      CULTURE REINCUBATED FOR BETTER GROWTH Performed at [facility], [place] [address], [place], [place] [address]    Report Status PENDING   CBG monitoring, ED     Status: None   Collection Time: 12/22/19  8:34 AM  Result Value Ref Range   Glucose-Capillary 87 70 - 99 mg/dL    Comment: Glucose reference range applies only to samples taken after fasting for at least 8 hours.  Culture, blood (routine x 2)     Status: None (Preliminary result)   Collection Time: 12/22/19  1:17 PM   Specimen: BLOOD RIGHT FOREARM  Result Value Ref  Range  Specimen Description      BLOOD RIGHT FOREARM Performed at [facility], [address] [place], [place] [address]    Special Requests      BOTTLES DRAWN AEROBIC ONLY Blood Culture results may not be optimal due to an inadequate volume of blood received in culture bottles Performed at [facility], [address] [place], [place] [address]    Culture      NO GROWTH < 24 HOURS Performed at [facility], [address] [place], [place] [address]    Report Status PENDING   Hemoglobin A1c     Status: Abnormal   Collection Time: 12/22/19  1:43 PM  Result Value Ref Range   Hgb A1c MFr Bld 6.1 (H) 4.8 - 5.6 %    Comment: (NOTE) Pre diabetes:          5.7%-6.4% Diabetes:              >6.4% Glycemic control for   <7.0% adults with diabetes    Mean Plasma Glucose 128.37 mg/dL    Comment: Performed at [facility], [address] [place], [place] [address]  HIV Antibody (routine testing w rflx)     Status: None   Collection Time: 12/22/19  1:43 PM  Result Value Ref Range   HIV Screen 4th Generation wRfx Non Reactive Non Reactive    Comment: Performed at [facility], [address] [place], [place] [address]  Sedimentation rate     Status: Abnormal   Collection Time: 12/22/19  1:43 PM  Result Value Ref Range   Sed Rate 102 (H) 0 - 22 mm/hr    Comment: Performed at [facility], [address] [place], [place] [address]  C-reactive protein     Status: Abnormal   Collection Time: 12/22/19  1:43 PM  Result Value Ref Range   CRP 3.9 (H) <1.0 mg/dL    Comment: Performed at [facility], [address] [place], [place] [address]  Prealbumin     Status: Abnormal   Collection Time: 12/22/19  1:43 PM  Result Value Ref Range   Prealbumin 12.5 (L) 18 - 38 mg/dL    Comment: Performed at [facility], [address] [place], [place] [address]  Culture, blood (routine  x 2)     Status: None (Preliminary result)   Collection Time: 12/22/19  1:44 PM   Specimen: BLOOD LEFT FOREARM  Result Value Ref Range   Specimen Description      BLOOD LEFT FOREARM Performed at [facility], [address] [place], [place] [address]    Special Requests      BOTTLES DRAWN AEROBIC ONLY Blood Culture adequate volume Performed at [facility], [address] [place], [place] [address]    Culture      NO GROWTH < 24 HOURS Performed at [facility], [address] [place], [place] [address]    Report Status PENDING   Glucose, capillary     Status: None   Collection Time: 12/22/19  4:36 PM  Result Value Ref Range   Glucose-Capillary 91 70 - 99 mg/dL    Comment: Glucose reference range applies only to samples taken after fasting for at least 8 hours.  Comprehensive metabolic panel     Status: Abnormal   Collection Time: 12/23/19  5:07 AM  Result Value Ref Range   Sodium 136 135 -  145 mmol/L   Potassium 4.2 3.5 - 5.1 mmol/L   Chloride 100 98 - 111 mmol/L   CO2 25 22 - 32 mmol/L   Glucose, Bld 97 70 - 99 mg/dL    Comment: Glucose reference range applies only to samples taken after fasting for at least 8 hours.   BUN 18 8 - 23 mg/dL   Creatinine, Ser 1.61 0.44 - 1.00 mg/dL   Calcium 8.8 (L) 8.9 - 10.3 mg/dL   Total Protein 7.3 6.5 - 8.1 g/dL   Albumin 2.9 (L) 3.5 - 5.0 g/dL   AST 16 15 - 41 U/L   ALT 10 0 - 44 U/L   Alkaline Phosphatase 90 38 - 126 U/L   Total Bilirubin 0.9 0.3 - 1.2 mg/dL   GFR calc non Af Amer >60 >60 mL/min   GFR calc Af Amer >60 >60 mL/min   Anion gap 11 5 - 15    Comment: Performed at [facility], [address] [place], [place] [address]  CBC     Status: Abnormal   Collection Time: 12/23/19  5:07 AM  Result Value Ref Range   WBC 6.2 4.0 - 10.5 K/uL   RBC 3.37 (L) 3.87 - 5.11 MIL/uL   Hemoglobin 8.5 (L) 12.0 - 15.0 g/dL   HCT 54.0 (L) 98.1 - 19.1 %   MCV 84.9 80.0 -  100.0 fL   MCH 25.2 (L) 26.0 - 34.0 pg   MCHC 29.7 (L) 30.0 - 36.0 g/dL   RDW 47.8 (H) 29.5 - 62.1 %   Platelets 230 150 - 400 K/uL   nRBC 0.0 0.0 - 0.2 %    Comment: Performed at [facility], [address] [place], [place] [address]  Protime-INR     Status: None   Collection Time: 12/23/19  5:07 AM  Result Value Ref Range   Prothrombin Time 14.5 11.4 - 15.2 seconds   INR 1.2 0.8 - 1.2    Comment: (NOTE) INR goal varies based on device and disease states. Performed at [facility], [address], [place], [place] [address]   APTT     Status: None   Collection Time: 12/23/19  5:07 AM  Result Value Ref Range   aPTT 33 24 - 36 seconds    Comment: Performed at [facility], [address], [place], [place] [address]  Glucose, capillary     Status: None   Collection Time: 12/23/19  7:43 AM  Result Value Ref Range   Glucose-Capillary 86 70 - 99 mg/dL    Comment: Glucose reference range applies only to samples taken after fasting for at least 8 hours.    Current Facility-Administered Medications  Medication Dose Route Frequency Provider Last Rate Last Admin  . albuterol (PROVENTIL) (2.5 MG/3ML) 0.083% nebulizer solution 2.5 mg  2.5 mg Nebulization Q6H PRN Alwyn Ren, MD      . cefTRIAXone (ROCEPHIN) 2 g in sodium chloride 0.9 % 100 mL IVPB  2 g Intravenous Q24H Marinda Elk, MD 200 mL/hr at 12/23/19 0509 2 g at 12/23/19 0509  . DULoxetine (CYMBALTA) DR capsule 60 mg  60 mg Oral Daily Shalhoub, Deno Lunger, MD   60 mg at 12/23/19 1033  . enoxaparin (LOVENOX) injection 40 mg  40 mg Subcutaneous Q24H Shalhoub, Deno Lunger, MD      . feeding supplement (PRO-STAT SUGAR FREE 64) liquid 30 mL  30 mL Oral TID Alwyn Ren, MD      .  gabapentin (NEURONTIN) capsule 600 mg  600 mg Oral TID Marinda Elk, MD   600 mg at 12/23/19 1033  . hydrOXYzine (ATARAX/VISTARIL) tablet 25 mg  25 mg Oral Q8H PRN Marinda Elk, MD   25 mg at 12/22/19 1349  . insulin aspart (novoLOG) injection 0-15 Units  0-15 Units Subcutaneous TID AC & HS Shalhoub, Deno Lunger, MD      . metoprolol tartrate (LOPRESSOR) tablet 12.5 mg  12.5 mg Oral BID Marinda Elk, MD   12.5 mg at 12/23/19 1033  . morphine 2 MG/ML injection 2 mg  2 mg Intravenous Q4H PRN Shalhoub, Deno Lunger, MD   2 mg at 12/23/19 2119   Or  . morphine 4 MG/ML injection 4 mg  4 mg Intravenous Q4H PRN Shalhoub, Deno Lunger, MD      . multivitamin (PROSIGHT) tablet 1 tablet  1 tablet Oral Daily Alwyn Ren, MD   1 tablet at 12/23/19 1033  . nutrition supplement (JUVEN) (JUVEN) powder packet 1 packet  1 packet Oral BID BM Alwyn Ren, MD   1 packet at 12/23/19 1034  . protein supplement (ENSURE MAX) liquid  11 oz Oral Daily Alwyn Ren, MD   11 oz at 12/22/19 1926  . sodium chloride flush (NS) 0.9 % injection 10-40 mL  10-40 mL Intracatheter Q12H Alwyn Ren, MD   10 mL at 12/22/19 2239  . sodium chloride flush (NS) 0.9 % injection 10-40 mL  10-40 mL Intracatheter PRN Alwyn Ren, MD      . sodium chloride flush (NS) 0.9 % injection 3 mL  3 mL Intravenous Once Shalhoub, Deno Lunger, MD      . vancomycin (VANCOREADY) IVPB 750 mg/150 mL  750 mg Intravenous Q24H Marinda Elk, MD 150 mL/hr at 12/23/19 0654 750 mg at 12/23/19 0654    Musculoskeletal: Strength & Muscle Tone: within normal limits Gait & Station: Unable to assess Patient leans: N/A  Psychiatric Specialty Exam: Physical Exam  Nursing note and vitals reviewed. Constitutional: She is oriented to person, place, and time. She appears well-developed.  HENT:  Head: Normocephalic.  Cardiovascular: Normal rate.  Respiratory: Effort normal.  Neurological: She is alert and oriented to person, place, and time.  Psychiatric: Her speech is normal and behavior is normal. Judgment and thought content normal. Her mood appears anxious. Cognition and memory are impaired.     Review of Systems  Constitutional: Negative.   HENT: Negative.   Eyes: Negative.   Respiratory: Negative.   Cardiovascular: Negative.   Gastrointestinal: Negative.   Genitourinary: Negative.   Musculoskeletal: Negative.   Skin: Negative.   Neurological: Negative.   Psychiatric/Behavioral: The patient is nervous/anxious.     Blood pressure 137/69, pulse 84, temperature 99.3 F (37.4 C), temperature source Oral, resp. rate 18, height 5\' 6"  (1.676 m), weight 80.7 kg, SpO2 91 %.Body mass index is 28.73 kg/m.  General Appearance: Casual and Fairly Groomed  Eye Contact:  Fair  Speech:  Clear and Coherent and Normal Rate  Volume:  Normal  Mood:  Anxious  Affect:  Congruent  Thought Process:  Coherent, Goal Directed and Descriptions of Associations: Intact  Orientation:  Other:  Patient oriented to self and situation only.  Thought Content:  Logical  Suicidal Thoughts:  No  Homicidal Thoughts:  No  Memory:  Immediate;   Fair Recent;   Fair Remote;   Fair  Judgement:  Fair  Insight:  Lacking  Psychomotor Activity:  Normal  Concentration:  Concentration: Good and Attention Span: Good  Recall:  Good  Fund of Knowledge:  Good  Language:  Good  Akathisia:  No  Handed:  Right  AIMS (if indicated):     Assets:  Communication Skills Desire for Improvement Financial Resources/Insurance Housing Intimacy Leisure Time Physical Health Resilience  ADL's:  Unable to assess   Cognition:  Impaired,  Mild  Sleep:        Treatment Plan Summary: Patient discussed with Dr Lucianne Muss  While patient denies history of bipolar disorder review of medical record indicates history of documented bipolar disorder.  Attempted to discuss treatment including medications with patient, patient does not appear to be willing to accept medications to treat bipolar disorder at this time. Recommend consider Neurontin 100 mg nightly to assist with mood stabilization.   Disposition: No evidence of imminent  risk to self or others at present.   Patient does not meet criteria for psychiatric inpatient admission. Discussed crisis plan, support from social network, calling 911, coming to the Emergency Department, and calling Suicide Hotline.  Patrcia Dolly, FNP 12/23/2019 11:17 AM

## 2019-12-23 NOTE — Progress Notes (Signed)
Patient became more irritated and irate after she had her psych consult. Patient is refusing baths and prevalon boots. Will continue to monitor.

## 2019-12-23 NOTE — Consult Note (Signed)
ORTHOPAEDIC CONSULTATION  REQUESTING PHYSICIAN: Desiree Hane, MD  Chief Complaint: Painful ulcers bilateral legs  HPI: [name] is a 69 y.o. female who presents with chronic venous ulceration bilateral legs.  Patient was seen on her previous admission with cellulitis and much larger venous ulcerations.  Patient has refused any type of compression wrap for treatment.  Patient has refused surgery.  Patient was to follow-up with the wound care center and she states that she will not go back to the wound center again.  Patient will only allow lightly wrapped Curlex and Coban wraps for her legs.  Past Medical History:  Diagnosis Date  . Arthritis   . Hypoglycemia   . Pneumonia, pneumococcal [facility])    Past Surgical History:  Procedure Laterality Date  . ABDOMINAL HYSTERECTOMY    . CESAREAN SECTION     Social History   Socioeconomic History  . Marital status: Single    Spouse name: Not on file  . Number of children: Not on file  . Years of education: Not on file  . Highest education level: Not on file  Occupational History  . Not on file  Tobacco Use  . Smoking status: Light Tobacco Smoker    Packs/day: 0.25  . Smokeless tobacco: Never Used  Substance and Sexual Activity  . Alcohol use: No  . Drug use: No  . Sexual activity: Not on file  Other Topics Concern  . Not on file  Social History Narrative  . Not on file   Social Determinants of Health   Financial Resource Strain:   . Difficulty of Paying Living Expenses:   Food Insecurity: Food Insecurity Present  . Worried About Charity fundraiser in the Last Year: Sometimes true  . Ran Out of Food in the Last Year: Sometimes true  Transportation Needs:   . Lack of Transportation (Medical):   Marland Kitchen Lack of Transportation (Non-Medical):   Physical Activity:   . Days of Exercise per Week:   . Minutes of Exercise per Session:   Stress:   . Feeling of Stress :   Social Connections:   . Frequency of Communication  with Friends and Family:   . Frequency of Social Gatherings with Friends and Family:   . Attends Religious Services:   . Active Member of Clubs or Organizations:   . Attends Archivist Meetings:   Marland Kitchen Marital Status:    Family History  Family history unknown: Yes   - negative except otherwise stated in the family history section Allergies  Allergen Reactions  . Codeine Other (See Comments)    "Gets high"  . Tylenol [Acetaminophen] Hives   Prior to Admission medications   Medication Sig Start Date End Date Taking? Authorizing Provider  DULoxetine (CYMBALTA) 60 MG capsule Take 60 mg by mouth daily.   Yes [provider]  gabapentin (NEURONTIN) 300 MG capsule Take 2 capsules (600 mg total) by mouth 3 (three) times daily. 06/29/19 12/22/19 Yes Guadalupe Dawn, MD  hydrOXYzine (ATARAX/VISTARIL) 25 MG tablet Take 1 tablet (25 mg total) by mouth every 8 (eight) hours as needed for itching. 11/09/19  Yes Milton Ferguson, MD  metoprolol tartrate (LOPRESSOR) 25 MG tablet Take 0.5 tablets (12.5 mg total) by mouth 2 (two) times daily. 06/29/19 12/22/19 Yes Guadalupe Dawn, MD  naproxen sodium (ALEVE) 220 MG tablet Take 220 mg by mouth daily as needed (pain).   Yes [provider]  traMADol (ULTRAM) 50 MG tablet Take by mouth every 12 (  twelve) hours as needed for moderate pain.   Yes [provider]  DULoxetine HCl 40 MG CPEP Take 40 mg by mouth daily. Patient not taking: Reported on 12/22/2019 06/29/19   Myrene Buddy, MD  Hydrocortisone (GERHARDT'S BUTT CREAM) CREA Apply 1 application topically 2 (two) times daily. Patient not taking: Reported on 12/22/2019 06/28/19   Melene Plan, MD  furosemide (LASIX) 20 MG tablet Take 0.5 tablets (10 mg total) by mouth daily. Patient not taking: Reported on 04/18/2018 12/10/17 05/04/19  Arthor Captain, PA-C   DG Tibia/Fibula Left  Result Date: 12/22/2019 CLINICAL DATA:  Severe pain with open wounds on the lower extremity EXAM: LEFT  TIBIA AND FIBULA - 2 VIEW COMPARISON:  08/12/2019 FINDINGS: Generalized soft tissue swelling and subcutaneous reticulation. The patient's wounds are likely seen as excavation along the lateral leg. There is no underlying fracture or erosion. No opaque foreign body or soft tissue emphysema. Osteopenia and osteoarthritis of the knee. IMPRESSION: Soft tissue swelling without acute osseous finding, opaque foreign body, or soft tissue emphysema. Electronically Signed   By: Marnee Spring M.D.   On: 12/22/2019 06:47   DG Tibia/Fibula Right  Result Date: 12/22/2019 CLINICAL DATA:  Anterior leg wound EXAM: RIGHT TIBIA AND FIBULA - 2 VIEW COMPARISON:  08/12/2019 FINDINGS: There is periosteal new bone formation along the anterolateral surface of the fibula. No osseous abnormality of the tibia. No fracture or dislocation. Large soft tissue wound anteriorly. IMPRESSION: Periosteal new bone formation along the anterolateral surface of the right fibula, compatible with chronic or acute-on-chronic osteomyelitis. Electronically Signed   By: Deatra Robinson M.D.   On: 12/22/2019 02:07   DG Foot 2 Views Left  Result Date: 12/22/2019 CLINICAL DATA:  Severe pain with open wounds the lower extremity EXAM: LEFT FOOT - 2 VIEW COMPARISON:  None. FINDINGS: Osteopenia.  No fracture, dislocation malalignment, or erosion. Skin thickening and excavations to the medial and lateral lower leg. No soft tissue emphysema or opaque foreign body. Hammertoe deformities on the lateral view.  Hallux valgus. IMPRESSION: Soft tissue swelling without acute osseous finding. Electronically Signed   By: Marnee Spring M.D.   On: 12/22/2019 06:48   DG Foot Complete Right  Result Date: 12/22/2019 CLINICAL DATA:  Soft tissue infection EXAM: RIGHT FOOT COMPLETE - 3+ VIEW COMPARISON:  None. FINDINGS: There is no evidence of fracture or dislocation. No osteolysis of the bones of the foot. There is metatarsus primus varus and hallux valgus. IMPRESSION: No  radiographic evidence of osteomyelitis of the foot. Electronically Signed   By: Deatra Robinson M.D.   On: 12/22/2019 02:08   - pertinent xrays, CT, MRI studies were reviewed and independently interpreted  Positive ROS: All other systems have been reviewed and were otherwise negative with the exception of those mentioned in the HPI and as above.  Physical Exam: General: Alert, no acute distress Psychiatric: Patient is competent for consent with normal mood and affect Lymphatic: No axillary or cervical lymphadenopathy Cardiovascular: No pedal edema Respiratory: No cyanosis, no use of accessory musculature GI: No organomegaly, abdomen is soft and non-tender    Images:  @ENCIMAGES @  Labs:  Lab Results  Component Value Date   HGBA1C 6.1 (H) 12/22/2019   HGBA1C 6.6 (H) 05/04/2019   ESRSEDRATE 102 (H) 12/22/2019   ESRSEDRATE 129 (H) 06/18/2019   CRP 3.9 (H) 12/22/2019   CRP 21.8 (H) 06/18/2019   CRP 16.2 (H) 05/05/2019   REPTSTATUS PENDING 12/22/2019   GRAMSTAIN  12/22/2019  FEW WBC PRESENT, PREDOMINANTLY PMN MODERATE GRAM NEGATIVE RODS Performed at [facility], [address] [place], [place] [address]    CULT PENDING 12/22/2019   LABORGA ESCHERICHIA COLI (A) 05/03/2019    Lab Results  Component Value Date   ALBUMIN 2.9 (L) 12/23/2019   ALBUMIN 3.0 (L) 12/21/2019   ALBUMIN 2.7 (L) 11/09/2019   PREALBUMIN 12.5 (L) 12/22/2019    Neurologic: Patient does not have protective sensation bilateral lower extremities.   MUSCULOSKELETAL:   Skin: Patient would not allow her dressings to be changed this morning.  Photographs of her legs show that the ulcers are smaller than they were on her previous admission.  Patient does have cellulitis in her legs with swelling chronic venous insufficiency.  Review the radiographs of both feet shows no evidence of infection.  Review of the radiographs of the right tibia shows periosteal scalloping of the fibula beneath the chronic  ulcer consistent with chronic osteomyelitis. Assessment: Assessment: Chronic venous insufficiency ulcers bilateral lower extremities with cellulitis and chronic osteomyelitis of the shaft of the right fibula.  Plan: Plan: Discussed with the patient treatment options including surgery and compression wraps.  Patient states that she does not want to consider any type of surgery states that she does not want to consider compression wraps.  She said she would consider compression stockings.  I will plan to follow-up in the office after discharge and provide follow-up  long-term care for the ulcers and infection.  Thank you for the consult and the opportunity to see Ms. Armstead Peaks, MD [facility] [phone] 7:13 AM

## 2019-12-24 DIAGNOSIS — M869 Osteomyelitis, unspecified: Secondary | ICD-10-CM | POA: Diagnosis not present

## 2019-12-24 DIAGNOSIS — A498 Other bacterial infections of unspecified site: Secondary | ICD-10-CM

## 2019-12-24 DIAGNOSIS — L03115 Cellulitis of right lower limb: Secondary | ICD-10-CM | POA: Diagnosis not present

## 2019-12-24 DIAGNOSIS — M86661 Other chronic osteomyelitis, right tibia and fibula: Secondary | ICD-10-CM | POA: Diagnosis not present

## 2019-12-24 DIAGNOSIS — L03116 Cellulitis of left lower limb: Secondary | ICD-10-CM | POA: Diagnosis not present

## 2019-12-24 DIAGNOSIS — E1159 Type 2 diabetes mellitus with other circulatory complications: Secondary | ICD-10-CM | POA: Diagnosis not present

## 2019-12-24 LAB — GLUCOSE, CAPILLARY
Glucose-Capillary: 127 mg/dL — ABNORMAL HIGH (ref 70–99)
Glucose-Capillary: 125 mg/dL — ABNORMAL HIGH (ref 70–99)
Glucose-Capillary: 79 mg/dL (ref 70–99)
Glucose-Capillary: 115 mg/dL — ABNORMAL HIGH (ref 70–99)
Glucose-Capillary: 101 mg/dL — ABNORMAL HIGH (ref 70–99)

## 2019-12-24 LAB — CBC
HCT: 27.7 % — ABNORMAL LOW (ref 36.0–46.0)
Hemoglobin: 8.1 g/dL — ABNORMAL LOW (ref 12.0–15.0)
MCH: 24.7 pg — ABNORMAL LOW (ref 26.0–34.0)
MCHC: 29.2 g/dL — ABNORMAL LOW (ref 30.0–36.0)
MCV: 84.5 fL (ref 80.0–100.0)
Platelets: 196 10*3/uL (ref 150–400)
RBC: 3.28 MIL/uL — ABNORMAL LOW (ref 3.87–5.11)
RDW: 16.9 % — ABNORMAL HIGH (ref 11.5–15.5)
WBC: 4 10*3/uL (ref 4.0–10.5)
nRBC: 0 % (ref 0.0–0.2)

## 2019-12-24 MED ORDER — PIPERACILLIN-TAZOBACTAM 3.375 G IVPB
3.3750 g | Freq: Three times a day (TID) | INTRAVENOUS | Status: DC
  Administered 2019-12-24 – 2019-12-25 (×4): 3.375 g via INTRAVENOUS
  Filled 2019-12-24 (×4): qty 50

## 2019-12-24 NOTE — TOC Initial Note (Signed)
Transition of Care [facility]) - Initial/Assessment Note    Patient Details  Name: [name] MRN: [id number] Date of Birth: 09/23/1950  [facility] CM/SW Contact:    Kenndra Morris, Meriam Sprague, RN Phone Number: 12/24/2019, 11:07 AM  Clinical Narrative:                 Pt is a long term care resident at [facility] and can dc back there at discharge. TOC will continue to follow along.  Expected Discharge Plan: Long Term Nursing Home Barriers to Discharge: Continued Medical Work up   Expected Discharge Plan and Services Expected Discharge Plan: Long Term Nursing Home   Discharge Planning Services: CM Consult   Living arrangements for the past 2 months: Skilled Nursing Facility                   Prior Living Arrangements/Services Living arrangements for the past 2 months: Skilled Nursing Facility Lives with:: Facility Resident                   Activities of Daily Living Home Assistive Devices/Equipment: Eyeglasses ADL Screening (condition at time of admission) Patient's cognitive ability adequate to safely complete daily activities?: Yes Is the patient deaf or have difficulty hearing?: No Does the patient have difficulty seeing, even when wearing glasses/contacts?: No Does the patient have difficulty concentrating, remembering, or making decisions?: No Patient able to express need for assistance with ADLs?: Yes Does the patient have difficulty dressing or bathing?: No Independently performs ADLs?: Yes (appropriate for developmental age) Does the patient have difficulty walking or climbing stairs?: No Weakness of Legs: None Weakness of Arms/Hands: None   Admission diagnosis:  Osteomyelitis (HCC) [M86.9] Cellulitis of right lower extremity [L03.115] Bilateral cellulitis of lower leg [L03.116, L03.115] Patient Active Problem List   Diagnosis Date Noted  . Controlled type 2 diabetes mellitus with circulatory disorder, without long-term current use of insulin (HCC)  12/22/2019  . Osteomyelitis of right fibula (HCC) 12/22/2019  . Severe protein-calorie malnutrition (HCC)   . Venous stasis ulcers of both lower extremities (HCC)   . Bilateral cellulitis of lower leg 08/12/2019  . 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 Pressure injury of skin 06/19/2019  . Elevated C-reactive protein (CRP)   . Leg ulcer (HCC)   . Adjustment disorder with mixed disturbance of emotions and conduct   . Type 2 diabetes mellitus with hyperglycemia (HCC) 05/04/2019  . Infected ulcer of skin (HCC)   . High risk social situation   . Post-polio syndrome   . Chronic pain syndrome   . Limited mobility 04/20/2019  . Venous stasis ulcer (HCC) 03/31/2019  . Overgrown toenails 10/28/2018  . Wound of right leg 02/06/2018  . Atrial fibrillation (HCC) 01/03/2018  . Anemia 01/03/2018  . Elevated serum creatinine 01/03/2018  . Bipolar I disorder with mania (HCC) 01/03/2018   PCP:  [name], DO Pharmacy:   [facility] - [place], [place] - [address] [place] [address] Phone: [phone] Fax: [phone]  [facility] - [place], [place] - [address] [place] [address] Phone: [phone] Fax: [phone]     Social Determinants of Health (SDOH) Interventions    Readmission Risk Interventions No flowsheet data found.

## 2019-12-24 NOTE — Progress Notes (Signed)
[facility]  PROGRESS NOTE  [name] ZOX:[id number] DOB: 07/19/1951 DOA: 12/21/2019 PCP: Unknown [name], [name], DO Admit date - 12/21/2019   Admitting Physician Marinda ElkGeorge J Shalhoub, MD  Outpatient Primary MD for the patient is Unknown [name], [name], DO  LOS - 2 Brief Narrative   [name] is a 69 y.o. year old female with medical history significant for atrial fibrillation/flutter, bipolar disorder, type 2 diabetes, anemia chronic venous ulceration wounds of bilateral legs with history of declining compression wrap for treatment as well as surgery follow-up wound care center who presented on 12/21/2019 with worsening pain from ulcers of the bilateral legs at her facility.On presentation patient was afebrile, hemodynamically stable work-up notable for soft tissue swelling of left fibula on x-ray without signs of osteo and x-ray right fibula compatible with chronic or acute on chronic osteomyelitis.  Patient was started empirically on vancomycin and ceftriaxone  Hospital course complicated by patient declining much of medical treatment particularly compression stockings, seems to be okay with IV antibiotics, does not want to consider any surgical interventions  Subjective  Today continues to decline compression wraps or surgeries.  Denies any pain.  No fevers or chills  A & P   Chronic bilateral lower extremity wounds with nonpurulent cellulitis in setting of chronic venous insufficiency, X-ray of left tibia/fibula/foot shows generalized soft tissue swelling without osseous findings.  Assessed by orthopedics notes improvement in appearance of these lesions from prior evaluation the cellulitis is present in setting of chronic venous insufficiency. On admission had drainage, foul smell, swab of wound positive for abundant Pseudomonas, MRSA PCR positive however blood cultures unremarkable, no MRSA on skin cultures -DC vancomycin -Start Zosyn for pseudomonal coverage while awaiting  sensitivities -Patient does not want to consider any type of surgery, she does not want compression wraps, she will consider compression stockings -Plan outpatient follow-up with orthopedics, Dr. Aldean BakerMarcus Duda after discharge to provide follow-up long-term care for ulcer/infection  Chronic osteomyelitis of right fibula.  admitting x-ray shows periosteal scalloping of the fibula, consistent with chronic OM as reviewed by orthopedics.  Bipolar disorder.  Noted in chart.  Patient denies history of.Psychiatry consulted, patient went except medications to treat bipolar at this time, none imminent risk to self or others per their assessment -If patient willing will consider starting Neurontin 100 mg nightly for mood stabilization--patient already on neurontin 600 mg TID for neuropathy  Hypertension, at goal -Continue Lopressor 12.5 mg twice daily  Type 2 diabetes, controlled with peripheral neuropathy -Continue gabapentin -Sliding scale as needed, monitor CBGs  Normocytic anemia, chronic.  Hemoglobin stable at baseline.  No signs of bleeding. -Daily CBC    Family Communication  : None  Code Status : DNR  Disposition Plan  :  Patient is from SNF anticipated d/c date: 2 to 3 days. Barriers to d/c or necessity for inpatient status:  Currently on IV Zosyn for likely pseudomonal infection, awaiting urine culture sensitivities Consults  : Orthopedics  Procedures  : None  DVT Prophylaxis  :  Lovenox   Lab Results  Component Value Date   PLT 196 12/24/2019    Diet :  Diet Order            Diet heart healthy/carb modified Room service appropriate? Yes; Fluid consistency: Thin  Diet effective now               Inpatient Medications Scheduled Meds: . DULoxetine  60 mg Oral Daily  . enoxaparin (LOVENOX) injection  40 mg  Subcutaneous Q24H  . feeding supplement (PRO-STAT SUGAR FREE 64)  30 mL Oral TID  . gabapentin  600 mg Oral TID  . insulin aspart  0-15 Units Subcutaneous TID AC & HS   . metoprolol tartrate  12.5 mg Oral BID  . multivitamin  1 tablet Oral Daily  . nutrition supplement (JUVEN)  1 packet Oral BID BM  . Ensure Max Protein  11 oz Oral Daily  . sodium chloride flush  10-40 mL Intracatheter Q12H  . sodium chloride flush  3 mL Intravenous Once   Continuous Infusions: . piperacillin-tazobactam (ZOSYN)  IV 12.5 mL/hr at 12/24/19 1842   PRN Meds:.albuterol, hydrOXYzine, morphine injection **OR** morphine injection, sodium chloride flush  Antibiotics  :   Anti-infectives (From admission, onward)   Start     Dose/Rate Route Frequency Ordered Stop   12/24/19 1500  piperacillin-tazobactam (ZOSYN) IVPB 3.375 g     3.375 g 12.5 mL/hr over 240 Minutes Intravenous Every 8 hours 12/24/19 1418     12/23/19 0700  vancomycin (VANCOREADY) IVPB 750 mg/150 mL  Status:  Discontinued     750 mg 150 mL/hr over 60 Minutes Intravenous Every 24 hours 12/22/19 0544 12/24/19 1307   12/22/19 0700  vancomycin (VANCOREADY) IVPB 1500 mg/300 mL     1,500 mg 150 mL/hr over 120 Minutes Intravenous  Once 12/22/19 0544 12/22/19 1239   12/22/19 0600  cefTRIAXone (ROCEPHIN) 2 g in sodium chloride 0.9 % 100 mL IVPB  Status:  Discontinued     2 g 200 mL/hr over 30 Minutes Intravenous Every 24 hours 12/22/19 0535 12/24/19 1307   12/22/19 0130  ceFAZolin (ANCEF) IVPB 1 g/50 mL premix     1 g 100 mL/hr over 30 Minutes Intravenous  Once 12/22/19 0129 12/22/19 0235       Objective   Vitals:   12/23/19 2056 12/24/19 0605 12/24/19 1344 12/24/19 2152  BP: 133/65 119/62 (!) 83/50 137/89  Pulse: 79 87 83 81  Resp: 16 14 16 18   Temp: 98.1 F (36.7 C) 98.4 F (36.9 C) 99 F (37.2 C) 98.3 F (36.8 C)  TempSrc: Oral Oral Oral Oral  SpO2: 94% 91% 94% 95%  Weight:      Height:        SpO2: 95 %  Wt Readings from Last 3 Encounters:  12/21/19 80.7 kg  08/13/19 75 kg  06/28/19 77.7 kg     Intake/Output Summary (Last 24 hours) at 12/24/2019 2240 Last data filed at 12/24/2019  1842 Gross per 24 hour  Intake 646.43 ml  Output 2200 ml  Net -1553.57 ml    Physical Exam:     Awake Alert, Oriented X self, place, patient refusing to answer most orientation questions, very agitated [place].AT, Normal respiratory effort on room air, CTAB RRR, no obvious edema +ve B.Sounds, Abd Soft, No tenderness, No rebound, guarding or rigidity. Bilateral lower extremities below knees wrapped in compression wraps   I have personally reviewed the following:   Data Reviewed:  CBC Recent Labs  Lab 12/21/19 2330 12/23/19 0507 12/24/19 0905  WBC 4.8 6.2 4.0  HGB 8.5* 8.5* 8.1*  HCT 29.2* 28.6* 27.7*  PLT 231 230 196  MCV 84.6 84.9 84.5  MCH 24.6* 25.2* 24.7*  MCHC 29.1* 29.7* 29.2*  RDW 17.0* 16.9* 16.9*  LYMPHSABS 1.2  --   --   MONOABS 0.5  --   --   EOSABS 0.3  --   --   BASOSABS 0.1  --   --  Chemistries  Recent Labs  Lab 12/21/19 2330 12/23/19 0507  NA 138 136  K 4.1 4.2  CL 103 100  CO2 27 25  GLUCOSE 110* 97  BUN 24* 18  CREATININE 1.25* 0.96  CALCIUM 8.8* 8.8*  AST 14* 16  ALT 11 10  ALKPHOS 88 90  BILITOT 0.5 0.9   ------------------------------------------------------------------------------------------------------------------ No results for input(s): CHOL, HDL, LDLCALC, TRIG, CHOLHDL, LDLDIRECT in the last 72 hours.  Lab Results  Component Value Date   HGBA1C 6.1 (H) 12/22/2019   ------------------------------------------------------------------------------------------------------------------ No results for input(s): TSH, T4TOTAL, T3FREE, THYROIDAB in the last 72 hours.  Invalid input(s): FREET3 ------------------------------------------------------------------------------------------------------------------ No results for input(s): VITAMINB12, FOLATE, FERRITIN, TIBC, IRON, RETICCTPCT in the last 72 hours.  Coagulation profile Recent Labs  Lab 12/23/19 0507  INR 1.2    No results for input(s): DDIMER in the last 72  hours.  Cardiac Enzymes No results for input(s): CKMB, TROPONINI, MYOGLOBIN in the last 168 hours.  Invalid input(s): CK ------------------------------------------------------------------------------------------------------------------    Component Value Date/Time   BNP 231.2 (H) 12/10/2017 1704    Micro Results Recent Results (from the past 240 hour(s))  Respiratory Panel by RT PCR (Flu A&B, Covid) - Nasopharyngeal Swab     Status: None   Collection Time: 12/22/19  6:49 AM   Specimen: Nasopharyngeal Swab  Result Value Ref Range Status   SARS Coronavirus 2 by RT PCR NEGATIVE NEGATIVE Final    Comment: (NOTE) SARS-CoV-2 target nucleic acids are NOT DETECTED. The SARS-CoV-2 RNA is generally detectable in upper respiratoy specimens during the acute phase of infection. The lowest concentration of SARS-CoV-2 viral copies this assay can detect is 131 copies/mL. A negative result does not preclude SARS-Cov-2 infection and should not be used as the sole basis for treatment or other patient management decisions. A negative result may occur with  improper specimen collection/handling, submission of specimen other than nasopharyngeal swab, presence of viral mutation(s) within the areas targeted by this assay, and inadequate number of viral copies (<131 copies/mL). A negative result must be combined with clinical observations, patient history, and epidemiological information. The expected result is Negative. Fact Sheet for Patients:  PinkCheek.be Fact Sheet for Healthcare Providers:  GravelBags.it This test is not yet ap proved or cleared by the [place] FDA and  has been authorized for detection and/or diagnosis of SARS-CoV-2 by FDA under an Emergency Use Authorization (EUA). This EUA will remain  in effect (meaning this test can be used) for the duration of the COVID-19 declaration under Section 564(b)(1) of the Act, 21  U.S.C. section 360bbb-3(b)(1), unless the authorization is terminated or revoked sooner.    Influenza A by PCR NEGATIVE NEGATIVE Final   Influenza B by PCR NEGATIVE NEGATIVE Final    Comment: (NOTE) The Xpert Xpress SARS-CoV-2/FLU/RSV assay is intended as an aid in  the diagnosis of influenza from Nasopharyngeal swab specimens and  should not be used as a sole basis for treatment. Nasal washings and  aspirates are unacceptable for Xpert Xpress SARS-CoV-2/FLU/RSV  testing. Fact Sheet for Patients: PinkCheek.be Fact Sheet for Healthcare Providers: GravelBags.it This test is not yet approved or cleared by the [place] FDA and  has been authorized for detection and/or diagnosis of SARS-CoV-2 by  FDA under an Emergency Use Authorization (EUA). This EUA will remain  in effect (meaning this test can be used) for the duration of the  Covid-19 declaration under Section 564(b)(1) of the Act, 21  U.S.C. section 360bbb-3(b)(1), unless the authorization is  terminated or revoked. Performed at [facility], [address], [place], [place] [address]   Aerobic/Anaerobic Culture (surgical/deep wound)     Status: None (Preliminary result)   Collection Time: 12/22/19  7:00 AM   Specimen: Wound  Result Value Ref Range Status   Specimen Description   Final    WOUND RT LOWER LEG Performed at [facility], [address], [place], [place] [address]    Special Requests   Final    Normal Performed at [facility], [address], [place], [place] [address]    Gram Stain   Final    FEW WBC PRESENT, PREDOMINANTLY PMN MODERATE GRAM NEGATIVE RODS    Culture   Final    ABUNDANT PSEUDOMONAS AERUGINOSA FEW PROTEUS MIRABILIS SUSCEPTIBILITIES TO FOLLOW Performed at [facility], [address], [place], [place] [address]    Report Status PENDING  Incomplete  Culture, blood  (routine x 2)     Status: None (Preliminary result)   Collection Time: 12/22/19  1:17 PM   Specimen: BLOOD RIGHT FOREARM  Result Value Ref Range Status   Specimen Description   Final    BLOOD RIGHT FOREARM Performed at [facility], [address], [place], [place] [address]    Special Requests   Final    BOTTLES DRAWN AEROBIC ONLY Blood Culture results may not be optimal due to an inadequate volume of blood received in culture bottles Performed at [facility], [address] [place], [place] [address]    Culture   Final    NO GROWTH 2 DAYS Performed at [facility], [address] [place], [place] [address]    Report Status PENDING  Incomplete  Culture, blood (routine x 2)     Status: None (Preliminary result)   Collection Time: 12/22/19  1:44 PM   Specimen: BLOOD LEFT FOREARM  Result Value Ref Range Status   Specimen Description   Final    BLOOD LEFT FOREARM Performed at [facility], [address] [place], [place] [address]    Special Requests   Final    BOTTLES DRAWN AEROBIC ONLY Blood Culture adequate volume Performed at [facility], [address] [place], [place] [address]    Culture   Final    NO GROWTH 2 DAYS Performed at [facility], [address], [place], [place] [address]    Report Status PENDING  Incomplete  MRSA PCR Screening     Status: Abnormal   Collection Time: 12/23/19 10:37 AM   Specimen: Nasopharyngeal  Result Value Ref Range Status   MRSA by PCR POSITIVE (A) NEGATIVE Final    Comment:        The GeneXpert MRSA Assay (FDA approved for NASAL specimens only), is one component of a comprehensive MRSA colonization surveillance program. It is not intended to diagnose MRSA infection nor to guide or monitor treatment for MRSA infections. RESULT CALLED TO, READ BACK BY AND VERIFIED WITH: D.BELFIELD AT 1438 ON 12/23/19 BY N.THOMPSON Performed at [facility], [address], [place], [place] [address]     Radiology Reports DG Tibia/Fibula Left  Result Date: 12/22/2019 CLINICAL DATA:  Severe pain with open wounds on the lower extremity EXAM: LEFT TIBIA AND FIBULA - 2 VIEW COMPARISON:  08/12/2019 FINDINGS: Generalized soft tissue swelling and subcutaneous reticulation. The patient's wounds are likely seen as excavation along the lateral leg. There  is no underlying fracture or erosion. No opaque foreign body or soft tissue emphysema. Osteopenia and osteoarthritis of the knee. IMPRESSION: Soft tissue swelling without acute osseous finding, opaque foreign body, or soft tissue emphysema. Electronically Signed   By: Marnee Spring M.D.   On: 12/22/2019 06:47   DG Tibia/Fibula Right  Result Date: 12/22/2019 CLINICAL DATA:  Anterior leg wound EXAM: RIGHT TIBIA AND FIBULA - 2 VIEW COMPARISON:  08/12/2019 FINDINGS: There is periosteal new bone formation along the anterolateral surface of the fibula. No osseous abnormality of the tibia. No fracture or dislocation. Large soft tissue wound anteriorly. IMPRESSION: Periosteal new bone formation along the anterolateral surface of the right fibula, compatible with chronic or acute-on-chronic osteomyelitis. Electronically Signed   By: Deatra Robinson M.D.   On: 12/22/2019 02:07   DG Foot 2 Views Left  Result Date: 12/22/2019 CLINICAL DATA:  Severe pain with open wounds the lower extremity EXAM: LEFT FOOT - 2 VIEW COMPARISON:  None. FINDINGS: Osteopenia.  No fracture, dislocation malalignment, or erosion. Skin thickening and excavations to the medial and lateral lower leg. No soft tissue emphysema or opaque foreign body. Hammertoe deformities on the lateral view.  Hallux valgus. IMPRESSION: Soft tissue swelling without acute osseous finding. Electronically Signed   By: Marnee Spring M.D.   On: 12/22/2019 06:48   DG Foot Complete Right  Result Date: 12/22/2019 CLINICAL DATA:  Soft tissue infection  EXAM: RIGHT FOOT COMPLETE - 3+ VIEW COMPARISON:  None. FINDINGS: There is no evidence of fracture or dislocation. No osteolysis of the bones of the foot. There is metatarsus primus varus and hallux valgus. IMPRESSION: No radiographic evidence of osteomyelitis of the foot. Electronically Signed   By: Deatra Robinson M.D.   On: 12/22/2019 02:08     Time Spent in minutes  30     Laverna Peace M.D on 12/24/2019 at 10:40 PM  To page go to [email or web address] - password [facility]

## 2019-12-24 NOTE — Progress Notes (Signed)
Pharmacy Antibiotic Note  [name] is a 69 y.o. female admitted on 12/21/2019 with increased swelling and redness of right LE ulcers.  Pharmacy has been consulted for vancomycin dosing.  Today, 12/24/2019:  D3 vanc/Rocephin  Remains afebrile, WBC stable wnl, SCr improved to baseline  Wound Cx growing Pseudomonas; MRSA PCR positive; d/w MD and will chg abx to Zosyn  Plan:  Zosyn 3.375 g IV q8 hr by extended infusion  Pharmacy will sign-off regular note-writing, but will follow for abx selection as patient refusing surgery so will likely need extended course of abx. Unclear what quality of wound culture was (technique not described) but definitive treatment would likely require a deep/surgical sample, possibly to include bone tissue. For now, agree with pseudomonal as well as broad gram negative and anaerobic coverage. Despite nasal colonization, would hold off on MRSA coverage for now given no staph growing in Cx.    Height: 5\' 6"  (167.6 cm) Weight: 80.7 kg (178 lb) IBW/kg (Calculated) : 59.3  Temp (24hrs), Avg:98.6 F (37 C), Min:98.1 F (36.7 C), Max:99 F (37.2 C)  Recent Labs  Lab 12/21/19 2324 12/21/19 2330 12/23/19 0507 12/24/19 0905  WBC  --  4.8 6.2 4.0  CREATININE  --  1.25* 0.96  --   LATICACIDVEN 0.7  --   --   --     Estimated Creatinine Clearance: 59.3 mL/min (by C-G formula based on SCr of 0.96 mg/dL).    Allergies  Allergen Reactions  . Codeine Other (See Comments)    "Gets high"  . Tylenol [Acetaminophen] Hives    Thank you for allowing pharmacy to be a part of this patient's care.  Nthony Lefferts A 12/24/2019 2:18 PM

## 2019-12-24 NOTE — Progress Notes (Signed)
Bilateral lower extremity wound dressing where changed around 1500. Patient was given pain medication but the process was still painful. Drainage is yellow, moderate in amount, thick, and purulent. Aquacel, Kerlex, and Coban was used for the dressing. Dressing due to be changed on Saturday.

## 2019-12-25 DIAGNOSIS — E1159 Type 2 diabetes mellitus with other circulatory complications: Secondary | ICD-10-CM | POA: Diagnosis not present

## 2019-12-25 DIAGNOSIS — M869 Osteomyelitis, unspecified: Secondary | ICD-10-CM | POA: Diagnosis not present

## 2019-12-25 DIAGNOSIS — M86661 Other chronic osteomyelitis, right tibia and fibula: Secondary | ICD-10-CM | POA: Diagnosis not present

## 2019-12-25 DIAGNOSIS — L03115 Cellulitis of right lower limb: Secondary | ICD-10-CM | POA: Diagnosis not present

## 2019-12-25 DIAGNOSIS — L03116 Cellulitis of left lower limb: Secondary | ICD-10-CM | POA: Diagnosis not present

## 2019-12-25 LAB — GLUCOSE, CAPILLARY
Glucose-Capillary: 135 mg/dL — ABNORMAL HIGH (ref 70–99)
Glucose-Capillary: 92 mg/dL (ref 70–99)
Glucose-Capillary: 118 mg/dL — ABNORMAL HIGH (ref 70–99)
Glucose-Capillary: 84 mg/dL (ref 70–99)

## 2019-12-25 LAB — AEROBIC/ANAEROBIC CULTURE W GRAM STAIN (SURGICAL/DEEP WOUND): Special Requests: NORMAL

## 2019-12-25 LAB — CBC
HCT: 27.8 % — ABNORMAL LOW (ref 36.0–46.0)
Hemoglobin: 8.1 g/dL — ABNORMAL LOW (ref 12.0–15.0)
MCH: 24.8 pg — ABNORMAL LOW (ref 26.0–34.0)
MCHC: 29.1 g/dL — ABNORMAL LOW (ref 30.0–36.0)
MCV: 85.3 fL (ref 80.0–100.0)
Platelets: 194 10*3/uL (ref 150–400)
RBC: 3.26 MIL/uL — ABNORMAL LOW (ref 3.87–5.11)
RDW: 17.1 % — ABNORMAL HIGH (ref 11.5–15.5)
WBC: 3.4 10*3/uL — ABNORMAL LOW (ref 4.0–10.5)
nRBC: 0 % (ref 0.0–0.2)

## 2019-12-25 LAB — BASIC METABOLIC PANEL
Anion gap: 8 (ref 5–15)
BUN: 19 mg/dL (ref 8–23)
CO2: 28 mmol/L (ref 22–32)
Calcium: 8.3 mg/dL — ABNORMAL LOW (ref 8.9–10.3)
Chloride: 103 mmol/L (ref 98–111)
Creatinine, Ser: 1.11 mg/dL — ABNORMAL HIGH (ref 0.44–1.00)
GFR calc Af Amer: 59 mL/min — ABNORMAL LOW (ref >60)
GFR calc non Af Amer: 51 mL/min — ABNORMAL LOW (ref >60)
Glucose, Bld: 96 mg/dL (ref 70–99)
Potassium: 3.5 mmol/L (ref 3.5–5.1)
Sodium: 139 mmol/L (ref 135–145)

## 2019-12-25 MED ORDER — DOXYCYCLINE HYCLATE 100 MG PO TABS
100.0000 mg | ORAL_TABLET | Freq: Two times a day (BID) | ORAL | Status: DC
  Administered 2019-12-25 – 2019-12-29 (×8): 100 mg via ORAL
  Filled 2019-12-25 (×9): qty 1

## 2019-12-25 MED ORDER — SODIUM CHLORIDE 0.9 % IV SOLN
1.0000 g | Freq: Three times a day (TID) | INTRAVENOUS | Status: DC
  Administered 2019-12-25 – 2019-12-28 (×8): 1 g via INTRAVENOUS
  Filled 2019-12-25 (×9): qty 1

## 2019-12-25 NOTE — Progress Notes (Signed)
[facility]  PROGRESS NOTE  [name]:[id number] DOB: [birth year] DOA: 12/21/2019 PCP: [name], DO Admit date - 12/21/2019   Admitting Physician Marinda Elk, MD  Outpatient Primary MD for the patient is [name], DO  LOS - 3 Brief Narrative   [name] is a 69 y.o. year old female with medical history significant for atrial fibrillation/flutter, bipolar disorder, type 2 diabetes, anemia chronic venous ulceration wounds of bilateral legs with history of declining compression wrap for treatment as well as surgery follow-up wound care center who presented on 12/21/2019 with worsening pain from ulcers of the bilateral legs at her facility.On presentation patient was afebrile, hemodynamically stable work-up notable for soft tissue swelling of left fibula on x-ray without signs of osteo and x-ray right fibula compatible with chronic or acute on chronic osteomyelitis.  Patient was started empirically on vancomycin and ceftriaxone  Hospital course complicated by patient declining much of medical treatment particularly compression stockings, seems to be okay with IV antibiotics, does not want to consider any surgical interventions  Subjective  Today seems more comfortable.  Denies any fevers or chills.  Denies any leg pain  A & P   Chronic biateral lower extremity wounds with nonpurulent cellulitis in setting of chronic venous insufficiency, X-ray of left tibia/fibula/foot shows generalized soft tissue swelling without osseous findings.  Assessed by orthopedics who notes improvement in appearance of these lesions from prior evaluation. On admission had drainage, foul smell, superficial swab of wound positive for abundant Pseudomonas, MRSA PCR positive however blood cultures unremarkable, no MRSA on skin cultures.  Discussed with on-call ID (Dr. Ilsa Iha), superficial skin swab not the best confirmation, but given concern for chronic osteomyelitis recommends  treatment -Have discontinued vancomycin -Ideal therapy would be IV meropenem for pseudomonal coverage with doxycycline for MRSA coverage for 2 weeks -If patient is not amenable to return to facility and going home instead would recommend oral regimen of doxycycline and Augmentin for 2 weeks -Patient does not want to consider any type of surgery, she does not want compression wraps, she will consider compression stockings -Plan outpatient follow-up with orthopedics, Dr. Aldean Baker after discharge to provide follow-up long-term care for ulcer/infection --PT eval, if amenable to SNF will need PICC  Chronic osteomyelitis of right fibula.  admitting x-ray shows periosteal scalloping of the fibula, consistent with chronic OM as reviewed by orthopedics.  Bipolar disorder.  Noted in chart.  Patient denies history of. -Psychiatry consulted, no imminent risk to self or others per their assessment, recommended Neurontin  for mood stabilization --patient already on neurontin 600 mg TID for neuropathy  Hypertension, at goal -Continue Lopressor 12.5 mg twice daily  Type 2 diabetes, controlled with peripheral neuropathy -Continue gabapentin -Sliding scale as needed, monitor CBGs  Normocytic anemia, chronic.  Hemoglobin stable at baseline.  No signs of bleeding. -Daily CBC    Family Communication  : None  Code Status : DNR  Disposition Plan  :  Patient is from [facility] anticipated d/c date: 24-48 hours. Barriers to d/c or necessity for inpatient status:  Currently on IV Meropenem for likely pseudomonal infection and now doxycycline. Need PT eval if patient willing to go to facility would need PICC to continue meropenem for 2 weeks, if not can d/c home with augmentin and doxycycline for 2 weeks. Consults  : Orthopedics  Procedures  : None  DVT Prophylaxis  :  Lovenox   Lab Results  Component Value Date   PLT  194 12/25/2019    Diet :  Diet Order            Diet heart healthy/carb  modified Room service appropriate? Yes; Fluid consistency: Thin  Diet effective now               Inpatient Medications Scheduled Meds: . doxycycline  100 mg Oral Q12H  . DULoxetine  60 mg Oral Daily  . enoxaparin (LOVENOX) injection  40 mg Subcutaneous Q24H  . feeding supplement (PRO-STAT SUGAR FREE 64)  30 mL Oral TID  . gabapentin  600 mg Oral TID  . insulin aspart  0-15 Units Subcutaneous TID AC & HS  . metoprolol tartrate  12.5 mg Oral BID  . multivitamin  1 tablet Oral Daily  . nutrition supplement (JUVEN)  1 packet Oral BID BM  . Ensure Max Protein  11 oz Oral Daily  . sodium chloride flush  10-40 mL Intracatheter Q12H  . sodium chloride flush  3 mL Intravenous Once   Continuous Infusions: . meropenem (MERREM) IV 1 g (12/25/19 2111)   PRN Meds:.albuterol, hydrOXYzine, morphine injection **OR** morphine injection, sodium chloride flush  Antibiotics  :   Anti-infectives (From admission, onward)   Start     Dose/Rate Route Frequency Ordered Stop   12/25/19 2000  meropenem (MERREM) 1 g in sodium chloride 0.9 % 100 mL IVPB     1 g 200 mL/hr over 30 Minutes Intravenous Every 8 hours 12/25/19 1542     12/25/19 1800  doxycycline (VIBRA-TABS) tablet 100 mg     100 mg Oral Every 12 hours 12/25/19 1535     12/24/19 1500  piperacillin-tazobactam (ZOSYN) IVPB 3.375 g  Status:  Discontinued     3.375 g 12.5 mL/hr over 240 Minutes Intravenous Every 8 hours 12/24/19 1418 12/25/19 1534   12/23/19 0700  vancomycin (VANCOREADY) IVPB 750 mg/150 mL  Status:  Discontinued     750 mg 150 mL/hr over 60 Minutes Intravenous Every 24 hours 12/22/19 0544 12/24/19 1307   12/22/19 0700  vancomycin (VANCOREADY) IVPB 1500 mg/300 mL     1,500 mg 150 mL/hr over 120 Minutes Intravenous  Once 12/22/19 0544 12/22/19 1239   12/22/19 0600  cefTRIAXone (ROCEPHIN) 2 g in sodium chloride 0.9 % 100 mL IVPB  Status:  Discontinued     2 g 200 mL/hr over 30 Minutes Intravenous Every 24 hours 12/22/19 0535  12/24/19 1307   12/22/19 0130  ceFAZolin (ANCEF) IVPB 1 g/50 mL premix     1 g 100 mL/hr over 30 Minutes Intravenous  Once 12/22/19 0129 12/22/19 0235       Objective   Vitals:   12/24/19 2152 12/25/19 0530 12/25/19 0955 12/25/19 1339  BP: 137/89 123/68 120/70 116/73  Pulse: 81 81 84 84  Resp: 18 17  16   Temp: 98.3 F (36.8 C) 98.2 F (36.8 C)  98.7 F (37.1 C)  TempSrc: Oral Oral  Oral  SpO2: 95% 93%  94%  Weight:      Height:        SpO2: 94 %  Wt Readings from Last 3 Encounters:  12/21/19 80.7 kg  08/13/19 75 kg  06/28/19 77.7 kg     Intake/Output Summary (Last 24 hours) at 12/25/2019 2141 Last data filed at 12/25/2019 1700 Gross per 24 hour  Intake 600 ml  Output 400 ml  Net 200 ml    Physical Exam:     Awake Alert, Oriented X self, place, patient refusing to answer  most orientation questions, very agitated [place].AT, Normal respiratory effort on room air, CTAB RRR, no obvious edema +ve B.Sounds, Abd Soft, No tenderness, No rebound, guarding or rigidity. Bilateral lower extremities below knees wrapped in compression wraps   I have personally reviewed the following:   Data Reviewed:  CBC Recent Labs  Lab 12/21/19 2330 12/23/19 0507 12/24/19 0905 12/25/19 0509  WBC 4.8 6.2 4.0 3.4*  HGB 8.5* 8.5* 8.1* 8.1*  HCT 29.2* 28.6* 27.7* 27.8*  PLT 231 230 196 194  MCV 84.6 84.9 84.5 85.3  MCH 24.6* 25.2* 24.7* 24.8*  MCHC 29.1* 29.7* 29.2* 29.1*  RDW 17.0* 16.9* 16.9* 17.1*  LYMPHSABS 1.2  --   --   --   MONOABS 0.5  --   --   --   EOSABS 0.3  --   --   --   BASOSABS 0.1  --   --   --     Chemistries  Recent Labs  Lab 12/21/19 2330 12/23/19 0507 12/25/19 0509  NA 138 136 139  K 4.1 4.2 3.5  CL 103 100 103  CO2 GLUCOSE 110* 97 96  BUN 24* 18 19  CREATININE 1.25* 0.96 1.11*  CALCIUM 8.8* 8.8* 8.3*  AST 14* 16  --   ALT 11 10  --   ALKPHOS 88 90  --   BILITOT 0.5 0.9  --     ------------------------------------------------------------------------------------------------------------------ No results for input(s): CHOL, HDL, LDLCALC, TRIG, CHOLHDL, LDLDIRECT in the last 72 hours.  Lab Results  Component Value Date   HGBA1C 6.1 (H) 12/22/2019   ------------------------------------------------------------------------------------------------------------------ No results for input(s): TSH, T4TOTAL, T3FREE, THYROIDAB in the last 72 hours.  Invalid input(s): FREET3 ------------------------------------------------------------------------------------------------------------------ No results for input(s): VITAMINB12, FOLATE, FERRITIN, TIBC, IRON, RETICCTPCT in the last 72 hours.  Coagulation profile Recent Labs  Lab 12/23/19 0507  INR 1.2    No results for input(s): DDIMER in the last 72 hours.  Cardiac Enzymes No results for input(s): CKMB, TROPONINI, MYOGLOBIN in the last 168 hours.  Invalid input(s): CK ------------------------------------------------------------------------------------------------------------------    Component Value Date/Time   BNP 231.2 (H) 12/10/2017 1704    Micro Results Recent Results (from the past 240 hour(s))  Respiratory Panel by RT PCR (Flu A&B, Covid) - Nasopharyngeal Swab     Status: None   Collection Time: 12/22/19  6:49 AM   Specimen: Nasopharyngeal Swab  Result Value Ref Range Status   SARS Coronavirus 2 by RT PCR NEGATIVE NEGATIVE Final    Comment: (NOTE) SARS-CoV-2 target nucleic acids are NOT DETECTED. The SARS-CoV-2 RNA is generally detectable in upper respiratoy specimens during the acute phase of infection. The lowest concentration of SARS-CoV-2 viral copies this assay can detect is 131 copies/mL. A negative result does not preclude SARS-Cov-2 infection and should not be used as the sole basis for treatment or other patient management decisions. A negative result may occur with  improper specimen  collection/handling, submission of specimen other than nasopharyngeal swab, presence of viral mutation(s) within the areas targeted by this assay, and inadequate number of viral copies (<131 copies/mL). A negative result must be combined with clinical observations, patient history, and epidemiological information. The expected result is Negative. Fact Sheet for Patients:  https://www.moore.com/ Fact Sheet for Healthcare Providers:  https://www.young.biz/ This test is not yet ap proved or cleared by the [place] FDA and  has been authorized for detection and/or diagnosis of SARS-CoV-2 by FDA under an Emergency Use Authorization (EUA). This EUA will remain  in effect (meaning this test can be used) for the duration of the COVID-19 declaration under Section 564(b)(1) of the Act, 21 U.S.C. section 360bbb-3(b)(1), unless the authorization is terminated or revoked sooner.    Influenza A by PCR NEGATIVE NEGATIVE Final   Influenza B by PCR NEGATIVE NEGATIVE Final    Comment: (NOTE) The Xpert Xpress SARS-CoV-2/FLU/RSV assay is intended as an aid in  the diagnosis of influenza from Nasopharyngeal swab specimens and  should not be used as a sole basis for treatment. Nasal washings and  aspirates are unacceptable for Xpert Xpress SARS-CoV-2/FLU/RSV  testing. Fact Sheet for Patients: https://www.moore.com/ Fact Sheet for Healthcare Providers: https://www.young.biz/ This test is not yet approved or cleared by the [place] FDA and  has been authorized for detection and/or diagnosis of SARS-CoV-2 by  FDA under an Emergency Use Authorization (EUA). This EUA will remain  in effect (meaning this test can be used) for the duration of the  Covid-19 declaration under Section 564(b)(1) of the Act, 21  U.S.C. section 360bbb-3(b)(1), unless the authorization is  terminated or revoked. Performed at [facility], [address], [place], [place] [address]   Aerobic/Anaerobic Culture (surgical/deep wound)     Status: None   Collection Time: 12/22/19  7:00 AM   Specimen: Wound  Result Value Ref Range Status   Specimen Description   Final    WOUND RT LOWER LEG Performed at [facility], [address] [place], [place] [address]    Special Requests   Final    Normal Performed at [facility], [address] [place], [place] [address]    Gram Stain   Final    FEW WBC PRESENT, PREDOMINANTLY PMN MODERATE GRAM NEGATIVE RODS    Culture   Final    ABUNDANT PSEUDOMONAS AERUGINOSA FEW PROTEUS MIRABILIS FEW STREPTOCOCCUS GROUP G ABUNDANT BACTEROIDES FRAGILIS BETA LACTAMASE POSITIVE Performed at [facility], [address] [place], [place] [address]    Report Status 12/25/2019 FINAL  Final   Organism ID, Bacteria PSEUDOMONAS AERUGINOSA  Final   Organism ID, Bacteria PROTEUS MIRABILIS  Final      Susceptibility   Pseudomonas aeruginosa - MIC*    CEFTAZIDIME 2 SENSITIVE Sensitive     CIPROFLOXACIN 2 INTERMEDIATE Intermediate     GENTAMICIN <=1 SENSITIVE Sensitive     IMIPENEM 2 SENSITIVE Sensitive     CEFEPIME <=1 SENSITIVE Sensitive     PIP/TAZO 32 SENSITIVE Sensitive     * ABUNDANT PSEUDOMONAS AERUGINOSA   Proteus mirabilis - MIC*    AMPICILLIN 4 SENSITIVE Sensitive     CEFAZOLIN <=4 SENSITIVE Sensitive     CEFEPIME <=1 SENSITIVE Sensitive     CEFTAZIDIME <=1 SENSITIVE Sensitive     CEFTRIAXONE <=1 SENSITIVE Sensitive     CIPROFLOXACIN >=4 RESISTANT Resistant     GENTAMICIN >=16 RESISTANT Resistant     IMIPENEM 2 SENSITIVE Sensitive     TRIMETH/SULFA >=320 RESISTANT Resistant     AMPICILLIN/SULBACTAM 4 SENSITIVE Sensitive     PIP/TAZO <=4 SENSITIVE Sensitive     * FEW PROTEUS MIRABILIS  Culture, blood (routine x 2)     Status: None (Preliminary result)   Collection Time: 12/22/19  1:17 PM   Specimen: BLOOD RIGHT FOREARM    Result Value Ref Range Status   Specimen Description   Final    BLOOD RIGHT FOREARM Performed at [facility], [address], [place], [place] [address]  Special Requests   Final    BOTTLES DRAWN AEROBIC ONLY Blood Culture results may not be optimal due to an inadequate volume of blood received in culture bottles Performed at [facility], [address] [place], [place] [address]    Culture   Final    NO GROWTH 3 DAYS Performed at [facility], [address] [place], [place] [address]    Report Status PENDING  Incomplete  Culture, blood (routine x 2)     Status: None (Preliminary result)   Collection Time: 12/22/19  1:44 PM   Specimen: BLOOD LEFT FOREARM  Result Value Ref Range Status   Specimen Description   Final    BLOOD LEFT FOREARM Performed at [facility], [address], [place], [place] [address]    Special Requests   Final    BOTTLES DRAWN AEROBIC ONLY Blood Culture adequate volume Performed at [facility], [address], [place], [place] [address]    Culture   Final    NO GROWTH 3 DAYS Performed at [facility], [address] [place], [place] [address]    Report Status PENDING  Incomplete  MRSA PCR Screening     Status: Abnormal   Collection Time: 12/23/19 10:37 AM   Specimen: Nasopharyngeal  Result Value Ref Range Status   MRSA by PCR POSITIVE (A) NEGATIVE Final    Comment:        The GeneXpert MRSA Assay (FDA approved for NASAL specimens only), is one component of a comprehensive MRSA colonization surveillance program. It is not intended to diagnose MRSA infection nor to guide or monitor treatment for MRSA infections. RESULT CALLED TO, READ BACK BY AND VERIFIED WITH: D.BELFIELD AT 1438 ON 12/23/19 BY N.THOMPSON Performed at [facility], [address] [place], [place] [address]     Radiology Reports DG Tibia/Fibula Left  Result  Date: 12/22/2019 CLINICAL DATA:  Severe pain with open wounds on the lower extremity EXAM: LEFT TIBIA AND FIBULA - 2 VIEW COMPARISON:  08/12/2019 FINDINGS: Generalized soft tissue swelling and subcutaneous reticulation. The patient's wounds are likely seen as excavation along the lateral leg. There is no underlying fracture or erosion. No opaque foreign body or soft tissue emphysema. Osteopenia and osteoarthritis of the knee. IMPRESSION: Soft tissue swelling without acute osseous finding, opaque foreign body, or soft tissue emphysema. Electronically Signed   By: Marnee Spring M.D.   On: 12/22/2019 06:47   DG Tibia/Fibula Right  Result Date: 12/22/2019 CLINICAL DATA:  Anterior leg wound EXAM: RIGHT TIBIA AND FIBULA - 2 VIEW COMPARISON:  08/12/2019 FINDINGS: There is periosteal new bone formation along the anterolateral surface of the fibula. No osseous abnormality of the tibia. No fracture or dislocation. Large soft tissue wound anteriorly. IMPRESSION: Periosteal new bone formation along the anterolateral surface of the right fibula, compatible with chronic or acute-on-chronic osteomyelitis. Electronically Signed   By: Deatra Robinson M.D.   On: 12/22/2019 02:07   DG Foot 2 Views Left  Result Date: 12/22/2019 CLINICAL DATA:  Severe pain with open wounds the lower extremity EXAM: LEFT FOOT - 2 VIEW COMPARISON:  None. FINDINGS: Osteopenia.  No fracture, dislocation malalignment, or erosion. Skin thickening and excavations to the medial and lateral lower leg. No soft tissue emphysema or opaque foreign body. Hammertoe deformities on the lateral view.  Hallux valgus. IMPRESSION: Soft tissue swelling without acute osseous finding. Electronically Signed   By: Kathrynn Ducking.D.  On: 12/22/2019 06:48   DG Foot Complete Right  Result Date: 12/22/2019 CLINICAL DATA:  Soft tissue infection EXAM: RIGHT FOOT COMPLETE - 3+ VIEW COMPARISON:  None. FINDINGS: There is no evidence of fracture or dislocation. No  osteolysis of the bones of the foot. There is metatarsus primus varus and hallux valgus. IMPRESSION: No radiographic evidence of osteomyelitis of the foot. Electronically Signed   By: Ulyses Jarred M.D.   On: 12/22/2019 02:08     Time Spent in minutes  30     Desiree Hane M.D on 12/25/2019 at 9:41 PM  To page go to [email or web address] - password [facility]

## 2019-12-25 NOTE — Progress Notes (Addendum)
Pharmacy - Antibiotic Note (Brief note)  See pharmacist's full note from earlier today for piperacillin/tazobactam Following hospitalist discussion with ID, zosyn changing to meropenem + doxycyline  Plan:  Meropenem 1gm IV q8h - pharmacy will follow at distance  Doxycyline 100mg  PO BID (PO with excellent bioavailability)  Plan at discharge will be"  If goes to facility, meropenem + PO Doxy x 2 weeks  If goes home, doxycyline + amoxicillin/clavulonate x 2 weeks  , PharmD, BCPS.   Work Cell: [phone] 12/25/2019 3:40 PM

## 2019-12-26 DIAGNOSIS — E1142 Type 2 diabetes mellitus with diabetic polyneuropathy: Secondary | ICD-10-CM | POA: Diagnosis not present

## 2019-12-26 DIAGNOSIS — B965 Pseudomonas (aeruginosa) (mallei) (pseudomallei) as the cause of diseases classified elsewhere: Secondary | ICD-10-CM | POA: Diagnosis not present

## 2019-12-26 DIAGNOSIS — I872 Venous insufficiency (chronic) (peripheral): Secondary | ICD-10-CM | POA: Diagnosis not present

## 2019-12-26 DIAGNOSIS — Z79899 Other long term (current) drug therapy: Secondary | ICD-10-CM | POA: Diagnosis not present

## 2019-12-26 DIAGNOSIS — Z90710 Acquired absence of both cervix and uterus: Secondary | ICD-10-CM | POA: Diagnosis not present

## 2019-12-26 DIAGNOSIS — L97919 Non-pressure chronic ulcer of unspecified part of right lower leg with unspecified severity: Secondary | ICD-10-CM | POA: Diagnosis not present

## 2019-12-26 DIAGNOSIS — I87313 Chronic venous hypertension (idiopathic) with ulcer of bilateral lower extremity: Secondary | ICD-10-CM | POA: Diagnosis not present

## 2019-12-26 DIAGNOSIS — L03116 Cellulitis of left lower limb: Secondary | ICD-10-CM | POA: Diagnosis not present

## 2019-12-26 DIAGNOSIS — D649 Anemia, unspecified: Secondary | ICD-10-CM | POA: Diagnosis not present

## 2019-12-26 DIAGNOSIS — I4891 Unspecified atrial fibrillation: Secondary | ICD-10-CM | POA: Diagnosis not present

## 2019-12-26 DIAGNOSIS — L03115 Cellulitis of right lower limb: Secondary | ICD-10-CM | POA: Diagnosis not present

## 2019-12-26 DIAGNOSIS — F319 Bipolar disorder, unspecified: Secondary | ICD-10-CM | POA: Diagnosis not present

## 2019-12-26 DIAGNOSIS — F17210 Nicotine dependence, cigarettes, uncomplicated: Secondary | ICD-10-CM | POA: Diagnosis not present

## 2019-12-26 DIAGNOSIS — L97929 Non-pressure chronic ulcer of unspecified part of left lower leg with unspecified severity: Secondary | ICD-10-CM | POA: Diagnosis not present

## 2019-12-26 DIAGNOSIS — G894 Chronic pain syndrome: Secondary | ICD-10-CM | POA: Diagnosis not present

## 2019-12-26 DIAGNOSIS — E1159 Type 2 diabetes mellitus with other circulatory complications: Secondary | ICD-10-CM | POA: Diagnosis not present

## 2019-12-26 DIAGNOSIS — G14 Postpolio syndrome: Secondary | ICD-10-CM | POA: Diagnosis not present

## 2019-12-26 DIAGNOSIS — E1169 Type 2 diabetes mellitus with other specified complication: Secondary | ICD-10-CM | POA: Diagnosis not present

## 2019-12-26 DIAGNOSIS — Z791 Long term (current) use of non-steroidal anti-inflammatories (NSAID): Secondary | ICD-10-CM | POA: Diagnosis not present

## 2019-12-26 DIAGNOSIS — M869 Osteomyelitis, unspecified: Secondary | ICD-10-CM | POA: Diagnosis present

## 2019-12-26 DIAGNOSIS — I10 Essential (primary) hypertension: Secondary | ICD-10-CM | POA: Diagnosis not present

## 2019-12-26 DIAGNOSIS — M86661 Other chronic osteomyelitis, right tibia and fibula: Secondary | ICD-10-CM | POA: Diagnosis not present

## 2019-12-26 DIAGNOSIS — Z20822 Contact with and (suspected) exposure to covid-19: Secondary | ICD-10-CM | POA: Diagnosis not present

## 2019-12-26 DIAGNOSIS — Z66 Do not resuscitate: Secondary | ICD-10-CM | POA: Diagnosis not present

## 2019-12-26 LAB — CBC
HCT: 30.9 % — ABNORMAL LOW (ref 36.0–46.0)
Hemoglobin: 8.8 g/dL — ABNORMAL LOW (ref 12.0–15.0)
MCH: 24.3 pg — ABNORMAL LOW (ref 26.0–34.0)
MCHC: 28.5 g/dL — ABNORMAL LOW (ref 30.0–36.0)
MCV: 85.4 fL (ref 80.0–100.0)
Platelets: 223 10*3/uL (ref 150–400)
RBC: 3.62 MIL/uL — ABNORMAL LOW (ref 3.87–5.11)
RDW: 17.3 % — ABNORMAL HIGH (ref 11.5–15.5)
WBC: 3.6 10*3/uL — ABNORMAL LOW (ref 4.0–10.5)
nRBC: 0 % (ref 0.0–0.2)

## 2019-12-26 LAB — GLUCOSE, CAPILLARY
Glucose-Capillary: 86 mg/dL (ref 70–99)
Glucose-Capillary: 133 mg/dL — ABNORMAL HIGH (ref 70–99)
Glucose-Capillary: 111 mg/dL — ABNORMAL HIGH (ref 70–99)
Glucose-Capillary: 103 mg/dL — ABNORMAL HIGH (ref 70–99)

## 2019-12-26 NOTE — Progress Notes (Signed)
PROGRESS NOTE    [name]  URK:[id number] DOB: 1950/11/05 DOA: 12/21/2019 PCP: Cleophas Dunker, DO    Brief Narrative:  69 y.o. year old female with medical history significant for atrial fibrillation/flutter, bipolar disorder, type 2 diabetes, anemia chronic venous ulceration wounds of bilateral legs with history of declining compression wrap for treatment as well as surgery follow-up wound care center who presented on 12/21/2019 with worsening pain from ulcers of the bilateral legs at her facility.On presentation patient was afebrile, hemodynamically stable work-up notable for soft tissue swelling of left fibula on x-ray without signs of osteo and x-ray right fibula compatible with chronic or acute on chronic osteomyelitis.  Patient was started empirically on vancomycin and ceftriaxone  Hospital course complicated by patient declining much of medical treatment particularly compression stockings, seems to be okay with IV antibiotics, does not want to consider any surgical interventions   Assessment & Plan:   Principal Problem:   Bilateral cellulitis of lower leg Active Problems:   Atrial fibrillation (HCC)   Venous stasis ulcers of both lower extremities ([place])   Controlled type 2 diabetes mellitus with circulatory disorder, without long-term current use of insulin (HCC)   Osteomyelitis of right fibula (HCC)   Pseudomonas aeruginosa infection  #Chronic biateral lower extremity wounds with nonpurulent cellulitis in setting of chronic venous insufficiency, X-ray of left tibia/fibula/foot shows generalized soft tissue swelling without osseous findings.  Assessed by orthopedics who notes improvement in appearance of these lesions from prior evaluation. On admission had drainage, foul smell, superficial swab of wound positive for abundant Pseudomonas, MRSA PCR positive however blood cultures unremarkable, no MRSA on skin cultures.  Discussed with on-call ID (Dr. Graylon Good), superficial skin  swab not the best confirmation, but given concern for chronic osteomyelitis recommends treatment -Have discontinued vancomycin -Ideal therapy would be IV meropenem for pseudomonal coverage with doxycycline for MRSA coverage for 2 weeks -If patient is not amenable to return to facility and going home instead would recommend oral regimen of doxycycline and Augmentin for 2 weeks -Patient does not want to consider any type of surgery, she does not want compression wraps, she will consider compression stockings -Plan outpatient follow-up with orthopedics, Dr. Meridee Score after discharge to provide follow-up long-term care for ulcer/infection --PT eval, if amenable to SNF will need PICC  #Bipolar disorder.  Noted in chart.  Patient denies history of. -Psychiatry consulted, no imminent risk to self or others per their assessment, recommended Neurontin  for mood stabilization --patient already on neurontin 600 mg TID for neuropathy  #Hypertension, at goal -Continue Lopressor 12.5 mg twice daily  #Type 2 diabetes, controlled with peripheral neuropathy -Continue gabapentin -Sliding scale as needed, monitor CBGs  #Normocytic anemia, chronic.  Hemoglobin stable at baseline.  No signs of bleeding. -Daily CBC  DVT prophylaxis: Lovenox   code Status: DNR Family Communication: None   Disposition Plan:  Status is: Inpatient  Remains inpatient appropriate because:Inpatient level of care appropriate due to severity of illness   Dispo: The patient is from: SNF              Anticipated d/c is to: SNF              Anticipated d/c date is: 3 days              Patient currently is not medically stable to d/c.          Consultants:   Orthopedics   Procedures: None   Antimicrobials: Doxycycline and  meropenem.    Subjective: Patient extremely agitated.  No new complaints.  Objective: Vitals:   12/25/19 2246 12/26/19 0642 12/26/19 1037 12/26/19 1326  BP: 97/70 118/75 113/63  109/62  Pulse: 87 85 65 67  Resp: 17 17 16 16   Temp: 97.9 F (36.6 C) 99 F (37.2 C)  97.9 F (36.6 C)  TempSrc:    Oral  SpO2: 94% 95% 96% 97%  Weight:      Height:        Intake/Output Summary (Last 24 hours) at 12/26/2019 1823 Last data filed at 12/26/2019 1738 Gross per 24 hour  Intake 100 ml  Output 300 ml  Net -200 ml   Filed Weights   12/21/19 2210  Weight: 80.7 kg    Examination:  General exam: Appears calm and comfortable  Respiratory system: Clear to auscultation. Respiratory effort normal. Cardiovascular system: S1 & S2 heard, RRR. No JVD, murmurs, rubs, gallops or clicks. No pedal edema. Gastrointestinal system: Abdomen is nondistended, soft and nontender. No organomegaly or masses felt. Normal bowel sounds heard. Central nervous system: Alert and oriented. No focal neurological deficits. Extremities: Symmetric 5 x 5 power.  Both extremities wrapped in bandages therefore unable to visualize wounds. Skin: No rashes, lesions or ulcers Psychiatry: Judgement and insight appear normal. Mood & affect appropriate.     Data Reviewed: I have personally reviewed following labs and imaging studies  CBC: Recent Labs  Lab 12/21/19 2330 12/23/19 0507 12/24/19 0905 12/25/19 0509 12/26/19 0606  WBC 4.8 6.2 4.0 3.4* 3.6*  NEUTROABS 2.8  --   --   --   --   HGB 8.5* 8.5* 8.1* 8.1* 8.8*  HCT 29.2* 28.6* 27.7* 27.8* 30.9*  MCV 84.6 84.9 84.5 85.3 85.4  PLT 231 230 196 194 223   Basic Metabolic Panel: Recent Labs  Lab 12/21/19 2330 12/23/19 0507 12/25/19 0509  NA 138 136 139  K 4.1 4.2 3.5  CL 103 100 103  CO2 27 25 28   GLUCOSE 110* 97 96  BUN 24* 18 19  CREATININE 1.25* 0.96 1.11*  CALCIUM 8.8* 8.8* 8.3*   GFR: Estimated Creatinine Clearance: 51.3 mL/min (A) (by C-G formula based on SCr of 1.11 mg/dL (H)). Liver Function Tests: Recent Labs  Lab 12/21/19 2330 12/23/19 0507  AST 14* 16  ALT 11 10  ALKPHOS 88 90  BILITOT 0.5 0.9  PROT 7.5 7.3    ALBUMIN 3.0* 2.9*   No results for input(s): LIPASE, AMYLASE in the last 168 hours. No results for input(s): AMMONIA in the last 168 hours. Coagulation Profile: Recent Labs  Lab 12/23/19 0507  INR 1.2   Cardiac Enzymes: No results for input(s): CKTOTAL, CKMB, CKMBINDEX, TROPONINI in the last 168 hours. BNP (last 3 results) No results for input(s): PROBNP in the last 8760 hours. HbA1C: No results for input(s): HGBA1C in the last 72 hours. CBG: Recent Labs  Lab 12/25/19 1630 12/25/19 2242 12/26/19 0724 12/26/19 1259 12/26/19 1703  GLUCAP 118* 84 86 133* 111*   Lipid Profile: No results for input(s): CHOL, HDL, LDLCALC, TRIG, CHOLHDL, LDLDIRECT in the last 72 hours. Thyroid Function Tests: No results for input(s): TSH, T4TOTAL, FREET4, T3FREE, THYROIDAB in the last 72 hours. Anemia Panel: No results for input(s): VITAMINB12, FOLATE, FERRITIN, TIBC, IRON, RETICCTPCT in the last 72 hours. Sepsis Labs: Recent Labs  Lab 12/21/19 2324  LATICACIDVEN 0.7    Recent Results (from the past 240 hour(s))  Respiratory Panel by RT PCR (Flu A&B, Covid) - Nasopharyngeal  Swab     Status: None   Collection Time: 12/22/19  6:49 AM   Specimen: Nasopharyngeal Swab  Result Value Ref Range Status   SARS Coronavirus 2 by RT PCR NEGATIVE NEGATIVE Final    Comment: (NOTE) SARS-CoV-2 target nucleic acids are NOT DETECTED. The SARS-CoV-2 RNA is generally detectable in upper respiratoy specimens during the acute phase of infection. The lowest concentration of SARS-CoV-2 viral copies this assay can detect is 131 copies/mL. A negative result does not preclude SARS-Cov-2 infection and should not be used as the sole basis for treatment or other patient management decisions. A negative result may occur with  improper specimen collection/handling, submission of specimen other than nasopharyngeal swab, presence of viral mutation(s) within the areas targeted by this assay, and inadequate number of  viral copies (<131 copies/mL). A negative result must be combined with clinical observations, patient history, and epidemiological information. The expected result is Negative. Fact Sheet for Patients:  https://www.moore.com/https://www.fda.gov/media/142436/download Fact Sheet for Healthcare Providers:  https://www.young.biz/https://www.fda.gov/media/142435/download This test is not yet ap proved or cleared by the [place] FDA and  has been authorized for detection and/or diagnosis of SARS-CoV-2 by FDA under an Emergency Use Authorization (EUA). This EUA will remain  in effect (meaning this test can be used) for the duration of the COVID-19 declaration under Section 564(b)(1) of the Act, 21 U.S.C. section 360bbb-3(b)(1), unless the authorization is terminated or revoked sooner.    Influenza A by PCR NEGATIVE NEGATIVE Final   Influenza B by PCR NEGATIVE NEGATIVE Final    Comment: (NOTE) The Xpert Xpress SARS-CoV-2/FLU/RSV assay is intended as an aid in  the diagnosis of influenza from Nasopharyngeal swab specimens and  should not be used as a sole basis for treatment. Nasal washings and  aspirates are unacceptable for Xpert Xpress SARS-CoV-2/FLU/RSV  testing. Fact Sheet for Patients: https://www.moore.com/https://www.fda.gov/media/142436/download Fact Sheet for Healthcare Providers: https://www.young.biz/https://www.fda.gov/media/142435/download This test is not yet approved or cleared by the [place] FDA and  has been authorized for detection and/or diagnosis of SARS-CoV-2 by  FDA under an Emergency Use Authorization (EUA). This EUA will remain  in effect (meaning this test can be used) for the duration of the  Covid-19 declaration under Section 564(b)(1) of the Act, 21  U.S.C. section 360bbb-3(b)(1), unless the authorization is  terminated or revoked. Performed at [facility], [address] [place], [place] [address]   Aerobic/Anaerobic Culture (surgical/deep wound)     Status: None   Collection Time: 12/22/19  7:00 AM   Specimen:  Wound  Result Value Ref Range Status   Specimen Description   Final    WOUND RT LOWER LEG Performed at [facility], [address] [place], [place] [address]    Special Requests   Final    Normal Performed at [facility], [address] [place], [place] [address]    Gram Stain   Final    FEW WBC PRESENT, PREDOMINANTLY PMN MODERATE GRAM NEGATIVE RODS    Culture   Final    ABUNDANT PSEUDOMONAS AERUGINOSA FEW PROTEUS MIRABILIS FEW STREPTOCOCCUS GROUP G ABUNDANT BACTEROIDES FRAGILIS BETA LACTAMASE POSITIVE Performed at [facility], [address] [place], [place] [address]    Report Status 12/25/2019 FINAL  Final   Organism ID, Bacteria PSEUDOMONAS AERUGINOSA  Final   Organism ID, Bacteria PROTEUS MIRABILIS  Final      Susceptibility   Pseudomonas aeruginosa - MIC*    CEFTAZIDIME 2 SENSITIVE Sensitive  CIPROFLOXACIN 2 INTERMEDIATE Intermediate     GENTAMICIN <=1 SENSITIVE Sensitive     IMIPENEM 2 SENSITIVE Sensitive     CEFEPIME <=1 SENSITIVE Sensitive     PIP/TAZO 32 SENSITIVE Sensitive     * ABUNDANT PSEUDOMONAS AERUGINOSA   Proteus mirabilis - MIC*    AMPICILLIN 4 SENSITIVE Sensitive     CEFAZOLIN <=4 SENSITIVE Sensitive     CEFEPIME <=1 SENSITIVE Sensitive     CEFTAZIDIME <=1 SENSITIVE Sensitive     CEFTRIAXONE <=1 SENSITIVE Sensitive     CIPROFLOXACIN >=4 RESISTANT Resistant     GENTAMICIN >=16 RESISTANT Resistant     IMIPENEM 2 SENSITIVE Sensitive     TRIMETH/SULFA >=320 RESISTANT Resistant     AMPICILLIN/SULBACTAM 4 SENSITIVE Sensitive     PIP/TAZO <=4 SENSITIVE Sensitive     * FEW PROTEUS MIRABILIS  Culture, blood (routine x 2)     Status: None (Preliminary result)   Collection Time: 12/22/19  1:17 PM   Specimen: BLOOD RIGHT FOREARM  Result Value Ref Range Status   Specimen Description   Final    BLOOD RIGHT FOREARM Performed at [facility], [address], [place], [place] [address]     Special Requests   Final    BOTTLES DRAWN AEROBIC ONLY Blood Culture results may not be optimal due to an inadequate volume of blood received in culture bottles Performed at [facility], [address] [place], [place] [address]    Culture   Final    NO GROWTH 4 DAYS Performed at [facility], [address] [place], [place] [address]    Report Status PENDING  Incomplete  Culture, blood (routine x 2)     Status: None (Preliminary result)   Collection Time: 12/22/19  1:44 PM   Specimen: BLOOD LEFT FOREARM  Result Value Ref Range Status   Specimen Description   Final    BLOOD LEFT FOREARM Performed at [facility], [address] [place], [place] [address]    Special Requests   Final    BOTTLES DRAWN AEROBIC ONLY Blood Culture adequate volume Performed at [facility], [address], [place], [place] [address]    Culture   Final    NO GROWTH 4 DAYS Performed at [facility], [address] [place], [place] [address]    Report Status PENDING  Incomplete  MRSA PCR Screening     Status: Abnormal   Collection Time: 12/23/19 10:37 AM   Specimen: Nasopharyngeal  Result Value Ref Range Status   MRSA by PCR POSITIVE (A) NEGATIVE Final    Comment:        The GeneXpert MRSA Assay (FDA approved for NASAL specimens only), is one component of a comprehensive MRSA colonization surveillance program. It is not intended to diagnose MRSA infection nor to guide or monitor treatment for MRSA infections. RESULT CALLED TO, READ BACK BY AND VERIFIED WITH: D.BELFIELD AT 1438 ON 12/23/19 BY N.THOMPSON Performed at [facility], [address], [place], [place] [address]          Radiology Studies: No results found.      Scheduled Meds: . doxycycline  100 mg Oral Q12H  . DULoxetine  60 mg Oral Daily  . enoxaparin (LOVENOX) injection  40 mg Subcutaneous Q24H  . feeding supplement (PRO-STAT  SUGAR FREE 64)  30 mL Oral TID  . gabapentin  600 mg Oral TID  . insulin aspart  0-15 Units Subcutaneous TID AC & HS  . metoprolol tartrate  12.5 mg Oral BID  . multivitamin  1 tablet Oral Daily  . nutrition supplement (JUVEN)  1 packet Oral BID BM  . Ensure Max Protein  11 oz Oral Daily  . sodium chloride flush  10-40 mL Intracatheter Q12H  . sodium chloride flush  3 mL Intravenous Once   Continuous Infusions: . meropenem (MERREM) IV 1 g (12/26/19 1330)     LOS: 4 days    Time spent: 35 mins    Talib Headley, MD [facility]   To contact the attending provider between 7A-7P or the covering provider during after hours 7P-7A, please log into the web site [email or web address] and access using universal [place] password for that web site. If you do not have the password, please call the hospital operator.  12/26/2019, 6:23 PM

## 2019-12-26 NOTE — Progress Notes (Signed)
PT Cancellation Note  Patient Details Name: [name] MRN: [id number] DOB: 10/11/1950   Cancelled Treatment:    Reason Eval/Treat Not Completed: PT screened, no needs identified, will sign off Per TOC note: pt is a long term care resident at [facility] and can dc back there at discharge.  Also per chart review, pt with history of refusing care, including PT so will defer PT needs to SNF.     Sally-Anne Wamble,KATHrine E 12/26/2019, 9:07 AM Paulino Door, DPT Acute Rehabilitation Services Office: [phone]

## 2019-12-27 DIAGNOSIS — M86661 Other chronic osteomyelitis, right tibia and fibula: Secondary | ICD-10-CM | POA: Diagnosis not present

## 2019-12-27 DIAGNOSIS — M869 Osteomyelitis, unspecified: Secondary | ICD-10-CM | POA: Diagnosis not present

## 2019-12-27 DIAGNOSIS — L03116 Cellulitis of left lower limb: Secondary | ICD-10-CM | POA: Diagnosis not present

## 2019-12-27 DIAGNOSIS — L03115 Cellulitis of right lower limb: Secondary | ICD-10-CM | POA: Diagnosis not present

## 2019-12-27 LAB — CULTURE, BLOOD (ROUTINE X 2)
Culture: NO GROWTH
Culture: NO GROWTH
Special Requests: ADEQUATE

## 2019-12-27 LAB — GLUCOSE, CAPILLARY
Glucose-Capillary: 87 mg/dL (ref 70–99)
Glucose-Capillary: 113 mg/dL — ABNORMAL HIGH (ref 70–99)
Glucose-Capillary: 107 mg/dL — ABNORMAL HIGH (ref 70–99)
Glucose-Capillary: 117 mg/dL — ABNORMAL HIGH (ref 70–99)

## 2019-12-27 NOTE — Progress Notes (Signed)
PROGRESS NOTE    Byrd HesselbachSusan M Lombardozzi  RUE:[id number]:[id number] DOB: 03/10/1951 DOA: 12/21/2019 PCP: Unknown [name], [name], DO    Brief Narrative:  69 y.o. year old female with medical history significant for atrial fibrillation/flutter, bipolar disorder, type 2 diabetes, anemia chronic venous ulceration wounds of bilateral legs with history of declining compression wrap for treatment as well as surgery follow-up wound care center who presented on 12/21/2019 with worsening pain from ulcers of the bilateral legs at her facility.On presentation patient was afebrile, hemodynamically stable work-up notable for soft tissue swelling of left fibula on x-ray without signs of osteo and x-ray right fibula compatible with chronic or acute on chronic osteomyelitis.  Patient was started empirically on vancomycin and ceftriaxone  Hospital course complicated by patient declining much of medical treatment particularly compression stockings, seems to be okay with IV antibiotics, does not want to consider any surgical interventions   Assessment & Plan:   Principal Problem:   Bilateral cellulitis of lower leg Active Problems:   Atrial fibrillation (HCC)   Venous stasis ulcers of both lower extremities (HCC)   Controlled type 2 diabetes mellitus with circulatory disorder, without long-term current use of insulin (HCC)   Osteomyelitis of right fibula (HCC)   Pseudomonas aeruginosa infection  #Chronic biateral lower extremity wounds with nonpurulent cellulitis in setting of chronic venous insufficiency, X-ray of left tibia/fibula/foot shows generalized soft tissue swelling without osseous findings.    -Assessed by orthopedics who notes improvement in appearance of these lesions from prior evaluation. On admission had drainage, foul smell, superficial swab of wound positive for abundant Pseudomonas, MRSA PCR positive however blood cultures unremarkable, no MRSA on skin cultures.  Discussed with on-call ID (Dr. Ilsa IhaSnyder), superficial  skin swab not the best confirmation, but given concern for chronic osteomyelitis recommends treatment -Have discontinued vancomycin -Ideal therapy would be IV meropenem for pseudomonal coverage with doxycycline for MRSA coverage for 2 weeks -If patient is not amenable to return to facility and going home instead would recommend oral regimen of doxycycline and Augmentin for 2 weeks -Patient does not want to consider any type of surgery, she does not want compression wraps, she will consider compression stockings -Plan outpatient follow-up with orthopedics, Dr. Aldean BakerMarcus Duda after discharge to provide follow-up long-term care for ulcer/infection --PT eval, if amenable to SNF will need PICC -Will need ID consult 12/28/19   #Bipolar disorder.  Noted in chart.  Patient denies history of. -Psychiatry consulted, no imminent risk to self or others per their assessment, recommended Neurontin  for mood stabilization --patient already on neurontin 600 mg TID for neuropathy  #Hypertension, at goal -Continue Lopressor 12.5 mg twice daily  #Type 2 diabetes, controlled with peripheral neuropathy -Continue gabapentin -Sliding scale as needed, monitor CBGs  #Normocytic anemia, chronic.  Hemoglobin stable at baseline.  No signs of bleeding. -Daily CBC  DVT prophylaxis: Lovenox   code Status: DNR Family Communication: None   Disposition Plan:  Status is: Inpatient  Remains inpatient appropriate because:Inpatient level of care appropriate due to severity of illness   Dispo: The patient is from: SNF              Anticipated d/c is to: SNF              Anticipated d/c date is: 3 days              Patient currently is not medically stable to d/c.          Consultants:   Orthopedics  Procedures: None   Antimicrobials: Doxycycline and meropenem.    Subjective: Patient extremely agitated.  No new complaints.  Objective: Vitals:   12/26/19 1326 12/26/19 2130 12/27/19 0529  12/27/19 1208  BP: 109/62 118/67 (!) 132/91 104/74  Pulse: 67 79 86 (!) 54  Resp: 16 17 17 16   Temp: 97.9 F (36.6 C) 98.8 F (37.1 C) 98.4 F (36.9 C) 98.5 F (36.9 C)  TempSrc: Oral Oral Oral Oral  SpO2: 97% 95% 96% 95%  Weight:      Height:        Intake/Output Summary (Last 24 hours) at 12/27/2019 1447 Last data filed at 12/27/2019 0532 Gross per 24 hour  Intake --  Output 1250 ml  Net -1250 ml   Filed Weights   12/21/19 2210  Weight: 80.7 kg    Examination:  General exam: Appears calm and comfortable  Respiratory system: Clear to auscultation. Respiratory effort normal. Cardiovascular system: S1 & S2 heard, RRR. No JVD, murmurs, rubs, gallops or clicks. No pedal edema. Gastrointestinal system: Abdomen is nondistended, soft and nontender. No organomegaly or masses felt. Normal bowel sounds heard. Central nervous system: Alert and oriented. No focal neurological deficits. Extremities: Symmetric 5 x 5 power.  Both extremities wrapped in bandages therefore unable to visualize wounds. Skin: No rashes, lesions or ulcers Psychiatry: Judgement and insight appear normal. Mood & affect appropriate.     Data Reviewed: I have personally reviewed following labs and imaging studies  CBC: Recent Labs  Lab 12/21/19 2330 12/23/19 0507 12/24/19 0905 12/25/19 0509 12/26/19 0606  WBC 4.8 6.2 4.0 3.4* 3.6*  NEUTROABS 2.8  --   --   --   --   HGB 8.5* 8.5* 8.1* 8.1* 8.8*  HCT 29.2* 28.6* 27.7* 27.8* 30.9*  MCV 84.6 84.9 84.5 85.3 85.4  PLT 231 230 196 194 223   Basic Metabolic Panel: Recent Labs  Lab 12/21/19 2330 12/23/19 0507 12/25/19 0509  NA 138 136 139  K 4.1 4.2 3.5  CL 103 100 103  CO2 27 25 28   GLUCOSE 110* 97 96  BUN 24* 18 19  CREATININE 1.25* 0.96 1.11*  CALCIUM 8.8* 8.8* 8.3*   GFR: Estimated Creatinine Clearance: 51.3 mL/min (A) (by C-G formula based on SCr of 1.11 mg/dL (H)). Liver Function Tests: Recent Labs  Lab 12/21/19 2330 12/23/19 0507   AST 14* 16  ALT 11 10  ALKPHOS 88 90  BILITOT 0.5 0.9  PROT 7.5 7.3  ALBUMIN 3.0* 2.9*   No results for input(s): LIPASE, AMYLASE in the last 168 hours. No results for input(s): AMMONIA in the last 168 hours. Coagulation Profile: Recent Labs  Lab 12/23/19 0507  INR 1.2   Cardiac Enzymes: No results for input(s): CKTOTAL, CKMB, CKMBINDEX, TROPONINI in the last 168 hours. BNP (last 3 results) No results for input(s): PROBNP in the last 8760 hours. HbA1C: No results for input(s): HGBA1C in the last 72 hours. CBG: Recent Labs  Lab 12/26/19 1259 12/26/19 1703 12/26/19 2132 12/27/19 0746 12/27/19 1213  GLUCAP 133* 111* 103* 87 113*   Lipid Profile: No results for input(s): CHOL, HDL, LDLCALC, TRIG, CHOLHDL, LDLDIRECT in the last 72 hours. Thyroid Function Tests: No results for input(s): TSH, T4TOTAL, FREET4, T3FREE, THYROIDAB in the last 72 hours. Anemia Panel: No results for input(s): VITAMINB12, FOLATE, FERRITIN, TIBC, IRON, RETICCTPCT in the last 72 hours. Sepsis Labs: Recent Labs  Lab 12/21/19 2324  LATICACIDVEN 0.7    Recent Results (from the past 240 hour(s))  Respiratory Panel by RT PCR (Flu A&B, Covid) - Nasopharyngeal Swab     Status: None   Collection Time: 12/22/19  6:49 AM   Specimen: Nasopharyngeal Swab  Result Value Ref Range Status   SARS Coronavirus 2 by RT PCR NEGATIVE NEGATIVE Final    Comment: (NOTE) SARS-CoV-2 target nucleic acids are NOT DETECTED. The SARS-CoV-2 RNA is generally detectable in upper respiratoy specimens during the acute phase of infection. The lowest concentration of SARS-CoV-2 viral copies this assay can detect is 131 copies/mL. A negative result does not preclude SARS-Cov-2 infection and should not be used as the sole basis for treatment or other patient management decisions. A negative result may occur with  improper specimen collection/handling, submission of specimen other than nasopharyngeal swab, presence of viral  mutation(s) within the areas targeted by this assay, and inadequate number of viral copies (<131 copies/mL). A negative result must be combined with clinical observations, patient history, and epidemiological information. The expected result is Negative. Fact Sheet for Patients:  https://www.moore.com/ Fact Sheet for Healthcare Providers:  https://www.young.biz/ This test is not yet ap proved or cleared by the [place] FDA and  has been authorized for detection and/or diagnosis of SARS-CoV-2 by FDA under an Emergency Use Authorization (EUA). This EUA will remain  in effect (meaning this test can be used) for the duration of the COVID-19 declaration under Section 564(b)(1) of the Act, 21 U.S.C. section 360bbb-3(b)(1), unless the authorization is terminated or revoked sooner.    Influenza A by PCR NEGATIVE NEGATIVE Final   Influenza B by PCR NEGATIVE NEGATIVE Final    Comment: (NOTE) The Xpert Xpress SARS-CoV-2/FLU/RSV assay is intended as an aid in  the diagnosis of influenza from Nasopharyngeal swab specimens and  should not be used as a sole basis for treatment. Nasal washings and  aspirates are unacceptable for Xpert Xpress SARS-CoV-2/FLU/RSV  testing. Fact Sheet for Patients: https://www.moore.com/ Fact Sheet for Healthcare Providers: https://www.young.biz/ This test is not yet approved or cleared by the [place] FDA and  has been authorized for detection and/or diagnosis of SARS-CoV-2 by  FDA under an Emergency Use Authorization (EUA). This EUA will remain  in effect (meaning this test can be used) for the duration of the  Covid-19 declaration under Section 564(b)(1) of the Act, 21  U.S.C. section 360bbb-3(b)(1), unless the authorization is  terminated or revoked. Performed at [facility], [address] [place], [place] [address]   Aerobic/Anaerobic Culture  (surgical/deep wound)     Status: None   Collection Time: 12/22/19  7:00 AM   Specimen: Wound  Result Value Ref Range Status   Specimen Description   Final    WOUND RT LOWER LEG Performed at [facility], [address], [place], [place] [address]    Special Requests   Final    Normal Performed at [facility], [address], [place], [place] [address]    Gram Stain   Final    FEW WBC PRESENT, PREDOMINANTLY PMN MODERATE GRAM NEGATIVE RODS    Culture   Final    ABUNDANT PSEUDOMONAS AERUGINOSA FEW PROTEUS MIRABILIS FEW STREPTOCOCCUS GROUP G ABUNDANT BACTEROIDES FRAGILIS BETA LACTAMASE POSITIVE Performed at [facility], [address] [place], [place] [address]    Report Status 12/25/2019 FINAL  Final   Organism ID, Bacteria PSEUDOMONAS AERUGINOSA  Final   Organism ID, Bacteria PROTEUS MIRABILIS  Final      Susceptibility   Pseudomonas aeruginosa - MIC*  CEFTAZIDIME 2 SENSITIVE Sensitive     CIPROFLOXACIN 2 INTERMEDIATE Intermediate     GENTAMICIN <=1 SENSITIVE Sensitive     IMIPENEM 2 SENSITIVE Sensitive     CEFEPIME <=1 SENSITIVE Sensitive     PIP/TAZO 32 SENSITIVE Sensitive     * ABUNDANT PSEUDOMONAS AERUGINOSA   Proteus mirabilis - MIC*    AMPICILLIN 4 SENSITIVE Sensitive     CEFAZOLIN <=4 SENSITIVE Sensitive     CEFEPIME <=1 SENSITIVE Sensitive     CEFTAZIDIME <=1 SENSITIVE Sensitive     CEFTRIAXONE <=1 SENSITIVE Sensitive     CIPROFLOXACIN >=4 RESISTANT Resistant     GENTAMICIN >=16 RESISTANT Resistant     IMIPENEM 2 SENSITIVE Sensitive     TRIMETH/SULFA >=320 RESISTANT Resistant     AMPICILLIN/SULBACTAM 4 SENSITIVE Sensitive     PIP/TAZO <=4 SENSITIVE Sensitive     * FEW PROTEUS MIRABILIS  Culture, blood (routine x 2)     Status: None   Collection Time: 12/22/19  1:17 PM   Specimen: BLOOD RIGHT FOREARM  Result Value Ref Range Status   Specimen Description   Final    BLOOD RIGHT FOREARM Performed at [place] [address] [place], [place] [address]    Special Requests   Final    BOTTLES DRAWN AEROBIC ONLY Blood Culture results may not be optimal due to an inadequate volume of blood received in culture bottles Performed at [place] [address], [place], [place] [address]    Culture   Final    NO GROWTH 5 DAYS Performed at [facility], [place] [address], [place], [place] [address]    Report Status 12/27/2019 FINAL  Final  Culture, blood (routine x 2)     Status: None   Collection Time: 12/22/19  1:44 PM   Specimen: BLOOD LEFT FOREARM  Result Value Ref Range Status   Specimen Description   Final    BLOOD LEFT FOREARM Performed at [place] [address] [place], [place] [address]    Special Requests   Final    BOTTLES DRAWN AEROBIC ONLY Blood Culture adequate volume Performed at [place] [address], [place], [place] [address]    Culture   Final    NO GROWTH 5 DAYS Performed at [facility], [place] [address], [place], [place] [address]    Report Status 12/27/2019 FINAL  Final  MRSA PCR Screening     Status: Abnormal   Collection Time: 12/23/19 10:37 AM   Specimen: Nasopharyngeal  Result Value Ref Range Status   MRSA by PCR POSITIVE (A) NEGATIVE Final    Comment:        The GeneXpert MRSA Assay (FDA approved for NASAL specimens only), is one component of a comprehensive MRSA colonization surveillance program. It is not intended to diagnose MRSA infection nor to guide or monitor treatment for MRSA infections. RESULT CALLED TO, READ BACK BY AND VERIFIED WITH: D.BELFIELD AT 1438 ON 12/23/19 BY N.THOMPSON Performed at [facility], [place] [address], [place],  [address]          Radiology Studies: No results found.      Scheduled Meds: . doxycycline  100 mg Oral Q12H  . DULoxetine  60 mg Oral Daily  . enoxaparin (LOVENOX) injection  40  mg Subcutaneous Q24H  . feeding supplement (PRO-STAT SUGAR FREE 64)  30 mL Oral TID  . gabapentin  600 mg Oral  TID  . insulin aspart  0-15 Units Subcutaneous TID AC & HS  . metoprolol tartrate  12.5 mg Oral BID  . multivitamin  1 tablet Oral Daily  . nutrition supplement (JUVEN)  1 packet Oral BID BM  . Ensure Max Protein  11 oz Oral Daily  . sodium chloride flush  10-40 mL Intracatheter Q12H  . sodium chloride flush  3 mL Intravenous Once   Continuous Infusions: . meropenem (MERREM) IV 1 g (12/27/19 1336)     LOS: 5 days    Time spent: 35 mins    Salaya Holtrop, MD [facility]   To contact the attending provider between 7A-7P or the covering provider during after hours 7P-7A, please log into the web site [email or web address] and access using universal [place] password for that web site. If you do not have the password, please call the hospital operator.  12/27/2019, 2:47 PM

## 2019-12-28 ENCOUNTER — Inpatient Hospital Stay: Payer: Self-pay

## 2019-12-28 DIAGNOSIS — L03115 Cellulitis of right lower limb: Secondary | ICD-10-CM | POA: Diagnosis not present

## 2019-12-28 DIAGNOSIS — L03116 Cellulitis of left lower limb: Secondary | ICD-10-CM | POA: Diagnosis not present

## 2019-12-28 DIAGNOSIS — M86661 Other chronic osteomyelitis, right tibia and fibula: Secondary | ICD-10-CM | POA: Diagnosis not present

## 2019-12-28 DIAGNOSIS — M869 Osteomyelitis, unspecified: Secondary | ICD-10-CM | POA: Diagnosis not present

## 2019-12-28 LAB — GLUCOSE, CAPILLARY
Glucose-Capillary: 83 mg/dL (ref 70–99)
Glucose-Capillary: 87 mg/dL (ref 70–99)
Glucose-Capillary: 118 mg/dL — ABNORMAL HIGH (ref 70–99)
Glucose-Capillary: 110 mg/dL — ABNORMAL HIGH (ref 70–99)

## 2019-12-28 LAB — BASIC METABOLIC PANEL
Anion gap: 3 — ABNORMAL LOW (ref 5–15)
BUN: 23 mg/dL (ref 8–23)
CO2: 28 mmol/L (ref 22–32)
Calcium: 8.4 mg/dL — ABNORMAL LOW (ref 8.9–10.3)
Chloride: 107 mmol/L (ref 98–111)
Creatinine, Ser: 0.98 mg/dL (ref 0.44–1.00)
GFR calc Af Amer: >60 mL/min (ref >60)
GFR calc non Af Amer: 59 mL/min — ABNORMAL LOW (ref >60)
Glucose, Bld: 100 mg/dL — ABNORMAL HIGH (ref 70–99)
Potassium: 3.9 mmol/L (ref 3.5–5.1)
Sodium: 138 mmol/L (ref 135–145)

## 2019-12-28 LAB — CBC
HCT: 30 % — ABNORMAL LOW (ref 36.0–46.0)
Hemoglobin: 8.6 g/dL — ABNORMAL LOW (ref 12.0–15.0)
MCH: 24.9 pg — ABNORMAL LOW (ref 26.0–34.0)
MCHC: 28.7 g/dL — ABNORMAL LOW (ref 30.0–36.0)
MCV: 86.7 fL (ref 80.0–100.0)
Platelets: 225 10*3/uL (ref 150–400)
RBC: 3.46 MIL/uL — ABNORMAL LOW (ref 3.87–5.11)
RDW: 17.4 % — ABNORMAL HIGH (ref 11.5–15.5)
WBC: 4 10*3/uL (ref 4.0–10.5)
nRBC: 0 % (ref 0.0–0.2)

## 2019-12-28 LAB — SEDIMENTATION RATE: Sed Rate: 60 mm/hr — ABNORMAL HIGH (ref 0–22)

## 2019-12-28 LAB — C-REACTIVE PROTEIN: CRP: 1.1 mg/dL — ABNORMAL HIGH (ref <1.0)

## 2019-12-28 MED ORDER — SODIUM CHLORIDE 0.9 % IV SOLN
2.0000 g | Freq: Two times a day (BID) | INTRAVENOUS | Status: DC
  Administered 2019-12-28 – 2019-12-29 (×3): 2 g via INTRAVENOUS
  Filled 2019-12-28 (×4): qty 2

## 2019-12-28 MED ORDER — METRONIDAZOLE 500 MG PO TABS
500.0000 mg | ORAL_TABLET | Freq: Three times a day (TID) | ORAL | Status: DC
  Administered 2019-12-28 – 2019-12-29 (×4): 500 mg via ORAL
  Filled 2019-12-28 (×4): qty 1

## 2019-12-28 NOTE — Progress Notes (Addendum)
PROGRESS NOTE    [name]  JQB:[id number] DOB: August 15, 1951 DOA: 12/21/2019 PCP: Cleophas Dunker, DO    Brief Narrative:  69 y.o. year old female with medical history significant for atrial fibrillation/flutter, bipolar disorder, type 2 diabetes, anemia chronic venous ulceration wounds of bilateral legs with history of declining compression wrap for treatment as well as surgery follow-up wound care center who presented on 12/21/2019 with worsening pain from ulcers of the bilateral legs at her facility.On presentation patient was afebrile, hemodynamically stable work-up notable for soft tissue swelling of left fibula on x-ray without signs of osteo and x-ray right fibula compatible with chronic or acute on chronic osteomyelitis.  Patient was started empirically on vancomycin and ceftriaxone  Hospital course complicated by patient declining much of medical treatment particularly compression stockings, seems to be okay with IV antibiotics, does not want to consider any surgical interventions   Assessment & Plan:   Principal Problem:   Bilateral cellulitis of lower leg Active Problems:   Atrial fibrillation (HCC)   Venous stasis ulcers of both lower extremities ([place])   Controlled type 2 diabetes mellitus with circulatory disorder, without long-term current use of insulin (HCC)   Osteomyelitis of right fibula (HCC)   Pseudomonas aeruginosa infection  #Chronic biateral lower extremity wounds with nonpurulent cellulitis in setting of chronic venous insufficiency, X-ray of left tibia/fibula/foot shows generalized soft tissue swelling without osseous findings.    -Assessed by orthopedics who notes improvement in appearance of these lesions from prior evaluation. On admission had drainage, foul smell, superficial swab of wound positive for abundant Pseudomonas, MRSA PCR positive however blood cultures unremarkable, no MRSA on skin cultures.   -ID recommends  doing cefepime 2gm IV plus oral  metronidazole for additional 4 days to finish out course of treatment for wounds--Will need PICC X 4days.   - continue with doxycycline 152m bid orally for 4 weeks - Serial CRP and ESR.  - continue with wound care recommendation - ID will follow up with patient as outpatient  -If patient is not amenable to return to facility and going home instead would recommend oral regimen of doxycycline and Augmentin for 2 weeks -outpatient follow-up with orthopedics, Dr. MMeridee Scoreafter discharge to provide follow-up long-term care for ulcer/infection   #Bipolar disorder.  Noted in chart.  Patient denies history of. -Psychiatry consulted, no imminent risk to self or others per their assessment, recommended Neurontin  for mood stabilization --patient already on neurontin 600 mg TID for neuropathy  #Hypertension, at goal -Continue Lopressor 12.5 mg twice daily  #Type 2 diabetes, controlled with peripheral neuropathy -Continue gabapentin -Sliding scale as needed, monitor CBGs  #Normocytic anemia, chronic.  Hemoglobin stable at baseline.  No signs of bleeding. -Daily CBC  DVT prophylaxis: Lovenox   code Status: DNR Family Communication: None   Disposition Plan:  Status is: Inpatient  Remains inpatient appropriate because:Inpatient level of care appropriate due to severity of illness   Dispo: The patient is from: SNF              Anticipated d/c is to: SNF              Anticipated d/c date is: 3 days              Patient currently is not medically stable to d/c.          Consultants:   Orthopedics, ID consult.    Procedures: None   Antimicrobials: Cefazolin and Flagyl   Subjective: Patient  extremely agitated.  No new complaints.  Objective: Vitals:   12/27/19 1208 12/27/19 2113 12/28/19 0610 12/28/19 1320  BP: 104/74 (!) 110/53 (!) 100/56 125/70  Pulse: (!) 54 76 64 74  Resp: '16 16 16   ' Temp: 98.5 F (36.9 C) 98.8 F (37.1 C) 98 F (36.7 C)   TempSrc:  Oral Oral Oral   SpO2: 95% 94% 94%   Weight:      Height:        Intake/Output Summary (Last 24 hours) at 12/28/2019 1833 Last data filed at 12/28/2019 1700 Gross per 24 hour  Intake 1540 ml  Output 900 ml  Net 640 ml   Filed Weights   12/21/19 2210  Weight: 80.7 kg    Examination:  General exam: Appears calm and comfortable  Respiratory system: Clear to auscultation. Respiratory effort normal. Cardiovascular system: S1 & S2 heard, RRR. No JVD, murmurs, rubs, gallops or clicks. No pedal edema. Gastrointestinal system: Abdomen is nondistended, soft and nontender. No organomegaly or masses felt. Normal bowel sounds heard. Central nervous system: Alert and oriented. No focal neurological deficits. Extremities: Symmetric 5 x 5 power.  Both extremities wrapped in bandages therefore unable to visualize wounds. Skin: No rashes, lesions or ulcers Psychiatry: Judgement and insight appear normal. Mood & affect appropriate.     Data Reviewed: I have personally reviewed following labs and imaging studies  CBC: Recent Labs  Lab 12/21/19 2330 12/21/19 2330 12/23/19 0507 12/24/19 0905 12/25/19 0509 12/26/19 0606 12/28/19 0601  WBC 4.8   < > 6.2 4.0 3.4* 3.6* 4.0  NEUTROABS 2.8  --   --   --   --   --   --   HGB 8.5*   < > 8.5* 8.1* 8.1* 8.8* 8.6*  HCT 29.2*   < > 28.6* 27.7* 27.8* 30.9* 30.0*  MCV 84.6   < > 84.9 84.5 85.3 85.4 86.7  PLT 231   < > 230 196 194 223 225   < > = values in this interval not displayed.   Basic Metabolic Panel: Recent Labs  Lab 12/21/19 2330 12/23/19 0507 12/25/19 0509 12/28/19 0601  NA 138 136 139 138  K 4.1 4.2 3.5 3.9  CL 103 100 103 107  CO2 '27 25 28 28  ' GLUCOSE 110* 97 96 100*  BUN 24* '18 19 23  ' CREATININE 1.25* 0.96 1.11* 0.98  CALCIUM 8.8* 8.8* 8.3* 8.4*   GFR: Estimated Creatinine Clearance: 58.1 mL/min (by C-G formula based on SCr of 0.98 mg/dL). Liver Function Tests: Recent Labs  Lab 12/21/19 2330 12/23/19 0507  AST 14* 16    ALT 11 10  ALKPHOS 88 90  BILITOT 0.5 0.9  PROT 7.5 7.3  ALBUMIN 3.0* 2.9*   No results for input(s): LIPASE, AMYLASE in the last 168 hours. No results for input(s): AMMONIA in the last 168 hours. Coagulation Profile: Recent Labs  Lab 12/23/19 0507  INR 1.2   Cardiac Enzymes: No results for input(s): CKTOTAL, CKMB, CKMBINDEX, TROPONINI in the last 168 hours. BNP (last 3 results) No results for input(s): PROBNP in the last 8760 hours. HbA1C: No results for input(s): HGBA1C in the last 72 hours. CBG: Recent Labs  Lab 12/27/19 1739 12/27/19 2115 12/28/19 0751 12/28/19 1125 12/28/19 1652  GLUCAP 107* 117* 83 87 118*   Lipid Profile: No results for input(s): CHOL, HDL, LDLCALC, TRIG, CHOLHDL, LDLDIRECT in the last 72 hours. Thyroid Function Tests: No results for input(s): TSH, T4TOTAL, FREET4, T3FREE, THYROIDAB in the last  72 hours. Anemia Panel: No results for input(s): VITAMINB12, FOLATE, FERRITIN, TIBC, IRON, RETICCTPCT in the last 72 hours. Sepsis Labs: Recent Labs  Lab 12/21/19 2324  LATICACIDVEN 0.7    Recent Results (from the past 240 hour(s))  Respiratory Panel by RT PCR (Flu A&B, Covid) - Nasopharyngeal Swab     Status: None   Collection Time: 12/22/19  6:49 AM   Specimen: Nasopharyngeal Swab  Result Value Ref Range Status   SARS Coronavirus 2 by RT PCR NEGATIVE NEGATIVE Final    Comment: (NOTE) SARS-CoV-2 target nucleic acids are NOT DETECTED. The SARS-CoV-2 RNA is generally detectable in upper respiratoy specimens during the acute phase of infection. The lowest concentration of SARS-CoV-2 viral copies this assay can detect is 131 copies/mL. A negative result does not preclude SARS-Cov-2 infection and should not be used as the sole basis for treatment or other patient management decisions. A negative result may occur with  improper specimen collection/handling, submission of specimen other than nasopharyngeal swab, presence of viral mutation(s) within  the areas targeted by this assay, and inadequate number of viral copies (<131 copies/mL). A negative result must be combined with clinical observations, patient history, and epidemiological information. The expected result is Negative. Fact Sheet for Patients:  PinkCheek.be Fact Sheet for Healthcare Providers:  GravelBags.it This test is not yet ap proved or cleared by the [place] FDA and  has been authorized for detection and/or diagnosis of SARS-CoV-2 by FDA under an Emergency Use Authorization (EUA). This EUA will remain  in effect (meaning this test can be used) for the duration of the COVID-19 declaration under Section 564(b)(1) of the Act, 21 U.S.C. section 360bbb-3(b)(1), unless the authorization is terminated or revoked sooner.    Influenza A by PCR NEGATIVE NEGATIVE Final   Influenza B by PCR NEGATIVE NEGATIVE Final    Comment: (NOTE) The Xpert Xpress SARS-CoV-2/FLU/RSV assay is intended as an aid in  the diagnosis of influenza from Nasopharyngeal swab specimens and  should not be used as a sole basis for treatment. Nasal washings and  aspirates are unacceptable for Xpert Xpress SARS-CoV-2/FLU/RSV  testing. Fact Sheet for Patients: PinkCheek.be Fact Sheet for Healthcare Providers: GravelBags.it This test is not yet approved or cleared by the [place] FDA and  has been authorized for detection and/or diagnosis of SARS-CoV-2 by  FDA under an Emergency Use Authorization (EUA). This EUA will remain  in effect (meaning this test can be used) for the duration of the  Covid-19 declaration under Section 564(b)(1) of the Act, 21  U.S.C. section 360bbb-3(b)(1), unless the authorization is  terminated or revoked. Performed at [facility], [place] [address] [place], [place] [address]   Aerobic/Anaerobic Culture (surgical/deep wound)      Status: None   Collection Time: 12/22/19  7:00 AM   Specimen: Wound  Result Value Ref Range Status   Specimen Description   Final    WOUND RT LOWER LEG Performed at [facility] [address], [place], [place] [address]    Special Requests   Final    Normal Performed at [facility], [place] [address] [place], [place] [address]    Gram Stain   Final    FEW WBC PRESENT, PREDOMINANTLY PMN MODERATE GRAM NEGATIVE RODS    Culture   Final    ABUNDANT PSEUDOMONAS AERUGINOSA FEW PROTEUS MIRABILIS FEW STREPTOCOCCUS GROUP G ABUNDANT BACTEROIDES FRAGILIS BETA LACTAMASE POSITIVE Performed at [facility], [place],  [place] [address]    Report Status 12/25/2019 FINAL  Final   Organism ID, Bacteria PSEUDOMONAS AERUGINOSA  Final   Organism ID, Bacteria PROTEUS MIRABILIS  Final      Susceptibility   Pseudomonas aeruginosa - MIC*    CEFTAZIDIME 2 SENSITIVE Sensitive     CIPROFLOXACIN 2 INTERMEDIATE Intermediate     GENTAMICIN <=1 SENSITIVE Sensitive     IMIPENEM 2 SENSITIVE Sensitive     CEFEPIME <=1 SENSITIVE Sensitive     PIP/TAZO 32 SENSITIVE Sensitive     * ABUNDANT PSEUDOMONAS AERUGINOSA   Proteus mirabilis - MIC*    AMPICILLIN 4 SENSITIVE Sensitive     CEFAZOLIN <=4 SENSITIVE Sensitive     CEFEPIME <=1 SENSITIVE Sensitive     CEFTAZIDIME <=1 SENSITIVE Sensitive     CEFTRIAXONE <=1 SENSITIVE Sensitive     CIPROFLOXACIN >=4 RESISTANT Resistant     GENTAMICIN >=16 RESISTANT Resistant     IMIPENEM 2 SENSITIVE Sensitive     TRIMETH/SULFA >=320 RESISTANT Resistant     AMPICILLIN/SULBACTAM 4 SENSITIVE Sensitive     PIP/TAZO <=4 SENSITIVE Sensitive     * FEW PROTEUS MIRABILIS  Culture, blood (routine x 2)     Status: None   Collection Time: 12/22/19  1:17 PM   Specimen: BLOOD RIGHT FOREARM  Result Value Ref Range Status   Specimen Description   Final    BLOOD RIGHT FOREARM Performed at [facility] [address], [place], [place] [address]    Special Requests   Final    BOTTLES DRAWN AEROBIC ONLY Blood Culture results may not be optimal due to an inadequate volume of blood received in culture bottles Performed at [facility] [address] [place], [place] [address]    Culture   Final    NO GROWTH 5 DAYS Performed at [facility], [place] [address] [place], [place] [address]    Report Status 12/27/2019 FINAL  Final  Culture, blood (routine x 2)     Status: None   Collection Time: 12/22/19  1:44 PM   Specimen: BLOOD LEFT FOREARM  Result Value Ref Range Status   Specimen Description   Final    BLOOD LEFT FOREARM Performed at [facility] [address] [place], [place] [address]    Special Requests   Final    BOTTLES DRAWN AEROBIC ONLY Blood Culture adequate volume Performed at [facility] [address] [place], [place] [address]    Culture   Final    NO GROWTH 5 DAYS Performed at [facility], [place] [address] [place], [place] [address]    Report Status 12/27/2019 FINAL  Final  MRSA PCR Screening     Status: Abnormal   Collection Time: 12/23/19 10:37 AM   Specimen: Nasopharyngeal  Result Value Ref Range Status   MRSA by PCR POSITIVE (A) NEGATIVE Final    Comment:        The GeneXpert MRSA Assay (FDA approved for NASAL specimens only), is one component of a comprehensive MRSA colonization surveillance program. It is not intended to diagnose MRSA infection nor to guide or monitor treatment for MRSA infections. RESULT CALLED TO, READ BACK BY AND VERIFIED WITH: D.BELFIELD AT 1438 ON 12/23/19 BY N.THOMPSON Performed at [facility], [place] [address] [place], [place] [address]          Radiology Studies: No results found.      Scheduled Meds: . doxycycline  100 mg Oral Q12H  . DULoxetine  60 mg Oral Daily  . enoxaparin (LOVENOX) injection  40 mg Subcutaneous Q24H  .  feeding supplement (PRO-STAT SUGAR FREE 64)  30 mL Oral TID  . gabapentin  600 mg Oral TID  . insulin aspart  0-15 Units Subcutaneous TID AC & HS  . metoprolol tartrate  12.5 mg Oral BID  . metroNIDAZOLE  500 mg Oral Q8H  . multivitamin  1 tablet Oral Daily  . nutrition supplement (JUVEN)  1 packet Oral BID BM  . Ensure Max Protein  11 oz Oral Daily  . sodium chloride flush  10-40 mL Intracatheter Q12H  . sodium chloride flush  3 mL Intravenous Once   Continuous Infusions: . ceFEPime (MAXIPIME) IV 2 g (12/28/19 1713)     LOS: 6 days    Time spent: 35 mins    Vanessa Kampf, MD [facility]   To contact the attending provider between 7A-7P or the covering provider during after hours 7P-7A, please log into the web site [email or web address] and access using universal [facility] password for that web site. If you do not have the password, please call the hospital operator.  12/28/2019, 6:33 PM

## 2019-12-28 NOTE — Consult Note (Signed)
[facility] for Infectious Disease  Total days of antibiotics 8/day 5 anti-psa               Reason for Consult: chronic wound 2/2 venous stasis  Referring Physician: Hinton Rao  Principal Problem:   Bilateral cellulitis of lower leg Active Problems:   Atrial fibrillation (HCC)   Venous stasis ulcers of both lower extremities (HCC)   Controlled type 2 diabetes mellitus with circulatory disorder, without long-term current use of insulin (HCC)   Osteomyelitis of right fibula (HCC)   Pseudomonas aeruginosa infection    HPI: [name] is a 69 y.o. female with history of bipolar disorder, dm2, chronic venous stasis with lymphedema, and chronic wounds, she was admitted on 4/27- for worsening pain, swelling, and foul odor, pictures from her admission show that she does have some erythema surrounding eschar with swelling. Also has exudate in the wound bed. Wound care has recommended aqualcell plus wrapping up legs to provide compression. She did not want to pursue any surgical debridement. She states that her wounds look better since starting iv abtx. She is on meropenem plus oral doxycycline. Her woudn cx (superficial) were polymicrobial including PsA and proteus.  Past Medical History:  Diagnosis Date  . Arthritis   . Hypoglycemia   . Pneumonia, pneumococcal ([place])     Allergies:  Allergies  Allergen Reactions  . Codeine Other (See Comments)    "Gets high"  . Tylenol [Acetaminophen] Hives    MEDICATIONS: . doxycycline  100 mg Oral Q12H  . DULoxetine  60 mg Oral Daily  . enoxaparin (LOVENOX) injection  40 mg Subcutaneous Q24H  . feeding supplement (PRO-STAT SUGAR FREE 64)  30 mL Oral TID  . gabapentin  600 mg Oral TID  . insulin aspart  0-15 Units Subcutaneous TID AC & HS  . metoprolol tartrate  12.5 mg Oral BID  . metroNIDAZOLE  500 mg Oral Q8H  . multivitamin  1 tablet Oral Daily  . nutrition supplement (JUVEN)  1 packet Oral BID BM  . Ensure Max Protein  11 oz Oral  Daily  . sodium chloride flush  10-40 mL Intracatheter Q12H  . sodium chloride flush  3 mL Intravenous Once    Social History   Tobacco Use  . Smoking status: Light Tobacco Smoker    Packs/day: 0.25  . Smokeless tobacco: Never Used  Substance Use Topics  . Alcohol use: No  . Drug use: No    Family History  Family history unknown: Yes    Review of Systems  Constitutional: Negative for fever, chills, diaphoresis, activity change, appetite change, fatigue and unexpected weight change.  HENT: Negative for congestion, sore throat, rhinorrhea, sneezing, trouble swallowing and sinus pressure.  Eyes: Negative for photophobia and visual disturbance.  Respiratory: +wheezing Negative for cough, chest tightness, shortness of breath, wheezing and stridor.  Cardiovascular: Negative for chest pain, palpitations and leg swelling.  Gastrointestinal: Negative for nausea, vomiting, abdominal pain, diarrhea, constipation, blood in stool, abdominal distention and anal bleeding.  Genitourinary: Negative for dysuria, hematuria, flank pain and difficulty urinating.  Musculoskeletal: Negative for myalgias, back pain, joint swelling, arthralgias and gait problem.  Skin: +painful wounds Negative for color change, pallor, rash and wound.  Neurological: Negative for dizziness, tremors, weakness and light-headedness.  Hematological: Negative for adenopathy. Does not bruise/bleed easily.  Psychiatric/Behavioral: Negative for behavioral problems, confusion, sleep disturbance, dysphoric mood, decreased concentration and agitation.      OBJECTIVE: Temp:  [98 F (36.7 C)-98.8 F (  37.1 C)] 98 F (36.7 C) (05/03 0610) Pulse Rate:  [64-76] 74 (05/03 1320) Resp:  [16] 16 (05/03 0610) BP: (100-125)/(53-70) 125/70 (05/03 1320) SpO2:  [94 %] 94 % (05/03 0610) Physical Exam  Constitutional:  oriented to person, place, and time. appears well-developed and well-nourished. No distress.  HENT: [place]/AT, PERRLA, no  scleral icterus Mouth/Throat: Oropharynx is clear and moist. No oropharyngeal exudate.  Cardiovascular: Normal rate, regular rhythm and normal heart sounds. Exam reveals no gallop and no friction rub.  No murmur heard.  Pulmonary/Chest: + exp wheezing with effort. Effort normal and breath sounds normal. No respiratory distress.  Neck = supple, no nuchal rigidity  BPZ:WCHE arm picc line is c/d/i Lymphadenopathy: no cervical adenopathy. No axillary adenopathy Neurological: alert and oriented to person, place, and time.  Skin: Skin is warm and dry. No rash noted. No erythema. Lower extremity bilaterally wrapped. Yellow exudate on dressing, wrinkles appreciated/decreased swelling. Patient deferred further examination Psychiatric: a normal mood and affect.  behavior is normal.   LABS: Results for orders placed or performed during the hospital encounter of 12/21/19 (from the past 48 hour(s))  Glucose, capillary     Status: Abnormal   Collection Time: 12/26/19  5:03 PM  Result Value Ref Range   Glucose-Capillary 111 (H) 70 - 99 mg/dL    Comment: Glucose reference range applies only to samples taken after fasting for at least 8 hours.   Comment 1 Notify RN   Glucose, capillary     Status: Abnormal   Collection Time: 12/26/19  9:32 PM  Result Value Ref Range   Glucose-Capillary 103 (H) 70 - 99 mg/dL    Comment: Glucose reference range applies only to samples taken after fasting for at least 8 hours.   Comment 1 Notify RN   Glucose, capillary     Status: None   Collection Time: 12/27/19  7:46 AM  Result Value Ref Range   Glucose-Capillary 87 70 - 99 mg/dL    Comment: Glucose reference range applies only to samples taken after fasting for at least 8 hours.   Comment 1 Notify RN   Glucose, capillary     Status: Abnormal   Collection Time: 12/27/19 12:13 PM  Result Value Ref Range   Glucose-Capillary 113 (H) 70 - 99 mg/dL    Comment: Glucose reference range applies only to samples taken after  fasting for at least 8 hours.   Comment 1 Notify RN   Glucose, capillary     Status: Abnormal   Collection Time: 12/27/19  5:39 PM  Result Value Ref Range   Glucose-Capillary 107 (H) 70 - 99 mg/dL    Comment: Glucose reference range applies only to samples taken after fasting for at least 8 hours.   Comment 1 Notify RN   Glucose, capillary     Status: Abnormal   Collection Time: 12/27/19  9:15 PM  Result Value Ref Range   Glucose-Capillary 117 (H) 70 - 99 mg/dL    Comment: Glucose reference range applies only to samples taken after fasting for at least 8 hours.  CBC     Status: Abnormal   Collection Time: 12/28/19  6:01 AM  Result Value Ref Range   WBC 4.0 4.0 - 10.5 K/uL   RBC 3.46 (L) 3.87 - 5.11 MIL/uL   Hemoglobin 8.6 (L) 12.0 - 15.0 g/dL   HCT 52.7 (L) 78.2 - 42.3 %   MCV 86.7 80.0 - 100.0 fL   MCH 24.9 (L) 26.0 - 34.0 pg  MCHC 28.7 (L) 30.0 - 36.0 g/dL   RDW 62.9 (H) 52.8 - 41.3 %   Platelets 225 150 - 400 K/uL   nRBC 0.0 0.0 - 0.2 %    Comment: Performed at [facility], [address], [place], [place] [address]  Basic metabolic panel     Status: Abnormal   Collection Time: 12/28/19  6:01 AM  Result Value Ref Range   Sodium 138 135 - 145 mmol/L   Potassium 3.9 3.5 - 5.1 mmol/L   Chloride 107 98 - 111 mmol/L   CO2 28 22 - 32 mmol/L   Glucose, Bld 100 (H) 70 - 99 mg/dL    Comment: Glucose reference range applies only to samples taken after fasting for at least 8 hours.   BUN 23 8 - 23 mg/dL   Creatinine, Ser 0.27 0.44 - 1.00 mg/dL   Calcium 8.4 (L) 8.9 - 10.3 mg/dL   GFR calc non Af Amer 59 (L) >60 mL/min   GFR calc Af Amer >60 >60 mL/min   Anion gap 3 (L) 5 - 15    Comment: Performed at [facility], [address] [place], [place] [address]  Glucose, capillary     Status: None   Collection Time: 12/28/19  7:51 AM  Result Value Ref Range   Glucose-Capillary 83 70 - 99 mg/dL    Comment: Glucose reference range applies only  to samples taken after fasting for at least 8 hours.  Glucose, capillary     Status: None   Collection Time: 12/28/19 11:25 AM  Result Value Ref Range   Glucose-Capillary 87 70 - 99 mg/dL    Comment: Glucose reference range applies only to samples taken after fasting for at least 8 hours.   Comment 1 Notify RN    Comment 2 Document in Chart     MICRO: reviewed IMAGING:  Assessment/Plan:  Chronic wounds with imaging showing periosteal new bone formation along the anterolateral surface of the right fibula, compatible with chronic or acute-on-chronic osteomyelitis. - will plan on doing cefepime 2gm IV plus oral metronidazole for additional 4 days to finish out course of treatment for wounds - continue with doxycycline 100mg  bid orally for 4 weeks - please get sed rate and crp - continue with wound care recommendation - we will follow up with patient as outpatient

## 2019-12-29 DIAGNOSIS — L03115 Cellulitis of right lower limb: Secondary | ICD-10-CM | POA: Diagnosis not present

## 2019-12-29 DIAGNOSIS — M86661 Other chronic osteomyelitis, right tibia and fibula: Secondary | ICD-10-CM | POA: Diagnosis not present

## 2019-12-29 DIAGNOSIS — M869 Osteomyelitis, unspecified: Secondary | ICD-10-CM | POA: Diagnosis not present

## 2019-12-29 DIAGNOSIS — L03116 Cellulitis of left lower limb: Secondary | ICD-10-CM | POA: Diagnosis not present

## 2019-12-29 LAB — GLUCOSE, CAPILLARY
Glucose-Capillary: 91 mg/dL (ref 70–99)
Glucose-Capillary: 88 mg/dL (ref 70–99)
Glucose-Capillary: 97 mg/dL (ref 70–99)

## 2019-12-29 LAB — BASIC METABOLIC PANEL
Anion gap: 5 (ref 5–15)
BUN: 22 mg/dL (ref 8–23)
CO2: 29 mmol/L (ref 22–32)
Calcium: 8.4 mg/dL — ABNORMAL LOW (ref 8.9–10.3)
Chloride: 104 mmol/L (ref 98–111)
Creatinine, Ser: 0.98 mg/dL (ref 0.44–1.00)
GFR calc Af Amer: >60 mL/min (ref >60)
GFR calc non Af Amer: 59 mL/min — ABNORMAL LOW (ref >60)
Glucose, Bld: 107 mg/dL — ABNORMAL HIGH (ref 70–99)
Potassium: 4.6 mmol/L (ref 3.5–5.1)
Sodium: 138 mmol/L (ref 135–145)

## 2019-12-29 LAB — CBC
HCT: 29.8 % — ABNORMAL LOW (ref 36.0–46.0)
Hemoglobin: 8.5 g/dL — ABNORMAL LOW (ref 12.0–15.0)
MCH: 24.4 pg — ABNORMAL LOW (ref 26.0–34.0)
MCHC: 28.5 g/dL — ABNORMAL LOW (ref 30.0–36.0)
MCV: 85.4 fL (ref 80.0–100.0)
Platelets: UNDETERMINED 10*3/uL (ref 150–400)
RBC: 3.49 MIL/uL — ABNORMAL LOW (ref 3.87–5.11)
RDW: 17.4 % — ABNORMAL HIGH (ref 11.5–15.5)
WBC: 3.8 10*3/uL — ABNORMAL LOW (ref 4.0–10.5)
nRBC: 0 % (ref 0.0–0.2)

## 2019-12-29 LAB — RESPIRATORY PANEL BY RT PCR (FLU A&B, COVID)
Influenza A by PCR: NEGATIVE
Influenza B by PCR: NEGATIVE
SARS Coronavirus 2 by RT PCR: NEGATIVE

## 2019-12-29 MED ORDER — PROSIGHT PO TABS: 1.0000 | ORAL_TABLET | Freq: Every day | ORAL | 3 refills | Status: DC

## 2019-12-29 MED ORDER — METOPROLOL TARTRATE 25 MG PO TABS: 12.5000 mg | ORAL_TABLET | Freq: Two times a day (BID) | ORAL | 3 refills | Status: AC

## 2019-12-29 MED ORDER — SODIUM CHLORIDE 0.9 % IV SOLN: 2.0000 g | Freq: Two times a day (BID) | INTRAVENOUS | 0 refills | Status: DC

## 2019-12-29 MED ORDER — JUVEN PO PACK: 1.0000 | PACK | Freq: Two times a day (BID) | ORAL | 0 refills | Status: DC

## 2019-12-29 MED ORDER — DOXYCYCLINE HYCLATE 100 MG PO TABS: 100.0000 mg | ORAL_TABLET | Freq: Two times a day (BID) | ORAL | 1 refills | Status: DC

## 2019-12-29 MED ORDER — METRONIDAZOLE 500 MG PO TABS: 500.0000 mg | ORAL_TABLET | Freq: Three times a day (TID) | ORAL | 0 refills | Status: DC

## 2019-12-29 NOTE — TOC Transition Note (Signed)
Transition of Care [facility]) - CM/SW Discharge Note   Patient Details  Name: [name] MRN: [id number] Date of Birth: 17-Nov-1950  [facility]) CM/SW Contact:  Bartholome Bill, RN Phone Number: 12/29/2019, 3:01 PM   Clinical Narrative:    Pt to dc back to LTC facility [facility] today. She will return with midline IV for 3 more days of IV abx. RN to call report to [phone] Yellow DNR on front of chart for MD signature. [facility] contacted for transport.   Final next level of care: Long Term Nursing Home Barriers to Discharge: No Barriers Identified  Discharge Plan and Services   Discharge Planning Services: CM Consult                                 Social Determinants of Health (SDOH) Interventions     Readmission Risk Interventions Readmission Risk Prevention Plan 12/29/2019  Transportation Screening Complete  PCP or Specialist Appt within 3-5 Days Complete  HRI or Home Care Consult Complete  Social Work Consult for Recovery Care Planning/Counseling Complete  Palliative Care Screening Not Applicable  Medication Review [employer]) Complete  Some recent data might be hidden

## 2019-12-29 NOTE — Progress Notes (Signed)
PHARMACY CONSULT NOTE FOR:  OUTPATIENT  PARENTERAL ANTIBIOTIC THERAPY (OPAT)  Indication: wound infection Regimen: Cefepime 2g q12 End date: 01/01/20  IV antibiotic discharge orders are pended. To discharging provider:  please sign these orders via discharge navigator,  Select New Orders & click on the button choice - Manage This Unsigned Work.     Thank you for allowing pharmacy to be a part of this patient's care.  [name] 12/29/2019, 2:41 PM

## 2019-12-29 NOTE — Progress Notes (Signed)
Nutrition Follow-up  DOCUMENTATION CODES:   Not applicable  INTERVENTION:  Continue Ensure Max daily Continue Prostat BID Continue Juven BID Continue daily MVI  Recommend obtaining new weights as able to assess trends  NUTRITION DIAGNOSIS:   Increased nutrient needs related to wound healing(bilateral cellulitis of lower leg) as evidenced by estimated needs. Ongoing, addressing via nutrition supplements    GOAL:   Patient will meet greater than or equal to 90% of their needs Meeting    MONITOR:   Labs, I & O's, Skin, Supplement acceptance, Weight trends, PO intake  REASON FOR ASSESSMENT:   Consult Wound healing  ASSESSMENT:   69 year old female with past medical history of extensive venous stasis ulcers of BLE, lymphedema, DM2, atrial fibrillation/flutter, bipolar disorder, and anemia presents with complaints of progressively worsening pain in bilateral extremities, right greater than left with increased redness and swelling, as well as increased foul smell from lower extremity wounds. X-ray of right tibia fibula revealed periosteal new bone formation along anterior surface compatible with possible acute on chronic osteomyelitis and admitted for bilateral cellulitis of lower leg.  Patient sitting up in bed and reports that she is ready to go home today. Patient requesting new wraps for her legs, RD assisted patient with calling her nurse. She endorses good appetite and intake during admission, per flowsheets she is consuming 100% of meals at this time. She recalls eating well at home and receives food assistance. Patient stated "I will get to eat like I want to when I go home and that means I will eat bacon" She reports that she is not drinking Ensure Max supplement, stated she has never liked "them things" RD educated patient increased needs for wound healing and encouraged po intake of supplements.   No new weights since admission, recommend obtaining as able to assess  trends.  Medications reviewed and include: doxyclycline, gabapentin, SSI, flagyl IVPB: Maxipime Labs: CBGs 88,91,110 x 24 hrs  Diet Order:   Diet Order            Diet heart healthy/carb modified Room service appropriate? Yes; Fluid consistency: Thin  Diet effective now              EDUCATION NEEDS:   No education needs have been identified at this time  Skin:  Skin Assessment: Skin Integrity Issues: Skin Integrity Issues:: Stage II, Diabetic Ulcer Stage II: buttocks (05/14/19) Diabetic Ulcer: BLE  Last BM:  5/2  Height:   Ht Readings from Last 1 Encounters:  12/21/19 5\' 6"  (1.676 m)    Weight:   Wt Readings from Last 1 Encounters:  12/21/19 80.7 kg    Ideal Body Weight:  59.1 kg  BMI:  Body mass index is 28.73 kg/m.  Estimated Nutritional Needs:   Kcal:  12/23/19  Protein:  105-120  Fluid:  >/= 1.9 L/day   [id number], RD, LDN Clinical Nutrition After Hours/Weekend Pager # in Amion

## 2019-12-29 NOTE — NC FL2 (Signed)
MEDICAID FL2 LEVEL OF CARE SCREENING TOOL     IDENTIFICATION  Patient Name: [name] Birthdate: [birth year] Sex: female Admission Date (Current Location): 12/21/2019  [facility] and [place] Number:  Producer, television/film/video and Address:  [facility],  [address] [place] [address]      Provider Number: [id number]  Attending Physician Name and Address:  Earney Mallet, Mohammadtokir, *  Relative Name and Phone Number:       Current Level of Care: Hospital Recommended Level of Care: Skilled Nursing Facility Prior Approval Number:    Date Approved/Denied:   PASRR Number: [id number]  Discharge Plan: SNF    Current Diagnoses: Patient Active Problem List   Diagnosis Date Noted  . Pseudomonas aeruginosa infection 12/24/2019  . Controlled type 2 diabetes mellitus with circulatory disorder, without long-term current use of insulin (HCC) 12/22/2019  . Osteomyelitis of right fibula (HCC) 12/22/2019  . Severe protein-calorie malnutrition (HCC)   . Venous stasis ulcers of both lower extremities (HCC)   . Bilateral cellulitis of lower leg 08/12/2019  . 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 Pressure injury of skin 06/19/2019  . Elevated C-reactive protein (CRP)   . Leg ulcer (HCC)   . Adjustment disorder with mixed disturbance of emotions and conduct   . Type 2 diabetes mellitus with hyperglycemia (HCC) 05/04/2019  . Infected ulcer of skin (HCC)   . High risk social situation   . Post-polio syndrome   . Chronic pain syndrome   . Limited mobility 04/20/2019  . Venous stasis ulcer (HCC) 03/31/2019  . Overgrown toenails 10/28/2018  . Wound of right leg 02/06/2018  . Atrial fibrillation (HCC) 01/03/2018  . Anemia 01/03/2018  . Elevated serum creatinine 01/03/2018  . Bipolar I disorder with mania (HCC) 01/03/2018    Orientation RESPIRATION BLADDER Height & Weight     Self  Normal Incontinent Weight: 80.7 kg Height:  5\' 6"  (167.6 cm)   BEHAVIORAL SYMPTOMS/MOOD NEUROLOGICAL BOWEL NUTRITION STATUS      Incontinent Diet(Heart Healthy carb modified)  AMBULATORY STATUS COMMUNICATION OF NEEDS Skin   Extensive Assist Verbally Other (Comment)(chronic venous ulceration bilateral legs)                       Personal Care Assistance Level of Assistance  Bathing, Dressing, Feeding Bathing Assistance: Limited assistance Feeding assistance: Limited assistance Dressing Assistance: Limited assistance     Functional Limitations Info  Sight Sight Info: Impaired        SPECIAL CARE FACTORS FREQUENCY                       Contractures      Additional Factors Info  Code Status Code Status Info: DNR             Current Medications (12/29/2019):  This is the current hospital active medication list Current Facility-Administered Medications  Medication Dose Route Frequency Provider Last Rate Last Admin  . albuterol (PROVENTIL) (2.5 MG/3ML) 0.083% nebulizer solution 2.5 mg  2.5 mg Nebulization Q6H PRN 02/28/2020, MD      . ceFEPIme (MAXIPIME) 2 g in sodium chloride 0.9 % 100 mL IVPB  2 g Intravenous Q12H Alwyn Ren, MD 200 mL/hr at 12/29/19 0528 2 g at 12/29/19 0528  . doxycycline (VIBRA-TABS) tablet 100 mg  100 mg Oral Q12H 02/28/20 D, MD   100 mg at 12/29/19 1039  . DULoxetine (CYMBALTA) DR capsule 60 mg  60 mg Oral Daily Vernelle Emerald, MD   60 mg at 12/29/19 1039  . enoxaparin (LOVENOX) injection 40 mg  40 mg Subcutaneous Q24H Shalhoub, Sherryll Burger, MD   40 mg at 12/28/19 1314  . feeding supplement (PRO-STAT SUGAR FREE 64) liquid 30 mL  30 mL Oral TID Georgette Shell, MD   30 mL at 12/29/19 1039  . gabapentin (NEURONTIN) capsule 600 mg  600 mg Oral TID Vernelle Emerald, MD   600 mg at 12/29/19 1039  . hydrOXYzine (ATARAX/VISTARIL) tablet 25 mg  25 mg Oral Q8H PRN Vernelle Emerald, MD   25 mg at 12/22/19 1349  . insulin aspart (novoLOG) injection 0-15 Units  0-15 Units Subcutaneous  TID AC & HS Shalhoub, Sherryll Burger, MD   2 Units at 12/25/19 [phone]  . metoprolol tartrate (LOPRESSOR) tablet 12.5 mg  12.5 mg Oral BID Vernelle Emerald, MD   12.5 mg at 12/29/19 1040  . metroNIDAZOLE (FLAGYL) tablet 500 mg  500 mg Oral Q8H Carlyle Basques, MD   500 mg at 12/29/19 0528  . morphine 2 MG/ML injection 2 mg  2 mg Intravenous Q4H PRN Shalhoub, Sherryll Burger, MD   2 mg at 12/29/19 1249   Or  . morphine 4 MG/ML injection 4 mg  4 mg Intravenous Q4H PRN Shalhoub, Sherryll Burger, MD   4 mg at 12/28/19 0055  . multivitamin (PROSIGHT) tablet 1 tablet  1 tablet Oral Daily Georgette Shell, MD   1 tablet at 12/29/19 1039  . nutrition supplement (JUVEN) (JUVEN) powder packet 1 packet  1 packet Oral BID BM Georgette Shell, MD   1 packet at 12/29/19 1039  . protein supplement (ENSURE MAX) liquid  11 oz Oral Daily Georgette Shell, MD   11 oz at 12/29/19 1051  . sodium chloride flush (NS) 0.9 % injection 10-40 mL  10-40 mL Intracatheter Q12H Georgette Shell, MD   10 mL at 12/29/19 1052  . sodium chloride flush (NS) 0.9 % injection 10-40 mL  10-40 mL Intracatheter PRN Georgette Shell, MD      . sodium chloride flush (NS) 0.9 % injection 3 mL  3 mL Intravenous Once Shalhoub, Sherryll Burger, MD         Discharge Medications: Please see discharge summary for a list of discharge medications.  Relevant Imaging Results:  Relevant Lab Results:   Additional Information [id number]  Marylynn Rigdon, Marjie Skiff, RN

## 2019-12-29 NOTE — Discharge Summary (Signed)
Physician Discharge Summary  [name] KGU:[id number] DOB: 04-Nov-1950 DOA: 12/21/2019  PCP: Cleophas Dunker, DO  Admit date: 12/21/2019 Discharge date: 12/29/2019  Admitted From: SNF Disposition: SNF  Recommendations for Outpatient Follow-up:  1. Follow up with PCP in 1-2 weeks 2. Please obtain BMP/CBC in one week.  Serial ESR CRP. 3. Please follow up on the following pending results:  Home Health: No Equipment/Devices: Left upper extremity midline  Discharge Condition: Stable CODE STATUS: DNR Diet recommendation: Cardiac low-sodium Brief/Interim Summary: 69 y.o.with type II DM, atrial fibrillation/flutter, bipolar disorder, anemia chronic venous ulceration wounds of bilateral legs with history of declining compression wrap for treatment as well as surgery follow-up wound care center who presented on 4/26/2021with worsening pain from ulcers of the bilateral legs at her facility.On presentation patient was afebrile, hemodynamically stable work-up notable for soft tissue swelling of left fibula on x-ray without signs of osteo and x-ray right fibula compatible with chronic or acute on chronic osteomyelitis. Wound cultures positive for Pseudomonas and Proteus mirabilis. Patient was placed on meropenem and doxycycline till she was seen by ID. Infectious disease recommend additional 4 days of cefepime 2 g IV every 8 hours plus oral Flagyl.  4 days should conclude on the evening of 01/01/2020.  Additionally, patient should continue with doxycycline 100 mg twice daily orally for 4 weeks treatment to conclude 01/27/2020. Serial CRP and ESR recommended to monitor interval changes infection.  Patient should reestablish care with wound care and should also be set up with [facility] as outpatient.  Hospital course complicated by patient declining much of medical treatment particularly compression stockings, seems to be okay with IV antibiotics, does not want to consider any  surgical interventions.   Discharge Diagnoses:  Principal Problem:   Bilateral cellulitis of lower leg Active Problems:   Atrial fibrillation (HCC)   Venous stasis ulcers of both lower extremities (HCC)   Controlled type 2 diabetes mellitus with circulatory disorder, without long-term current use of insulin (HCC)   Osteomyelitis of right fibula (HCC)   Pseudomonas aeruginosa infection    Discharge Instructions Continue with cefepime 2 g IV every 8+ oral Flagyl for additional 4 days to finish on evening of 01/01/2020.  Patient should continue doxycycline 100 mg twice daily to conclude 6-21.  She should have serial ESR and CRP to monitor her chronic osteomyelitis of the right fibula.  Patient here to establish care with wound clinic as well as [place] infectious disease.  Allergies as of 12/29/2019      Reactions   Codeine Other (See Comments)   "Gets high"   Tylenol [acetaminophen] Hives      Medication List    STOP taking these medications   Gerhardt's butt cream Crea   hydrOXYzine 25 MG tablet Commonly known as: ATARAX/VISTARIL     TAKE these medications   ceFEPIme 2 g in sodium chloride 0.9 % 100 mL Inject 2 g into the vein every 12 (twelve) hours.   doxycycline 100 MG tablet Commonly known as: VIBRA-TABS Take 1 tablet (100 mg total) by mouth 2 (two) times daily.   DULoxetine 60 MG capsule Commonly known as: CYMBALTA Take 60 mg by mouth daily. What changed: Another medication with the same name was removed. Continue taking this medication, and follow the directions you see here.   gabapentin 300 MG capsule Commonly known as: NEURONTIN Take 2 capsules (600 mg total) by mouth 3 (three) times daily.   metoprolol tartrate 25 MG tablet Commonly  known as: LOPRESSOR Take 0.5 tablets (12.5 mg total) by mouth 2 (two) times daily.   metroNIDAZOLE 500 MG tablet Commonly known as: FLAGYL Take 1 tablet (500 mg total) by mouth 3 (three) times daily.   multivitamin Tabs  tablet Take 1 tablet by mouth daily. Start taking on: Dec 30, 2019   naproxen sodium 220 MG tablet Commonly known as: ALEVE Take 220 mg by mouth daily as needed (pain).   nutrition supplement (JUVEN) Pack Take 1 packet by mouth 2 (two) times daily between meals.   traMADol 50 MG tablet Commonly known as: ULTRAM Take by mouth every 12 (twelve) hours as needed for moderate pain.            Durable Medical Equipment  (From admission, onward)         Start     Ordered   12/29/19 1423  DME Cane  Once     12/29/19 1423           Discharge Care Instructions  (From admission, onward)         Start     Ordered   12/29/19 0000  Change dressing on IV access line weekly and PRN  (Home infusion instructions - [facility] )     12/29/19 1423          Allergies  Allergen Reactions  . Codeine Other (See Comments)    "Gets high"  . Tylenol [Acetaminophen] Hives    Consultations:  Orthopedics, infectious disease   Procedures/Studies: DG Tibia/Fibula Left  Result Date: 12/22/2019 CLINICAL DATA:  Severe pain with open wounds on the lower extremity EXAM: LEFT TIBIA AND FIBULA - 2 VIEW COMPARISON:  08/12/2019 FINDINGS: Generalized soft tissue swelling and subcutaneous reticulation. The patient's wounds are likely seen as excavation along the lateral leg. There is no underlying fracture or erosion. No opaque foreign body or soft tissue emphysema. Osteopenia and osteoarthritis of the knee. IMPRESSION: Soft tissue swelling without acute osseous finding, opaque foreign body, or soft tissue emphysema. Electronically Signed   By: Monte Fantasia M.D.   On: 12/22/2019 06:47   DG Tibia/Fibula Right  Result Date: 12/22/2019 CLINICAL DATA:  Anterior leg wound EXAM: RIGHT TIBIA AND FIBULA - 2 VIEW COMPARISON:  08/12/2019 FINDINGS: There is periosteal new bone formation along the anterolateral surface of the fibula. No osseous abnormality of the tibia. No fracture or  dislocation. Large soft tissue wound anteriorly. IMPRESSION: Periosteal new bone formation along the anterolateral surface of the right fibula, compatible with chronic or acute-on-chronic osteomyelitis. Electronically Signed   By: Ulyses Jarred M.D.   On: 12/22/2019 02:07   DG Foot 2 Views Left  Result Date: 12/22/2019 CLINICAL DATA:  Severe pain with open wounds the lower extremity EXAM: LEFT FOOT - 2 VIEW COMPARISON:  None. FINDINGS: Osteopenia.  No fracture, dislocation malalignment, or erosion. Skin thickening and excavations to the medial and lateral lower leg. No soft tissue emphysema or opaque foreign body. Hammertoe deformities on the lateral view.  Hallux valgus. IMPRESSION: Soft tissue swelling without acute osseous finding. Electronically Signed   By: Monte Fantasia M.D.   On: 12/22/2019 06:48   DG Foot Complete Right  Result Date: 12/22/2019 CLINICAL DATA:  Soft tissue infection EXAM: RIGHT FOOT COMPLETE - 3+ VIEW COMPARISON:  None. FINDINGS: There is no evidence of fracture or dislocation. No osteolysis of the bones of the foot. There is metatarsus primus varus and hallux valgus. IMPRESSION: No radiographic evidence of osteomyelitis of the foot.  Electronically Signed   By: Ulyses Jarred M.D.   On: 12/22/2019 02:08   [place] EKG SITE RITE  Result Date: 12/28/2019 If Site Rite image not attached, placement could not be confirmed due to current cardiac rhythm.     Subjective: Patient no new complaints.  Excited to return to SNF.  Discharge Exam: Vitals:   12/29/19 0638 12/29/19 1236  BP: 130/61 123/90  Pulse: (!) 58 68  Resp: 16 18  Temp: 98.2 F (36.8 C) 98.3 F (36.8 C)  SpO2: 96% 98%   Vitals:   12/28/19 1320 12/28/19 2113 12/29/19 0638 12/29/19 1236  BP: 125/70 118/63 130/61 123/90  Pulse: 74 79 (!) 58 68  Resp:  '17 16 18  ' Temp:  98 F (36.7 C) 98.2 F (36.8 C) 98.3 F (36.8 C)  TempSrc:  Oral Oral   SpO2:  95% 96% 98%  Weight:      Height:        General: Pt is  alert, awake, not in acute distress Cardiovascular: RRR, S1/S2 +, no rubs, no gallops Respiratory: CTA bilaterally, no wheezing, no rhonchi Abdominal: Soft, NT, ND, bowel sounds + Extremities: no edema, no cyanosis.  Clean-based right lower extremity ulcer almost extending the entire length of her right tibia.  No foul smelling discharge noted.    The results of significant diagnostics from this hospitalization (including imaging, microbiology, ancillary and laboratory) are listed below for reference.

## 2019-12-29 NOTE — Progress Notes (Signed)
Spoke with Primary RN Hilton Cork re: PICC. Patient is okay to be discharged to SNF with a midline per Case Manager Arna Medici. Patient has LUA midline.

## 2020-01-12 ENCOUNTER — Encounter (HOSPITAL_BASED_OUTPATIENT_CLINIC_OR_DEPARTMENT_OTHER): Payer: Medicare Other | Admitting: Internal Medicine

## 2020-01-19 ENCOUNTER — Encounter (HOSPITAL_BASED_OUTPATIENT_CLINIC_OR_DEPARTMENT_OTHER): Payer: Medicare Other | Attending: Internal Medicine | Admitting: Internal Medicine

## 2020-01-19 DIAGNOSIS — I872 Venous insufficiency (chronic) (peripheral): Secondary | ICD-10-CM | POA: Diagnosis not present

## 2020-01-19 DIAGNOSIS — Z89511 Acquired absence of right leg below knee: Secondary | ICD-10-CM | POA: Insufficient documentation

## 2020-01-19 DIAGNOSIS — D649 Anemia, unspecified: Secondary | ICD-10-CM | POA: Diagnosis not present

## 2020-01-19 DIAGNOSIS — E11622 Type 2 diabetes mellitus with other skin ulcer: Secondary | ICD-10-CM | POA: Insufficient documentation

## 2020-01-19 DIAGNOSIS — I4891 Unspecified atrial fibrillation: Secondary | ICD-10-CM | POA: Diagnosis not present

## 2020-01-19 DIAGNOSIS — R609 Edema, unspecified: Secondary | ICD-10-CM | POA: Diagnosis not present

## 2020-01-19 DIAGNOSIS — F17210 Nicotine dependence, cigarettes, uncomplicated: Secondary | ICD-10-CM | POA: Diagnosis not present

## 2020-01-19 DIAGNOSIS — Z886 Allergy status to analgesic agent status: Secondary | ICD-10-CM | POA: Insufficient documentation

## 2020-01-19 DIAGNOSIS — L97212 Non-pressure chronic ulcer of right calf with fat layer exposed: Secondary | ICD-10-CM | POA: Insufficient documentation

## 2020-01-19 DIAGNOSIS — M1990 Unspecified osteoarthritis, unspecified site: Secondary | ICD-10-CM | POA: Insufficient documentation

## 2020-01-19 DIAGNOSIS — L97222 Non-pressure chronic ulcer of left calf with fat layer exposed: Secondary | ICD-10-CM | POA: Diagnosis not present

## 2020-01-19 DIAGNOSIS — Z885 Allergy status to narcotic agent status: Secondary | ICD-10-CM | POA: Insufficient documentation

## 2020-01-19 DIAGNOSIS — F319 Bipolar disorder, unspecified: Secondary | ICD-10-CM | POA: Insufficient documentation

## 2020-01-19 DIAGNOSIS — I890 Lymphedema, not elsewhere classified: Secondary | ICD-10-CM | POA: Insufficient documentation

## 2020-01-19 DIAGNOSIS — I10 Essential (primary) hypertension: Secondary | ICD-10-CM | POA: Insufficient documentation

## 2020-01-19 NOTE — Progress Notes (Addendum)
KACIA, HALLEY ([id number]) Visit Report for 01/19/2020 Allergy List Details Patient Name: Date of Service: [name], [name]. 01/19/2020 9:00 A Phillips Medical Record Number: [id number] Patient Account Number: [id number] Date of Birth/Sex: Treating RN: Jan 13, 1951 (69 y.o. Tommye Standard Primary Care Markon Jares: Wendee Copp, [facility] Other Clinician: Referring Kyrillos Adams: Treating Hester Forget/Extender: Cassandria Anger MECCA RIELLO, BA ILEY Weeks in Treatment: 0 Allergies Active Allergies codeine Tylenol Reaction: hives Allergy Notes Electronic Signature(s) Signed: 01/19/2020 5:28:01 PM By: Zenaida Deed RN, BSN Entered By: Zenaida Deed on 01/19/2020 09:12:55 -------------------------------------------------------------------------------- Arrival Information Details Patient Name: Date of Service: [name]. 01/19/2020 9:00 A Phillips Medical Record Number: [id number] Patient Account Number: [id number] Date of Birth/Sex: Treating RN: 09-05-1950 (69 y.o. Tommye Standard Primary Care Shajuana Mclucas: Wendee Copp, [facility] Hero Other Clinician: Referring Paislea Hatton: Treating Mahealani Sulak/Extender: Cassandria Anger MECCA RIELLO, BA ILEY Weeks in Treatment: 0 Visit Information Patient Arrived: Wheel Chair Arrival Time: 09:03 Accompanied By: facility staff Transfer Assistance: EasyPivot Patient Lift Patient Identification Verified: Yes Secondary Verification Process Completed: Yes Patient Requires Transmission-Based Precautions: No Patient Has Alerts: No History Since Last Visit Any new allergies or adverse reactions: No Had a fall or experienced change in activities of daily living that may affect risk of falls: Yes Signs or symptoms of abuse/neglect since last visito No Hospitalized since last visit: Yes Has Dressing in Place as Prescribed: Yes Has Compression in Place as Prescribed: No Electronic Signature(s) Signed: 01/19/2020 5:28:01 PM By: Zenaida Deed RN, BSN Entered By: Zenaida Deed on  01/19/2020 09:10:56 -------------------------------------------------------------------------------- Clinic Level of Care Assessment Details Patient Name: Date of Service: [name], [name]. 01/19/2020 9:00 A Phillips Medical Record Number: [id number] Patient Account Number: [id number] Date of Birth/Sex: Treating RN: [birth year] (69 y.o. Freddy Finner Primary Care Tamel Abel: Wendee Copp, [facility] Hero Other Clinician: Referring Harold Mattes: Treating Pierre Dellarocco/Extender: Cassandria Anger MECCA RIELLO, BA ILEY Weeks in Treatment: 0 Clinic Level of Care Assessment Items TOOL 1 Quantity Score X- 1 0 Use when EandM and Procedure is performed on INITIAL visit ASSESSMENTS - Nursing Assessment / Reassessment X- 1 20 General Physical Exam (combine w/ comprehensive assessment (listed just below) when performed on new pt. evals) X- 1 25 Comprehensive Assessment (HX, ROS, Risk Assessments, Wounds Hx, etc.) ASSESSMENTS - Wound and Skin Assessment / Reassessment []  - 0 Dermatologic / Skin Assessment (not related to wound area) ASSESSMENTS - Ostomy and/or Continence Assessment and Care []  - 0 Incontinence Assessment and Management []  - 0 Ostomy Care Assessment and Management (repouching, etc.) PROCESS - Coordination of Care X - Simple Patient / Family Education for ongoing care 1 15 []  - 0 Complex (extensive) Patient / Family Education for ongoing care X- 1 10 Staff obtains , Records, T Results / Process Orders est []  - 0 Staff telephones HHA, Nursing Homes / Clarify orders / etc []  - 0 Routine Transfer to another Facility (non-emergent condition) []  - 0 Routine Hospital Admission (non-emergent condition) X- 1 15 New Admissions / / Ordering NPWT Apligraf, etc. , []  - 0 Emergency Hospital Admission (emergent condition) PROCESS - Special Needs []  - 0 Pediatric / Minor Patient Management []  - 0 Isolation Patient Management []  - 0 Hearing / Language / Visual special  needs []  - 0 Assessment of Community assistance (transportation, D/C planning, etc.) []  - 0 Additional assistance / Altered mentation []  - 0 Support Surface(s) Assessment (bed, cushion, seat, etc.) INTERVENTIONS - Miscellaneous []  - 0 External ear exam []  - 0 Patient Transfer (multiple staff /  Hoyer Lift / Similar devices) []  - 0 Simple Staple / Suture removal (25 or less) []  - 0 Complex Staple / Suture removal (26 or more) []  - 0 Hypo/Hyperglycemic Management (do not check if billed separately) X- 1 15 Ankle / Brachial Index (ABI) - do not check if billed separately Has the patient been seen at the hospital within the last three years: Yes Total Score: 100 Level Of Care: New/Established - Level 3 Electronic Signature(s) Signed: 01/19/2020 5:17:39 PM By: RN Signed: 01/19/2020 5:17:39 PM By: 01/21/2020 RN Entered By: Yevonne Pax on 01/19/2020 10:31:50 -------------------------------------------------------------------------------- Compression Therapy Details Patient Name: Date of Service: Yevonne Pax. 01/19/2020 9:00 A Phillips Medical Record Number: 01/21/2020 Patient Account Number: [name] Date of Birth/Sex: Treating RN: 1951-02-11 (69 y.o. [id number] Primary Care Isatou Agredano: 12/02/1950, 78 Other Clinician: Referring Raksha Wolfgang: Treating Cliffton Spradley/Extender: Freddy Finner MECCA RIELLO, BA ILEY Weeks in Treatment: 0 Compression Therapy Performed for Wound Assessment: Wound #10 Right,Circumferential Lower Leg Performed By: Clinician Wendee Copp, RN Compression Type: Double Layer Post Procedure Diagnosis Same as Pre-procedure Electronic Signature(s) Signed: 01/19/2020 5:17:39 PM By: Cassandria Anger RN Entered By: Yevonne Pax on 01/19/2020 10:32:16 -------------------------------------------------------------------------------- Compression Therapy Details Patient Name: Date of Service: [name], [name] 01/19/2020 9:00 A Phillips Medical Record Number:  01/21/2020 Patient Account Number: [name] Date of Birth/Sex: Treating RN: 09-17-1950 (69 y.o. [id number] Primary Care Oisin Yoakum: 12/02/1950, 78 Other Clinician: Referring Kenton Fortin: Treating Toluwanimi Radebaugh/Extender: Freddy Finner MECCA RIELLO, BA ILEY Weeks in Treatment: 0 Compression Therapy Performed for Wound Assessment: Wound #7 Left,Medial Malleolus Performed By: Clinician Wendee Copp, RN Compression Type: Double Layer Post Procedure Diagnosis Same as Pre-procedure Electronic Signature(s) Signed: 01/19/2020 5:17:39 PM By: Cassandria Anger RN Entered By: Yevonne Pax on 01/19/2020 10:32:16 -------------------------------------------------------------------------------- Compression Therapy Details Patient Name: Date of Service: [name], [name] 01/19/2020 9:00 A Phillips Medical Record Number: 01/21/2020 Patient Account Number: [name] Date of Birth/Sex: Treating RN: [birth year] (69 y.o. [id number] Primary Care Travian Kerner: 12/02/1950, 78 Other Clinician: Referring Carlette Palmatier: Treating Ryder Man/Extender: Freddy Finner MECCA RIELLO, BA ILEY Weeks in Treatment: 0 Compression Therapy Performed for Wound Assessment: Wound #8 Left,Posterior Lower Leg Performed By: Clinician Wendee Copp, RN Compression Type: Double Layer Post Procedure Diagnosis Same as Pre-procedure Electronic Signature(s) Signed: 01/19/2020 5:17:39 PM By: Cassandria Anger RN Entered By: Yevonne Pax on 01/19/2020 10:32:16 -------------------------------------------------------------------------------- Compression Therapy Details Patient Name: Date of Service: [name], [name] 01/19/2020 9:00 A Phillips Medical Record Number: 01/21/2020 Patient Account Number: [name] Date of Birth/Sex: Treating RN: [birth year] (69 y.o. [id number] Primary Care Britt Theard: 12/02/1950, 78 Other Clinician: Referring Daesha Insco: Treating Charlean Carneal/Extender: Freddy Finner MECCA RIELLO, BA ILEY Weeks in Treatment:  0 Compression Therapy Performed for Wound Assessment: Wound #9 Left,Lateral Ankle Performed By: Clinician Wendee Copp, RN Compression Type: Double Layer Post Procedure Diagnosis Same as Pre-procedure Electronic Signature(s) Signed: 01/19/2020 5:17:39 PM By: Cassandria Anger RN Entered By: Yevonne Pax on 01/19/2020 10:32:17 -------------------------------------------------------------------------------- Encounter Discharge Information Details Patient Name: Date of Service: Yevonne Pax. 01/19/2020 9:00 A Phillips Medical Record Number: 01/21/2020 Patient Account Number: [name] Date of Birth/Sex: Treating RN: 11-02-1950 (69 y.o. [id number] Primary Care Hadi Dubin: 12/02/1950, 78 Other Clinician: Referring Jantz Main: Treating Rj Pedrosa/Extender: Harvest Dark MECCA RIELLO, BA ILEY Weeks in Treatment: 0 Encounter Discharge Information Items Discharge Condition: Stable Ambulatory Status: Wheelchair Discharge Destination: Skilled Nursing Facility Telephoned: No Orders Sent: Yes Transportation: Other Accompanied By: caregiver Schedule  Follow-up Appointment: Yes Clinical Summary of Care: Patient Declined Electronic Signature(s) Signed: 01/19/2020 5:12:39 PM By: Cherylin Mylarwiggins, Shannon Entered By: Cherylin Mylarwiggins, Shannon on 01/19/2020 10:43:28 -------------------------------------------------------------------------------- Lower Extremity Assessment Details Patient Name: Date of Service: Caroline HemanWELLS, [name]. 01/19/2020 9:00 A Phillips Medical Record Number: [id number] Patient Account Number: [id number] Date of Birth/Sex: Treating RN: 10/20/1950 (69 y.o. Tommye StandardF) Boehlein, Linda Primary Care Mouhamad Teed: Wendee CoppMECCA RIELLO, Whitman HeroBA ILEY Other Clinician: Referring Lenwood Balsam: Treating Keyshia Orwick/Extender: Cassandria AngerMadduri, Murthy MECCA RIELLO, BA ILEY Weeks in Treatment: 0 Edema Assessment Assessed: [Left: No] [Right: No] Edema: [Left: Yes] [Right: Yes] Calf Left: Right: Point of Measurement: cm From Medial Instep 35.7 cm  40.7 cm Ankle Left: Right: Point of Measurement: cm From Medial Instep 25.2 cm 26.2 cm Vascular Assessment Pulses: Dorsalis Pedis Palpable: [Left:Yes] [Right:Yes] Electronic Signature(s) Signed: 01/19/2020 5:28:01 PM By: Zenaida DeedBoehlein, Linda RN, BSN Entered By: Zenaida DeedBoehlein, Linda on 01/19/2020 09:39:40 -------------------------------------------------------------------------------- Multi-Disciplinary Care Plan Details Patient Name: Date of Service: Caroline HemanWELLS, [name]. 01/19/2020 9:00 A Phillips Medical Record Number: [id number] Patient Account Number: [id number] Date of Birth/Sex: Treating RN: 08/29/1950 (69 y.o. Freddy FinnerF) Epps, Carrie Primary Care Deep Bonawitz: Wendee CoppMECCA RIELLO, [facility] Other Clinician: Referring Karli Wickizer: Treating Casanova Schurman/Extender: Cassandria AngerMadduri, Murthy MECCA RIELLO, BA ILEY Weeks in Treatment: 0 Active Inactive Electronic Signature(s) Signed: 03/18/2020 5:44:19 PM By: Yevonne PaxEpps, Carrie RN Previous Signature: 01/19/2020 5:17:39 PM Version By: Yevonne PaxEpps, Carrie RN Entered By: Yevonne PaxEpps, Carrie on 03/14/2020 16:01:17 -------------------------------------------------------------------------------- Pain Assessment Details Patient Name: Date of Service: Caroline HemanWELLS, [name]. 01/19/2020 9:00 A Phillips Medical Record Number: [id number] Patient Account Number: [id number] Date of Birth/Sex: Treating RN: 11/14/1950 (69 y.o. Tommye StandardF) Boehlein, Linda Primary Care Ayham Word: Wendee CoppMECCA RIELLO, Whitman HeroBA ILEY Other Clinician: Referring Jamaar Howes: Treating Birgit Nowling/Extender: Cassandria AngerMadduri, Murthy MECCA RIELLO, BA ILEY Weeks in Treatment: 0 Active Problems Location of Pain Severity and Description of Pain Patient Has Paino Yes Site Locations Pain Location: Pain in Ulcers With Dressing Change: Yes Duration of the Pain. Constant / Intermittento Constant Rate the pain. Current Pain Level: 5 Worst Pain Level: 10 Least Pain Level: 4 Character of Pain Describe the Pain: Burning, Tender, Other: stinging Pain Management and Medication Current Pain  Management: Medication: Yes Is the Current Pain Management Adequate: Adequate How does your wound impact your activities of daily livingo Sleep: Yes Bathing: No Appetite: No Relationship With Others: No Bladder Continence: No Emotions: Yes Bowel Continence: No Hobbies: Yes Toileting: No Dressing: No Electronic Signature(s) Signed: 01/19/2020 5:28:01 PM By: Zenaida DeedBoehlein, Linda RN, BSN Entered By: Zenaida DeedBoehlein, Linda on 01/19/2020 09:58:10 -------------------------------------------------------------------------------- Patient/Caregiver Education Details Patient Name: Date of Service: [name], [name]. 5/25/2021andnbsp9:00 A Phillips Medical Record Number: [id number] Patient Account Number: [id number] Date of Birth/Gender: Treating RN: 04/28/1951 (69 y.o. Freddy FinnerF) Epps, Carrie Primary Care Physician: Wendee CoppMECCA RIELLO, [facility] Other Clinician: Referring Physician: Treating Physician/Extender: Charlett BlakeMadduri, Murthy MECCA RIELLO, BA ILEY Weeks in Treatment: 0 Education Assessment Education Provided To: Patient Education Topics Provided Wound/Skin Impairment: Methods: Explain/Verbal Responses: State content correctly Electronic Signature(s) Signed: 01/19/2020 5:17:39 PM By: Yevonne PaxEpps, Carrie RN Entered By: Yevonne PaxEpps, Carrie on 01/19/2020 10:14:32 -------------------------------------------------------------------------------- Wound Assessment Details Patient Name: Date of Service: Caroline HemanWELLS, [name]. 01/19/2020 9:00 A Phillips Medical Record Number: [id number] Patient Account Number: [id number] Date of Birth/Sex: Treating RN: 05/05/1951 (69 y.o. Tommye StandardF) Boehlein, Linda Primary Care Giulietta Prokop: Wendee CoppMECCA RIELLO, Whitman HeroBA ILEY Other Clinician: Referring Janayla Marik: Treating Aliegha Paullin/Extender: Cassandria AngerMadduri, Murthy MECCA RIELLO, BA ILEY Weeks in Treatment: 0 Wound Status Wound Number: 10 Primary Venous Leg Ulcer Etiology: Wound Location: Right, Circumferential Lower Leg Secondary Infection - not elsewhere classified  Wounding Event: Gradually  Appeared Etiology: Date Acquired: 08/28/2015 Wound Open Weeks Of Treatment: 0 Status: Clustered Wound: No Comorbid Anemia, Arrhythmia, Peripheral Venous Disease, Type II History: Diabetes, Osteoarthritis, Osteomyelitis Photos Photo Uploaded By: Benjaman Kindler on 01/21/2020 08:47:16 Wound Measurements Length: (cm) 18.5 Width: (cm) 25 Depth: (cm) 0.2 Area: (cm) 363.247 Volume: (cm) 72.649 % Reduction in Area: % Reduction in Volume: Epithelialization: Small (1-33%) Tunneling: No Undermining: No Wound Description Classification: Full Thickness With Exposed Support Structures Wound Margin: Flat and Intact Exudate Amount: Large Exudate Type: Serous Exudate Color: amber Foul Odor After Cleansing: No Slough/Fibrino Yes Wound Bed Granulation Amount: Small (1-33%) Exposed Structure Granulation Quality: Pink Fascia Exposed: No Necrotic Amount: Large (67-100%) Fat Layer (Subcutaneous Tissue) Exposed: Yes Necrotic Quality: Adherent Slough Tendon Exposed: No Muscle Exposed: Yes Necrosis of Muscle: Yes Joint Exposed: No Bone Exposed: No Electronic Signature(s) Signed: 01/19/2020 5:28:01 PM By: Zenaida Deed RN, BSN Entered By: Zenaida Deed on 01/19/2020 09:57:18 -------------------------------------------------------------------------------- Wound Assessment Details Patient Name: Date of Service: [name]. 01/19/2020 9:00 A Phillips Medical Record Number: [id number] Patient Account Number: [id number] Date of Birth/Sex: Treating RN: 01/09/1951 (69 y.o. Tommye Standard Primary Care Genean Adamski: Wendee Copp, Whitman Hero Other Clinician: Referring Ysabella Babiarz: Treating Zaniel Marineau/Extender: Cassandria Anger MECCA RIELLO, BA ILEY Weeks in Treatment: 0 Wound Status Wound Number: 7 Primary Venous Leg Ulcer Etiology: Wound Location: Left, Medial Malleolus Wound Open Wounding Event: Gradually Appeared Status: Date Acquired: 08/28/2015 Comorbid Anemia, Arrhythmia, Peripheral Venous  Disease, Type II Weeks Of Treatment: 0 History: Diabetes, Osteoarthritis, Osteomyelitis Clustered Wound: No Photos Photo Uploaded By: Benjaman Kindler on 01/21/2020 08:47:17 Wound Measurements Length: (cm) 5.4 Width: (cm) 3 Depth: (cm) 0.1 Area: (cm) 12.723 Volume: (cm) 1.272 % Reduction in Area: % Reduction in Volume: Epithelialization: None Tunneling: No Undermining: No Wound Description Classification: Full Thickness Without Exposed Support Structures Wound Margin: Flat and Intact Exudate Amount: Large Exudate Type: Purulent Exudate Color: yellow, brown, green Foul Odor After Cleansing: No Slough/Fibrino Yes Wound Bed Granulation Amount: Small (1-33%) Exposed Structure Granulation Quality: Pink Fascia Exposed: No Necrotic Amount: Large (67-100%) Fat Layer (Subcutaneous Tissue) Exposed: Yes Necrotic Quality: Adherent Slough Tendon Exposed: No Muscle Exposed: No Joint Exposed: No Bone Exposed: No Electronic Signature(s) Signed: 01/19/2020 5:28:01 PM By: Zenaida Deed RN, BSN Entered By: Zenaida Deed on 01/19/2020 09:52:30 -------------------------------------------------------------------------------- Wound Assessment Details Patient Name: Date of Service: [name]. 01/19/2020 9:00 A Phillips Medical Record Number: [id number] Patient Account Number: [id number] Date of Birth/Sex: Treating RN: [birth year] (69 y.o. Tommye Standard Primary Care Sanav Remer: Wendee Copp, Whitman Hero Other Clinician: Referring Timesha Cervantez: Treating Pradeep Beaubrun/Extender: Cassandria Anger MECCA RIELLO, BA ILEY Weeks in Treatment: 0 Wound Status Wound Number: 8 Primary Venous Leg Ulcer Etiology: Wound Location: Left, Posterior Lower Leg Wound Open Wounding Event: Gradually Appeared Status: Date Acquired: 08/28/2015 Comorbid Anemia, Arrhythmia, Peripheral Venous Disease, Type II Weeks Of Treatment: 0 History: Diabetes, Osteoarthritis, Osteomyelitis Clustered Wound: No Photos Photo  Uploaded By: Benjaman Kindler on 01/21/2020 08:46:47 Wound Measurements Length: (cm) 6.8 Width: (cm) 7 Depth: (cm) 0.1 Area: (cm) 37.385 Volume: (cm) 3.738 % Reduction in Area: % Reduction in Volume: Epithelialization: None Tunneling: No Undermining: No Wound Description Classification: Full Thickness Without Exposed Support Structures Wound Margin: Flat and Intact Exudate Amount: Large Exudate Type: Purulent Exudate Color: yellow, brown, green Foul Odor After Cleansing: No Slough/Fibrino Yes Wound Bed Granulation Amount: Small (1-33%) Exposed Structure Granulation Quality: Pink Fascia Exposed: No Necrotic Amount: Large (67-100%) Fat Layer (Subcutaneous Tissue) Exposed: Yes Necrotic  Quality: Adherent Slough Tendon Exposed: No Muscle Exposed: No Joint Exposed: No Bone Exposed: No Electronic Signature(s) Signed: 01/19/2020 5:28:01 PM By: Baruch Gouty RN, BSN Entered By: Baruch Gouty on 01/19/2020 09:53:57 -------------------------------------------------------------------------------- Wound Assessment Details Patient Name: Date of Service: [name]. 01/19/2020 9:00 A Phillips Medical Record Number: [id number] Patient Account Number: [id number] Date of Birth/Sex: Treating RN: [birth year] (69 y.o. Elam Dutch Primary Care Tameia Rafferty: Fabiola Backer, Tina Griffiths Other Clinician: Referring Doyce Saling: Treating Cornelis Kluver/Extender: Tobi Bastos MECCA RIELLO, BA ILEY Weeks in Treatment: 0 Wound Status Wound Number: 9 Primary Venous Leg Ulcer Etiology: Wound Location: Left, Lateral Ankle Wound Open Wounding Event: Gradually Appeared Status: Date Acquired: 08/28/2015 Comorbid Anemia, Arrhythmia, Peripheral Venous Disease, Type II Weeks Of Treatment: 0 History: Diabetes, Osteoarthritis, Osteomyelitis Clustered Wound: Yes Photos Photo Uploaded By: Mikeal Hawthorne on 01/21/2020 08:46:47 Wound Measurements Length: (cm) 0.9 Width: (cm) 3.5 Depth: (cm) 0.1 Area: (cm)  2.474 Volume: (cm) 0.247 % Reduction in Area: % Reduction in Volume: Epithelialization: Small (1-33%) Tunneling: No Undermining: No Wound Description Classification: Full Thickness Without Exposed Support Structures Wound Margin: Flat and Intact Exudate Amount: Small Exudate Type: Serous Exudate Color: amber Foul Odor After Cleansing: No Slough/Fibrino Yes Wound Bed Granulation Amount: Medium (34-66%) Exposed Structure Granulation Quality: Pink Fascia Exposed: No Necrotic Amount: Medium (34-66%) Fat Layer (Subcutaneous Tissue) Exposed: Yes Necrotic Quality: Adherent Slough Tendon Exposed: No Muscle Exposed: No Joint Exposed: No Bone Exposed: No Electronic Signature(s) Signed: 01/19/2020 5:28:01 PM By: Baruch Gouty RN, BSN Entered By: Baruch Gouty on 01/19/2020 09:55:41 -------------------------------------------------------------------------------- Norman Details Patient Name: Date of Service: [name]. 01/19/2020 9:00 A Phillips Medical Record Number: [id number] Patient Account Number: [id number] Date of Birth/Sex: Treating RN: 11-15-1950 (69 y.o. Elam Dutch Primary Care Sol Englert: Fabiola Backer, Tina Griffiths Other Clinician: Referring Anntoinette Haefele: Treating Wetzel Meester/Extender: Tobi Bastos MECCA RIELLO, BA ILEY Weeks in Treatment: 0 Vital Signs Time Taken: 09:11 Temperature (F): 98.3 Height (in): 66 Pulse (bpm): 87 Source: Stated Respiratory Rate (breaths/min): 18 Weight (lbs): 160 Blood Pressure (mmHg): 119/64 Body Mass Index (BMI): 25.8 Reference Range: 80 - 120 mg / dl Electronic Signature(s) Signed: 01/19/2020 5:28:01 PM By: Baruch Gouty RN, BSN Entered By: Baruch Gouty on 01/19/2020 09:12:01

## 2020-01-19 NOTE — Progress Notes (Signed)
MAEBRY, OBRIEN ([id number]) Visit Report for 01/19/2020 Abuse/Suicide Risk Screen Details Patient Name: Date of Service: [name], [name]. 01/19/2020 9:00 A M Medical Record Number: [id number] Patient Account Number: [id number] Date of Birth/Sex: Treating RN: 1950-11-29 (69 y.o. Elam Dutch Primary Care Macallan Ord: Fabiola Backer, Tina Griffiths Other Clinician: Referring Kamara Allan: Treating Latria Mccarron/Extender: Tobi Bastos MECCA RIELLO, BA ILEY Weeks in Treatment: 0 Abuse/Suicide Risk Screen Items Answer ABUSE RISK SCREEN: Has anyone close to you tried to hurt or harm you recentlyo No Do you feel uncomfortable with anyone in your familyo No Has anyone forced you do things that you didnt want to doo No Electronic Signature(s) Signed: 01/19/2020 5:28:01 PM By: Baruch Gouty RN, BSN Entered By: Baruch Gouty on 01/19/2020 09:20:22 -------------------------------------------------------------------------------- Activities of Daily Living Details Patient Name: Date of Service: [name], [name]. 01/19/2020 9:00 A M Medical Record Number: [id number] Patient Account Number: [id number] Date of Birth/Sex: Treating RN: Jan 23, 1951 (70 y.o. Elam Dutch Primary Care Shaiann Mcmanamon: Fabiola Backer, Tina Griffiths Other Clinician: Referring Jewelia Bocchino: Treating Danee Soller/Extender: Tobi Bastos MECCA RIELLO, BA ILEY Weeks in Treatment: 0 Activities of Daily Living Items Answer Activities of Daily Living (Please select one for each item) Drive Automobile Not Able T Medications ake Need Assistance Use T elephone Completely Able Care for Appearance Completely Able Use T oilet Completely Able Bath / Shower Need Assistance Dress Self Completely Able Feed Self Completely Able Walk Need Assistance Get In / Out Bed Need Assistance Housework Need Assistance Prepare Meals Need Assistance Handle Money Need Assistance Shop for Self Need Assistance Electronic Signature(s) Signed: 01/19/2020 5:28:01 PM By:  Baruch Gouty RN, BSN Entered By: Baruch Gouty on 01/19/2020 09:21:16 -------------------------------------------------------------------------------- Education Screening Details Patient Name: Date of Service: Caroline Netters. 01/19/2020 9:00 A M Medical Record Number: [id number] Patient Account Number: [id number] Date of Birth/Sex: Treating RN: 04/07/1951 (69 y.o. Elam Dutch Primary Care Kirin Pastorino: Fabiola Backer, Tina Griffiths Other Clinician: Referring Neyland Pettengill: Treating Jaziya Obarr/Extender: Thurnell Garbe, BA ILEY Weeks in Treatment: 0 Primary Learner Assessed: Patient Learning Preferences/Education Level/Primary Language Learning Preference: Explanation, Demonstration, Printed Material Highest Education Level: High School Preferred Language: English Cognitive Barrier Language Barrier: No Translator Needed: No Memory Deficit: No Emotional Barrier: No Cultural/Religious Beliefs Affecting Medical Care: No Physical Barrier Impaired Vision: Yes Glasses Impaired Hearing: No Decreased Hand dexterity: No Knowledge/Comprehension Knowledge Level: High Comprehension Level: High Ability to understand written instructions: High Ability to understand verbal instructions: High Motivation Anxiety Level: Calm Cooperation: Cooperative Education Importance: Acknowledges Need Interest in Health Problems: Asks Questions Perception: Coherent Willingness to Engage in Self-Management High Activities: Readiness to Engage in Self-Management High Activities: Electronic Signature(s) Signed: 01/19/2020 5:28:01 PM By: Baruch Gouty RN, BSN Entered By: Baruch Gouty on 01/19/2020 09:21:55 -------------------------------------------------------------------------------- Fall Risk Assessment Details Patient Name: Date of Service: Caroline Netters. 01/19/2020 9:00 A M Medical Record Number: [id number] Patient Account Number: [id number] Date of Birth/Sex: Treating RN: March 03, 1951  (69 y.o. Elam Dutch Primary Care Skylar Flynt: Fabiola Backer, Tina Griffiths Other Clinician: Referring Suhas Estis: Treating Parminder Cupples/Extender: Tobi Bastos MECCA RIELLO, BA ILEY Weeks in Treatment: 0 Fall Risk Assessment Items Have you had 2 or more falls in the last 12 monthso 0 Yes Have you had any fall that resulted in injury in the last 12 monthso 0 No FALLS RISK SCREEN History of falling - immediate or within 3 months 25 Yes Secondary diagnosis (Do you have 2 or more medical diagnoseso) 0 No Ambulatory aid None/bed rest/wheelchair/nurse 0 Yes Crutches/cane/walker 0  No Furniture 0 No Intravenous therapy Access/Saline/Heparin Lock 0 No Gait/Transferring Normal/ bed rest/ wheelchair 0 Yes Weak (short steps with or without shuffle, stooped but able to lift head while walking, may seek 0 No support from furniture) Impaired (short steps with shuffle, may have difficulty arising from chair, head down, impaired 0 No balance) Mental Status Oriented to own ability 0 Yes Electronic Signature(s) Signed: 01/19/2020 5:28:01 PM By: Zenaida Deed RN, BSN Entered By: Zenaida Deed on 01/19/2020 09:22:14 -------------------------------------------------------------------------------- Foot Assessment Details Patient Name: Date of Service: [name]. 01/19/2020 9:00 A M Medical Record Number: [id number] Patient Account Number: [id number] Date of Birth/Sex: Treating RN: 03/21/1951 (69 y.o. Tommye Standard Primary Care Crissa Sowder: Wendee Copp, Whitman Hero Other Clinician: Referring Heaven Meeker: Treating Tyonna Talerico/Extender: Cassandria Anger MECCA RIELLO, BA ILEY Weeks in Treatment: 0 Foot Assessment Items Site Locations + = Sensation present, - = Sensation absent, C = Callus, U = Ulcer R = Redness, W = Warmth, M = Maceration, PU = Pre-ulcerative lesion F = Fissure, S = Swelling, D = Dryness Assessment Right: Left: Other Deformity: No No Prior Foot Ulcer: No No Prior Amputation: No  No Charcot Joint: No No Ambulatory Status: Ambulatory With Help Assistance Device: Walker Gait: Surveyor, mining) Signed: 01/19/2020 5:28:01 PM By: Zenaida Deed RN, BSN Entered By: Zenaida Deed on 01/19/2020 09:23:11 -------------------------------------------------------------------------------- Nutrition Risk Screening Details Patient Name: Date of Service: [name]. 01/19/2020 9:00 A M Medical Record Number: [id number] Patient Account Number: [id number] Date of Birth/Sex: Treating RN: 10/05/1950 (69 y.o. Tommye Standard Primary Care Ravenna Legore: Wendee Copp, Whitman Hero Other Clinician: Referring Blessed Girdner: Treating Tarae Wooden/Extender: Cassandria Anger MECCA RIELLO, BA ILEY Weeks in Treatment: 0 Height (in): 66 Weight (lbs): 160 Body Mass Index (BMI): 25.8 Nutrition Risk Screening Items Score Screening NUTRITION RISK SCREEN: I have an illness or condition that made me change the kind and/or amount of food I eat 0 No I eat fewer than two meals per day 0 No I eat few fruits and vegetables, or milk products 0 No I have three or more drinks of beer, liquor or wine almost every day 0 No I have tooth or mouth problems that make it hard for me to eat 0 No I don't always have enough money to buy the food I need 0 No I eat alone most of the time 1 Yes I take three or more different prescribed or over-the-counter drugs a day 1 Yes Without wanting to, I have lost or gained 10 pounds in the last six months 0 No I am not always physically able to shop, cook and/or feed myself 0 No Nutrition Protocols Good Risk Protocol 0 No interventions needed Moderate Risk Protocol High Risk Proctocol Risk Level: Good Risk Score: 2 Electronic Signature(s) Signed: 01/19/2020 5:28:01 PM By: Zenaida Deed RN, BSN Entered By: Zenaida Deed on 01/19/2020 09:22:41

## 2020-01-20 ENCOUNTER — Ambulatory Visit (INDEPENDENT_AMBULATORY_CARE_PROVIDER_SITE_OTHER): Payer: Medicare Other | Admitting: Infectious Disease

## 2020-01-20 ENCOUNTER — Other Ambulatory Visit: Payer: Self-pay

## 2020-01-20 ENCOUNTER — Encounter: Payer: Self-pay | Admitting: Infectious Disease

## 2020-01-20 DIAGNOSIS — Z7409 Other reduced mobility: Secondary | ICD-10-CM

## 2020-01-20 DIAGNOSIS — M86361 Chronic multifocal osteomyelitis, right tibia and fibula: Secondary | ICD-10-CM | POA: Diagnosis present

## 2020-01-20 DIAGNOSIS — F17200 Nicotine dependence, unspecified, uncomplicated: Secondary | ICD-10-CM

## 2020-01-20 DIAGNOSIS — E1165 Type 2 diabetes mellitus with hyperglycemia: Secondary | ICD-10-CM

## 2020-01-20 DIAGNOSIS — A498 Other bacterial infections of unspecified site: Secondary | ICD-10-CM

## 2020-01-20 NOTE — Progress Notes (Signed)
Subjective:  Chief complaint continued pain and drainage from her right ulcer  Patient ID: [name], female    DOB: [birth year], 69 y.o.   MRN: [id number]  HPI   69 y.o. female with history of bipolar disorder, dm2, chronic venous stasis with lymphedema, and chronic wounds, she was admitted on 12/22/2019- for worsening pain, swelling, and foul odor, pictures from her admission show that she does have some erythema surrounding eschar with swelling. Also has exudate in the wound bed. Wound care  recommended aqualcell plus wrapping up legs to provide compression. She did not want to pursue any surgical debridement. She states that her wounds look better since starting iv abtx. She is on meropenem plus oral doxycycline. Her woudn cx (superficial) were polymicrobial including PsA and proteus. She was seen by my partner Dr. Baxter Flattery and continued patient on cefepime and metronidazole for 4 days and then continue doxycycline for a month with follow-up arranged in our clinic.  Today when she appeared in clinic she stated that her right ulcer is not improving whatsoever does not seem to be deteriorating but is clearly not improving.  She she said to me "I need to have this 1 amputated"  Her left leg she believes is healing but she is quite tender there as well where she has ulcers though not as dramatic as on the right side.  She is been seeing Dr. Dellia Nims for wound care as well.  She currently residing in [place].  She says that she has cut down her tobacco use.   Past Medical History:  Diagnosis Date  . Arthritis   . Hypoglycemia   . Pneumonia, pneumococcal [facility])     Past Surgical History:  Procedure Laterality Date  . ABDOMINAL HYSTERECTOMY    . CESAREAN SECTION      Family History  Family history unknown: Yes      Social History   Socioeconomic History  . Marital status: Single    Spouse name: Not on file  . Number of children: Not on file  . Years of education: Not on file  .  Highest education level: Not on file  Occupational History  . Not on file  Tobacco Use  . Smoking status: Light Tobacco Smoker    Packs/day: 0.25  . Smokeless tobacco: Never Used  Substance and Sexual Activity  . Alcohol use: No  . Drug use: No  . Sexual activity: Not on file  Other Topics Concern  . Not on file  Social History Narrative  . Not on file   Social Determinants of Health   Financial Resource Strain:   . Difficulty of Paying Living Expenses:   Food Insecurity: Food Insecurity Present  . Worried About Charity fundraiser in the Last Year: Sometimes true  . Ran Out of Food in the Last Year: Sometimes true  Transportation Needs:   . Lack of Transportation (Medical):   Marland Kitchen Lack of Transportation (Non-Medical):   Physical Activity:   . Days of Exercise per Week:   . Minutes of Exercise per Session:   Stress:   . Feeling of Stress :   Social Connections:   . Frequency of Communication with Friends and Family:   . Frequency of Social Gatherings with Friends and Family:   . Attends Religious Services:   . Active Member of Clubs or Organizations:   . Attends Archivist Meetings:   Marland Kitchen Marital Status:     Allergies  Allergen Reactions  .  Codeine Other (See Comments)    "Gets high"  . Tylenol [Acetaminophen] Hives     Current Outpatient Medications:  .  doxycycline (VIBRA-TABS) 100 MG tablet, Take 1 tablet (100 mg total) by mouth 2 (two) times daily., Disp: 60 tablet, Rfl: 1 .  DULoxetine (CYMBALTA) 60 MG capsule, Take 60 mg by mouth daily., Disp: , Rfl:  .  gabapentin (NEURONTIN) 300 MG capsule, Take 2 capsules (600 mg total) by mouth 3 (three) times daily., Disp: 180 capsule, Rfl: 0 .  metoprolol tartrate (LOPRESSOR) 25 MG tablet, Take 0.5 tablets (12.5 mg total) by mouth 2 (two) times daily., Disp: 60 tablet, Rfl: 3 .  metroNIDAZOLE (FLAGYL) 500 MG tablet, Take 1 tablet (500 mg total) by mouth 3 (three) times daily., Disp: 12 tablet, Rfl: 0 .   multivitamin (PROSIGHT) TABS tablet, Take 1 tablet by mouth daily., Disp: 30 tablet, Rfl: 3 .  naproxen sodium (ALEVE) 220 MG tablet, Take 220 mg by mouth daily as needed (pain)., Disp: , Rfl:  .  nutrition supplement, JUVEN, (JUVEN) PACK, Take 1 packet by mouth 2 (two) times daily between meals., Disp: 30 each, Rfl: 0 .  traMADol (ULTRAM) 50 MG tablet, Take by mouth every 12 (twelve) hours as needed for moderate pain., Disp: , Rfl:    Review of Systems  Constitutional: Negative for activity change, appetite change, chills, diaphoresis, fatigue, fever and unexpected weight change.  HENT: Negative for congestion, rhinorrhea, sinus pressure, sneezing, sore throat and trouble swallowing.   Eyes: Negative for photophobia and visual disturbance.  Respiratory: Negative for cough, chest tightness, shortness of breath, wheezing and stridor.   Cardiovascular: Negative for chest pain, palpitations and leg swelling.  Gastrointestinal: Negative for abdominal distention, abdominal pain, anal bleeding, blood in stool, constipation, diarrhea, nausea and vomiting.  Genitourinary: Negative for difficulty urinating, dysuria, flank pain and hematuria.  Musculoskeletal: Positive for myalgias. Negative for arthralgias, back pain, gait problem and joint swelling.  Skin: Positive for wound. Negative for color change, pallor and rash.  Neurological: Negative for dizziness, tremors, weakness and light-headedness.  Hematological: Negative for adenopathy. Does not bruise/bleed easily.  Psychiatric/Behavioral: Positive for dysphoric mood. Negative for agitation, behavioral problems, confusion, decreased concentration and sleep disturbance.       Objective:   Physical Exam Constitutional:      General: She is not in acute distress.    Appearance: Normal appearance. She is well-developed. She is not ill-appearing or diaphoretic.  HENT:     Head: Normocephalic and atraumatic.     Right Ear: Hearing and external ear  normal.     Left Ear: Hearing and external ear normal.     Nose: No nasal deformity or rhinorrhea.  Eyes:     General: No scleral icterus.    Conjunctiva/sclera: Conjunctivae normal.     Right eye: Right conjunctiva is not injected.     Left eye: Left conjunctiva is not injected.  Neck:     Vascular: No JVD.  Cardiovascular:     Rate and Rhythm: Normal rate and regular rhythm.     Heart sounds: Normal heart sounds, S1 normal and S2 normal. No murmur. No friction rub.  Abdominal:     General: Bowel sounds are normal. There is no distension.     Palpations: Abdomen is soft.     Tenderness: There is no abdominal tenderness.  Musculoskeletal:        General: Normal range of motion.     Right shoulder: Normal.  Left shoulder: Normal.     Cervical back: Normal range of motion and neck supple.     Right hip: Normal.     Left hip: Normal.     Right knee: Normal.     Left knee: Normal.  Lymphadenopathy:     Head:     Right side of head: No submandibular, preauricular or posterior auricular adenopathy.     Left side of head: No submandibular, preauricular or posterior auricular adenopathy.     Cervical: No cervical adenopathy.     Right cervical: No superficial or deep cervical adenopathy.    Left cervical: No superficial or deep cervical adenopathy.  Skin:    General: Skin is warm and dry.     Coloration: Skin is not pale.     Findings: No abrasion, bruising, ecchymosis, erythema, lesion or rash.     Nails: There is no clubbing.  Neurological:     General: No focal deficit present.     Mental Status: She is alert and oriented to person, place, and time.     Sensory: No sensory deficit.     Coordination: Coordination normal.     Gait: Gait normal.  Psychiatric:        Attention and Perception: She is attentive.        Speech: Speech normal.        Behavior: Behavior normal. Behavior is cooperative.        Thought Content: Thought content normal.        Judgment: Judgment  normal.    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  I think the patient is clearly correct and she does need an amputation.  I am referring her to [facility] for this  Left lower extremity wound: She says this is improving and she has been following with Dr. Leanord Hawking for this.  For now we will continue her on doxycycline.  I will have her come back in 6 weeks time to see [place] I have a low threshold to image that other leg as well.  Okay and she really needs to quit altogether.  She has smoking for more than 22 years.  Diabetes mellitus better control with A1c at 6.1 most recently.

## 2020-01-21 ENCOUNTER — Ambulatory Visit (INDEPENDENT_AMBULATORY_CARE_PROVIDER_SITE_OTHER): Payer: Medicare Other | Admitting: Orthopedic Surgery

## 2020-01-21 ENCOUNTER — Encounter: Payer: Self-pay | Admitting: Orthopedic Surgery

## 2020-01-21 DIAGNOSIS — M86461 Chronic osteomyelitis with draining sinus, right tibia and fibula: Secondary | ICD-10-CM

## 2020-01-21 DIAGNOSIS — L97929 Non-pressure chronic ulcer of unspecified part of left lower leg with unspecified severity: Secondary | ICD-10-CM

## 2020-01-21 DIAGNOSIS — I83019 Varicose veins of right lower extremity with ulcer of unspecified site: Secondary | ICD-10-CM | POA: Diagnosis not present

## 2020-01-21 DIAGNOSIS — I83029 Varicose veins of left lower extremity with ulcer of unspecified site: Secondary | ICD-10-CM | POA: Diagnosis not present

## 2020-01-21 DIAGNOSIS — L97919 Non-pressure chronic ulcer of unspecified part of right lower leg with unspecified severity: Secondary | ICD-10-CM

## 2020-01-21 NOTE — Progress Notes (Signed)
MANDA, HOLSTAD ([id number]) Visit Report for 01/19/2020 Chief Complaint Document Details Patient Name: Date of Service: [name], [name]. 01/19/2020 9:00 A M Medical Record Number: [id number] Patient Account Number: [id number] Date of Birth/Sex: Treating RN: 12-19-1950 (69 y.o. Freddy Finner Primary Care Provider: Wendee Copp, Whitman Hero Other Clinician: Referring Provider: Treating Provider/Extender: Cassandria Anger MECCA RIELLO, BA ILEY Weeks in Treatment: 0 Information Obtained from: Patient Chief Complaint Right Calf, Left Leg ulcers, non healing Electronic Signature(s) Signed: 01/19/2020 10:28:04 AM By: Cassandria Anger MD, MBA Entered By: Cassandria Anger on 01/19/2020 10:28:04 -------------------------------------------------------------------------------- HPI Details Patient Name: Date of Service: [name]. 01/19/2020 9:00 A M Medical Record Number: [id number] Patient Account Number: [id number] Date of Birth/Sex: Treating RN: 06-01-1951 (68 y.o. Freddy Finner Primary Care Provider: Wendee Copp, Whitman Hero Other Clinician: Referring Provider: Treating Provider/Extender: Cassandria Anger MECCA RIELLO, BA ILEY Weeks in Treatment: 0 History of Present Illness HPI Description: ADMISSION 03/03/18 This is a 69 year old woman who is here for review of wounds on her bilateral lower legs which she states of been there for 8 months. She is not able to give a really great history about how they started simply thinks that they showed up 1 day. She apparently has a history of weeping bilateral lower extremity edema probably chronic venous insufficiency plus or minus lymphedema. She was seen on 02/05/18 at her primary physician in [facility] family practice noted to have a large wound on the right leg a smaller wound on the left. She was given Unna boots and 10 days of Keflex. Previous to this she had been in the ER on several occasions in February for review of these wounds. The patient is not a  diabetic however she does smoke. Past medical history includes atrial fibrillation, anemia, bipolar disorder, We were not able to get an ABI in our clinic today as the patient could not stand it. She comes into the clinic without anything on the wounds. She has advanced home care at home although I'm really not certain what they've been putting on this Readmission: 08/12/2019 on evaluation today patient actually presents for follow-up here in our clinic we have not seen her since actually last year which was July 8 of 2019. With that being said she is still having issues with bilateral lower extremity wounds. The right is definitely worse than the left but both are significant. She has pressure ulcers to both heels along with the open lymphedema/venous ulcers to her legs. Unfortunately she is having significant pain she also appears to have severe cellulitis in my opinion especially on the right which is extending all the way up to her knee and seems to be tracking such from the wounds down below. There is no signs of obvious systemic infection such as sepsis but nonetheless she has had this it seems like for quite some time. There definitely is some signs of dementia or at least not fully understanding exactly what is going on though I think the patient is able to tell me the truth about her pain I think she is in tremendous pain. She initially told us that she was not going to allow anybody to touch her. However we discussed with her that we did need to have a look at the wounds and we would have to do a few things in order to try to help her she was cooperative and actually did very well. She just had to be somewhat coached and what was going on and  not just have something sprung on her. With that being said she is also very much able to lift up her legs to let me look at all the areas she did that on her own and was very helpful in that regard. 12/30-Patient is back after 2 weeks from the  facility where she is getting care for her wounds but she says she does her own wound cleaning, she has pressure ulcers and venous ulcers to her legs that unfortunately have been nonhealing and progressive, she was hospitalized for 2 days with cellulitis on the right which is the worst side and sent out on Augmentin which she is completing. The wound on the right leg look pretty bad there is no significant inflammation noted in the surrounding skin 09/09/2019 on evaluation today patient unfortunately continues to have significant pain in regard to her lower extremities. She has been tolerating dressing changes without complication. Fortunately there is no signs of active infection at this time. With that being said she has really somewhat poor blood flow in regard to her lower extremities. She has significant edema especially on the right but even on the left she is experiencing this as well. Unfortunately overall I am very concerned about the fact that this may not be something that actually can heal. 09/23/2019 on evaluation today patient unfortunately appears to be doing about the same. There is no signs of active systemic infection although she has significant wounds. I did question whether or not we should do a culture today of the wound area on the right lower extremity. The patient however does not want to do any cultures at this point. Last week we actually did recommend a light Coban over top of the con form to be performed at the facility as well that was actually 2 weeks ago when I saw her. Nonetheless they did not do that the doctor the facility change the orders because apparently she was crying out too much when they were trying to put on the Coban. Overall she does not seem to be doing too well in my opinion. Things again are about the same but at the same time she still is talking about the possibility of amputation. 11/05/2019; this is a patient was at [facility] on an ongoing basis. She  came in today with bedbugs however we did look at her. She has severe venous hypertension with lymphedema. I believe I am seeing her in the past but Allen Derry has been seeing her since December. She has circumferential venous wounds on the right leg, similar wounds on the left leg. She has bilateral areas on the Achilles part of her heels. She complains of a lot of pain and the only thing that she will tolerate apparently is Kerlix and Coban. We have been using silver alginate. She talks like somebody that she sees "my doctors" are talking about a right BKA but she is not sure who this is. ------------------------------------------------------- 01/19/20-Unfortunate 69 year old female with bipolar disorder, with chronic bilateral lower extremity issues including lymphedema, she was discharged from the clinic in March of this year, she always has a lot of pain and will not tolerate anything other than the 2 layer compression at the most. 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 She is at [facility] and is getting wound care at the facility. She continues to smoke. She is complaining of pain in both legs and will not allow any form of debridement whatsoever. She was offered some surgical option which I am guessing with at least debridement when she was hospitalized and she refused Patient's history includes chronic A. fib, lymphedema, tobacco abuse ongoing, bipolar disorder. From hospital discharge she was recommended ID follow-up Patient today is asking me if the right leg can be amputated because she would like to end the pain. She would not allow any Dopplers to be done or ABIs to be documented as a  Insurance claims handler) Signed: 01/19/2020 10:32:57 AM By: Cassandria Anger MD, MBA Entered By: Cassandria Anger on 01/19/2020 10:32:57 -------------------------------------------------------------------------------- Physical Exam Details Patient Name: Date of Service: [name]. 01/19/2020 9:00 A M Medical Record Number: [id number] Patient Account Number: [id number] Date of Birth/Sex: Treating RN: August 06, 1951 (69 y.o. Freddy Finner Primary Care Provider: Wendee Copp, Whitman Hero Other Clinician: Referring Provider: Treating Provider/Extender: Cassandria Anger MECCA RIELLO, BA ILEY Weeks in Treatment: 0 Constitutional alert and oriented x 3. sitting or standing blood pressure is within target range for patient.. supine blood pressure is within target range for patient.. pulse regular and within target range for patient.Marland Kitchen respirations regular, non-labored and within target range for patient.Marland Kitchen temperature within target range for patient.. . . Well- nourished and well-hydrated in no acute distress. Eyes conjunctiva clear no eyelid edema noted. pupils equal round and reactive to light and accommodation. Ears, Nose, Mouth, and Throat no gross abnormality of ear auricles or external auditory canals. normal hearing noted during conversation. mucus membranes moist. Neck supple with no LAD noted in anterior or posterior cervical chain. not enlarged. Respiratory normal breathing without difficulty. clear to auscultation bilaterally. Cardiovascular regular rate and rhythm with normal S1, S2. 2+ dorsalis pedis/posterior tibialis pulses. See below. Gastrointestinal (GI) soft, non-tender, non-distended, +BS. no hepatosplenomegaly. no ventral hernia noted. Musculoskeletal no significant deformity or arthritic changes, no loss or range of motion, no clubbing. full range of motion with greater than 10 degrees of flexion of the ankle. full range of motion with greater than 10 degrees of flexion of  the ankle. Integumentary (Hair, Skin) normal hair distribution and pattern. skin pink, warm, dry. Neurological cranial nerves 2-12 intact. Patient has normal sensation in the feet bilaterally to light touch. Psychiatric this patient is unable to make decisions and demonstrates poor insight into disease process. Alert and Oriented x 3. pleasant and cooperative. Notes Both legs are edematous, the right lower leg circumferential wound with exposed fat and muscle posteriorly This entire wound is covered with slough dense layer, patient does not allow any touch of the wound itself, surrounding skin looks intact with no cellulitis Left lower extremity ulcer circumscribed with dense area of slough covering the wound also no cellulitis surrounding this area. Electronic Signature(s) Signed: 01/19/2020 10:35:56 AM By: Cassandria Anger MD, MBA Previous Signature: 01/19/2020 10:34:19 AM Version By: Cassandria Anger MD, MBA Entered By: Cassandria Anger on 01/19/2020 10:35:55 -------------------------------------------------------------------------------- Physician Orders Details Patient Name: Date of Service: [name]. 01/19/2020 9:00 A M Medical Record Number: [id number] Patient Account Number: [id number] Date of Birth/Sex: Treating RN: Jan 13, 1951 (69 y.o. Freddy Finner Primary Care Provider: Wendee Copp, Whitman Hero Other Clinician: Referring Provider: Treating Provider/Extender: Cassandria Anger MECCA RIELLO, BA ILEY Weeks in Treatment: 0 Verbal / Phone Orders: No Diagnosis Coding Follow-up Appointments Return Appointment  in 2 weeks. Dressing Change Frequency Change dressing three times week. Wound Cleansing Wound #10 Right,Circumferential Lower Leg Clean wound with Normal Saline. - or wound cleanser Wound #7 Left,Medial Malleolus Clean wound with Normal Saline. - or wound cleanser Wound #8 Left,Posterior Lower Leg Clean wound with Normal Saline. - or wound cleanser Wound #9 Left,Lateral  Ankle Clean wound with Normal Saline. - or wound cleanser Primary Wound Dressing Wound #10 Right,Circumferential Lower Leg Calcium Alginate with Silver Wound #7 Left,Medial Malleolus Calcium Alginate with Silver Wound #8 Left,Posterior Lower Leg Calcium Alginate with Silver Wound #9 Left,Lateral Ankle Calcium Alginate with Silver Secondary Dressing Wound #10 Right,Circumferential Lower Leg Dry Gauze ABD pad Wound #7 Left,Medial Malleolus Dry Gauze ABD pad Wound #8 Left,Posterior Lower Leg Dry Gauze ABD pad Wound #9 Left,Lateral Ankle Dry Gauze ABD pad Edema Control Wound #10 Right,Circumferential Lower Leg Kerlix and Coban - Bilateral Wound #7 Left,Medial Malleolus Kerlix and Coban - Bilateral Wound #8 Left,Posterior Lower Leg Kerlix and Coban - Bilateral Wound #9 Left,Lateral Ankle Kerlix and Coban - Bilateral Consults General Surgery - assement for possible amputation of both legs or debridement Electronic Signature(s) Signed: 01/19/2020 5:17:39 PM By: Yevonne PaxEpps, Carrie RN Signed: 01/21/2020 4:57:23 PM By: Cassandria AngerMadduri, Nelline Lio MD, MBA Entered By: Yevonne PaxEpps, Carrie on 01/19/2020 10:23:35 Prescription 01/19/2020 -------------------------------------------------------------------------------- [name], Caroline M. Sincere Berlanga MD Patient Name: Provider: 03/20/1951 [id number] Date of Birth: NPI#Carmon Ginsberg: F [id number] Sex: DEA #: [id number] Phone #: License #: [facility] Patient Address: [address] [place], [place] [address] [address] [place] [address] [phone] Allergies Name Reaction Severity codeine Tylenol hives Provider's Orders General Surgery - assement for possible amputation of both legs or debridement Hand Signature: Date(s): Electronic Signature(s) Signed: 01/19/2020 5:17:39 PM By: Yevonne PaxEpps, Carrie RN Signed: 01/21/2020 4:57:23 PM By: Cassandria AngerMadduri, Amilia Vandenbrink MD, MBA Entered By: Yevonne PaxEpps, Carrie on 01/19/2020  10:23:36 -------------------------------------------------------------------------------- Problem List Details Patient Name: Date of Service: Caroline HemanWELLS, Caroline N M. 01/19/2020 9:00 A M Medical Record Number: [id number] Patient Account Number: [id number] Date of Birth/Sex: Treating RN: 04/07/1951 (69 y.o. Freddy FinnerF) Epps, Carrie Primary Care Provider: Wendee CoppMECCA RIELLO, [facility] I[facility] Other Clinician: Referring Provider: Treating Provider/Extender: Cassandria AngerMadduri, Tymesha Ditmore MECCA RIELLO, BA ILEY Weeks in Treatment: 0 Active Problems ICD-10 Encounter Code Description Active Date MDM Diagnosis L97.212 Non-pressure chronic ulcer of right calf with fat layer exposed 01/19/2020 No Yes L97.222 Non-pressure chronic ulcer of left calf with fat layer exposed 01/19/2020 No Yes I87.2 Venous insufficiency (chronic) (peripheral) 01/19/2020 No Yes Z72.0 Tobacco use 01/19/2020 No Yes M86.161 Other acute osteomyelitis, right tibia and fibula 01/19/2020 No Yes Inactive Problems Resolved Problems Electronic Signature(s) Signed: 01/19/2020 10:26:59 AM By: Cassandria AngerMadduri, Steffan Caniglia MD, MBA Previous Signature: 01/19/2020 10:26:08 AM Version By: Cassandria AngerMadduri, Augusten Lipkin MD, MBA Entered By: Cassandria AngerMadduri, Tanecia Mccay on 01/19/2020 10:26:59 -------------------------------------------------------------------------------- Progress Note Details Patient Name: Date of Service: Caroline HemanWELLS, Caroline N M. 01/19/2020 9:00 A M Medical Record Number: [id number] Patient Account Number: [id number] Date of Birth/Sex: Treating RN: 01/20/1951 (69 y.o. Freddy FinnerF) Epps, Carrie Primary Care Provider: Wendee CoppMECCA RIELLO, Whitman HeroBA ILEY Other Clinician: Referring Provider: Treating Provider/Extender: Cassandria AngerMadduri, Marrian Bells MECCA RIELLO, BA ILEY Weeks in Treatment: 0 Subjective Chief Complaint Information obtained from Patient Right Calf, Left Leg ulcers, non healing History of Present Illness (HPI) ADMISSION 03/03/18 This is a 69 year old woman who is here for review of wounds on her bilateral lower legs which she states of  been there for 8 months. She is not able to give a really great history about  how they started simply thinks that they showed up 1 day. She apparently has a history of weeping bilateral lower extremity edema probably chronic venous insufficiency plus or minus lymphedema. She was seen on 02/05/18 at her primary physician in [facility] family practice noted to have a large wound on the right leg a smaller wound on the left. She was given Unna boots and 10 days of Keflex. Previous to this she had been in the ER on several occasions in February for review of these wounds. The patient is not a diabetic however she does smoke. Past medical history includes atrial fibrillation, anemia, bipolar disorder, We were not able to get an ABI in our clinic today as the patient could not stand it. She comes into the clinic without anything on the wounds. She has advanced home care at home although I'm really not certain what they've been putting on this Readmission: 08/12/2019 on evaluation today patient actually presents for follow-up here in our clinic we have not seen her since actually last year which was July 8 of 2019. With that being said she is still having issues with bilateral lower extremity wounds. The right is definitely worse than the left but both are significant. She has pressure ulcers to both heels along with the open lymphedema/venous ulcers to her legs. Unfortunately she is having significant pain she also appears to have severe cellulitis in my opinion especially on the right which is extending all the way up to her knee and seems to be tracking such from the wounds down below. There is no signs of obvious systemic infection such as sepsis but nonetheless she has had this it seems like for quite some time. There definitely is some signs of dementia or at least not fully understanding exactly what is going on though I think the patient is able to tell me the truth about her pain I think she is in  tremendous pain. She initially told us that she was not going to allow anybody to touch her. However we discussed with her that we did need to have a look at the wounds and we would have to do a few things in order to try to help her she was cooperative and actually did very well. She just had to be somewhat coached and what was going on and not just have something sprung on her. With that being said she is also very much able to lift up her legs to let me look at all the areas she did that on her own and was very helpful in that regard. 12/30-Patient is back after 2 weeks from the facility where she is getting care for her wounds but she says she does her own wound cleaning, she has pressure ulcers and venous ulcers to her legs that unfortunately have been nonhealing and progressive, she was hospitalized for 2 days with cellulitis on the right which is the worst side and sent out on Augmentin which she is completing. The wound on the right leg look pretty bad there is no significant inflammation noted in the surrounding skin 09/09/2019 on evaluation today patient unfortunately continues to have significant pain in regard to her lower extremities. She has been tolerating dressing changes without complication. Fortunately there is no signs of active infection at this time. With that being said she has really somewhat poor blood flow in regard to her lower extremities. She has significant edema especially on the right but even on the left she is experiencing this as  well. Unfortunately overall I am very concerned about the fact that this may not be something that actually can heal. 09/23/2019 on evaluation today patient unfortunately appears to be doing about the same. There is no signs of active systemic infection although she has significant wounds. I did question whether or not we should do a culture today of the wound area on the right lower extremity. The patient however does not want to do any  cultures at this point. Last week we actually did recommend a light Coban over top of the con form to be performed at the facility as well that was actually 2 weeks ago when I saw her. Nonetheless they did not do that the doctor the facility change the orders because apparently she was crying out too much when they were trying to put on the Coban. Overall she does not seem to be doing too well in my opinion. Things again are about the same but at the same time she still is talking about the possibility of amputation. 11/05/2019; this is a patient was at [facility] on an ongoing basis. She came in today with bedbugs however we did look at her. She has severe venous hypertension with lymphedema. I believe I am seeing her in the past but Allen Derry has been seeing her since December. She has circumferential venous wounds on the right leg, similar wounds on the left leg. She has bilateral areas on the Achilles part of her heels. She complains of a lot of pain and the only thing that she will tolerate apparently is Kerlix and Coban. We have been using silver alginate. She talks like somebody that she sees "my doctors" are talking about a right BKA but she is not sure who this is. ------------------------------------------------------- 01/19/20-Unfortunate 69 year old female with bipolar disorder, with chronic bilateral lower extremity issues including lymphedema, she was discharged from the clinic in March of this year, she always has a lot of pain and will not tolerate anything other than the 2 layer compression at the most. 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 She is at [facility] and is getting wound care at  the facility. She continues to smoke. She is complaining of pain in both legs and will not allow any form of debridement whatsoever. She was offered some surgical option which I am guessing with at least debridement when she was hospitalized and she refused Patient's history includes chronic A. fib, lymphedema, tobacco abuse ongoing, bipolar disorder. From hospital discharge she was recommended ID follow-up Patient today is asking me if the right leg can be amputated because she would like to end the pain. She would not allow any Dopplers to be done or ABIs to be documented as a result Patient History Unable to Obtain Patient History due to Altered Mental Status. Information obtained from Patient. Allergies codeine, Tylenol (Reaction: hives) Family History Unknown History. Social History Current every day smoker - 1 ppd - started on 08/28/1995, Marital Status - Divorced, Alcohol Use - Never - denies etoh after stating use, Drug Use - Prior History, Caffeine Use - Daily - sodas all day. Medical History Eyes Denies history of Cataracts, Glaucoma Hematologic/Lymphatic Patient has history of Anemia Cardiovascular Patient has history of  Arrhythmia - atrial fib, Peripheral Venous Disease Denies history of Angina Endocrine Patient has history of Type II Diabetes Genitourinary Denies history of End Stage Renal Disease Musculoskeletal Patient has history of Osteoarthritis, Osteomyelitis - right fibula Oncologic Denies history of Received Chemotherapy, Received Radiation Psychiatric Denies history of Anorexia/bulimia, Confinement Anxiety Patient is treated with Controlled Diet. Blood sugar is tested. Hospitalization/Surgery History - c- section. - hysterectomy. 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 Eyes Complains or has symptoms of Glasses / Contacts. Denies complaints or symptoms of Dry Eyes, Vision Changes. Ear/Nose/Mouth/Throat Denies complaints or symptoms of Chronic sinus problems or rhinitis. Cardiovascular Denies complaints or symptoms of Chest pain. Gastrointestinal Denies complaints or symptoms of Frequent diarrhea, Nausea, Vomiting. Genitourinary Denies complaints or symptoms of Frequent urination. Integumentary (Skin) Complains or has symptoms of Wounds - bil lower legs. Musculoskeletal Complains or has symptoms of Muscle Weakness. Denies complaints or symptoms of Muscle Pain. Neurologic Denies complaints or symptoms of Numbness/parasthesias. Psychiatric Denies complaints or symptoms of Claustrophobia, Suicidal. Objective Constitutional alert and oriented x 3. sitting or standing blood pressure is within target range for patient.. supine blood pressure is within target range for patient.. pulse regular and within target range for patient.Marland Kitchen respirations regular, non-labored and within target range for patient.Marland Kitchen temperature within target range for patient.. Well- nourished and well-hydrated in no acute distress. Vitals Time Taken: 9:11 AM, Height: 66 in, Source: Stated, Weight: 160 lbs, BMI: 25.8, Temperature: 98.3 F, Pulse: 87 bpm, Respiratory Rate: 18 breaths/min, Blood Pressure: 119/64 mmHg. Eyes conjunctiva clear no eyelid edema noted. pupils equal round and reactive to light and accommodation. Ears, Nose, Mouth, and Throat no gross abnormality of ear auricles or external auditory canals. normal hearing noted during conversation. mucus membranes moist. Neck supple with no LAD noted in anterior or posterior cervical chain. not enlarged. Respiratory normal breathing without difficulty. clear to auscultation  bilaterally. Cardiovascular regular rate and rhythm with normal S1, S2. 2+ dorsalis pedis/posterior tibialis pulses. See below. Gastrointestinal (GI) soft, non-tender, non-distended, +BS. no hepatosplenomegaly. no ventral hernia noted. Musculoskeletal no significant deformity or arthritic changes, no loss or range of motion, no clubbing. full range of motion with greater than 10 degrees of flexion of the ankle. full range of motion with greater than 10 degrees of flexion of the ankle. Neurological cranial nerves 2-12 intact. Patient has normal sensation in the feet bilaterally to light touch. Psychiatric this patient is unable to make decisions and demonstrates poor insight into disease process. Alert and Oriented x 3. pleasant and cooperative. General Notes: Both legs are edematous, the right lower leg circumferential wound with exposed fat and muscle posteriorly This entire wound is covered with slough dense layer, patient does not allow any touch of the wound itself, surrounding skin looks intact with no cellulitis Left lower extremity ulcer circumscribed with dense area of slough covering the wound also no cellulitis surrounding this area. Integumentary (Hair, Skin) normal hair distribution and pattern. skin pink, warm, dry. Wound #10 status is Open. Original cause of wound was Gradually Appeared. The wound is located on the Right,Circumferential Lower Leg. The wound measures 18.5cm length x 25cm width x 0.2cm depth; 363.247cm^2 area and 72.649cm^3 volume. There is muscle and Fat Layer (Subcutaneous Tissue) Exposed exposed. There is no tunneling or undermining noted. There  is a large amount of serous drainage noted. The wound margin is flat and intact. There is small (1-33%) pink granulation within the wound bed. There is a large (67-100%) amount of necrotic tissue within the wound bed including Adherent Slough and Necrosis of Muscle. Wound #7 status is Open. Original cause of wound was  Gradually Appeared. The wound is located on the Left,Medial Malleolus. The wound measures 5.4cm length x 3cm width x 0.1cm depth; 12.723cm^2 area and 1.272cm^3 volume. There is Fat Layer (Subcutaneous Tissue) Exposed exposed. There is no tunneling or undermining noted. There is a large amount of purulent drainage noted. The wound margin is flat and intact. There is small (1-33%) pink granulation within the wound bed. There is a large (67-100%) amount of necrotic tissue within the wound bed including Adherent Slough. Wound #8 status is Open. Original cause of wound was Gradually Appeared. The wound is located on the Left,Posterior Lower Leg. The wound measures 6.8cm length x 7cm width x 0.1cm depth; 37.385cm^2 area and 3.738cm^3 volume. There is Fat Layer (Subcutaneous Tissue) Exposed exposed. There is no tunneling or undermining noted. There is a large amount of purulent drainage noted. The wound margin is flat and intact. There is small (1-33%) pink granulation within the wound bed. There is a large (67-100%) amount of necrotic tissue within the wound bed including Adherent Slough. Wound #9 status is Open. Original cause of wound was Gradually Appeared. The wound is located on the Left,Lateral Ankle. The wound measures 0.9cm length x 3.5cm width x 0.1cm depth; 2.474cm^2 area and 0.247cm^3 volume. There is Fat Layer (Subcutaneous Tissue) Exposed exposed. There is no tunneling or undermining noted. There is a small amount of serous drainage noted. The wound margin is flat and intact. There is medium (34-66%) pink granulation within the wound bed. There is a medium (34-66%) amount of necrotic tissue within the wound bed including Adherent Slough. 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 There was a Double Layer Compression Therapy Procedure by Yevonne Pax, RN. Post procedure Diagnosis Wound #10: Same as Pre-Procedure Wound #7 Pre-procedure diagnosis of Wound #7 is a Venous Leg Ulcer located on the Left,Medial Malleolus . There was a Double Layer Compression Therapy Procedure by Yevonne Pax, RN. Post procedure Diagnosis Wound #7: Same as Pre-Procedure Wound #8 Pre-procedure diagnosis of Wound #8 is a Venous Leg Ulcer located on the Left,Posterior Lower Leg . There was a Double Layer Compression Therapy Procedure by Yevonne Pax, RN. Post procedure Diagnosis Wound #8: Same as Pre-Procedure Wound #9 Pre-procedure diagnosis of Wound #9 is a Venous Leg Ulcer located on the Left,Lateral Ankle . There was a Double Layer Compression Therapy Procedure by Yevonne Pax, RN. Post procedure Diagnosis Wound #9: Same as Pre-Procedure Plan Follow-up Appointments: Return Appointment in 2 weeks. Dressing Change Frequency: Change dressing three times week. Wound Cleansing: Wound #10 Right,Circumferential Lower Leg: Clean wound with Normal Saline. - or wound cleanser Wound #7 Left,Medial Malleolus: Clean wound with Normal Saline. - or wound cleanser Wound #8 Left,Posterior Lower Leg: Clean wound with Normal Saline. - or wound cleanser Wound #9 Left,Lateral Ankle: Clean wound with Normal Saline. - or wound cleanser Primary Wound Dressing: Wound #10 Right,Circumferential Lower Leg: Calcium  Alginate with Silver Wound #7 Left,Medial Malleolus: Calcium Alginate with Silver Wound #8 Left,Posterior Lower Leg: Calcium Alginate with Silver Wound #9 Left,Lateral Ankle: Calcium Alginate with Silver Secondary Dressing: Wound #10 Right,Circumferential Lower Leg: Dry Gauze ABD pad Wound #7 Left,Medial Malleolus: Dry Gauze ABD pad Wound #8  Left,Posterior Lower Leg: Dry Gauze ABD pad Wound #9 Left,Lateral Ankle: Dry Gauze ABD pad Edema Control: Wound #10 Right,Circumferential Lower Leg: Kerlix and Coban - Bilateral Wound #7 Left,Medial Malleolus: Kerlix and Coban - Bilateral Wound #8 Left,Posterior Lower Leg: Kerlix and Coban - Bilateral Wound #9 Left,Lateral Ankle: Kerlix and Coban - Bilateral Consults ordered were: General Surgery - assement for possible amputation of both legs or debridement 1. Patient with bilateral lower extremity wounds nonhealing, with multiple adverse factors, unfortunately I think her lack of insight is the chief determinant and lack of progress 2. Surgical referral to see if OR debridement is a possibility but she keeps asking about BKA which she has in the past I doubt she will go through with any of these decisions due to her limited insight as well as fear of procedures 3. Continue with silver alginate dressings with light compression if possible 4. She is completing antibiotics in June and will be seen by ID as follow-up Electronic Signature(s) Signed: 01/19/2020 10:37:32 AM By: Cassandria Anger MD, MBA Entered By: Cassandria Anger on 01/19/2020 10:37:31 -------------------------------------------------------------------------------- HxROS Details Patient Name: Date of Service: [name]. 01/19/2020 9:00 A M Medical Record Number: [id number] Patient Account Number: [id number] Date of Birth/Sex: Treating RN: Oct 19, 1950 (69 y.o. Tommye Standard Primary Care Provider: Wendee Copp, Whitman Hero Other Clinician: Referring Provider: Treating Provider/Extender: Cassandria Anger MECCA RIELLO, BA ILEY Weeks in Treatment: 0 Unable to Obtain Patient History due to Altered Mental Status Information Obtained From Patient Constitutional Symptoms (General Health) Complaints and Symptoms: Negative for: Fatigue; Fever; Chills; Marked Weight Change Eyes Complaints and Symptoms: Positive for:  Glasses / Contacts Negative for: Dry Eyes; Vision Changes Medical History: Negative for: Cataracts; Glaucoma Ear/Nose/Mouth/Throat Complaints and Symptoms: Negative for: Chronic sinus problems or rhinitis Cardiovascular Complaints and Symptoms: Negative for: Chest pain Medical History: Positive for: Arrhythmia - atrial fib; Peripheral Venous Disease Negative for: Angina Past Medical History Notes: edema venous insufficiency cellulitis Gastrointestinal Complaints and Symptoms: Negative for: Frequent diarrhea; Nausea; Vomiting Genitourinary Complaints and Symptoms: Negative for: Frequent urination Medical History: Negative for: End Stage Renal Disease Integumentary (Skin) Complaints and Symptoms: Positive for: Wounds - bil lower legs Medical History: Past Medical History Notes: bilateral legs cellulitis Musculoskeletal Complaints and Symptoms: Positive for: Muscle Weakness Negative for: Muscle Pain Medical History: Positive for: Osteoarthritis; Osteomyelitis - right fibula Neurologic Complaints and Symptoms: Negative for: Numbness/parasthesias Psychiatric Complaints and Symptoms: Negative for: Claustrophobia; Suicidal Medical History: Negative for: Anorexia/bulimia; Confinement Anxiety Past Medical History Notes: bipolar 1 with mania borderline personality disorder Hematologic/Lymphatic Medical History: Positive for: Anemia Respiratory Endocrine Medical History: Positive for: Type II Diabetes Treated with: Diet Blood sugar tested every day: Yes Tested : daily Immunological Oncologic Medical History: Negative for: Received Chemotherapy; Received Radiation Immunizations Pneumococcal Vaccine: Received Pneumococcal Vaccination: Yes Immunization Notes: pt states 3 tetanus shots in last 6 months at [facility]. None noted in system or in notes from ED Implantable Devices No devices added Hospitalization / Surgery History Type of  Hospitalization/Surgery c- section hysterectomy Family and Social History Unknown History: Yes; Current every day smoker - 1 ppd - started on 08/28/1995; Marital Status - Divorced; Alcohol Use: Never - denies etoh after stating  use; Drug Use: Prior History; Caffeine Use: Daily - sodas all day; Financial Concerns: No; Food, Clothing or Shelter Needs: No; Support System Lacking: No; Transportation Concerns: No Engineer, maintenance) Signed: 01/19/2020 5:28:01 PM By: Baruch Gouty RN, BSN Signed: 01/21/2020 4:57:23 PM By: Tobi Bastos MD, MBA Entered By: Baruch Gouty on 01/19/2020 09:20:08 -------------------------------------------------------------------------------- SuperBill Details Patient Name: Date of Service: [name] 01/19/2020 Medical Record Number: [id number] Patient Account Number: [id number] Date of Birth/Sex: Treating RN: 08-10-1951 (69 y.o. Orvan Falconer Primary Care Provider: Fabiola Backer, Tina Griffiths Other Clinician: Referring Provider: Treating Provider/Extender: Tobi Bastos MECCA RIELLO, BA ILEY Weeks in Treatment: 0 Diagnosis Coding ICD-10 Codes Code Description [phone] Non-pressure chronic ulcer of right calf with fat layer exposed L97.222 Non-pressure chronic ulcer of left calf with fat layer exposed I87.2 Venous insufficiency (chronic) (peripheral) Z72.0 Tobacco use M86.161 Other acute osteomyelitis, right tibia and fibula Facility Procedures CPT4 Code: [id number] Description: 99213 - WOUND CARE VISIT-LEV 3 EST PT Modifier: Quantity: 1 Physician Procedures : CPT4 Code Description Modifier [id number] 22297 - WC PHYS LEVEL 4 - NEW PT ICD-10 Diagnosis Description L89.211 Non-pressure chronic ulcer of right calf with fat layer exposed Quantity: 1 Electronic Signature(s) Signed: 01/19/2020 5:17:39 PM By: Carlene Coria RN Signed: 01/21/2020 4:57:23 PM By: Tobi Bastos MD, MBA Previous Signature: 01/19/2020 10:37:47 AM Version By: Tobi Bastos MD,  MBA Entered By: Carlene Coria on 01/19/2020 12:12:02

## 2020-01-21 NOTE — Progress Notes (Signed)
Office Visit Note   Patient: [name]           Date of Birth: [birth year]           MRN: [id number] Visit Date: 01/21/2020              Requested by: Daiva Eves, Lisette Grinder, MD [address] [place],  [place] [address] PCP: [name], DO  No chief complaint on file.     HPI: Patient is a 69 year old woman with massive ulcerations on the right ankle and ulceration as well on the left ankle.  Patient has undergone prolonged conservative therapy with wound care and antibiotics for the chronic osteomyelitis of the fibula on the right as well as the chronic ulcers.  Patient is currently undergoing Silvadene dressing changes to both wounds.  Dr. Algis Liming and myself have both recommended below the knee amputation for the right leg.  Patient complains of excruciating pain in the right lower extremity.  Patient does not tolerate compression wraps.  Assessment & Plan: Visit Diagnoses:  1. 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 Venous stasis ulcers of both lower extremities (HCC)     Plan: Patient states that she absolutely does not want to consider amputation for the right lower extremity. Discussed that she could become septic from this chronic infection and that the only way to resolve the pain would also be to proceed with surgery.  Patient wants to continue with dressing changes.  Recommended exercise and elevation to help decrease the swelling.  Follow-Up Instructions: Return in about 4 weeks (around 02/18/2020).   Ortho Exam  Patient is alert, oriented, no adenopathy, well-dressed, normal affect, normal respiratory effort.  Patient has pain with light touch to the wounds. Examination patient has essentially a circumferential ulcer over the right ankle which is 15 cm in length and circumferential there is no healthy tissue in the wound bed there is drainage she has massive swelling in both lower extremities with pitting edema she is  exquisitely tender to palpation in the right lower extremity.  She does have palpable pulses with no arterial ischemic changes.  The ulcer on the left ankle medially is approximately 5 cm in diameter.  Imaging: No results found. No images are attached to the encounter.  Labs: Lab Results  Component Value Date   HGBA1C 6.1 (H) 12/22/2019   HGBA1C 6.6 (H) 05/04/2019   ESRSEDRATE 60 (H) 12/28/2019   ESRSEDRATE 102 (H) 12/22/2019   ESRSEDRATE 129 (H) 06/18/2019   CRP 1.1 (H) 12/28/2019   CRP 3.9 (H) 12/22/2019   CRP 21.8 (H) 06/18/2019   REPTSTATUS 12/27/2019 FINAL 12/22/2019   GRAMSTAIN  12/22/2019    FEW WBC PRESENT, PREDOMINANTLY PMN MODERATE GRAM NEGATIVE RODS    CULT  12/22/2019    NO GROWTH 5 DAYS Performed at [facility], [address], [place], [place] [address]    LABORGA PSEUDOMONAS AERUGINOSA 12/22/2019   LABORGA PROTEUS MIRABILIS 12/22/2019     Lab Results  Component Value Date   ALBUMIN 2.9 (L) 12/23/2019   ALBUMIN 3.0 (L) 12/21/2019   ALBUMIN 2.7 (L) 11/09/2019   PREALBUMIN 12.5 (L) 12/22/2019    Lab Results  Component Value Date   MG 1.9 06/19/2019   No results found for: [facility]  Lab Results  Component Value Date   PREALBUMIN 12.5 (L) 12/22/2019   CBC EXTENDED Latest Ref Rng & Units 12/29/2019 12/28/2019 12/26/2019  WBC 4.0 - 10.5  K/uL 3.8(L) 4.0 3.6(L)  RBC 3.87 - 5.11 MIL/uL 3.49(L) 3.46(L) 3.62(L)  HGB 12.0 - 15.0 g/dL 8.5(L) 8.6(L) 8.8(L)  HCT 36.0 - 46.0 % 29.8(L) 30.0(L) 30.9(L)  PLT 150 - 400 K/uL PLATELET CLUMPS NOTED ON SMEAR, UNABLE TO ESTIMATE 225 223  NEUTROABS 1.7 - 7.7 K/uL - - -  LYMPHSABS 0.7 - 4.0 K/uL - - -     There is no height or weight on file to calculate BMI.  Orders:  No orders of the defined types were placed in this encounter.  No orders of the defined types were placed in this encounter.    Procedures: No procedures performed  Clinical Data: No additional findings.  ROS:  All other systems negative,  except as noted in the HPI. Review of Systems  Objective: Vital Signs: There were no vitals taken for this visit.  Specialty Comments:  No specialty comments available.  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/21/2019   Pressure injury of skin 06/19/2019   Elevated C-reactive protein (CRP)    Leg ulcer (HCC)    Adjustment disorder with mixed disturbance of emotions and conduct    Type 2 diabetes mellitus with hyperglycemia (HCC) 05/04/2019   Infected ulcer of skin (HCC)    High risk social situation    Post-polio syndrome    Chronic pain syndrome    Limited mobility 04/20/2019   Venous stasis ulcer ([place]) 03/31/2019   Overgrown toenails 10/28/2018   Wound of right leg 02/06/2018   Atrial fibrillation ([place]) 01/03/2018   Anemia 01/03/2018   Elevated serum creatinine 01/03/2018   Bipolar I disorder with mania ([place]) 01/03/2018   Past Medical History:  Diagnosis Date   Arthritis    Hypoglycemia    Pneumonia, pneumococcal ([place])     Family History  Family history unknown: Yes    Past Surgical History:  Procedure Laterality Date   ABDOMINAL HYSTERECTOMY     CESAREAN SECTION     Social History   Occupational History   Not on file  Tobacco Use   Smoking status: Light Tobacco Smoker    Packs/day: 0.25   Smokeless tobacco: Never Used  Substance and Sexual Activity   Alcohol use: No   Drug use: No   Sexual activity: Not on file

## 2020-01-28 ENCOUNTER — Ambulatory Visit: Payer: Medicare Other | Admitting: Orthopedic Surgery

## 2020-02-02 ENCOUNTER — Encounter (HOSPITAL_BASED_OUTPATIENT_CLINIC_OR_DEPARTMENT_OTHER): Payer: Medicare Other | Attending: Internal Medicine | Admitting: Internal Medicine

## 2020-02-18 ENCOUNTER — Other Ambulatory Visit: Payer: Self-pay | Admitting: Physician Assistant

## 2020-02-18 ENCOUNTER — Ambulatory Visit (INDEPENDENT_AMBULATORY_CARE_PROVIDER_SITE_OTHER): Payer: Medicare Other | Admitting: Physician Assistant

## 2020-02-18 ENCOUNTER — Encounter: Payer: Self-pay | Admitting: Orthopedic Surgery

## 2020-02-18 DIAGNOSIS — M86061 Acute hematogenous osteomyelitis, right tibia and fibula: Secondary | ICD-10-CM

## 2020-02-18 NOTE — Progress Notes (Signed)
Nurse faxed Pre-op instructions to [facility]. Mazif, RN, confirmed receipt of fax and verbalized understanding of all pre-op instructions.

## 2020-02-18 NOTE — Pre-Procedure Instructions (Signed)
    [name]  02/18/2020                Procedure is scheduled on Friday, June 25  Report to [facility], Main Entrance or Entrance "A" at 10:05 AM   Call this number if you have problems the morning of surgery: [phone]  This is the number for the Pre- Surgical Desk.    Remember:  Do not eat after midnight.  You may drink clear liquids until 10:00 .  Clear liquids allowed are:  Water, Juice (non-citric and without pulp - diabetics please choose diet or no sugar options), Carbonated beverages - (diabetics please choose diet or no sugar options), Clear Tea, Black Coffee only (no creamer, milk or cream including half and half), Plain Jell-O only (diabetics please choose diet or no sugar options) and Gatorade (diabetics please choose diet or no sugar options)   >>>>>Please send patient's Medication Record with medications administrated documentation. ( this information is required prior to OR. This includes medications that may have been on hold for surgery)<<<<<   Take these medicines the morning of surgery with A SIP OF WATER:  DULoxetine (CYMBALTA)             gabapentin (NEURONTIN)             metoprolol tartrate (LOPRESSOR)   Take if needed: traMADol [name])  STOP taking Aspirin, Aspirin Products (Goody Powder, Excedrin Migraine), Ibuprofen (Advil), Naproxen (Aleve), Vitamins and Herbal Products (ie Fish Oil)                Patient should shower, wear clean clothes, brush teeth.   Do not wear jewelry, make-up or nail polish.  Do not wear lotions, powders, or perfumes, or deodorant.  Do not shave 48 hours prior to surgery.    Do not bring valuables to the hospital.  [facility] is not responsible for any belongings or valuables.

## 2020-02-18 NOTE — Progress Notes (Signed)
Office Visit Note   Patient: [name]           Date of Birth: 01-11-1951           MRN: [id number] Visit Date: 02/18/2020              Requested by: Cleophas Dunker, DO [address],  [place] [address] PCP: Cleophas Dunker, DO  Chief Complaint  Patient presents with  . Left Leg - Wound Check  . Right Leg - Wound Check     HPI The patient is a 69 year old woman with a history of bilateral lower extremity ulcers.  On the right side she had been previously advised that the best option would be a right below-knee amputation.  It was significant enough that she was also told that if she became ill she needed to go immediately to the emergency room.  She is in a nursing facility and they have been doing Silvadene dressing changes.  She has had significant increase in her right lower extremity pain and would like to go forward with a right below-knee amputation as was recommended  Assessment & Plan: Visit Diagnoses: No diagnosis found.  Plan: Reviewed the risks and procedure.  We will go forward with a right below-knee amputation tomorrow.  Continue with Silvadene dressing changes on the left  Follow-Up Instructions: No follow-ups on file.   Ortho Exam  Patient is alert, oriented, no adenopathy, well-dressed, normal affect, normal respiratory effort. Right lower extremity demonstrates a massive deep ulceration circumferentially.  She does have surrounding cellulitis and edema.  Tender to palpation purulent drainage left lower extremity demonstrates mild to moderate soft tissue swelling.  She has a 7 x 6 cm ulceration with fibrinous exudative tissue.  There is serous drainage but no foul odor.  Heart regular rate and rhythm lungs clear  Imaging: No results found. No images are attached to the encounter.  Labs: Lab Results  Component Value Date   HGBA1C 6.1 (H) 12/22/2019   HGBA1C 6.6 (H) 05/04/2019   ESRSEDRATE 60 (H) 12/28/2019   ESRSEDRATE 102 (H)  12/22/2019   ESRSEDRATE 129 (H) 06/18/2019   CRP 1.1 (H) 12/28/2019   CRP 3.9 (H) 12/22/2019   CRP 21.8 (H) 06/18/2019   REPTSTATUS 12/27/2019 FINAL 12/22/2019   GRAMSTAIN  12/22/2019    FEW WBC PRESENT, PREDOMINANTLY PMN MODERATE GRAM NEGATIVE RODS    CULT  12/22/2019    NO GROWTH 5 DAYS Performed at [facility], [place] [address], [place], [place] [address]    LABORGA PSEUDOMONAS AERUGINOSA 12/22/2019   LABORGA PROTEUS MIRABILIS 12/22/2019     Lab Results  Component Value Date   ALBUMIN 2.9 (L) 12/23/2019   ALBUMIN 3.0 (L) 12/21/2019   ALBUMIN 2.7 (L) 11/09/2019   PREALBUMIN 12.5 (L) 12/22/2019    Lab Results  Component Value Date   MG 1.9 06/19/2019   No results found for: [facility]  Lab Results  Component Value Date   PREALBUMIN 12.5 (L) 12/22/2019   CBC EXTENDED Latest Ref Rng & Units 12/29/2019 12/28/2019 12/26/2019  WBC 4.0 - 10.5 K/uL 3.8(L) 4.0 3.6(L)  RBC 3.87 - 5.11 MIL/uL 3.49(L) 3.46(L) 3.62(L)  HGB 12.0 - 15.0 g/dL 8.5(L) 8.6(L) 8.8(L)  HCT 36 - 46 % 29.8(L) 30.0(L) 30.9(L)  PLT 150 - 400 K/uL PLATELET CLUMPS NOTED ON SMEAR, UNABLE TO ESTIMATE 225 223  NEUTROABS 1.7 - 7.7 K/uL - - -  LYMPHSABS 0.7 - 4.0 K/uL - - -  There is no height or weight on file to calculate BMI.  Orders:  No orders of the defined types were placed in this encounter.  No orders of the defined types were placed in this encounter.    Procedures: No procedures performed  Clinical Data: No additional findings.  ROS:  All other systems negative, except as noted in the HPI. Review of Systems  Objective: Vital Signs: There were no vitals taken for this visit.  Specialty Comments:  No specialty comments available.  PMFS History: Patient Active Problem List   Diagnosis Date Noted  . Pseudomonas aeruginosa infection 12/24/2019  . Controlled type 2 diabetes mellitus with circulatory disorder, without long-term current use of insulin (HCC) 12/22/2019  . Osteomyelitis  of right fibula (HCC) 12/22/2019  . Severe protein-calorie malnutrition (HCC)   . Venous stasis ulcers of both lower extremities (HCC)   . Bilateral cellulitis of lower leg 08/12/2019  . 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 Pressure injury of skin 06/19/2019  . Elevated C-reactive protein (CRP)   . Leg ulcer (HCC)   . Adjustment disorder with mixed disturbance of emotions and conduct   . Type 2 diabetes mellitus with hyperglycemia (HCC) 05/04/2019  . Infected ulcer of skin (HCC)   . High risk social situation   . Post-polio syndrome   . Chronic pain syndrome   . Limited mobility 04/20/2019  . Venous stasis ulcer (HCC) 03/31/2019  . Overgrown toenails 10/28/2018  . Wound of right leg 02/06/2018  . Atrial fibrillation (HCC) 01/03/2018  . Anemia 01/03/2018  . Elevated serum creatinine 01/03/2018  . Bipolar I disorder with mania (HCC) 01/03/2018   Past Medical History:  Diagnosis Date  . Arthritis   . Hypoglycemia   . Pneumonia, pneumococcal (HCC)     Family History  Family history unknown: Yes    Past Surgical History:  Procedure Laterality Date  . ABDOMINAL HYSTERECTOMY    . CESAREAN SECTION     Social History   Occupational History  . Not on file  Tobacco Use  . Smoking status: Light Tobacco Smoker    Packs/day: 0.25  . Smokeless tobacco: Never Used  Vaping Use  . Vaping Use: Never used  Substance and Sexual Activity  . Alcohol use: No  . Drug use: No  . Sexual activity: Not on file

## 2020-02-18 NOTE — Progress Notes (Addendum)
I spoke with Raven, Ms. Grau nurse at [facility]. Raven reports that patient does not complain of chest pain or shortness of breath. Raven reported that patient tranfers to a chair with 1 person assist. Raven reports that patient is alert and oriented with occasional confusion.  I instructed Raven to hold vitamins and Aleve.I reviewed medications with Raven and faxed  Pre- Procedure Instruction. Raven reported that transportion is picking patient up at 0730, when Raven was speaking with Elnita Maxwell, Dr. Audrie Lia scheduler the time of surgery was not known, Elnita Maxwell said she will have tp be early for Covid test and 0730 pick up was set.  Ms Principato has had known Afib/Aflutter since 2017. Attempt was made to start patient on and anti coag, patient refused. I asked Rica Mast to review chart with me and an ECHO was not found, neither was a Cardiology consult. I informed Dr Krista Blue of above findings, he said , "ok".

## 2020-02-19 ENCOUNTER — Encounter (HOSPITAL_COMMUNITY): Payer: Self-pay | Admitting: Orthopedic Surgery

## 2020-02-19 ENCOUNTER — Inpatient Hospital Stay (HOSPITAL_COMMUNITY): Payer: Medicare Other | Admitting: Emergency Medicine

## 2020-02-19 ENCOUNTER — Other Ambulatory Visit: Payer: Self-pay

## 2020-02-19 ENCOUNTER — Inpatient Hospital Stay (HOSPITAL_COMMUNITY)
Admit: 2020-02-19 | Discharge: 2020-02-23 | DRG: 617 | Disposition: A | Discharge status: Skilled Nursing Facility | Payer: Medicare Other | Source: Skilled Nursing Facility | Attending: Orthopedic Surgery | Admitting: Orthopedic Surgery

## 2020-02-19 ENCOUNTER — Encounter (HOSPITAL_COMMUNITY)
Disposition: A | Discharge status: Skilled Nursing Facility | Payer: Self-pay | Source: Skilled Nursing Facility | Attending: Orthopedic Surgery

## 2020-02-19 DIAGNOSIS — M1990 Unspecified osteoarthritis, unspecified site: Secondary | ICD-10-CM | POA: Diagnosis not present

## 2020-02-19 DIAGNOSIS — F17210 Nicotine dependence, cigarettes, uncomplicated: Secondary | ICD-10-CM | POA: Diagnosis present

## 2020-02-19 DIAGNOSIS — Z8249 Family history of ischemic heart disease and other diseases of the circulatory system: Secondary | ICD-10-CM

## 2020-02-19 DIAGNOSIS — M86071 Acute hematogenous osteomyelitis, right ankle and foot: Secondary | ICD-10-CM | POA: Diagnosis present

## 2020-02-19 DIAGNOSIS — L97929 Non-pressure chronic ulcer of unspecified part of left lower leg with unspecified severity: Secondary | ICD-10-CM | POA: Diagnosis not present

## 2020-02-19 DIAGNOSIS — Z79899 Other long term (current) drug therapy: Secondary | ICD-10-CM

## 2020-02-19 DIAGNOSIS — Z885 Allergy status to narcotic agent status: Secondary | ICD-10-CM

## 2020-02-19 DIAGNOSIS — L97919 Non-pressure chronic ulcer of unspecified part of right lower leg with unspecified severity: Secondary | ICD-10-CM | POA: Diagnosis not present

## 2020-02-19 DIAGNOSIS — Z20822 Contact with and (suspected) exposure to covid-19: Secondary | ICD-10-CM | POA: Diagnosis present

## 2020-02-19 DIAGNOSIS — M869 Osteomyelitis, unspecified: Secondary | ICD-10-CM

## 2020-02-19 DIAGNOSIS — E1169 Type 2 diabetes mellitus with other specified complication: Principal | ICD-10-CM | POA: Diagnosis present

## 2020-02-19 DIAGNOSIS — I10 Essential (primary) hypertension: Secondary | ICD-10-CM | POA: Diagnosis not present

## 2020-02-19 DIAGNOSIS — E11622 Type 2 diabetes mellitus with other skin ulcer: Secondary | ICD-10-CM | POA: Diagnosis present

## 2020-02-19 DIAGNOSIS — Z888 Allergy status to other drugs, medicaments and biological substances status: Secondary | ICD-10-CM | POA: Diagnosis not present

## 2020-02-19 DIAGNOSIS — L97915 Non-pressure chronic ulcer of unspecified part of right lower leg with muscle involvement without evidence of necrosis: Secondary | ICD-10-CM | POA: Diagnosis not present

## 2020-02-19 HISTORY — PX: AMPUTATION: SHX166

## 2020-02-19 LAB — BASIC METABOLIC PANEL
Anion gap: 9 (ref 5–15)
BUN: 16 mg/dL (ref 8–23)
CO2: 26 mmol/L (ref 22–32)
Calcium: 8.9 mg/dL (ref 8.9–10.3)
Chloride: 105 mmol/L (ref 98–111)
Creatinine, Ser: 0.97 mg/dL (ref 0.44–1.00)
GFR calc Af Amer: >60 mL/min (ref >60)
GFR calc non Af Amer: 60 mL/min — ABNORMAL LOW (ref >60)
Glucose, Bld: 82 mg/dL (ref 70–99)
Potassium: 4.3 mmol/L (ref 3.5–5.1)
Sodium: 140 mmol/L (ref 135–145)

## 2020-02-19 LAB — CBC
HCT: 34.3 % — ABNORMAL LOW (ref 36.0–46.0)
Hemoglobin: 9.9 g/dL — ABNORMAL LOW (ref 12.0–15.0)
MCH: 25.2 pg — ABNORMAL LOW (ref 26.0–34.0)
MCHC: 28.9 g/dL — ABNORMAL LOW (ref 30.0–36.0)
MCV: 87.3 fL (ref 80.0–100.0)
Platelets: 295 10*3/uL (ref 150–400)
RBC: 3.93 MIL/uL (ref 3.87–5.11)
RDW: 17.8 % — ABNORMAL HIGH (ref 11.5–15.5)
WBC: 4.8 10*3/uL (ref 4.0–10.5)
nRBC: 0 % (ref 0.0–0.2)

## 2020-02-19 LAB — SURGICAL PCR SCREEN
MRSA, PCR: POSITIVE — AB
Staphylococcus aureus: POSITIVE — AB

## 2020-02-19 LAB — SARS CORONAVIRUS 2 BY RT PCR (HOSPITAL ORDER, PERFORMED IN ~~LOC~~ HOSPITAL LAB): SARS Coronavirus 2: NEGATIVE

## 2020-02-19 LAB — GLUCOSE, CAPILLARY: Glucose-Capillary: 114 mg/dL — ABNORMAL HIGH (ref 70–99)

## 2020-02-19 SURGERY — AMPUTATION BELOW KNEE
Anesthesia: General | Site: Knee | Laterality: Right

## 2020-02-19 MED ORDER — MIDAZOLAM HCL 2 MG/2ML IJ SOLN
INTRAMUSCULAR | Status: AC
  Administered 2020-02-19: 1 mg via INTRAVENOUS
  Filled 2020-02-19: qty 2

## 2020-02-19 MED ORDER — HYDROMORPHONE HCL 1 MG/ML IJ SOLN: 0.5000 mg | INTRAMUSCULAR | Status: DC | PRN

## 2020-02-19 MED ORDER — PHENYLEPHRINE 40 MCG/ML (10ML) SYRINGE FOR IV PUSH (FOR BLOOD PRESSURE SUPPORT)
PREFILLED_SYRINGE | INTRAVENOUS | Status: DC | PRN
  Administered 2020-02-19 (×5): 80 ug via INTRAVENOUS

## 2020-02-19 MED ORDER — SODIUM CHLORIDE 0.9 % IR SOLN
Status: DC | PRN
  Administered 2020-02-19: 1000 mL

## 2020-02-19 MED ORDER — METOPROLOL TARTRATE 25 MG PO TABS
12.5000 mg | ORAL_TABLET | Freq: Two times a day (BID) | ORAL | Status: DC
  Administered 2020-02-19 – 2020-02-23 (×4): 12.5 mg via ORAL
  Filled 2020-02-19 (×8): qty 1

## 2020-02-19 MED ORDER — ONDANSETRON HCL 4 MG/2ML IJ SOLN
INTRAMUSCULAR | Status: AC
  Filled 2020-02-19: qty 2

## 2020-02-19 MED ORDER — FENTANYL CITRATE (PF) 100 MCG/2ML IJ SOLN
25.0000 ug | INTRAMUSCULAR | Status: DC | PRN
  Administered 2020-02-19 (×2): 50 ug via INTRAVENOUS

## 2020-02-19 MED ORDER — ONDANSETRON HCL 4 MG PO TABS: 4.0000 mg | ORAL_TABLET | Freq: Four times a day (QID) | ORAL | Status: DC | PRN

## 2020-02-19 MED ORDER — EPHEDRINE SULFATE-NACL 50-0.9 MG/10ML-% IV SOSY: PREFILLED_SYRINGE | INTRAVENOUS | Status: DC | PRN

## 2020-02-19 MED ORDER — FENTANYL CITRATE (PF) 100 MCG/2ML IJ SOLN
INTRAMUSCULAR | Status: AC
  Administered 2020-02-19: 50 ug via INTRAVENOUS
  Filled 2020-02-19: qty 2

## 2020-02-19 MED ORDER — FENTANYL CITRATE (PF) 100 MCG/2ML IJ SOLN
INTRAMUSCULAR | Status: AC
  Filled 2020-02-19: qty 2

## 2020-02-19 MED ORDER — CEFAZOLIN SODIUM-DEXTROSE 2-4 GM/100ML-% IV SOLN
2.0000 g | INTRAVENOUS | Status: AC
  Administered 2020-02-19: 2 g via INTRAVENOUS
  Filled 2020-02-19: qty 100

## 2020-02-19 MED ORDER — ORAL CARE MOUTH RINSE: 15.0000 mL | Freq: Once | OROMUCOSAL | Status: AC

## 2020-02-19 MED ORDER — DOCUSATE SODIUM 100 MG PO CAPS
100.0000 mg | ORAL_CAPSULE | Freq: Two times a day (BID) | ORAL | Status: DC
  Administered 2020-02-19 – 2020-02-23 (×8): 100 mg via ORAL
  Filled 2020-02-19 (×8): qty 1

## 2020-02-19 MED ORDER — ONDANSETRON HCL 4 MG/2ML IJ SOLN
INTRAMUSCULAR | Status: DC | PRN
  Administered 2020-02-19: 4 mg via INTRAVENOUS

## 2020-02-19 MED ORDER — SODIUM CHLORIDE 0.9 % IV SOLN: INTRAVENOUS | Status: DC

## 2020-02-19 MED ORDER — METOCLOPRAMIDE HCL 5 MG/ML IJ SOLN: 5.0000 mg | Freq: Three times a day (TID) | INTRAMUSCULAR | Status: DC | PRN

## 2020-02-19 MED ORDER — FENTANYL CITRATE (PF) 250 MCG/5ML IJ SOLN
INTRAMUSCULAR | Status: AC
  Filled 2020-02-19: qty 5

## 2020-02-19 MED ORDER — CHLORHEXIDINE GLUCONATE 0.12 % MT SOLN: 15.0000 mL | Freq: Once | OROMUCOSAL | Status: AC

## 2020-02-19 MED ORDER — OXYCODONE HCL 5 MG PO TABS
5.0000 mg | ORAL_TABLET | ORAL | Status: DC | PRN
  Administered 2020-02-20 – 2020-02-21 (×4): 10 mg via ORAL
  Administered 2020-02-22: 5 mg via ORAL
  Filled 2020-02-19 (×2): qty 2
  Filled 2020-02-19: qty 1
  Filled 2020-02-19 (×3): qty 2

## 2020-02-19 MED ORDER — METHOCARBAMOL 500 MG PO TABS
500.0000 mg | ORAL_TABLET | Freq: Four times a day (QID) | ORAL | Status: DC | PRN
  Administered 2020-02-19 – 2020-02-22 (×4): 500 mg via ORAL
  Filled 2020-02-19 (×4): qty 1

## 2020-02-19 MED ORDER — MIDAZOLAM HCL 2 MG/2ML IJ SOLN: 1.0000 mg | Freq: Once | INTRAMUSCULAR | Status: AC

## 2020-02-19 MED ORDER — CEFAZOLIN SODIUM-DEXTROSE 2-4 GM/100ML-% IV SOLN
2.0000 g | Freq: Four times a day (QID) | INTRAVENOUS | Status: AC
  Administered 2020-02-19 – 2020-02-20 (×3): 2 g via INTRAVENOUS
  Filled 2020-02-19 (×3): qty 100

## 2020-02-19 MED ORDER — FENTANYL CITRATE (PF) 100 MCG/2ML IJ SOLN
INTRAMUSCULAR | Status: DC | PRN
  Administered 2020-02-19 (×4): 25 ug via INTRAVENOUS

## 2020-02-19 MED ORDER — ONDANSETRON HCL 4 MG/2ML IJ SOLN: 4.0000 mg | Freq: Four times a day (QID) | INTRAMUSCULAR | Status: DC | PRN

## 2020-02-19 MED ORDER — METOCLOPRAMIDE HCL 5 MG PO TABS: 5.0000 mg | ORAL_TABLET | Freq: Three times a day (TID) | ORAL | Status: DC | PRN

## 2020-02-19 MED ORDER — EPHEDRINE SULFATE-NACL 50-0.9 MG/10ML-% IV SOSY
PREFILLED_SYRINGE | INTRAVENOUS | Status: DC | PRN
  Administered 2020-02-19 (×2): 10 mg via INTRAVENOUS

## 2020-02-19 MED ORDER — GABAPENTIN 300 MG PO CAPS
600.0000 mg | ORAL_CAPSULE | Freq: Three times a day (TID) | ORAL | Status: DC
  Administered 2020-02-19 – 2020-02-23 (×13): 600 mg via ORAL
  Filled 2020-02-19 (×13): qty 2

## 2020-02-19 MED ORDER — METHOCARBAMOL 1000 MG/10ML IJ SOLN
500.0000 mg | Freq: Four times a day (QID) | INTRAVENOUS | Status: DC | PRN
  Filled 2020-02-19: qty 5

## 2020-02-19 MED ORDER — DULOXETINE HCL 60 MG PO CPEP
60.0000 mg | ORAL_CAPSULE | Freq: Every day | ORAL | Status: DC
  Administered 2020-02-19 – 2020-02-23 (×5): 60 mg via ORAL
  Filled 2020-02-19 (×5): qty 1

## 2020-02-19 MED ORDER — BUPIVACAINE LIPOSOME 1.3 % IJ SUSP
INTRAMUSCULAR | Status: DC | PRN
  Administered 2020-02-19 (×2): 5 mL via PERINEURAL

## 2020-02-19 MED ORDER — BUPIVACAINE-EPINEPHRINE (PF) 0.5% -1:200000 IJ SOLN
INTRAMUSCULAR | Status: DC | PRN
  Administered 2020-02-19: 10 mL via PERINEURAL
  Administered 2020-02-19: 20 mL via PERINEURAL

## 2020-02-19 MED ORDER — PROMETHAZINE HCL 25 MG/ML IJ SOLN: 6.2500 mg | INTRAMUSCULAR | Status: DC | PRN

## 2020-02-19 MED ORDER — PROPOFOL 10 MG/ML IV BOLUS
INTRAVENOUS | Status: DC | PRN
  Administered 2020-02-19: 180 mg via INTRAVENOUS

## 2020-02-19 MED ORDER — CHLORHEXIDINE GLUCONATE 0.12 % MT SOLN
OROMUCOSAL | Status: AC
  Administered 2020-02-19: 15 mL via OROMUCOSAL
  Filled 2020-02-19: qty 15

## 2020-02-19 MED ORDER — METRONIDAZOLE 500 MG PO TABS: 500.0000 mg | ORAL_TABLET | Freq: Three times a day (TID) | ORAL | Status: DC

## 2020-02-19 MED ORDER — LACTATED RINGERS IV SOLN: INTRAVENOUS | Status: DC

## 2020-02-19 MED ORDER — LIDOCAINE 2% (20 MG/ML) 5 ML SYRINGE
INTRAMUSCULAR | Status: DC | PRN
  Administered 2020-02-19: 50 mg via INTRAVENOUS

## 2020-02-19 MED ORDER — PROSIGHT PO TABS
1.0000 | ORAL_TABLET | Freq: Every day | ORAL | Status: DC
  Administered 2020-02-19 – 2020-02-23 (×5): 1 via ORAL
  Filled 2020-02-19 (×5): qty 1

## 2020-02-19 MED ORDER — JUVEN PO PACK
1.0000 | PACK | Freq: Two times a day (BID) | ORAL | Status: DC
  Administered 2020-02-20 – 2020-02-23 (×6): 1 via ORAL
  Filled 2020-02-19 (×6): qty 1

## 2020-02-19 MED ORDER — FENTANYL CITRATE (PF) 100 MCG/2ML IJ SOLN: 50.0000 ug | Freq: Once | INTRAMUSCULAR | Status: AC

## 2020-02-19 SURGICAL SUPPLY — 38 items
BLADE SAW RECIP 87.9 MT (BLADE) ×3 IMPLANT
BLADE SURG 21 STRL SS (BLADE) ×3 IMPLANT
BNDG COHESIVE 6X5 TAN STRL LF (GAUZE/BANDAGES/DRESSINGS) IMPLANT
CANISTER WOUND CARE 500ML ATS (WOUND CARE) ×3 IMPLANT
COVER SURGICAL LIGHT HANDLE (MISCELLANEOUS) ×3 IMPLANT
COVER WAND RF STERILE (DRAPES) IMPLANT
CUFF TOURN SGL QUICK 34 (TOURNIQUET CUFF) ×3
CUFF TRNQT CYL 34X4.125X (TOURNIQUET CUFF) ×1 IMPLANT
DRAPE INCISE IOBAN 66X45 STRL (DRAPES) ×3 IMPLANT
DRAPE U-SHAPE 47X51 STRL (DRAPES) ×3 IMPLANT
DRESSING PREVENA PLUS CUSTOM (GAUZE/BANDAGES/DRESSINGS) ×1 IMPLANT
DRSG PREVENA PLUS CUSTOM (GAUZE/BANDAGES/DRESSINGS) ×3
DURAPREP 26ML APPLICATOR (WOUND CARE) ×3 IMPLANT
ELECT REM PT RETURN 9FT ADLT (ELECTROSURGICAL) ×3
ELECTRODE REM PT RTRN 9FT ADLT (ELECTROSURGICAL) ×1 IMPLANT
GLOVE BIOGEL PI IND STRL 9 (GLOVE) ×1 IMPLANT
GLOVE BIOGEL PI INDICATOR 9 (GLOVE) ×2
GLOVE SURG ORTHO 9.0 STRL STRW (GLOVE) ×3 IMPLANT
GOWN STRL REUS W/ TWL XL LVL3 (GOWN DISPOSABLE) ×2 IMPLANT
GOWN STRL REUS W/TWL XL LVL3 (GOWN DISPOSABLE) ×6
KIT BASIN OR (CUSTOM PROCEDURE TRAY) ×3 IMPLANT
KIT TURNOVER KIT B (KITS) ×3 IMPLANT
MANIFOLD NEPTUNE II (INSTRUMENTS) ×3 IMPLANT
NS IRRIG 1000ML POUR BTL (IV SOLUTION) ×3 IMPLANT
PACK ORTHO EXTREMITY (CUSTOM PROCEDURE TRAY) ×3 IMPLANT
PAD ARMBOARD 7.5X6 YLW CONV (MISCELLANEOUS) ×3 IMPLANT
PREVENA RESTOR ARTHOFORM 46X30 (CANNISTER) ×3 IMPLANT
SPONGE LAP 18X18 RF (DISPOSABLE) IMPLANT
STAPLER VISISTAT 35W (STAPLE) IMPLANT
STOCKINETTE IMPERVIOUS LG (DRAPES) ×3 IMPLANT
SUT ETHILON 2 0 PSLX (SUTURE) IMPLANT
SUT SILK 2 0 (SUTURE) ×3
SUT SILK 2-0 18XBRD TIE 12 (SUTURE) ×1 IMPLANT
SUT VIC AB 1 CTX 27 (SUTURE) ×6 IMPLANT
TOWEL GREEN STERILE (TOWEL DISPOSABLE) ×3 IMPLANT
TUBE CONNECTING 12'X1/4 (SUCTIONS) ×1
TUBE CONNECTING 12X1/4 (SUCTIONS) ×2 IMPLANT
YANKAUER SUCT BULB TIP NO VENT (SUCTIONS) ×3 IMPLANT

## 2020-02-19 NOTE — Anesthesia Procedure Notes (Signed)
Procedure Name: LMA Insertion Date/Time: 02/19/2020 1:43 PM Performed by: Marny Lowenstein, CRNA Pre-anesthesia Checklist: Patient identified, Emergency Drugs available, Suction available and Patient being monitored Patient Re-evaluated:Patient Re-evaluated prior to induction Oxygen Delivery Method: Circle system utilized Preoxygenation: Pre-oxygenation with 100% oxygen Induction Type: IV induction Ventilation: Mask ventilation without difficulty LMA: LMA inserted LMA Size: 4.0 Number of attempts: 1 Placement Confirmation: positive ETCO2 and breath sounds checked- equal and bilateral Tube secured with: Tape Dental Injury: Teeth and Oropharynx as per pre-operative assessment

## 2020-02-19 NOTE — Anesthesia Postprocedure Evaluation (Signed)
Anesthesia Post Note  Patient: [name]  Procedure(s) Performed: RIGHT BELOW KNEE AMPUTATION (Right Knee)     Patient location during evaluation: PACU Anesthesia Type: General Level of consciousness: awake and alert and oriented Pain management: pain level controlled Vital Signs Assessment: post-procedure vital signs reviewed and stable Respiratory status: spontaneous breathing, nonlabored ventilation and respiratory function stable Cardiovascular status: blood pressure returned to baseline Postop Assessment: no apparent nausea or vomiting Anesthetic complications: no   No complications documented.  Last Vitals:  Vitals:   02/19/20 1445 02/19/20 1500  BP: (!) 126/52 (!) 125/56  Pulse: 75 78  Resp: 16 12  Temp:    SpO2: 95% 100%    Last Pain:  Vitals:   02/19/20 1500  TempSrc:   PainSc: Asleep        RLE Motor Response: Purposeful movement;Responds to commands (02/19/20 1500) RLE Sensation: Decreased (due to nerve block) (02/19/20 1500)      Kaylyn Layer

## 2020-02-19 NOTE — Transfer of Care (Signed)
Immediate Anesthesia Transfer of Care Note  Patient: [name]  Procedure(s) Performed: RIGHT BELOW KNEE AMPUTATION (Right Knee)  Patient Location: PACU  Anesthesia Type:General and Regional  Level of Consciousness: drowsy  Airway & Oxygen Therapy: Patient Spontanous Breathing and Patient connected to face mask oxygen  Post-op Assessment: Report given to RN and Post -op Vital signs reviewed and stable  Post vital signs: Reviewed and stable  Last Vitals:  Vitals Value Taken Time  BP 116/49 02/19/20 1430  Temp    Pulse 77 02/19/20 1432  Resp 13 02/19/20 1432  SpO2 96 % 02/19/20 1432  Vitals shown include unvalidated device data.  Last Pain:  Vitals:   02/19/20 1156  TempSrc:   PainSc: 4       Patients Stated Pain Goal: 2 (02/19/20 1156)  Complications: No complications documented.

## 2020-02-19 NOTE — Anesthesia Preprocedure Evaluation (Addendum)
Anesthesia Evaluation  Patient identified by MRN, date of birth, ID band Patient awake    Reviewed: Allergy & Precautions, NPO status , Patient's Chart, lab work & pertinent test results, reviewed documented beta blocker date and time   History of Anesthesia Complications Negative for: history of anesthetic complications  Airway Mallampati: II  TM Distance: >3 FB Neck ROM: Full    Dental  (+) Edentulous Upper, Edentulous Lower   Pulmonary Current Smoker,    Pulmonary exam normal        Cardiovascular hypertension, Pt. on home beta blockers and Pt. on medications Normal cardiovascular exam     Neuro/Psych Depression Bipolar Disorder negative neurological ROS     GI/Hepatic negative GI ROS, Neg liver ROS,   Endo/Other  diabetes, Type 2  Renal/GU negative Renal ROS  negative genitourinary   Musculoskeletal  (+) Arthritis , Right Leg osteomyelitis   Abdominal   Peds  Hematology negative hematology ROS (+)   Anesthesia Other Findings Day of surgery medications reviewed with patient.  Reproductive/Obstetrics negative OB ROS                            Anesthesia Physical Anesthesia Plan  ASA: III  Anesthesia Plan: General   Post-op Pain Management: GA combined w/ Regional for post-op pain   Induction: Intravenous  PONV Risk Score and Plan: 3 and Treatment may vary due to age or medical condition, Ondansetron and Dexamethasone  Airway Management Planned: LMA  Additional Equipment: None  Intra-op Plan:   Post-operative Plan: Extubation in OR  Informed Consent: I have reviewed the patients History and Physical, chart, labs and discussed the procedure including the risks, benefits and alternatives for the proposed anesthesia with the patient or authorized representative who has indicated his/her understanding and acceptance.     Dental advisory given  Plan Discussed with:  CRNA  Anesthesia Plan Comments:        Anesthesia Quick Evaluation

## 2020-02-19 NOTE — Op Note (Signed)
° °  Date of Surgery: 02/19/2020  INDICATIONS: Ms. Dipierro is a 69 y.o.-year-old female who has circumferential necrotic ulceration to the right leg with nonviable muscle.  Patient has failed conservative wound therapy and presents at this time for transtibial amputation.Marland Kitchen  PREOPERATIVE DIAGNOSIS: Circumferential necrotic ulceration involving the muscle of the right lower extremity  POSTOPERATIVE DIAGNOSIS: Same.  PROCEDURE: Transtibial amputation Application of Prevena wound VAC  SURGEON: Lajoyce Corners, M.D.  ANESTHESIA:  general  IV FLUIDS AND URINE: See anesthesia.  ESTIMATED BLOOD LOSS: Minimal mL.  COMPLICATIONS: None.  DESCRIPTION OF PROCEDURE: The patient was brought to the operating room and underwent a general anesthetic. After adequate levels of anesthesia were obtained patient's lower extremity was prepped using DuraPrep draped into a sterile field. A timeout was called. The foot was draped out of the sterile field with impervious stockinette. A transverse incision was made 11 cm distal to the tibial tubercle. This curved proximally and a large posterior flap was created. The tibia was transected 1 cm proximal to the skin incision. The fibula was transected just proximal to the tibial incision. The tibia was beveled anteriorly. A large posterior flap was created. The sciatic nerve was pulled cut and allowed to retract. The vascular bundles were suture ligated with 2-0 silk. The deep and superficial fascial layers were closed using #1 Vicryl. The skin was closed using staples and 2-0 nylon. The wound was covered with a Prevena wound VAC. There was a good suction fit. A prosthetic shrinker was applied. Patient was extubated taken to the PACU in stable condition.   DISCHARGE PLANNING:  Antibiotic duration: 24 hours  Weightbearing: Transfer training  Pain medication: Opioid pathway  Dressing care/ Wound VAC: Continue wound VAC for 1 week after discharge  Discharge to: Anticipate  discharge back to skilled nursing.  Follow-up: In the office 1 week post operative.  Aldean Baker, MD [facility] 2:37 PM

## 2020-02-19 NOTE — Anesthesia Procedure Notes (Addendum)
Anesthesia Regional Block: Adductor canal block   Pre-Anesthetic Checklist: ,, timeout performed, Correct Patient, Correct Site, Correct Laterality, Correct Procedure, Correct Position, site marked, Risks and benefits discussed, pre-op evaluation,  At surgeon's request and post-op pain management  Laterality: Right  Prep: Maximum Sterile Barrier Precautions used, chloraprep       Needles:  Injection technique: Single-shot  Needle Type: Echogenic Stimulator Needle     Needle Length: 9cm  Needle Gauge: 22     Additional Needles:   Procedures:,,,, ultrasound used (permanent image in chart),,,,  Narrative:  Start time: 02/19/2020 1:13 PM End time: 02/19/2020 1:14 PM Injection made incrementally with aspirations every 5 mL.  Performed by: Personally  Anesthesiologist: Kaylyn Layer, MD  Additional Notes: Risks, benefits, and alternative discussed. Patient gave consent for procedure. Patient prepped and draped in sterile fashion. Sedation administered, patient remains easily responsive to voice. Relevant anatomy identified with ultrasound guidance. Local anesthetic given in 5cc increments with no signs or symptoms of intravascular injection. No pain or paraesthesias with injection. Patient monitored throughout procedure with signs of LAST or immediate complications. Tolerated well. Ultrasound image placed in chart.  Amalia Greenhouse, MD

## 2020-02-19 NOTE — Plan of Care (Signed)
  Problem: Health Behavior/Discharge Planning: Goal: Ability to manage health-related needs will improve Outcome: Progressing   Problem: Clinical Measurements: Goal: Ability to maintain clinical measurements within normal limits will improve Outcome: Progressing Goal: Will remain free from infection Outcome: Progressing   Problem: Activity: Goal: Risk for activity intolerance will decrease Outcome: Progressing   

## 2020-02-19 NOTE — Anesthesia Procedure Notes (Addendum)
Anesthesia Regional Block: Popliteal block   Pre-Anesthetic Checklist: ,, timeout performed, Correct Patient, Correct Site, Correct Laterality, Correct Procedure, Correct Position, site marked, Risks and benefits discussed, pre-op evaluation,  At surgeon's request and post-op pain management  Laterality: Right  Prep: Maximum Sterile Barrier Precautions used, chloraprep       Needles:  Injection technique: Single-shot  Needle Type: Echogenic Stimulator Needle     Needle Length: 9cm  Needle Gauge: 22     Additional Needles:   Procedures:,,,, ultrasound used (permanent image in chart),,,,  Narrative:  Start time: 02/19/2020 1:15 PM End time: 02/19/2020 1:17 PM Injection made incrementally with aspirations every 5 mL.  Performed by: Personally  Anesthesiologist: Kaylyn Layer, MD  Additional Notes: Risks, benefits, and alternative discussed. Patient gave consent for procedure. Patient prepped and draped in sterile fashion. Sedation administered, patient remains easily responsive to voice. Relevant anatomy identified with ultrasound guidance. Local anesthetic given in 5cc increments with no signs or symptoms of intravascular injection. No pain or paraesthesias with injection. Patient monitored throughout procedure with signs of LAST or immediate complications. Tolerated well. Ultrasound image placed in chart.  Caroline Greenhouse, MD

## 2020-02-19 NOTE — H&P (Signed)
[name] is an 69 y.o. female.   Chief Complaint: Right Leg osteomyelitis HPI: The patient is a 69 year old woman with a history of bilateral lower extremity ulcers.  On the right side she had been previously advised that the best option would be a right below-knee amputation.  It was significant enough that she was also told that if she became ill she needed to go immediately to the emergency room.  She is in a nursing facility and they have been doing Silvadene dressing changes.  She has had significant increase in her right lower extremity pain and would like to go forward with a right below-knee amputation as was recommended  Past Medical History:  Diagnosis Date   Arthritis    Hypoglycemia    Pneumonia, pneumococcal (HCC)     Past Surgical History:  Procedure Laterality Date   ABDOMINAL HYSTERECTOMY     CESAREAN SECTION      Family History  Family history unknown: Yes   Social History:  reports that she has been smoking. She has been smoking about 0.25 packs per day. She has never used smokeless tobacco. She reports that she does not drink alcohol and does not use drugs.  Allergies:  Allergies  Allergen Reactions   Codeine Other (See Comments)    "Gets high"   Tylenol [Acetaminophen] Hives    No medications prior to admission.    No results found for this or any previous visit (from the past 48 hour(s)). No results found.  Review of Systems  All other systems reviewed and are negative.   There were no vitals taken for this visit. Physical Exam  Patient is alert, oriented, no adenopathy, well-dressed, normal affect, normal respiratory effort. Right lower extremity demonstrates a massive deep ulceration circumferentially.  She does have surrounding cellulitis and edema.  Tender to palpation purulent drainage left lower extremity demonstrates mild to moderate soft tissue swelling.  She has a 7 x 6 cm ulceration with fibrinous exudative tissue.  There is serous  drainage but no foul odor. Lungs clear heart RRR Assessment/Plan  Plan: Reviewed the risks and procedure.  We will go forward with a right below-knee amputation tomorrow.  Continue with Silvadene dressing changes on the left  [place] Bertha Lokken, PA 02/19/2020, 6:42 AM

## 2020-02-20 ENCOUNTER — Encounter (HOSPITAL_COMMUNITY): Payer: Self-pay | Admitting: Orthopedic Surgery

## 2020-02-20 DIAGNOSIS — E1169 Type 2 diabetes mellitus with other specified complication: Secondary | ICD-10-CM | POA: Diagnosis not present

## 2020-02-20 DIAGNOSIS — M86071 Acute hematogenous osteomyelitis, right ankle and foot: Secondary | ICD-10-CM | POA: Diagnosis not present

## 2020-02-20 NOTE — Evaluation (Signed)
Occupational Therapy Evaluation Patient Details Name: [name] MRN: [id number] DOB: Mar 01, 1951 Today's Date: 02/20/2020    History of Present Illness Patient is a 69 year old female S/P right BKA. She has wonds on the left leg as well. Seh comes from [facility]. PMH: hypoglycemia, confused today  ( not sure if this is baseline. Per previous note borderline personality.    Clinical Impression   Pt admitted with above diagnoses, from [facility]. She is currently not able to give accurate PLOF due to mental status. Pt reports she is from home, living alone- when chart review indicates pt was brought from [facility]. At time of eval, pt completing bed mobility with mod A +2 and min guard- min A to maintain sitting balance EOB. Pt able to tolerate sitting ~5 minutes before reporting chest pain, which she was then assisted back to supine when she reported no chest pain at all. Pt presenting with cognitive deficits in orientation (could not state birth date), awareness, safety, and command following. Noted poor insight into deficits with pt stating "this wasn't a big surgery, I am fine". Given current status, recommend pt return to SNF. Will continue to follow per POC listed below.     Follow Up Recommendations  SNF;Other (comment) (return to SNF)    Equipment Recommendations  None recommended by OT    Recommendations for Other Services       Precautions / Restrictions Precautions Precautions: Fall Restrictions Weight Bearing Restrictions: No RLE Weight Bearing: Non weight bearing      Mobility Bed Mobility Overal bed mobility: Needs Assistance Bed Mobility: Supine to Sit     Supine to sit: Mod assist;+2 for safety/equipment     General bed mobility comments: mod A to come to EOB sitting; cues for safety  Transfers                 General transfer comment: Therapy got the patient positioned to stand but patient reported chest pain. She was moved back to a  supine psoition anfd then reported no chest pain at all. Nursing was notified.     Balance Overall balance assessment: Needs assistance Sitting-balance support: Bilateral upper extremity supported Sitting balance-Leahy Scale: Poor Sitting balance - Comments: min guard- min A to maintain sitting                                   ADL either performed or assessed with clinical judgement   ADL Overall ADL's : Needs assistance/impaired Eating/Feeding: Set up;Sitting   Grooming: Set up;Sitting   Upper Body Bathing: Set up;Sitting   Lower Body Bathing: Maximal assistance;Sitting/lateral leans;Sit to/from stand   Upper Body Dressing : Set up;Sitting   Lower Body Dressing: Maximal assistance;Sitting/lateral leans;Sit to/from stand   Toilet Transfer: Maximal assistance;Squat-pivot   Toileting- Clothing Manipulation and Hygiene: Maximal assistance;Sitting/lateral lean;Sit to/from stand         General ADL Comments: pt limited sounds like pain     Vision Patient Visual Report: No change from baseline       Perception     Praxis      Pertinent Vitals/Pain Pain Assessment: Faces Faces Pain Scale: Hurts a little bit Pain Location: right residual limb  Pain Descriptors / Indicators: Aching Pain Intervention(s): Monitored during session;Repositioned     Hand Dominance Right   Extremity/Trunk Assessment Upper Extremity Assessment Upper Extremity Assessment: Generalized weakness   Lower  Extremity Assessment Lower Extremity Assessment: Defer to PT evaluation RLE Deficits / Details: was able to lift resiudual limb against gravity  RLE: Unable to fully assess due to pain       Communication Communication Communication: No difficulties   Cognition Arousal/Alertness: Awake/alert Behavior During Therapy: Anxious;Agitated;Impulsive Overall Cognitive Status: Impaired/Different from baseline Area of Impairment: Following commands;Safety/judgement                        Following Commands: Follows one step commands consistently Safety/Judgement: Decreased awareness of safety;Decreased awareness of deficits     General Comments: Patient was pleasent but confused. She at first did not know she was at the hospital. She followed all simple commands. She could recall her name but not her DOB.   General Comments  bandage on the elft leg     Exercises     Shoulder Instructions      Home Living Family/patient expects to be discharged to:: Skilled nursing facility                             Home Equipment: Walker - 2 wheels   Additional Comments: Patient was a limited historian. Per patient she lives at home alone but per chart she came [facility]. She reports she dosent use any device but this again is questionable       Prior Functioning/Environment Level of Independence: Needs assistance  Gait / Transfers Assistance Needed: from previous note was walking limited distances with a walker but mostly used a wheelchair ADL's / Homemaking Assistance Needed: lived at a SNF             OT Problem List: Decreased strength;Decreased knowledge of use of DME or AE;Decreased knowledge of precautions;Decreased activity tolerance;Decreased cognition;Pain;Impaired balance (sitting and/or standing);Decreased safety awareness      OT Treatment/Interventions: Self-care/ADL training;Therapeutic exercise;Patient/family education;Balance training;Energy conservation;Therapeutic activities;DME and/or AE instruction;Cognitive remediation/compensation    OT Goals(Current goals can be found in the care plan section) Acute Rehab OT Goals Patient Stated Goal: unable to state 2ndo to confusion  OT Goal Formulation: Patient unable to participate in goal setting Time For Goal Achievement: 03/05/20 Potential to Achieve Goals: Good  OT Frequency: Min 2X/week   Barriers to D/C:            Co-evaluation PT/OT/SLP  Co-Evaluation/Treatment: Yes Reason for Co-Treatment: Complexity of the patient's impairments (multi-system involvement);For patient/therapist safety;To address functional/ADL transfers PT goals addressed during session: Mobility/safety with mobility;Balance;Proper use of DME;Strengthening/ROM OT goals addressed during session: ADL's and self-care;Strengthening/ROM      AM-PAC OT "6 Clicks" Daily Activity     Outcome Measure Help from another person eating meals?: A Little Help from another person taking care of personal grooming?: A Little Help from another person toileting, which includes using toliet, bedpan, or urinal?: A Lot Help from another person bathing (including washing, rinsing, drying)?: A Lot Help from another person to put on and taking off regular upper body clothing?: A Little Help from another person to put on and taking off regular lower body clothing?: Total 6 Click Score: 14   End of Session Nurse Communication: Mobility status;Precautions  Activity Tolerance: Patient limited by pain Patient left: in bed;with call bell/phone within reach  OT Visit Diagnosis: Unsteadiness on feet (R26.81);Other abnormalities of gait and mobility (R26.89);Muscle weakness (generalized) (M62.81);Pain;Other symptoms and signs involving cognitive function Pain - Right/Left: Right Pain - part of body: Leg (  chest)                Time: 9458-5929 OT Time Calculation (min): 17 min Charges:  OT General Charges $OT Visit: 1 Visit OT Evaluation $OT Eval Moderate Complexity: Keeler, MSOT, OTR/L Acute Rehabilitation Services [facility] Office Number: [phone] Pager: [phone]  Zenovia Jarred 02/20/2020, 5:33 PM

## 2020-02-20 NOTE — Evaluation (Addendum)
Physical Therapy Evaluation Patient Details Name: [name] MRN: [id number] DOB: 03-02-1951 Today's Date: 02/20/2020   History of Present Illness  Patient is a 69 year old female S/P right BKA. She has wonds on the left leg as well. Seh comes from [facility] PMH: hypoglycemia, confused today  ( not sure if this is baseline. Per previous note borderline personality.   Clinical Impression  Patient required significant assist for all mobility today. She required mod a to the edge of the bed and mod a to scoot to the edge of the bed. She required min guard-> min a to remain sitting. She was unable to attempt standing. She was confused but followed all simple commands. She would benefit from further skilled therapy at a SNF as well as acute rehab.     Follow Up Recommendations SNF    Equipment Recommendations  None recommended by PT    Recommendations for Other Services Rehab consult     Precautions / Restrictions Precautions Precautions: Fall Restrictions Weight Bearing Restrictions: Yes RLE Weight Bearing: Non weight bearing      Mobility  Bed Mobility Overal bed mobility: Needs Assistance Bed Mobility: Supine to Sit     Supine to sit: Mod assist     General bed mobility comments: mod a to the edge of the bed. Once sitting min a -> min gaurd to remain sitting. Cuinfg for safety   Transfers                 General transfer comment: Therapy got the patient positioned to stand but patient reported chest pain. She was moved back to a supine psoition anfd then reported no chest pain at all. Nursing was notified.   Ambulation/Gait                Stairs            Wheelchair Mobility    Modified Rankin (Stroke Patients Only)       Balance Overall balance assessment: Needs assistance Sitting-balance support: Bilateral upper extremity supported Sitting balance-Leahy Scale: Poor Sitting balance - Comments: needed close gaurding                                       Pertinent Vitals/Pain Pain Assessment: Faces Faces Pain Scale: Hurts a little bit Pain Location: right residual limb  Pain Descriptors / Indicators: Aching Pain Intervention(s): Limited activity within patient's tolerance;Monitored during session;Patient requesting pain meds-RN notified    Home Living Family/patient expects to be discharged to:: Skilled nursing facility               Home Equipment: Gilford Rile - 2 wheels Additional Comments: Patient was a limited historian. Per patient she lives at home alone but per chart she came from [facility]. She reports she dosent use any device but this again is questionable     Prior Function Level of Independence: Needs assistance   Gait / Transfers Assistance Needed: from rpevious note was walking limited distances with a walker but mostly used a wheelchair   ADL's / Homemaking Assistance Needed: lived at a SNF         Hand Dominance   Dominant Hand: Right    Extremity/Trunk Assessment   Upper Extremity Assessment Upper Extremity Assessment: Defer to OT evaluation    Lower Extremity Assessment Lower Extremity Assessment: RLE deficits/detail RLE Deficits / Details: was able to  lift resiudual limb against gravity  RLE: Unable to fully assess due to pain       Communication   Communication: No difficulties  Cognition Arousal/Alertness: Awake/alert Behavior During Therapy: Anxious;Agitated;Impulsive Overall Cognitive Status: Impaired/Different from baseline Area of Impairment: Following commands;Safety/judgement                       Following Commands: Follows one step commands consistently Safety/Judgement: Decreased awareness of safety;Decreased awareness of deficits     General Comments: Patient was pleasent but confused. She at first did not know she was at the hospital. She followed all simple commands. She could recall her neame but not her DOB.       General  Comments General comments (skin integrity, edema, etc.): bandage on the elft leg     Exercises     Assessment/Plan    PT Assessment Patient needs continued PT services  PT Problem List Decreased range of motion;Decreased strength;Decreased activity tolerance;Decreased balance;Decreased mobility;Decreased knowledge of use of DME;Decreased safety awareness;Decreased cognition;Pain       PT Treatment Interventions DME instruction;Gait training;Stair training;Functional mobility training;Therapeutic activities;Therapeutic exercise;Neuromuscular re-education;Patient/family education    PT Goals (Current goals can be found in the Care Plan section)  Acute Rehab PT Goals Patient Stated Goal: unable to state 2ndo to confusion     Frequency Min 2X/week   Barriers to discharge        Co-evaluation PT/OT/SLP Co-Evaluation/Treatment: Yes Reason for Co-Treatment: Complexity of the patient's impairments (multi-system involvement);Necessary to address cognition/behavior during functional activity;For patient/therapist safety;To address functional/ADL transfers PT goals addressed during session: Mobility/safety with mobility;Balance;Proper use of DME;Strengthening/ROM         AM-PAC PT "6 Clicks" Mobility  Outcome Measure Help needed turning from your back to your side while in a flat bed without using bedrails?: A Lot Help needed moving from lying on your back to sitting on the side of a flat bed without using bedrails?: A Lot Help needed moving to and from a bed to a chair (including a wheelchair)?: Total Help needed standing up from a chair using your arms (e.g., wheelchair or bedside chair)?: Total Help needed to walk in hospital room?: Total Help needed climbing 3-5 steps with a railing? : Total 6 Click Score: 8    End of Session Equipment Utilized During Treatment: Gait belt Activity Tolerance: Patient tolerated treatment well Patient left: with call bell/phone within reach;in  bed;with bed alarm set Nurse Communication: Mobility status PT Visit Diagnosis: Other abnormalities of gait and mobility (R26.89);Muscle weakness (generalized) (M62.81);Unsteadiness on feet (R26.81);Pain Pain - Right/Left: Right Pain - part of body: Leg    Time: 4403-4742 PT Time Calculation (min) (ACUTE ONLY): 17 min   Charges:   PT Evaluation $PT Eval High Complexity: 1 High           Dessie Coma PT DPT  02/20/2020, 2:58 PM

## 2020-02-20 NOTE — Progress Notes (Signed)
Patient ID: [name], female   DOB: 1951/04/16, 69 y.o.   MRN: [id number] Postoperative day 1 right below the knee amputation this was a short below the knee amputation to be proximal to the necrotic soft tissue envelope.  The wound VAC is functioning well no drainage there are 2 checks on the VAC seal leak.  Plan to continue dry dressing changes left leg change daily.  Plan for discharge back to skilled nursing.

## 2020-02-21 NOTE — Plan of Care (Signed)
  Problem: Education: Goal: Knowledge of General Education information will improve Description: Including pain rating scale, medication(s)/side effects and non-pharmacologic comfort measures Outcome: Not Progressing   Problem: Clinical Measurements: Goal: Will remain free from infection Outcome: Progressing   Problem: Pain Managment: Goal: General experience of comfort will improve Outcome: Progressing   Problem: Safety: Goal: Ability to remain free from injury will improve Outcome: Progressing   Problem: Skin Integrity: Goal: Risk for impaired skin integrity will decrease Outcome: Progressing

## 2020-02-21 NOTE — Progress Notes (Signed)
Subjective: 2 Days Post-Op Procedure(s) (LRB): RIGHT BELOW KNEE AMPUTATION (Right) Patient reports pain as moderate to severe.  Complaining of right leg pain .   Objective: Vital signs in last 24 hours: Temp:  [97.9 F (36.6 C)-100 F (37.8 C)] 97.9 F (36.6 C) (06/27 0419) Pulse Rate:  [81-89] 89 (06/27 0419) Resp:  [16-18] 18 (06/27 0419) BP: (92-128)/(50-69) 128/64 (06/27 0419) SpO2:  [94 %-98 %] 98 % (06/27 0419)  Intake/Output from previous day: 06/26 0701 - 06/27 0700 In: 795.3 [I.V.:795.3] Out: 700 [Urine:700] Intake/Output this shift: No intake/output data recorded.  Recent Labs    02/19/20 1317  HGB 9.9*   Recent Labs    02/19/20 1317  WBC 4.8  RBC 3.93  HCT 34.3*  PLT 295   Recent Labs    02/19/20 1317  NA 140  K 4.3  CL 105  CO2 26  BUN 16  CREATININE 0.97  GLUCOSE 82  CALCIUM 8.9   No results for input(s): LABPT, INR in the last 72 hours.    Wound VAC in place right leg.  Left leg dressing intact  Assessment/Plan: 2 Days Post-Op Procedure(s) (LRB): RIGHT BELOW KNEE AMPUTATION (Right) Up with therapy Daily dry dressing changes left leg     [name] 02/21/2020, 7:42 AM

## 2020-02-22 DIAGNOSIS — E1169 Type 2 diabetes mellitus with other specified complication: Secondary | ICD-10-CM | POA: Diagnosis not present

## 2020-02-22 DIAGNOSIS — M86071 Acute hematogenous osteomyelitis, right ankle and foot: Secondary | ICD-10-CM | POA: Diagnosis not present

## 2020-02-22 LAB — SURGICAL PATHOLOGY

## 2020-02-22 MED ORDER — MUPIROCIN 2 % EX OINT
1.0000 "application " | TOPICAL_OINTMENT | Freq: Two times a day (BID) | CUTANEOUS | Status: DC
  Administered 2020-02-22 – 2020-02-23 (×3): 1 via NASAL
  Filled 2020-02-22: qty 22

## 2020-02-22 MED ORDER — CHLORHEXIDINE GLUCONATE CLOTH 2 % EX PADS
6.0000 | MEDICATED_PAD | Freq: Every day | CUTANEOUS | Status: DC
  Administered 2020-02-22 – 2020-02-23 (×2): 6 via TOPICAL

## 2020-02-22 NOTE — TOC Transition Note (Signed)
Transition of Care [facility]) - CM/SW Discharge Note   Patient Details  Name: [name] MRN: [id number] Date of Birth: 09/07/1950  Transition of Care [facility]) CM/SW Contact:  Janae Bridgeman, RN Phone Number: 02/22/2020, 3:14 PM   Clinical Narrative:    Case management spoke with [facility] SNF and patient is currently an active patient with [facility]  I completed the FL2 and spoke with Glendale Chard, CSW and she reported that the patient will be able to return to [facility] once she is medically/surgically clear for discharge.   Final next level of care: Skilled Nursing Facility Barriers to Discharge: No Barriers Identified   Patient Goals and CMS Choice Patient states their goals for this hospitalization and ongoing recovery are:: Patient plans to go back to [facility]. CMS Medicare.gov Compare Post Acute Care list provided to:: Patient Choice offered to / list presented to : Patient  Discharge Placement                       Discharge Plan and Services   Discharge Planning Services: CM Consult                                 Social Determinants of Health (SDOH) Interventions     Readmission Risk Interventions Readmission Risk Prevention Plan 12/29/2019  Transportation Screening Complete  PCP or Specialist Appt within 3-5 Days Complete  HRI or Home Care Consult Complete  Social Work Consult for Recovery Care Planning/Counseling Complete  Palliative Care Screening Not Applicable  Medication Review [employer]) Complete  Some recent data might be hidden

## 2020-02-22 NOTE — Progress Notes (Signed)
Patient is postop day 3 status post right below-knee amputation.  She is awake and appears comfortable.  Vital signs stable last glucose was 114.  0 cc in VAC canister and VAC is functioning well with 2 green checkmarks  Status post above.  Disposition to nursing home.  At first patient states she is not going home to a nursing home and "my mother will take care of me ".  I discussed with her that this is not a good option because she would require more care than the family member could give her.  In the end she agreed.  Plan for discharge to skilled nursing hopefully tomorrow

## 2020-02-22 NOTE — Plan of Care (Signed)

## 2020-02-22 NOTE — NC FL2 (Signed)
[place] MEDICAID FL2 LEVEL OF CARE SCREENING TOOL     IDENTIFICATION  Patient Name: [name] Birthdate: 04/06/1951 Sex: female Admission Date (Current Location): 02/19/2020  [place] and [place] Number:  Haynes Bast [id number] R Facility and Address:  The . [facility], [address] [place], [place] [address]      Provider Number: [id number]  Attending Physician Name and Address:  Nadara Mustard, MD  Relative Name and Phone Number:  [name] [phone]    Current Level of Care: Hospital Recommended Level of Care: Skilled Nursing Facility Prior Approval Number:    Date Approved/Denied:   PASRR Number: [id number]  Discharge Plan: SNF    Current Diagnoses: Patient Active Problem List   Diagnosis Date Noted  . Acute hematogenous osteomyelitis of right foot (HCC) 02/19/2020  . Pseudomonas aeruginosa infection 12/24/2019  . Controlled type 2 diabetes mellitus with circulatory disorder, without long-term current use of insulin (HCC) 12/22/2019  . Osteomyelitis of right fibula (HCC) 12/22/2019  . Severe protein-calorie malnutrition (HCC)   . Venous stasis ulcers of both lower extremities (HCC)   . Bilateral cellulitis of lower leg 08/12/2019  . 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 Pressure injury of skin 06/19/2019  . Elevated C-reactive protein (CRP)   . Leg ulcer (HCC)   . Adjustment disorder with mixed disturbance of emotions and conduct   . Type 2 diabetes mellitus with hyperglycemia (HCC) 05/04/2019  . Infected ulcer of skin (HCC)   . High risk social situation   . Post-polio syndrome   . Chronic pain syndrome   . Limited mobility 04/20/2019  . Venous stasis ulcer (HCC) 03/31/2019  . Overgrown toenails 10/28/2018  . Wound of right leg 02/06/2018  . Atrial fibrillation (HCC) 01/03/2018  . Anemia 01/03/2018  . Elevated serum creatinine 01/03/2018  . Bipolar I disorder with mania (HCC) 01/03/2018    Orientation  RESPIRATION BLADDER Height & Weight     Self, Situation  O2 External catheter Weight: 93 kg Height:  5' 6.5" (168.9 cm)  BEHAVIORAL SYMPTOMS/MOOD NEUROLOGICAL BOWEL NUTRITION STATUS      Continent Diet (See Discharge Summary)  AMBULATORY STATUS COMMUNICATION OF NEEDS Skin   Extensive Assist Verbally Wound Vac, Surgical wounds                       Personal Care Assistance Level of Assistance  Bathing, Dressing Bathing Assistance: Limited assistance   Dressing Assistance: Limited assistance     Functional Limitations Info  Sight, Hearing, Speech Sight Info: Impaired Hearing Info: Adequate Speech Info: Adequate    SPECIAL CARE FACTORS FREQUENCY  PT (By licensed PT), OT (By licensed OT)     PT Frequency: 5 times per week OT Frequency: 5 times per week            Contractures Contractures Info: Not present    Additional Factors Info  Code Status, Allergies, Psychotropic Code Status Info: Full Allergies Info: codeine, tylenol Psychotropic Info: Cymbalta, Neurontin         Current Medications (02/22/2020):  This is the current hospital active medication list Current Facility-Administered Medications  Medication Dose Route Frequency Provider Last Rate Last Admin  . 0.9 %  sodium chloride infusion   Intravenous Continuous Persons, [place], [place] 75 mL/hr at 02/22/20 0941 New Bag at 02/22/20 0941  . Chlorhexidine Gluconate Cloth 2 % PADS 6 each  6 each Topical Q0600 Nadara Mustard, MD   6 each at 02/22/20  1214  . docusate sodium (COLACE) capsule 100 mg  100 mg Oral BID Persons, [place], [place]   100 mg at 02/22/20 0942  . DULoxetine (CYMBALTA) DR capsule 60 mg  60 mg Oral Daily Persons, [place], [place]   60 mg at 02/22/20 0946  . gabapentin (NEURONTIN) capsule 600 mg  600 mg Oral TID Persons, [place], [place]   600 mg at 02/22/20 0946  . HYDROmorphone (DILAUDID) injection 0.5 mg  0.5 mg Intravenous Q4H PRN Persons, [place], [place]      . methocarbamol (ROBAXIN) tablet 500 mg   500 mg Oral Q6H PRN Persons, [place], [place]   500 mg at 02/22/20 0441   Or  . methocarbamol (ROBAXIN) 500 mg in dextrose 5 % 50 mL IVPB  500 mg Intravenous Q6H PRN Persons, [place], [place]      . metoCLOPramide (REGLAN) tablet 5-10 mg  5-10 mg Oral Q8H PRN Persons, [place], [place]       Or  . metoCLOPramide (REGLAN) injection 5-10 mg  5-10 mg Intravenous Q8H PRN Persons, [place], [place]      . metoprolol tartrate (LOPRESSOR) tablet 12.5 mg  12.5 mg Oral BID Persons, [place], [place]   12.5 mg at 02/22/20 0946  . multivitamin (PROSIGHT) tablet 1 tablet  1 tablet Oral Daily Persons, [place], [place]   1 tablet at 02/22/20 0946  . mupirocin ointment (BACTROBAN) 2 % 1 application  1 application Nasal BID Nadara Mustard, MD   1 application at 02/22/20 1232  . nutrition supplement (JUVEN) (JUVEN) powder packet 1 packet  1 packet Oral BID BM Persons, [place], [place]   1 packet at 02/22/20 1232  . ondansetron (ZOFRAN) tablet 4 mg  4 mg Oral Q6H PRN Persons, [place], [place]       Or  . ondansetron [facility]) injection 4 mg  4 mg Intravenous Q6H PRN Persons, [place], [place]      . oxyCODONE (Oxy IR/ROXICODONE) immediate release tablet 5-10 mg  5-10 mg Oral Q4H PRN Persons, [place], [place]   5 mg at 02/22/20 0456     Discharge Medications: Please see discharge summary for a list of discharge medications.  Relevant Imaging Results:  Relevant Lab Results:   Additional Information SSN:[id number]  Janae Bridgeman, RN

## 2020-02-22 NOTE — Plan of Care (Signed)
  Problem: Education: Goal: Knowledge of General Education information will improve Description: Including pain rating scale, medication(s)/side effects and non-pharmacologic comfort measures 02/22/2020 0822 by Lenise Arena, RN Outcome: Progressing 02/22/2020 0812 by Lenise Arena, RN Outcome: Progressing   Problem: Health Behavior/Discharge Planning: Goal: Ability to manage health-related needs will improve 02/22/2020 0822 by Lenise Arena, RN Outcome: Progressing 02/22/2020 0812 by Lenise Arena, RN Outcome: Progressing   Problem: Clinical Measurements: Goal: Ability to maintain clinical measurements within normal limits will improve 02/22/2020 0822 by Lenise Arena, RN Outcome: Progressing 02/22/2020 0812 by Lenise Arena, RN Outcome: Progressing Goal: Will remain free from infection 02/22/2020 0822 by Lenise Arena, RN Outcome: Progressing 02/22/2020 0812 by Lenise Arena, RN Outcome: Progressing Goal: Diagnostic test results will improve 02/22/2020 0822 by Lenise Arena, RN Outcome: Progressing 02/22/2020 0812 by Lenise Arena, RN Outcome: Progressing Goal: Respiratory complications will improve 02/22/2020 0822 by Lenise Arena, RN Outcome: Progressing 02/22/2020 0812 by Lenise Arena, RN Outcome: Progressing Goal: Cardiovascular complication will be avoided 02/22/2020 2633 by Lenise Arena, RN Outcome: Progressing 02/22/2020 0812 by Lenise Arena, RN Outcome: Progressing   Problem: Activity: Goal: Risk for activity intolerance will decrease 02/22/2020 0822 by Lenise Arena, RN Outcome: Progressing 02/22/2020 0812 by Lenise Arena, RN Outcome: Progressing   Problem: Nutrition: Goal: Adequate nutrition will be maintained 02/22/2020 0822 by Lenise Arena, RN Outcome: Progressing 02/22/2020 0812 by Lenise Arena, RN Outcome: Progressing   Problem: Coping: Goal: Level of anxiety will decrease 02/22/2020 0822 by Lenise Arena, RN Outcome: Progressing 02/22/2020 0812 by Lenise Arena, RN Outcome: Progressing   Problem: Elimination: Goal: Will not experience complications related to bowel motility 02/22/2020 0822 by Lenise Arena, RN Outcome: Progressing 02/22/2020 0812 by Lenise Arena, RN Outcome: Progressing Goal: Will not experience complications related to urinary retention 02/22/2020 0822 by Lenise Arena, RN Outcome: Progressing 02/22/2020 0812 by Lenise Arena, RN Outcome: Progressing   Problem: Pain Managment: Goal: General experience of comfort will improve 02/22/2020 0822 by Lenise Arena, RN Outcome: Progressing 02/22/2020 0812 by Lenise Arena, RN Outcome: Progressing   Problem: Safety: Goal: Ability to remain free from injury will improve 02/22/2020 0822 by Lenise Arena, RN Outcome: Progressing 02/22/2020 0812 by Lenise Arena, RN Outcome: Progressing   Problem: Skin Integrity: Goal: Risk for impaired skin integrity will decrease 02/22/2020 0822 by Lenise Arena, RN Outcome: Progressing 02/22/2020 0812 by Lenise Arena, RN Outcome: Progressing

## 2020-02-23 MED ORDER — OXYCODONE HCL 5 MG PO TABS: 5.0000 mg | ORAL_TABLET | ORAL | 0 refills | Status: DC | PRN

## 2020-02-23 NOTE — Plan of Care (Signed)
Problem: Education: Goal: Knowledge of General Education information will improve Description: Including pain rating scale, medication(s)/side effects and non-pharmacologic comfort measures 02/23/2020 1828 by Lenise Arena, RN Outcome: Adequate for Discharge 02/23/2020 1828 by Lenise Arena, RN Outcome: Adequate for Discharge 02/23/2020 1143 by Lenise Arena, RN Outcome: Progressing   Problem: Health Behavior/Discharge Planning: Goal: Ability to manage health-related needs will improve 02/23/2020 1828 by Lenise Arena, RN Outcome: Adequate for Discharge 02/23/2020 1828 by Lenise Arena, RN Outcome: Adequate for Discharge 02/23/2020 1143 by Lenise Arena, RN Outcome: Progressing   Problem: Clinical Measurements: Goal: Ability to maintain clinical measurements within normal limits will improve 02/23/2020 1828 by Lenise Arena, RN Outcome: Adequate for Discharge 02/23/2020 1828 by Lenise Arena, RN Outcome: Adequate for Discharge 02/23/2020 1143 by Lenise Arena, RN Outcome: Progressing Goal: Will remain free from infection 02/23/2020 1828 by Lenise Arena, RN Outcome: Adequate for Discharge 02/23/2020 1828 by Lenise Arena, RN Outcome: Adequate for Discharge 02/23/2020 1143 by Lenise Arena, RN Outcome: Progressing Goal: Diagnostic test results will improve 02/23/2020 1828 by Lenise Arena, RN Outcome: Adequate for Discharge 02/23/2020 1828 by Lenise Arena, RN Outcome: Adequate for Discharge 02/23/2020 1143 by Lenise Arena, RN Outcome: Progressing Goal: Respiratory complications will improve 02/23/2020 1828 by Lenise Arena, RN Outcome: Adequate for Discharge 02/23/2020 1828 by Lenise Arena, RN Outcome: Adequate for Discharge 02/23/2020 1143 by Lenise Arena, RN Outcome: Progressing Goal: Cardiovascular complication will be avoided 02/23/2020 1828 by Lenise Arena, RN Outcome: Adequate for Discharge 02/23/2020 1828 by Lenise Arena, RN Outcome: Adequate for Discharge 02/23/2020  1143 by Lenise Arena, RN Outcome: Progressing   Problem: Activity: Goal: Risk for activity intolerance will decrease 02/23/2020 1828 by Lenise Arena, RN Outcome: Adequate for Discharge 02/23/2020 1828 by Lenise Arena, RN Outcome: Adequate for Discharge 02/23/2020 1143 by Lenise Arena, RN Outcome: Progressing   Problem: Nutrition: Goal: Adequate nutrition will be maintained 02/23/2020 1828 by Lenise Arena, RN Outcome: Adequate for Discharge 02/23/2020 1828 by Lenise Arena, RN Outcome: Adequate for Discharge 02/23/2020 1143 by Lenise Arena, RN Outcome: Progressing   Problem: Coping: Goal: Level of anxiety will decrease 02/23/2020 1828 by Lenise Arena, RN Outcome: Adequate for Discharge 02/23/2020 1828 by Lenise Arena, RN Outcome: Adequate for Discharge 02/23/2020 1143 by Lenise Arena, RN Outcome: Progressing   Problem: Elimination: Goal: Will not experience complications related to bowel motility 02/23/2020 1828 by Lenise Arena, RN Outcome: Adequate for Discharge 02/23/2020 1828 by Lenise Arena, RN Outcome: Adequate for Discharge 02/23/2020 1143 by Lenise Arena, RN Outcome: Progressing Goal: Will not experience complications related to urinary retention 02/23/2020 1828 by Lenise Arena, RN Outcome: Adequate for Discharge 02/23/2020 1828 by Lenise Arena, RN Outcome: Adequate for Discharge 02/23/2020 1143 by Lenise Arena, RN Outcome: Progressing   Problem: Pain Managment: Goal: General experience of comfort will improve 02/23/2020 1828 by Lenise Arena, RN Outcome: Adequate for Discharge 02/23/2020 1828 by Lenise Arena, RN Outcome: Adequate for Discharge 02/23/2020 1143 by Lenise Arena, RN Outcome: Progressing   Problem: Safety: Goal: Ability to remain free from injury will improve 02/23/2020 1828 by Lenise Arena, RN Outcome: Adequate for Discharge 02/23/2020 1828 by Lenise Arena, RN Outcome: Adequate for Discharge 02/23/2020 1143 by Lenise Arena, RN Outcome:  Progressing   Problem: Skin Integrity: Goal: Risk for impaired skin integrity will  decrease 02/23/2020 1828 by Lenise Arena, RN Outcome: Adequate for Discharge 02/23/2020 1828 by Lenise Arena, RN Outcome: Adequate for Discharge 02/23/2020 1143 by Lenise Arena, RN Outcome: Progressing

## 2020-02-23 NOTE — Care Management Important Message (Signed)
Important Message  Patient Details  Name: [name] MRN: [id number] Date of Birth: 02-21-1951   Medicare Important Message Given:  Yes     Dorena Bodo 02/23/2020, 10:39 AM

## 2020-02-23 NOTE — Plan of Care (Signed)

## 2020-02-23 NOTE — Progress Notes (Signed)
Patient is being discharged to [facility], report given to Tiara, RN.  All questions answered.  Wound vac changed over to proveena wound vac.  All personal belongings gathered and taken with patient and patient transported by [facility].

## 2020-02-23 NOTE — Discharge Summary (Signed)
Discharge Diagnoses:  Active Problems:   Acute hematogenous osteomyelitis of right foot [facility])   Surgeries: Procedure(s): RIGHT BELOW KNEE AMPUTATION on 02/19/2020    Consultants:   Discharged Condition: Improved  Hospital Course: [name] is an 69 y.o. female who was admitted 02/19/2020 with a chief complaint of Right Foot Osteomyelitis, with a final diagnosis of Osteomyelitis Right Fibula.  Patient was brought to the operating room on 02/19/2020 and underwent Procedure(s): RIGHT BELOW KNEE AMPUTATION.    Patient was given perioperative antibiotics:  Anti-infectives (From admission, onward)   Start     Dose/Rate Route Frequency Ordered Stop   02/19/20 2200  ceFAZolin (ANCEF) IVPB 2g/100 mL premix        2 g 200 mL/hr over 30 Minutes Intravenous Every 6 hours 02/19/20 1616 02/20/20 1033   02/19/20 1630  metroNIDAZOLE (FLAGYL) tablet 500 mg  Status:  Discontinued        500 mg Oral 3 times daily 02/19/20 1616 02/19/20 1700   02/19/20 1130  ceFAZolin (ANCEF) IVPB 2g/100 mL premix        2 g 200 mL/hr over 30 Minutes Intravenous On call to O.R. 02/19/20 1121 02/19/20 1413    .  Patient was given sequential compression devices, early ambulation, and aspirin for DVT prophylaxis.  Recent vital signs:  Patient Vitals for the past 24 hrs:  BP Temp Temp src Pulse Resp SpO2 Weight  02/23/20 0521 (!) 101/58 98.9 F (37.2 C) Oral 92 17 97 % 93.5 kg  02/22/20 2158 (!) 98/55 -- -- 89 -- -- --  02/22/20 2015 (!) 102/55 99.1 F (37.3 C) Oral 88 20 98 % --  02/22/20 1500 93/63 98.4 F (36.9 C) Oral 86 17 98 % --  02/22/20 0935 (!) 105/50 98.9 F (37.2 C) Oral 87 16 97 % --  .  Recent laboratory studies: No results found.  Discharge Medications:   Allergies as of 02/23/2020      Reactions   Codeine Other (See Comments)   "Gets high"   Tylenol [acetaminophen] Hives      Medication List    STOP taking these medications   doxycycline 100 MG tablet Commonly known as:  VIBRA-TABS   traMADol 50 MG tablet Commonly known as: ULTRAM     TAKE these medications   CERTAVITE/ANTIOXIDANTS PO Take 1 tablet by mouth daily.   DULoxetine 60 MG capsule Commonly known as: CYMBALTA Take 60 mg by mouth daily.   gabapentin 300 MG capsule Commonly known as: NEURONTIN Take 2 capsules (600 mg total) by mouth 3 (three) times daily.   metoprolol tartrate 25 MG tablet Commonly known as: LOPRESSOR Take 0.5 tablets (12.5 mg total) by mouth 2 (two) times daily.   naproxen sodium 220 MG tablet Commonly known as: ALEVE Take 220 mg by mouth daily as needed (pain).   nutrition supplement (JUVEN) Pack Take 1 packet by mouth 2 (two) times daily between meals.   oxyCODONE 5 MG immediate release tablet Commonly known as: Oxy IR/ROXICODONE Take 1-2 tablets (5-10 mg total) by mouth every 4 (four) hours as needed for moderate pain (pain score 4-6).       Diagnostic Studies: No results found.  Patient benefited maximally from their hospital stay and there were no complications.     Disposition: Discharge disposition: 03-Skilled Nursing Facility      Discharge Instructions    Call MD / Call 911   Complete by: As directed    If you experience chest pain or  shortness of breath, CALL 911 and be transported to the hospital emergency room.  If you develope a fever above 101 F, pus (white drainage) or increased drainage or redness at the wound, or calf pain, call your surgeon's office.   Constipation Prevention   Complete by: As directed    Drink plenty of fluids.  Prune juice may be helpful.  You may use a stool softener, such as Colace (over the counter) 100 mg twice a day.  Use MiraLax (over the counter) for constipation as needed.   Diet - low sodium heart healthy   Complete by: As directed    Increase activity slowly as tolerated   Complete by: As directed    Negative Pressure Wound Therapy - Incisional   Complete by: As directed    Show patient how to attach  prevena vac      Follow-up Information    Adonis Huguenin, NP.   Specialty: Orthopedic Surgery Contact information: [address] [place] [address] [phone]        Follow up In 1 week.                Signed: [place] Baraka Klatt 02/23/2020, 7:04 AM

## 2020-02-23 NOTE — TOC Transition Note (Signed)
Transition of Care [facility]) - CM/SW Discharge Note   Patient Details  Name: [name] MRN: [id number] Date of Birth: 21-Dec-1950  Transition of Care [facility]) CM/SW Contact:  Janae Bridgeman, RN Phone Number: 02/23/2020, 2:25 PM   Clinical Narrative:    Case management spoke with [facility] and the patient is ready to be discharged back to the facility - Room 204 - [address] [name], [place] is to call [phone] for report.  [facility] called for transport back to the facility.   Final next level of care: Skilled Nursing Facility Barriers to Discharge: No Barriers Identified   Patient Goals and CMS Choice Patient states their goals for this hospitalization and ongoing recovery are:: Patient plans to go back to [name]. CMS Medicare.gov Compare Post Acute Care list provided to:: Patient Choice offered to / list presented to : Patient  Discharge Placement                       Discharge Plan and Services   Discharge Planning Services: CM Consult                                 Social Determinants of Health (SDOH) Interventions     Readmission Risk Interventions Readmission Risk Prevention Plan 12/29/2019  Transportation Screening Complete  PCP or Specialist Appt within 3-5 Days Complete  HRI or Home Care Consult Complete  Social Work Consult for Recovery Care Planning/Counseling Complete  Palliative Care Screening Not Applicable  Medication Review Oceanographer) Complete  Some recent data might be hidden

## 2020-02-23 NOTE — Plan of Care (Signed)

## 2020-02-23 NOTE — Progress Notes (Signed)
This is a 69 year old woman who is now postop day 4 status post right below-knee amputation.  She is doing well this morning and agreeable to returning to her skilled nursing facility.  Vital signs stable afebrile alert and awake.  Dry dressing has been applied to the left lower extremity.  Right wound VAC in place functioning 0 cc in canister   Patient may discharge to skilled nursing with follow-up in our office in 1 week.  She will discharged with a wound VAC.  I have also written a prescription for pain medication and it is on her chart

## 2020-02-24 NOTE — Addendum Note (Signed)
Addendum  created 02/24/20 1349 by Kaylyn Layer, MD   Clinical Note Signed, Intraprocedure Blocks edited

## 2020-03-01 ENCOUNTER — Encounter (HOSPITAL_COMMUNITY): Payer: Self-pay

## 2020-03-01 ENCOUNTER — Emergency Department (HOSPITAL_COMMUNITY): Payer: Medicare Other

## 2020-03-01 ENCOUNTER — Emergency Department (HOSPITAL_COMMUNITY)
Admission: EM | Admit: 2020-03-01 | Discharge: 2020-03-01 | Disposition: A | Discharge status: Home / Self Care | Payer: Medicare Other | Attending: Emergency Medicine | Admitting: Emergency Medicine

## 2020-03-01 ENCOUNTER — Other Ambulatory Visit: Payer: Self-pay

## 2020-03-01 DIAGNOSIS — Z79899 Other long term (current) drug therapy: Secondary | ICD-10-CM | POA: Insufficient documentation

## 2020-03-01 DIAGNOSIS — L03115 Cellulitis of right lower limb: Secondary | ICD-10-CM | POA: Diagnosis not present

## 2020-03-01 DIAGNOSIS — T8149XA Infection following a procedure, other surgical site, initial encounter: Secondary | ICD-10-CM

## 2020-03-01 DIAGNOSIS — F17210 Nicotine dependence, cigarettes, uncomplicated: Secondary | ICD-10-CM | POA: Insufficient documentation

## 2020-03-01 DIAGNOSIS — Z89511 Acquired absence of right leg below knee: Secondary | ICD-10-CM | POA: Diagnosis not present

## 2020-03-01 DIAGNOSIS — Z7984 Long term (current) use of oral hypoglycemic drugs: Secondary | ICD-10-CM | POA: Diagnosis not present

## 2020-03-01 DIAGNOSIS — E119 Type 2 diabetes mellitus without complications: Secondary | ICD-10-CM | POA: Insufficient documentation

## 2020-03-01 DIAGNOSIS — M79604 Pain in right leg: Secondary | ICD-10-CM | POA: Diagnosis present

## 2020-03-01 DIAGNOSIS — W19XXXA Unspecified fall, initial encounter: Secondary | ICD-10-CM

## 2020-03-01 MED ORDER — DOXYCYCLINE HYCLATE 100 MG PO TABS
100.0000 mg | ORAL_TABLET | Freq: Once | ORAL | Status: AC
  Administered 2020-03-01: 100 mg via ORAL
  Filled 2020-03-01: qty 1

## 2020-03-01 MED ORDER — DOXYCYCLINE HYCLATE 100 MG PO CAPS
100.0000 mg | ORAL_CAPSULE | Freq: Two times a day (BID) | ORAL | 0 refills | Status: DC
Start: 2020-03-01 — End: 2022-06-11

## 2020-03-01 NOTE — ED Notes (Signed)
[facility] called for transportation  

## 2020-03-01 NOTE — ED Notes (Signed)
Patient verbalizes understanding of discharge instructions. Opportunity for questioning and answers were provided. Armband removed by staff, pt discharged from ED by PTAR to return to facility

## 2020-03-01 NOTE — ED Triage Notes (Signed)
Pt BIB [facility] from [facility]. Pt was transferring from bed to wheelchair where she fell. Pt denies any loc or hitting her head. Pt has Right BKA (7/6). Pt brought here due to open wound from BKA after fall.

## 2020-03-01 NOTE — ED Provider Notes (Signed)
[facility] EMERGENCY DEPARTMENT Provider Note   CSN: [id number] Arrival date & time: 03/01/20  1338     History Chief Complaint  Patient presents with  . Fall    [name] is a 69 y.o. female.  Pt presents to the ED today with a fall and right bka stump pain.  Pt had a BKA on 6/25 because of osteomyelitis of the right foot.  She was d/c back to the facility on 6/29.  Pt was transferring from her bed to the wheelchair and fell on her right BKA.  Pt said it does not hurt, but looked like her stitches came out.        Past Medical History:  Diagnosis Date  . Arthritis   . Hypoglycemia   . Pneumonia, pneumococcal [facility])     Patient Active Problem List   Diagnosis Date Noted  . Acute hematogenous osteomyelitis of right foot (HCC) 02/19/2020  . Pseudomonas aeruginosa infection 12/24/2019  . Controlled type 2 diabetes mellitus with circulatory disorder, without long-term current use of insulin (HCC) 12/22/2019  . Osteomyelitis of right fibula (HCC) 12/22/2019  . Severe protein-calorie malnutrition (HCC)   . Venous stasis ulcers of both lower extremities (HCC)   . Bilateral cellulitis of lower leg 08/12/2019  . 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 Pressure injury of skin 06/19/2019  . Elevated C-reactive protein (CRP)   . Leg ulcer (HCC)   . Adjustment disorder with mixed disturbance of emotions and conduct   . Type 2 diabetes mellitus with hyperglycemia (HCC) 05/04/2019  . Infected ulcer of skin (HCC)   . High risk social situation   . Post-polio syndrome   . Chronic pain syndrome   . Limited mobility 04/20/2019  . Venous stasis ulcer (HCC) 03/31/2019  . Overgrown toenails 10/28/2018  . Wound of right leg 02/06/2018  . Atrial fibrillation (HCC) 01/03/2018  . Anemia 01/03/2018  . Elevated serum creatinine 01/03/2018  . Bipolar I disorder with mania (HCC) 01/03/2018    Past Surgical History:  Procedure Laterality Date  .  ABDOMINAL HYSTERECTOMY    . AMPUTATION Right 02/19/2020   Procedure: RIGHT BELOW KNEE AMPUTATION;  Surgeon: Nadara Mustard, MD;  Location: [facility] OR;  Service: Orthopedics;  Laterality: Right;  . CESAREAN SECTION       OB History    Gravida      Para      Term      Preterm      AB      Living  3     SAB      TAB      Ectopic      Multiple      Live Births              Family History  Family history unknown: Yes    Social History   Tobacco Use  . Smoking status: Light Tobacco Smoker    Packs/day: 0.25  . Smokeless tobacco: Never Used  Vaping Use  . Vaping Use: Never used  Substance Use Topics  . Alcohol use: No  . Drug use: No    Home Medications Prior to Admission medications   Medication Sig Start Date End Date Taking? Authorizing Provider  doxycycline (VIBRAMYCIN) 100 MG capsule Take 1 capsule (100 mg total) by mouth 2 (two) times daily. 03/01/20   Jacalyn Lefevre, MD  DULoxetine (CYMBALTA) 60 MG capsule Take 60 mg by mouth daily.    [provider]  gabapentin (NEURONTIN) 300 MG capsule Take 2 capsules (600 mg total) by mouth 3 (three) times daily. 06/29/19   Myrene Buddy, MD  metoprolol tartrate (LOPRESSOR) 25 MG tablet Take 0.5 tablets (12.5 mg total) by mouth 2 (two) times daily. 12/29/19   Mujtaba, Mohammadtokir, MD  Multiple Vitamins-Minerals (CERTAVITE/ANTIOXIDANTS PO) Take 1 tablet by mouth daily.    [provider]  naproxen sodium (ALEVE) 220 MG tablet Take 220 mg by mouth daily as needed (pain).    [provider]  nutrition supplement, JUVEN, (JUVEN) PACK Take 1 packet by mouth 2 (two) times daily between meals. 12/29/19   Mujtaba, Mohammadtokir, MD  oxyCODONE (OXY IR/ROXICODONE) 5 MG immediate release tablet Take 1-2 tablets (5-10 mg total) by mouth every 4 (four) hours as needed for moderate pain (pain score 4-6). 02/23/20   Persons, [place], PA  furosemide (LASIX) 20 MG tablet Take 0.5 tablets (10 mg total) by mouth  daily. Patient not taking: Reported on 04/18/2018 12/10/17 05/04/19  Arthor Captain, PA-C    Allergies    Codeine and Tylenol [acetaminophen]  Review of Systems   Review of Systems  Skin: Positive for wound.  All other systems reviewed and are negative.   Physical Exam Updated Vital Signs BP 117/64   Pulse 74   Temp 98 F (36.7 C) (Oral)   Resp 14   SpO2 96%   Physical Exam Vitals and nursing note reviewed.  Constitutional:      Appearance: Normal appearance.  HENT:     Head: Normocephalic and atraumatic.     Right Ear: External ear normal.     Left Ear: External ear normal.     Nose: Nose normal.     Mouth/Throat:     Mouth: Mucous membranes are moist.     Pharynx: Oropharynx is clear.  Eyes:     Extraocular Movements: Extraocular movements intact.     Conjunctiva/sclera: Conjunctivae normal.     Pupils: Pupils are equal, round, and reactive to light.  Cardiovascular:     Rate and Rhythm: Normal rate and regular rhythm.     Pulses: Normal pulses.     Heart sounds: Normal heart sounds.  Pulmonary:     Effort: Pulmonary effort is normal.     Breath sounds: Normal breath sounds.  Abdominal:     General: Abdomen is flat. Bowel sounds are normal.     Palpations: Abdomen is soft.  Musculoskeletal:     Cervical back: Normal range of motion and neck supple.     Comments: See picture of right BKA stump  Skin:    General: Skin is warm.     Capillary Refill: Capillary refill takes less than 2 seconds.  Neurological:     General: No focal deficit present.     Mental Status: She is alert and oriented to person, place, and time.  Psychiatric:        Mood and Affect: Mood normal.        Behavior: Behavior normal.        Thought Content: Thought content normal.        Judgment: Judgment normal.         ED Results / Procedures / Treatments   Labs (all labs ordered are listed, but only abnormal results are displayed) Labs Reviewed  AEROBIC CULTURE (SUPERFICIAL  SPECIMEN)    EKG EKG Interpretation  Date/Time:  Tuesday March 01 2020 13:46:51 EDT Ventricular Rate:  75 PR Interval:    QRS Duration: 72 QT  Interval:  391 QTC Calculation: 437 R Axis:   84 Text Interpretation: Atrial flutter with predominant 4:1 AV block Borderline right axis deviation Nonspecific repol abnormality, diffuse leads No significant change since last tracing Confirmed by Jacalyn Lefevre [phone]) on 03/01/2020 2:01:08 PM   Radiology DG Tibia/Fibula Right  Result Date: 03/01/2020 CLINICAL DATA:  69 year old female with fall. Patient has a right lower extremity below-knee amputation. EXAM: RIGHT TIBIA AND FIBULA - 2 VIEW COMPARISON:  Right lower extremity radiograph dated 12/22/2019. FINDINGS: There is a below-knee amputation. The amputated edges are sharp. There is no acute fracture or dislocation. The bones are osteopenic. No significant arthritic changes. A small suprapatellar effusion may be present. There is mild diffuse subcutaneous edema. Cutaneous clips noted over the stump. No soft tissue gas. IMPRESSION: 1. No acute fracture or dislocation. 2. Below-knee amputation. 3. Mild diffuse subcutaneous edema. Electronically Signed   By: Elgie Collard M.D.   On: 03/01/2020 15:04    Procedures Procedures (including critical care time)  Medications Ordered in ED Medications  doxycycline (VIBRA-TABS) tablet 100 mg (has no administration in time range)    ED Course  I have reviewed the triage vital signs and the nursing notes.  Pertinent labs & imaging results that were available during my care of the patient were reviewed by me and considered in my medical decision making (see chart for details).    MDM Rules/Calculators/A&P                          Pt d/w Dr. Cleophas Dunker (on call for Dr. Lajoyce Corners).  He recommended a wound culture and abx.  Pt to f/u with Dr. Lajoyce Corners.  Final Clinical Impression(s) / ED Diagnoses Final diagnoses:  Fall, initial encounter  Wound cellulitis  after surgery    Rx / DC Orders ED Discharge Orders         Ordered    doxycycline (VIBRAMYCIN) 100 MG capsule  2 times daily     Discontinue  Reprint     03/01/20 1547           Jacalyn Lefevre, MD 03/01/20 1548

## 2020-03-04 LAB — AEROBIC CULTURE W GRAM STAIN (SUPERFICIAL SPECIMEN): Gram Stain: NONE SEEN

## 2020-03-05 ENCOUNTER — Telehealth: Payer: Self-pay | Admitting: Emergency Medicine

## 2020-03-05 NOTE — Telephone Encounter (Signed)
Post ED Visit - Positive Culture Follow-up  Culture report reviewed by antimicrobial stewardship pharmacist: [facility] []  , Pharm.D. []  Enzo Bi, Pharm.D., BCPS AQ-ID []  , Pharm.D., BCPS []  Celedonio Miyamoto, Pharm.D., BCPS []  [place], Garvin Fila.D., BCPS, AAHIVP []  , Pharm.D., BCPS, AAHIVP []  Georgina Pillion, PharmD, BCPS []  , PharmD, BCPS []  [place], PharmD, BCPS []  [place], PharmD []  , PharmD, BCPS [x]  Estella Husk, PharmD  Pharmacy Team []  Lysle Pearl, PharmD []  , PharmD []  Phillips Climes, PharmD []  , Rph []  Agapito Games) , PharmD []  Verlan Friends, PharmD []  , PharmD []  Mervyn Gay, PharmD []  , PharmD []  Vinnie Level, PharmD []  [facility], PharmD []  , PharmD []  Len Childs, PharmD   Positive aerobic culture Treated with Doxycycline, organism sensitive to the same and no further patient follow-up is required at this time.  Jacyln Carmer 03/05/2020, 12:34 PM

## 2020-03-08 ENCOUNTER — Telehealth: Payer: Self-pay

## 2020-03-08 NOTE — Telephone Encounter (Signed)
Please advise, thank you.

## 2020-03-08 NOTE — Telephone Encounter (Signed)
Tammy, treatment nurse with [facility] would like to know if she can apply xeroform dressing to right stump until she is seen on Wednesday, 03/09/2020.  Cb#[phone]. Stated that you can ask for her or [place].  Please advise.  Thank you.

## 2020-03-08 NOTE — Telephone Encounter (Signed)
Called facility no xeroform

## 2020-03-08 NOTE — Telephone Encounter (Signed)
Please advise, Is this okay?

## 2020-03-08 NOTE — Telephone Encounter (Signed)
Tammy was called and understood orders to use guaze and tape until patient is seen on Thursday.

## 2020-03-08 NOTE — Telephone Encounter (Signed)
Laney with [facility] was calling back wanting to know if they could use Coban due to not having ace bandages.  Cb# [phone].  Please advise.  Thank you.

## 2020-03-08 NOTE — Telephone Encounter (Signed)
They an use hypafix tape

## 2020-03-09 ENCOUNTER — Ambulatory Visit: Payer: Medicare Other | Admitting: Physician Assistant

## 2020-03-10 ENCOUNTER — Encounter: Payer: Self-pay | Admitting: Physician Assistant

## 2020-03-10 ENCOUNTER — Ambulatory Visit (INDEPENDENT_AMBULATORY_CARE_PROVIDER_SITE_OTHER): Payer: Medicare Other | Admitting: Orthopedic Surgery

## 2020-03-10 VITALS — Ht 66.5 in | Wt 206.1 lb

## 2020-03-10 DIAGNOSIS — Z89511 Acquired absence of right leg below knee: Secondary | ICD-10-CM

## 2020-03-10 MED ORDER — DOXYCYCLINE HYCLATE 100 MG PO TABS: 100.0000 mg | ORAL_TABLET | Freq: Two times a day (BID) | ORAL | 0 refills | Status: DC

## 2020-03-15 ENCOUNTER — Other Ambulatory Visit: Payer: Self-pay

## 2020-03-15 ENCOUNTER — Ambulatory Visit (INDEPENDENT_AMBULATORY_CARE_PROVIDER_SITE_OTHER): Payer: Medicare Other | Admitting: Family

## 2020-03-15 ENCOUNTER — Encounter: Payer: Self-pay | Admitting: Family

## 2020-03-15 VITALS — Ht 66.5 in | Wt 206.1 lb

## 2020-03-15 DIAGNOSIS — Z89511 Acquired absence of right leg below knee: Secondary | ICD-10-CM

## 2020-03-16 ENCOUNTER — Encounter: Payer: Self-pay | Admitting: Family

## 2020-03-16 NOTE — Progress Notes (Signed)
   Post-Op Visit Note   Patient: [name]           Date of Birth: 1951/05/08           MRN: [id number] Visit Date: 03/15/2020 PCP: [name], DO  Chief Complaint:  Chief Complaint  Patient presents with  . Right Leg - Routine Post Op    02/19/20 RBKA     HPI:  HPI The patient is a 69 year old woman who after this.  Presents today status post right below-knee amputation she has been residing at the skilled nursing facility.  They have been doing dressing changes with Coban Kerlix and A&E ointment  Ortho Exam Incision well approximated with sutures and staples she has minimal drainage there is no surrounding erythema no odor no sign of infection  Visit Diagnoses:  1. Acquired absence of right leg below knee (HCC)     Plan: Begin daily Dial soap cleansing.  Dry dressing changes.  Shrinker daily.  She will follow-up in 2 weeks for suture removal.  Follow-Up Instructions: No follow-ups on file.   Imaging: No results found.  Orders:  No orders of the defined types were placed in this encounter.  No orders of the defined types were placed in this encounter.    PMFS History: Patient Active Problem List   Diagnosis Date Noted  . Acute hematogenous osteomyelitis of right foot (HCC) 02/19/2020  . Pseudomonas aeruginosa infection 12/24/2019  . Controlled type 2 diabetes mellitus with circulatory disorder, without long-term current use of insulin (HCC) 12/22/2019  . Osteomyelitis of right fibula (HCC) 12/22/2019  . Severe protein-calorie malnutrition (HCC)   . Venous stasis ulcers of both lower extremities (HCC)   . Bilateral cellulitis of lower leg 08/12/2019  . 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 Pressure injury of skin 06/19/2019  . Elevated C-reactive protein (CRP)   . Leg ulcer (HCC)   . Adjustment disorder with mixed disturbance of emotions and conduct   . Type 2 diabetes mellitus with hyperglycemia (HCC) 05/04/2019  . Infected ulcer  of skin (HCC)   . High risk social situation   . Post-polio syndrome   . Chronic pain syndrome   . Limited mobility 04/20/2019  . Venous stasis ulcer (HCC) 03/31/2019  . Overgrown toenails 10/28/2018  . Wound of right leg 02/06/2018  . Atrial fibrillation (HCC) 01/03/2018  . Anemia 01/03/2018  . Elevated serum creatinine 01/03/2018  . Bipolar I disorder with mania (HCC) 01/03/2018   Past Medical History:  Diagnosis Date  . Arthritis   . Hypoglycemia   . Pneumonia, pneumococcal (HCC)     Family History  Family history unknown: Yes    Past Surgical History:  Procedure Laterality Date  . ABDOMINAL HYSTERECTOMY    . AMPUTATION Right 02/19/2020   Procedure: RIGHT BELOW KNEE AMPUTATION;  Surgeon: Nadara Mustard, MD;  Location: [facility] OR;  Service: Orthopedics;  Laterality: Right;  . CESAREAN SECTION     Social History   Occupational History  . Not on file  Tobacco Use  . Smoking status: Light Tobacco Smoker    Packs/day: 0.25  . Smokeless tobacco: Never Used  Vaping Use  . Vaping Use: Never used  Substance and Sexual Activity  . Alcohol use: No  . Drug use: No  . Sexual activity: Not on file

## 2020-03-24 ENCOUNTER — Telehealth: Payer: Self-pay | Admitting: Orthopedic Surgery

## 2020-03-24 NOTE — Telephone Encounter (Signed)
Tammy @ [facility] states patient removed the ace bandage and they are wanting to know if they could get an order to use coban bandage instead of putting the ace bandage back on, because  Patient can easily remove it.    Tammy's call back # [phone] or [phone]

## 2020-03-24 NOTE — Telephone Encounter (Signed)
Pt is s/p a BKA I called and lm on vm to advise that if the pt has one she should be using her shrinker and if not to continue with the ace bandage as coban can cut into the skin. To call with any questions.

## 2020-03-28 ENCOUNTER — Encounter: Payer: Self-pay | Admitting: Orthopedic Surgery

## 2020-03-28 NOTE — Progress Notes (Signed)
Office Visit Note   Patient: [name]           Date of Birth: [birth year]           MRN: [id number] Visit Date: 03/10/2020              Requested by: [name], DO [address] [place],  [place] [address] PCP: [name], DO  Chief Complaint  Patient presents with  . Right Leg - Routine Post Op    02/19/20 RBKA      HPI: Patient is a 69 year old woman who is 3 weeks status post right transtibial amputation patient states that she fell last week went to the emergency room.  This is her first visit postoperatively.  Patient is currently residing at [facility] skilled nursing  Assessment & Plan: Visit Diagnoses:  1. History of right below knee amputation [facility])     Plan: Patient will continue with routine wound care follow-up in 1 week.  Follow-Up Instructions: Return in about 1 week (around 03/17/2020).   Ortho Exam  Patient is alert, oriented, no adenopathy, well-dressed, normal affect, normal respiratory effort. Patient does have some dermatitis in her right transtibial amputation there is an area of granulation tissue that is 3 cm in diameter 1 mm deep there is no drainage no exposed bone or tendon no signs of infection.  Imaging: No results found. No images are attached to the encounter.  Labs: Lab Results  Component Value Date   HGBA1C 6.1 (H) 12/22/2019   HGBA1C 6.6 (H) 05/04/2019   ESRSEDRATE 60 (H) 12/28/2019   ESRSEDRATE 102 (H) 12/22/2019   ESRSEDRATE 129 (H) 06/18/2019   CRP 1.1 (H) 12/28/2019   CRP 3.9 (H) 12/22/2019   CRP 21.8 (H) 06/18/2019   REPTSTATUS 03/04/2020 FINAL 03/01/2020   GRAMSTAIN NO WBC SEEN NO ORGANISMS SEEN  03/01/2020   CULT  03/01/2020    RARE CORYNEBACTERIUM STRIATUM Standardized susceptibility testing for this organism is not available. Performed at [facility], [address] [place], [place] [address]    LABORGA PSEUDOMONAS AERUGINOSA 12/22/2019   LABORGA PROTEUS MIRABILIS 12/22/2019       Lab Results  Component Value Date   ALBUMIN 2.9 (L) 12/23/2019   ALBUMIN 3.0 (L) 12/21/2019   ALBUMIN 2.7 (L) 11/09/2019   PREALBUMIN 12.5 (L) 12/22/2019    Lab Results  Component Value Date   MG 1.9 06/19/2019   No results found for: [facility]  Lab Results  Component Value Date   PREALBUMIN 12.5 (L) 12/22/2019   CBC EXTENDED Latest Ref Rng & Units 02/19/2020 12/29/2019 12/28/2019  WBC 4.0 - 10.5 K/uL 4.8 3.8(L) 4.0  RBC 3.87 - 5.11 MIL/uL 3.93 3.49(L) 3.46(L)  HGB 12.0 - 15.0 g/dL 8.2(N) 5.6(O) 1.3(Y)  HCT 36 - 46 % 34.3(L) 29.8(L) 30.0(L)  PLT 150 - 400 K/uL 295 PLATELET CLUMPS NOTED ON SMEAR, UNABLE TO ESTIMATE 225  NEUTROABS 1.7 - 7.7 K/uL - - -  LYMPHSABS 0.7 - 4.0 K/uL - - -     Body mass index is 32.77 kg/m.  Orders:  No orders of the defined types were placed in this encounter.  Meds ordered this encounter  Medications  . doxycycline (VIBRA-TABS) 100 MG tablet    Sig: Take 1 tablet (100 mg total) by mouth 2 (two) times daily.    Dispense:  60 tablet    Refill:  0     Procedures: No procedures performed  Clinical Data: No additional  findings.  ROS:  All other systems negative, except as noted in the HPI. Review of Systems  Objective: Vital Signs: Ht 5' 6.5" (1.689 m)   Wt 206 lb 2.1 oz (93.5 kg)   BMI 32.77 kg/m   Specialty Comments:  No specialty comments available.  PMFS History: Patient Active Problem List   Diagnosis Date Noted  . Acute hematogenous osteomyelitis of right foot (HCC) 02/19/2020  . Pseudomonas aeruginosa infection 12/24/2019  . Controlled type 2 diabetes mellitus with circulatory disorder, without long-term current use of insulin (HCC) 12/22/2019  . Osteomyelitis of right fibula (HCC) 12/22/2019  . Severe protein-calorie malnutrition (HCC)   . Venous stasis ulcers of both lower extremities (HCC)   . Bilateral cellulitis of lower leg 08/12/2019  . 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 Pressure injury  of skin 06/19/2019  . Elevated C-reactive protein (CRP)   . Leg ulcer (HCC)   . Adjustment disorder with mixed disturbance of emotions and conduct   . Type 2 diabetes mellitus with hyperglycemia (HCC) 05/04/2019  . Infected ulcer of skin (HCC)   . High risk social situation   . Post-polio syndrome   . Chronic pain syndrome   . Limited mobility 04/20/2019  . Venous stasis ulcer (HCC) 03/31/2019  . Overgrown toenails 10/28/2018  . Wound of right leg 02/06/2018  . Atrial fibrillation (HCC) 01/03/2018  . Anemia 01/03/2018  . Elevated serum creatinine 01/03/2018  . Bipolar I disorder with mania (HCC) 01/03/2018   Past Medical History:  Diagnosis Date  . Arthritis   . Hypoglycemia   . Pneumonia, pneumococcal (HCC)     Family History  Family history unknown: Yes    Past Surgical History:  Procedure Laterality Date  . ABDOMINAL HYSTERECTOMY    . AMPUTATION Right 02/19/2020   Procedure: RIGHT BELOW KNEE AMPUTATION;  Surgeon: Nadara Mustard, MD;  Location[facility] OR;  Service: Orthopedics;  Laterality: Right;  . CESAREAN SECTION     Social History   Occupational History  . Not on file  Tobacco Use  . Smoking status: Light Tobacco Smoker    Packs/day: 0.25  . Smokeless tobacco: Never Used  Vaping Use  . Vaping Use: Never used  Substance and Sexual Activity  . Alcohol use: No  . Drug use: No  . Sexual activity: Not on file

## 2020-03-29 ENCOUNTER — Encounter: Payer: Self-pay | Admitting: Physician Assistant

## 2020-03-29 ENCOUNTER — Ambulatory Visit: Payer: Medicare Other | Admitting: Family

## 2020-03-29 ENCOUNTER — Ambulatory Visit (INDEPENDENT_AMBULATORY_CARE_PROVIDER_SITE_OTHER): Payer: Medicare Other | Admitting: Physician Assistant

## 2020-03-29 DIAGNOSIS — Z89511 Acquired absence of right leg below knee: Secondary | ICD-10-CM

## 2020-03-29 NOTE — Progress Notes (Signed)
Office Visit Note   Patient: [name]           Date of Birth: Dec 18, 1950           MRN: [id number] Visit Date: 03/29/2020              Requested by: [name], DO [address] [place],  [place] [address] PCP: [name], DO  No chief complaint on file.     HPI: Patient is 6 weeks status post right below-knee amputation.  She also has significant venous stasis ulcers on her left lower extremity.  She is here for routine follow-up.  She is quite confused and thinks that she is being cared for at home by her father and her mother.  She denies being in a nursing facility  Assessment & Plan: Visit Diagnoses: No diagnosis found.  Plan: He will continue to do dressing changes on the right hopefully they can get her into her shrinker.  Surgical sutures and staples were harvested today.  On the left side I do not see great progression of healing of her ulcers.  Ideally would be good if she could get into a compression stocking.  She continue to do daily dressing changes on the left as well.  We will follow up in 2 weeks  Follow-Up Instructions: No follow-ups on file.   Ortho Exam  Patient is alert, oriented, no adenopathy, well-dressed, normal affect, normal respiratory effort. Right lower extremity amputation stump is completely healed.  No dehiscence overall swelling is well controlled.  No foul odor.  She did have some bloody drainage with removal of staples secondary to being on blood thinner hemostasis was achieved with silver nitrate stick and a compressive dressing was applied  On the left side she has a significant ulcer in the posterior aspect of her ankle.  There is no foul odor.  This is been treating with silver cell also one on her lateral ankle.  With fibrinous tissue. No ascending cellulitis or foul odor Imaging: No results found. No images are attached to the encounter.  Labs: Lab Results  Component Value Date   HGBA1C 6.1 (H)  12/22/2019   HGBA1C 6.6 (H) 05/04/2019   ESRSEDRATE 60 (H) 12/28/2019   ESRSEDRATE 102 (H) 12/22/2019   ESRSEDRATE 129 (H) 06/18/2019   CRP 1.1 (H) 12/28/2019   CRP 3.9 (H) 12/22/2019   CRP 21.8 (H) 06/18/2019   REPTSTATUS 03/04/2020 FINAL 03/01/2020   GRAMSTAIN NO WBC SEEN NO ORGANISMS SEEN  03/01/2020   CULT  03/01/2020    RARE CORYNEBACTERIUM STRIATUM Standardized susceptibility testing for this organism is not available. Performed at [facility], [address], [place], [place] [address]    LABORGA PSEUDOMONAS AERUGINOSA 12/22/2019   LABORGA PROTEUS MIRABILIS 12/22/2019     Lab Results  Component Value Date   ALBUMIN 2.9 (L) 12/23/2019   ALBUMIN 3.0 (L) 12/21/2019   ALBUMIN 2.7 (L) 11/09/2019   PREALBUMIN 12.5 (L) 12/22/2019    Lab Results  Component Value Date   MG 1.9 06/19/2019   No results found for: [facility]  Lab Results  Component Value Date   PREALBUMIN 12.5 (L) 12/22/2019   CBC EXTENDED Latest Ref Rng & Units 02/19/2020 12/29/2019 12/28/2019  WBC 4.0 - 10.5 K/uL 4.8 3.8(L) 4.0  RBC 3.87 - 5.11 MIL/uL 3.93 3.49(L) 3.46(L)  HGB 12.0 - 15.0 g/dL 3.5(W) 6.5(K) 8.1(E)  HCT 36 - 46 % 34.3(L) 29.8(L) 30.0(L)  PLT 150 - 400  K/uL 295 PLATELET CLUMPS NOTED ON SMEAR, UNABLE TO ESTIMATE 225  NEUTROABS 1.7 - 7.7 K/uL - - -  LYMPHSABS 0.7 - 4.0 K/uL - - -     There is no height or weight on file to calculate BMI.  Orders:  No orders of the defined types were placed in this encounter.  No orders of the defined types were placed in this encounter.    Procedures: No procedures performed  Clinical Data: No additional findings.  ROS:  All other systems negative, except as noted in the HPI. Review of Systems  Objective: Vital Signs: There were no vitals taken for this visit.  Specialty Comments:  No specialty comments available.  PMFS History: Patient Active Problem List   Diagnosis Date Noted  . Acute hematogenous osteomyelitis of right foot  (HCC) 02/19/2020  . Pseudomonas aeruginosa infection 12/24/2019  . Controlled type 2 diabetes mellitus with circulatory disorder, without long-term current use of insulin (HCC) 12/22/2019  . Osteomyelitis of right fibula (HCC) 12/22/2019  . Severe protein-calorie malnutrition (HCC)   . Venous stasis ulcers of both lower extremities (HCC)   . Bilateral cellulitis of lower leg 08/12/2019  . 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 Pressure injury of skin 06/19/2019  . Elevated C-reactive protein (CRP)   . Leg ulcer (HCC)   . Adjustment disorder with mixed disturbance of emotions and conduct   . Type 2 diabetes mellitus with hyperglycemia (HCC) 05/04/2019  . Infected ulcer of skin (HCC)   . High risk social situation   . Post-polio syndrome   . Chronic pain syndrome   . Limited mobility 04/20/2019  . Venous stasis ulcer (HCC) 03/31/2019  . Overgrown toenails 10/28/2018  . Wound of right leg 02/06/2018  . Atrial fibrillation (HCC) 01/03/2018  . Anemia 01/03/2018  . Elevated serum creatinine 01/03/2018  . Bipolar I disorder with mania (HCC) 01/03/2018   Past Medical History:  Diagnosis Date  . Arthritis   . Hypoglycemia   . Pneumonia, pneumococcal (HCC)     Family History  Family history unknown: Yes    Past Surgical History:  Procedure Laterality Date  . ABDOMINAL HYSTERECTOMY    . AMPUTATION Right 02/19/2020   Procedure: RIGHT BELOW KNEE AMPUTATION;  Surgeon: Nadara Mustard, MD;  Location: [facility] OR;  Service: Orthopedics;  Laterality: Right;  . CESAREAN SECTION     Social History   Occupational History  . Not on file  Tobacco Use  . Smoking status: Light Tobacco Smoker    Packs/day: 0.25  . Smokeless tobacco: Never Used  Vaping Use  . Vaping Use: Never used  Substance and Sexual Activity  . Alcohol use: No  . Drug use: No  . Sexual activity: Not on file

## 2020-03-30 ENCOUNTER — Telehealth: Payer: Self-pay | Admitting: Orthopedic Surgery

## 2020-03-30 NOTE — Telephone Encounter (Signed)
I called and sw Tammy to advise that yes we would be treating her for bilat lower extremities she was evaluated in the office yesterday and to continue with the shrinker on the right and daily dressing changes on the left and ideally would like to get her into a compression sock will follow up in 2 weeks. Will call with any questions.

## 2020-03-30 NOTE — Telephone Encounter (Signed)
[name] with [facility] called wanting to make sure we would be treating the pt for her left leg because prior we had only been seeing her for a BKA on the right leg.  [name] CB# [phone] If unable to reach she said it's ok for you to text her  [phone]

## 2020-04-12 ENCOUNTER — Encounter: Payer: Self-pay | Admitting: Physician Assistant

## 2020-04-12 ENCOUNTER — Ambulatory Visit (INDEPENDENT_AMBULATORY_CARE_PROVIDER_SITE_OTHER): Payer: Medicare Other | Admitting: Physician Assistant

## 2020-04-12 VITALS — Ht 66.5 in | Wt 206.1 lb

## 2020-04-12 DIAGNOSIS — Z89511 Acquired absence of right leg below knee: Secondary | ICD-10-CM

## 2020-04-12 NOTE — Progress Notes (Signed)
Office Visit Note   Patient: [name]           Date of Birth: Apr 02, 1951           MRN: [id number] Visit Date: 04/12/2020              Requested by: [name], DO [address] [place],  [place] [address] PCP: [name], DO  Chief Complaint  Patient presents with   Right Leg - Follow-up    02/19/20 Right BKA      HPI: This is a pleasant woman who is 7 weeks status post right below-knee amputation.  She is currently residing in a nursing home.  Overall she is doing well  Assessment & Plan: Visit Diagnoses: No diagnosis found.  Plan: I have ordered shrinkers to be used and changed daily.  Follow-up in 2 weeks.  Follow-Up Instructions: No follow-ups on file.   Ortho Exam  Patient is alert, oriented, no adenopathy, well-dressed, normal affect, normal respiratory effort. Right below-knee amputation stump is healing well there is some eschar but there is no surrounding cellulitis erythema well-healed surgical incision no sign of infection  Imaging: No results found. No images are attached to the encounter.  Labs: Lab Results  Component Value Date   HGBA1C 6.1 (H) 12/22/2019   HGBA1C 6.6 (H) 05/04/2019   ESRSEDRATE 60 (H) 12/28/2019   ESRSEDRATE 102 (H) 12/22/2019   ESRSEDRATE 129 (H) 06/18/2019   CRP 1.1 (H) 12/28/2019   CRP 3.9 (H) 12/22/2019   CRP 21.8 (H) 06/18/2019   REPTSTATUS 03/04/2020 FINAL 03/01/2020   GRAMSTAIN NO WBC SEEN NO ORGANISMS SEEN  03/01/2020   CULT  03/01/2020    RARE CORYNEBACTERIUM STRIATUM Standardized susceptibility testing for this organism is not available. Performed at [facility], [address] [place], [place] [address]    LABORGA PSEUDOMONAS AERUGINOSA 12/22/2019   LABORGA PROTEUS MIRABILIS 12/22/2019     Lab Results  Component Value Date   ALBUMIN 2.9 (L) 12/23/2019   ALBUMIN 3.0 (L) 12/21/2019   ALBUMIN 2.7 (L) 11/09/2019   PREALBUMIN 12.5 (L) 12/22/2019    Lab Results    Component Value Date   MG 1.9 06/19/2019   No results found for: [facility]  Lab Results  Component Value Date   PREALBUMIN 12.5 (L) 12/22/2019   CBC EXTENDED Latest Ref Rng & Units 02/19/2020 12/29/2019 12/28/2019  WBC 4.0 - 10.5 K/uL 4.8 3.8(L) 4.0  RBC 3.87 - 5.11 MIL/uL 3.93 3.49(L) 3.46(L)  HGB 12.0 - 15.0 g/dL 6.0(A) 0.0(K) 5.9(X)  HCT 36 - 46 % 34.3(L) 29.8(L) 30.0(L)  PLT 150 - 400 K/uL 295 PLATELET CLUMPS NOTED ON SMEAR, UNABLE TO ESTIMATE 225  NEUTROABS 1.7 - 7.7 K/uL - - -  LYMPHSABS 0.7 - 4.0 K/uL - - -     Body mass index is 32.77 kg/m.  Orders:  No orders of the defined types were placed in this encounter.  No orders of the defined types were placed in this encounter.    Procedures: No procedures performed  Clinical Data: No additional findings.  ROS:  All other systems negative, except as noted in the HPI. Review of Systems  Objective: Vital Signs: Ht 5' 6.5" (1.689 m)    Wt 206 lb 2.1 oz (93.5 kg)    BMI 32.77 kg/m   Specialty Comments:  No specialty comments available.  PMFS History: Patient Active Problem List   Diagnosis Date Noted   Acute hematogenous osteomyelitis of  right foot (HCC) 02/19/2020   Pseudomonas aeruginosa infection 12/24/2019   Controlled type 2 diabetes mellitus with circulatory disorder, without long-term current use of insulin (HCC) 12/22/2019   Osteomyelitis of right fibula (HCC) 12/22/2019   Severe protein-calorie malnutrition (HCC)    Venous stasis ulcers of both lower extremities (HCC)    Bilateral cellulitis of lower leg 08/12/2019   Depression, major, recurrent, mild (HCC) 06/21/2019   Pressure injury of skin 06/19/2019   Elevated C-reactive protein (CRP)    Leg ulcer (HCC)    Adjustment disorder with mixed disturbance of emotions and conduct    Type 2 diabetes mellitus with hyperglycemia (HCC) 05/04/2019   Infected ulcer of skin (HCC)    High risk social situation    Post-polio syndrome    Chronic  pain syndrome    Limited mobility 04/20/2019   Venous stasis ulcer (HCC) 03/31/2019   Overgrown toenails 10/28/2018   Wound of right leg 02/06/2018   Atrial fibrillation (HCC) 01/03/2018   Anemia 01/03/2018   Elevated serum creatinine 01/03/2018   Bipolar I disorder with mania (HCC) 01/03/2018   Past Medical History:  Diagnosis Date   Arthritis    Hypoglycemia    Pneumonia, pneumococcal (HCC)     Family History  Family history unknown: Yes    Past Surgical History:  Procedure Laterality Date   ABDOMINAL HYSTERECTOMY     AMPUTATION Right 02/19/2020   Procedure: RIGHT BELOW KNEE AMPUTATION;  Surgeon: Nadara Mustard, MD;  Location: [facility] OR;  Service: Orthopedics;  Laterality: Right;   CESAREAN SECTION     Social History   Occupational History   Not on file  Tobacco Use   Smoking status: Light Tobacco Smoker    Packs/day: 0.25   Smokeless tobacco: Never Used  Vaping Use   Vaping Use: Never used  Substance and Sexual Activity   Alcohol use: No   Drug use: No   Sexual activity: Not on file

## 2020-04-25 ENCOUNTER — Encounter (HOSPITAL_BASED_OUTPATIENT_CLINIC_OR_DEPARTMENT_OTHER): Payer: Medicare Other | Admitting: Internal Medicine

## 2020-04-26 ENCOUNTER — Encounter: Payer: Self-pay | Admitting: Physician Assistant

## 2020-04-26 ENCOUNTER — Ambulatory Visit (INDEPENDENT_AMBULATORY_CARE_PROVIDER_SITE_OTHER): Payer: Medicare Other | Admitting: Physician Assistant

## 2020-04-26 DIAGNOSIS — Z89511 Acquired absence of right leg below knee: Secondary | ICD-10-CM

## 2020-04-26 NOTE — Progress Notes (Signed)
Office Visit Note   Patient: [name]           Date of Birth: Nov 11, 1950           MRN: [id number] Visit Date: 04/26/2020              Requested by: Caroline Jim, DO [address] [place],  [place] [address] PCP: Caroline Jim, DO  Chief Complaint  Patient presents with  . Right Leg - Routine Post Op      HPI: Patient presents today 2 months status post right below-knee amputation.  She is at a nursing facility.  She does not have any complaints.  She has not accompanied by a staff member  Assessment & Plan: Visit Diagnoses: No diagnosis found.  Plan: I wrote an order for her physical therapy to evaluate her ability to use a transfer leg.  From what I gather most the uses her wheelchair prior to surgery.  I will await their response and if she is a good candidate I will forward a prescription to [facility]  Follow-Up Instructions: No follow-ups on file.   Ortho Exam  Patient is alert, oriented, no adenopathy, well-dressed, normal affect, normal respiratory effort. Well-healed surgical incision no cellulitis no erythema no necrosis.  She actually has fairly good range of motion of her leg.  No tenderness to palpation Imaging: No results found. No images are attached to the encounter.  Labs: Lab Results  Component Value Date   HGBA1C 6.1 (H) 12/22/2019   HGBA1C 6.6 (H) 05/04/2019   ESRSEDRATE 60 (H) 12/28/2019   ESRSEDRATE 102 (H) 12/22/2019   ESRSEDRATE 129 (H) 06/18/2019   CRP 1.1 (H) 12/28/2019   CRP 3.9 (H) 12/22/2019   CRP 21.8 (H) 06/18/2019   REPTSTATUS 03/04/2020 FINAL 03/01/2020   GRAMSTAIN NO WBC SEEN NO ORGANISMS SEEN  03/01/2020   CULT  03/01/2020    RARE CORYNEBACTERIUM STRIATUM Standardized susceptibility testing for this organism is not available. Performed at [facility], [address], [place], [place] [address]    LABORGA PSEUDOMONAS AERUGINOSA 12/22/2019   LABORGA PROTEUS MIRABILIS 12/22/2019     Lab Results    Component Value Date   ALBUMIN 2.9 (L) 12/23/2019   ALBUMIN 3.0 (L) 12/21/2019   ALBUMIN 2.7 (L) 11/09/2019   PREALBUMIN 12.5 (L) 12/22/2019    Lab Results  Component Value Date   MG 1.9 06/19/2019   No results found for: [facility]  Lab Results  Component Value Date   PREALBUMIN 12.5 (L) 12/22/2019   CBC EXTENDED Latest Ref Rng & Units 02/19/2020 12/29/2019 12/28/2019  WBC 4.0 - 10.5 K/uL 4.8 3.8(L) 4.0  RBC 3.87 - 5.11 MIL/uL 3.93 3.49(L) 3.46(L)  HGB 12.0 - 15.0 g/dL 3.6(U) 4.4(I) 3.4(V)  HCT 36 - 46 % 34.3(L) 29.8(L) 30.0(L)  PLT 150 - 400 K/uL 295 PLATELET CLUMPS NOTED ON SMEAR, UNABLE TO ESTIMATE 225  NEUTROABS 1.7 - 7.7 K/uL - - -  LYMPHSABS 0.7 - 4.0 K/uL - - -     There is no height or weight on file to calculate BMI.  Orders:  No orders of the defined types were placed in this encounter.  No orders of the defined types were placed in this encounter.    Procedures: No procedures performed  Clinical Data: No additional findings.  ROS:  All other systems negative, except as noted in the HPI. Review of Systems  Objective: Vital Signs: There were no vitals taken for this visit.  Specialty Comments:  No specialty comments available.  PMFS History: Patient Active Problem List   Diagnosis Date Noted  . Acute hematogenous osteomyelitis of right foot (HCC) 02/19/2020  . Pseudomonas aeruginosa infection 12/24/2019  . Controlled type 2 diabetes mellitus with circulatory disorder, without long-term current use of insulin (HCC) 12/22/2019  . Osteomyelitis of right fibula (HCC) 12/22/2019  . Severe protein-calorie malnutrition (HCC)   . Venous stasis ulcers of both lower extremities (HCC)   . Bilateral cellulitis of lower leg 08/12/2019  . 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 Pressure injury of skin 06/19/2019  . Elevated C-reactive protein (CRP)   . Leg ulcer (HCC)   . Adjustment disorder with mixed disturbance of emotions and conduct   .  Type 2 diabetes mellitus with hyperglycemia (HCC) 05/04/2019  . Infected ulcer of skin (HCC)   . High risk social situation   . Post-polio syndrome   . Chronic pain syndrome   . Limited mobility 04/20/2019  . Venous stasis ulcer (HCC) 03/31/2019  . Overgrown toenails 10/28/2018  . Wound of right leg 02/06/2018  . Atrial fibrillation (HCC) 01/03/2018  . Anemia 01/03/2018  . Elevated serum creatinine 01/03/2018  . Bipolar I disorder with mania (HCC) 01/03/2018   Past Medical History:  Diagnosis Date  . Arthritis   . Hypoglycemia   . Pneumonia, pneumococcal (HCC)     Family History  Family history Caroline: Yes    Past Surgical History:  Procedure Laterality Date  . ABDOMINAL HYSTERECTOMY    . AMPUTATION Right 02/19/2020   Procedure: RIGHT BELOW KNEE AMPUTATION;  Surgeon: Nadara Mustard, MD;  Location: [facility] OR;  Service: Orthopedics;  Laterality: Right;  . CESAREAN SECTION     Social History   Occupational History  . Not on file  Tobacco Use  . Smoking status: Light Tobacco Smoker    Packs/day: 0.25  . Smokeless tobacco: Never Used  Vaping Use  . Vaping Use: Never used  Substance and Sexual Activity  . Alcohol use: No  . Drug use: No  . Sexual activity: Not on file

## 2020-09-24 ENCOUNTER — Emergency Department (HOSPITAL_COMMUNITY): Payer: Medicare Other

## 2020-09-24 ENCOUNTER — Other Ambulatory Visit: Payer: Self-pay

## 2020-09-24 ENCOUNTER — Emergency Department (HOSPITAL_COMMUNITY)
Admission: EM | Admit: 2020-09-24 | Discharge: 2020-09-24 | Disposition: A | Discharge status: Home / Self Care | Payer: Medicare Other | Attending: Emergency Medicine | Admitting: Emergency Medicine

## 2020-09-24 ENCOUNTER — Encounter (HOSPITAL_COMMUNITY): Payer: Self-pay | Admitting: Emergency Medicine

## 2020-09-24 DIAGNOSIS — E119 Type 2 diabetes mellitus without complications: Secondary | ICD-10-CM | POA: Insufficient documentation

## 2020-09-24 DIAGNOSIS — S0990XA Unspecified injury of head, initial encounter: Secondary | ICD-10-CM | POA: Diagnosis present

## 2020-09-24 DIAGNOSIS — I872 Venous insufficiency (chronic) (peripheral): Secondary | ICD-10-CM | POA: Insufficient documentation

## 2020-09-24 DIAGNOSIS — F17210 Nicotine dependence, cigarettes, uncomplicated: Secondary | ICD-10-CM | POA: Insufficient documentation

## 2020-09-24 DIAGNOSIS — M86061 Acute hematogenous osteomyelitis, right tibia and fibula: Secondary | ICD-10-CM | POA: Insufficient documentation

## 2020-09-24 DIAGNOSIS — E43 Unspecified severe protein-calorie malnutrition: Secondary | ICD-10-CM | POA: Insufficient documentation

## 2020-09-24 DIAGNOSIS — M86071 Acute hematogenous osteomyelitis, right ankle and foot: Secondary | ICD-10-CM | POA: Insufficient documentation

## 2020-09-24 DIAGNOSIS — L97929 Non-pressure chronic ulcer of unspecified part of left lower leg with unspecified severity: Secondary | ICD-10-CM | POA: Insufficient documentation

## 2020-09-24 DIAGNOSIS — Y92129 Unspecified place in nursing home as the place of occurrence of the external cause: Secondary | ICD-10-CM | POA: Diagnosis not present

## 2020-09-24 DIAGNOSIS — W06XXXA Fall from bed, initial encounter: Secondary | ICD-10-CM | POA: Insufficient documentation

## 2020-09-24 DIAGNOSIS — W19XXXA Unspecified fall, initial encounter: Secondary | ICD-10-CM

## 2020-09-24 DIAGNOSIS — I4891 Unspecified atrial fibrillation: Secondary | ICD-10-CM | POA: Insufficient documentation

## 2020-09-24 DIAGNOSIS — Z79899 Other long term (current) drug therapy: Secondary | ICD-10-CM | POA: Insufficient documentation

## 2020-09-24 DIAGNOSIS — F339 Major depressive disorder, recurrent, unspecified: Secondary | ICD-10-CM | POA: Diagnosis not present

## 2020-09-24 DIAGNOSIS — L97919 Non-pressure chronic ulcer of unspecified part of right lower leg with unspecified severity: Secondary | ICD-10-CM | POA: Insufficient documentation

## 2020-09-24 NOTE — Discharge Instructions (Signed)
Please practice safety and fall precautions.  You may have a slight headache over the next few days, notify staff and return the emergency department if you have any episodes of losing consciousness, severe pain when looking around, seizure activity.

## 2020-09-24 NOTE — ED Notes (Signed)
Dispo instructions sent to [name] with [facility]

## 2020-09-24 NOTE — ED Provider Notes (Signed)
[facility] EMERGENCY DEPARTMENT Provider Note   CSN: [id number] Arrival date & time: 09/24/20  1310     History Chief Complaint  Patient presents with  . Fall    PAYSEN GOZA is a 70 y.o. female.  The history is provided by the patient.  Fall This is a new problem. The current episode started less than 1 hour ago. The problem occurs constantly. The problem has been resolved. Pertinent negatives include no chest pain, no abdominal pain and no shortness of breath. Nothing aggravates the symptoms. Nothing relieves the symptoms. She has tried nothing for the symptoms.       Past Medical History:  Diagnosis Date  . Arthritis   . Hypoglycemia   . Pneumonia, pneumococcal [facility])     Patient Active Problem List   Diagnosis Date Noted  . Acute hematogenous osteomyelitis of right foot (HCC) 02/19/2020  . Pseudomonas aeruginosa infection 12/24/2019  . Controlled type 2 diabetes mellitus with circulatory disorder, without long-term current use of insulin (HCC) 12/22/2019  . Osteomyelitis of right fibula (HCC) 12/22/2019  . Severe protein-calorie malnutrition (HCC)   . Venous stasis ulcers of both lower extremities (HCC)   . Bilateral cellulitis of lower leg 08/12/2019  . 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 Pressure injury of skin 06/19/2019  . Elevated C-reactive protein (CRP)   . Leg ulcer (HCC)   . Adjustment disorder with mixed disturbance of emotions and conduct   . Type 2 diabetes mellitus with hyperglycemia (HCC) 05/04/2019  . Infected ulcer of skin (HCC)   . High risk social situation   . Post-polio syndrome   . Chronic pain syndrome   . Limited mobility 04/20/2019  . Venous stasis ulcer (HCC) 03/31/2019  . Overgrown toenails 10/28/2018  . Wound of right leg 02/06/2018  . Atrial fibrillation (HCC) 01/03/2018  . Anemia 01/03/2018  . Elevated serum creatinine 01/03/2018  . Bipolar I disorder with mania (HCC) 01/03/2018    Past  Surgical History:  Procedure Laterality Date  . ABDOMINAL HYSTERECTOMY    . AMPUTATION Right 02/19/2020   Procedure: RIGHT BELOW KNEE AMPUTATION;  Surgeon: Nadara Mustard, MD;  Location: [facility] OR;  Service: Orthopedics;  Laterality: Right;  . CESAREAN SECTION       OB History    Gravida      Para      Term      Preterm      AB      Living  3     SAB      IAB      Ectopic      Multiple      Live Births              Family History  Family history unknown: Yes    Social History   Tobacco Use  . Smoking status: Light Tobacco Smoker    Packs/day: 0.25  . Smokeless tobacco: Never Used  Vaping Use  . Vaping Use: Never used  Substance Use Topics  . Alcohol use: No  . Drug use: No    Home Medications Prior to Admission medications   Medication Sig Start Date End Date Taking? Authorizing Provider  doxycycline (VIBRA-TABS) 100 MG tablet Take 1 tablet (100 mg total) by mouth 2 (two) times daily. 03/10/20   Nadara Mustard, MD  doxycycline (VIBRAMYCIN) 100 MG capsule Take 1 capsule (100 mg total) by mouth 2 (two) times daily. 03/01/20   Jacalyn Lefevre, MD  DULoxetine (CYMBALTA) 60 MG capsule Take 60 mg by mouth daily.    [provider]  gabapentin (NEURONTIN) 300 MG capsule Take 2 capsules (600 mg total) by mouth 3 (three) times daily. 06/29/19   Myrene Buddy, MD  metoprolol tartrate (LOPRESSOR) 25 MG tablet Take 0.5 tablets (12.5 mg total) by mouth 2 (two) times daily. 12/29/19   Mujtaba, Mohammadtokir, MD  Multiple Vitamins-Minerals (CERTAVITE/ANTIOXIDANTS PO) Take 1 tablet by mouth daily.    [provider]  naproxen sodium (ALEVE) 220 MG tablet Take 220 mg by mouth daily as needed (pain).    [provider]  nutrition supplement, JUVEN, (JUVEN) PACK Take 1 packet by mouth 2 (two) times daily between meals. 12/29/19   Mujtaba, Mohammadtokir, MD  oxyCODONE (OXY IR/ROXICODONE) 5 MG immediate release tablet Take 1-2 tablets (5-10 mg total) by  mouth every 4 (four) hours as needed for moderate pain (pain score 4-6). 02/23/20   Persons, [place], PA  furosemide (LASIX) 20 MG tablet Take 0.5 tablets (10 mg total) by mouth daily. Patient not taking: Reported on 04/18/2018 12/10/17 05/04/19  Arthor Captain, PA-C    Allergies    Codeine and Tylenol [acetaminophen]  Review of Systems   Review of Systems  Constitutional: Negative for chills and fever.  HENT: Negative for ear pain and sore throat.   Eyes: Negative for pain and visual disturbance.  Respiratory: Negative for cough and shortness of breath.   Cardiovascular: Negative for chest pain and palpitations.  Gastrointestinal: Negative for abdominal pain and vomiting.  Genitourinary: Negative for dysuria and hematuria.  Musculoskeletal: Negative for arthralgias and back pain.       Fall  Skin: Negative for color change and rash.  Neurological: Negative for seizures and syncope.  All other systems reviewed and are negative.   Physical Exam Updated Vital Signs BP 131/60 (BP Location: Right Arm)   Pulse 61   Temp (!) 97.5 F (36.4 C)   Resp 16   SpO2 99%   Physical Exam Vitals and nursing note reviewed.  Constitutional:      General: She is not in acute distress.    Appearance: She is well-developed and well-nourished.  HENT:     Head: Normocephalic and atraumatic.  Eyes:     Conjunctiva/sclera: Conjunctivae normal.  Cardiovascular:     Rate and Rhythm: Normal rate and regular rhythm.     Heart sounds: No murmur heard.   Pulmonary:     Effort: Pulmonary effort is normal. No respiratory distress.     Breath sounds: Normal breath sounds.  Abdominal:     Palpations: Abdomen is soft.     Tenderness: There is no abdominal tenderness.  Musculoskeletal:        General: No edema.     Cervical back: Neck supple.     Comments: S/p R BKA-surgically absent  Skin:    General: Skin is warm and dry.  Neurological:     Mental Status: She is alert.  Psychiatric:         Mood and Affect: Mood and affect normal.     ED Results / Procedures / Treatments   Labs (all labs ordered are listed, but only abnormal results are displayed) Labs Reviewed - No data to display  EKG None  Radiology CT Head Wo Contrast  Result Date: 09/24/2020 CLINICAL DATA:  Fall from bed, striking forehead. Hematoma above the right eye. Right shoulder pain. EXAM: CT HEAD WITHOUT CONTRAST TECHNIQUE: Contiguous axial images were obtained from the  base of the skull through the vertex without intravenous contrast. COMPARISON:  None. FINDINGS: Brain: The brainstem, cerebellum, cerebral peduncles, thalami, basal ganglia, basilar cisterns, and ventricular system appear within normal limits. Periventricular white matter and corona radiata hypodensities favor chronic ischemic microvascular white matter disease. No intracranial hemorrhage, mass lesion, or acute CVA. Vascular: There is atherosclerotic calcification of the cavernous carotid arteries bilaterally. Skull: Unremarkable Sinuses/Orbits: Chronic bilateral ethmoid, maxillary, frontal, and sphenoid sinusitis. Left nasal polyps. Other: Right supraorbital scalp hematoma. IMPRESSION: 1. No acute intracranial findings. 2. Right supraorbital scalp hematoma. 3. Periventricular white matter and corona radiata hypodensities favor chronic ischemic microvascular white matter disease. 4. Chronic paranasal sinusitis. Left nasal polyps. Electronically Signed   By: Gaylyn Rong M.D.   On: 09/24/2020 17:59   CT Maxillofacial Wo Contrast  Result Date: 09/24/2020 CLINICAL DATA:  Fall from bed, facial trauma with right supraorbital scalp hematoma. EXAM: CT MAXILLOFACIAL WITHOUT CONTRAST TECHNIQUE: Multidetector CT imaging of the maxillofacial structures was performed. Multiplanar CT image reconstructions were also generated. COMPARISON:  CT head 09/24/2020 FINDINGS: Osseous: The patient is edentulous. 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 No facial fracture is identified. Orbits: Unremarkable Sinuses: Chronic paranasal pansinusitis.  Left nasal cavity polyps. Soft tissues: Right supraorbital scalp hematoma. Limited intracranial: Unremarkable IMPRESSION: 1. No facial fracture is identified. 2. Right supraorbital scalp hematoma. 3. Chronic paranasal pansinusitis. 4. 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 5. Left nasal cavity polyps. Electronically Signed   By: Gaylyn Rong M.D.   On: 09/24/2020 18:10    Procedures Procedures   Medications Ordered in ED Medications - No data to display  ED Course  I have reviewed the triage vital signs and the nursing notes.  Pertinent labs & imaging results that were available during my care of the patient were reviewed by me and considered in my medical decision making (see chart for details).    MDM Rules/Calculators/A&P                         This is a 70 year old female with a past medical history of atrial fibrillation (denies being on anticoagulation I do not see any anticoagulation in chart), depression, osteomyelitis status post right BKA, bipolar disorder, anemia, who presents emergency department for evaluation of a fall out of bed.  Patient stays at skilled nursing facility who reports the patient mechanical fall when she rolled out of bed and struck her head, no loss of consciousness.  Patient reports that she may have "blacked out", and then fell out of bed when she hit her head.  She arrives she is alert and oriented to self, place, but is unable to tell me the year.  She denies any significant tenderness on secondary exam with palpation of the upper extremities, chest wall, abdomen, cervical, thoracic, and lumbar spine, pelvis, and lower extremity.  Patient arrived with cervical collar in place, while maintaining the head in normal anatomical alignment, the midline cervical spine was palpated without any  reported tenderness, she is neurovascularly intact to the upper extremities with normal sensation, strength bilaterally.  She is able to range her head slowly in all directions without any reported paresthesias or pain, cervical spine cleared.  She does have some evidence of right periorbital ecchymosis, she has full extraocular motions that are nonpainful, she does not have any proptosis, her pupils are equal round reactive to light, she does have  some left maxillary and zygomatic complex tenderness, we will obtain a CT of the head and facial bones to rule out fracture.  Patient appears to be at baseline, is mentating appropriately and answering questions, she does not have any headache, lightheadedness, dizziness.  She has no history of seizures and has no evidence of seizure today with no tongue biting, urinary incontinence.  CT of the head and facial bones shows no acute fractures or other intracranial abnormality. Her ECG shows low voltage p waves, but does appear to be an atypical atrial flutter with atrial rate around 170 and variable conduction, ventricular rate is around 60. She has a history of atrial flutter with 4:1 conduction on previous ECG's which is consistent with todays. There are no ischemic changes. Patient is asymptomatic, at her baseline and appropriate for discharge back to [facility].   Final Clinical Impression(s) / ED Diagnoses Final diagnoses:  Fall, initial encounter  Minor head injury, initial encounter    Rx / DC Orders ED Discharge Orders    None       Kathleen Lime, MD 09/25/20 [id number]    Gerhard Munch, MD 09/25/20 1734

## 2020-09-24 NOTE — ED Triage Notes (Addendum)
Pt to triage via [facility] from [facility].  States she rolled out of bed and hit forehead just PTA.  Hematoma to forehead and above R eye.  Denies LOC.  C/o R shoulder pain.  C-collar in place.  Pt alert and oriented x 3.  Disoriented to time baseline.  No blood thinners.  CBG 107.

## 2020-10-24 IMAGING — DX DG TIBIA/FIBULA PORT 2V*R*
2 series · 2 of 2 positions shown · non-contrast
Comparison: 05/04/2019

CLINICAL DATA: Leg ulcer

EXAM:
PORTABLE RIGHT TIBIA AND FIBULA - 2 VIEW

[tibia ap]
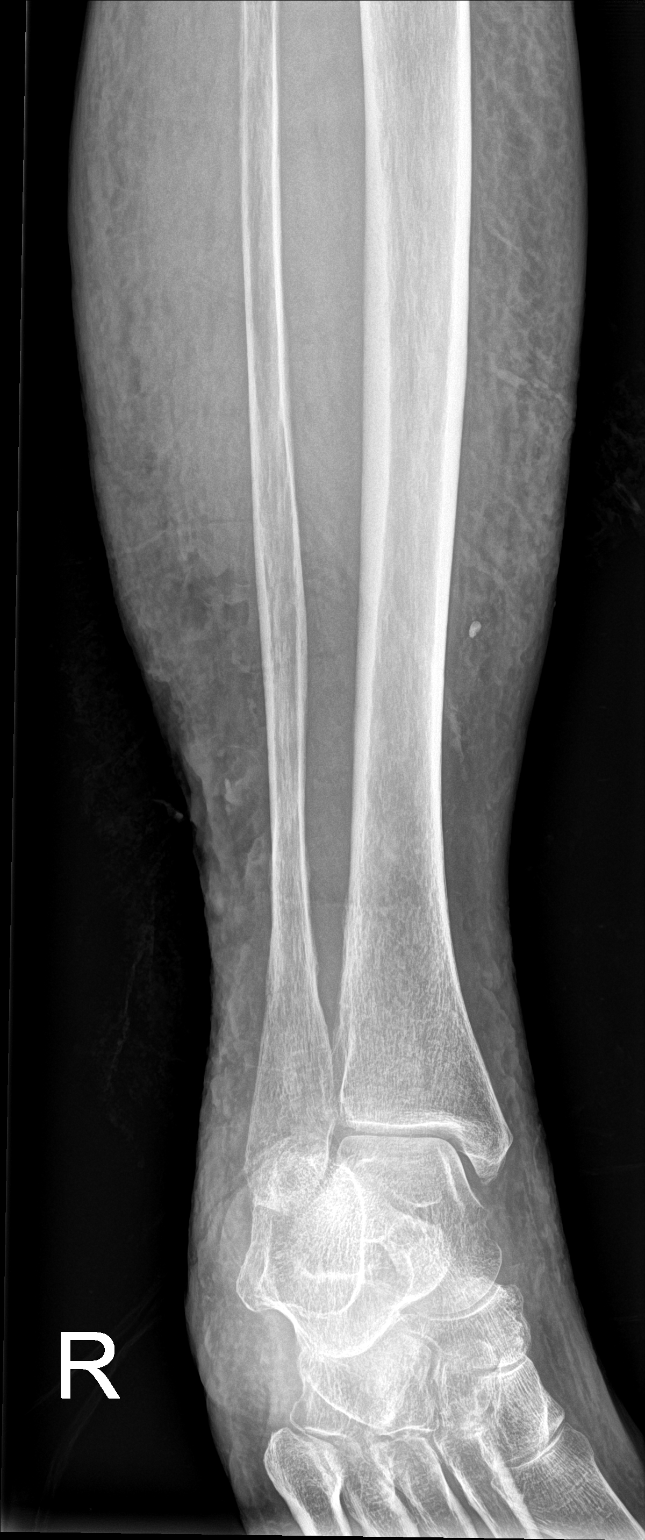

[tibia lat]
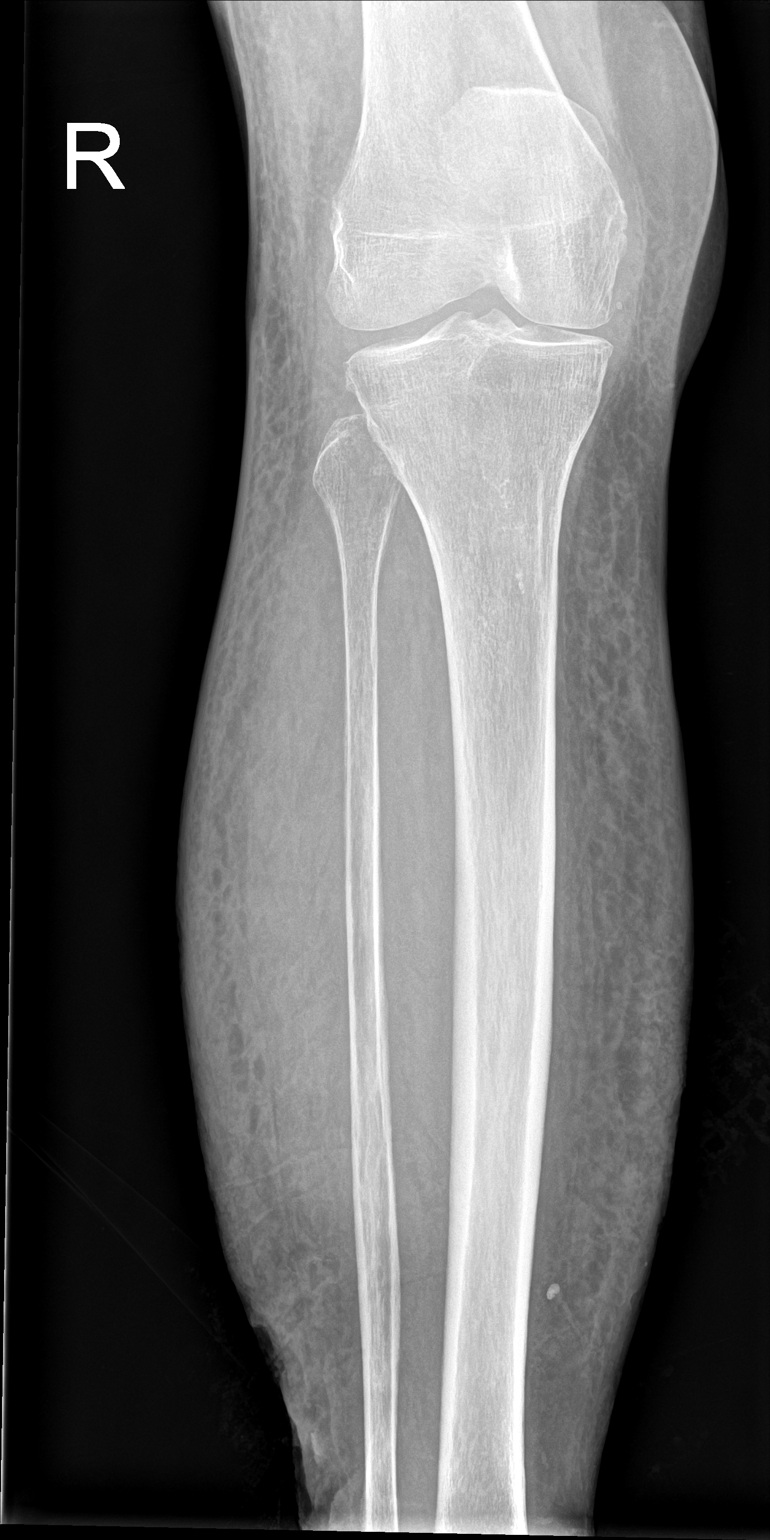

[2 of 2 positions shown; findings below may reference images not displayed]

FINDINGS: Separate AP views of the proximal and distal RIGHT lower leg.

obtained.

Osseous demineralization.

Knee and ankle joint spaces preserved.

Diffuse soft tissue swelling of the RIGHT lower leg with large area
of soft tissue irregularity and ulceration at the lateral aspect of
the distal RIGHT lower leg.

No definite fracture, dislocation or bone destruction identified on
limited assessment.
IMPRESSION: Soft tissue swelling without definite acute bony abnormalities on
with assessment.

Soft tissue ulcer at lateral aspect of distal RIGHT lower leg.

## 2020-10-24 IMAGING — DX DG CHEST 1V PORT
1 series · 1 of 1 positions shown · non-contrast
Comparison: None.

CLINICAL DATA: Patient to ED via EMS, reports worsening pain at
buttocks and vagina for several days, denies injury or fall, EMS
witnessed poor living condition in her apartment with roaches, bed
bugs and maggots at buttocks. Patient added deep.*comment was
truncated*shortness of breath

EXAM:
PORTABLE CHEST 1 VIEW

[chest ap]
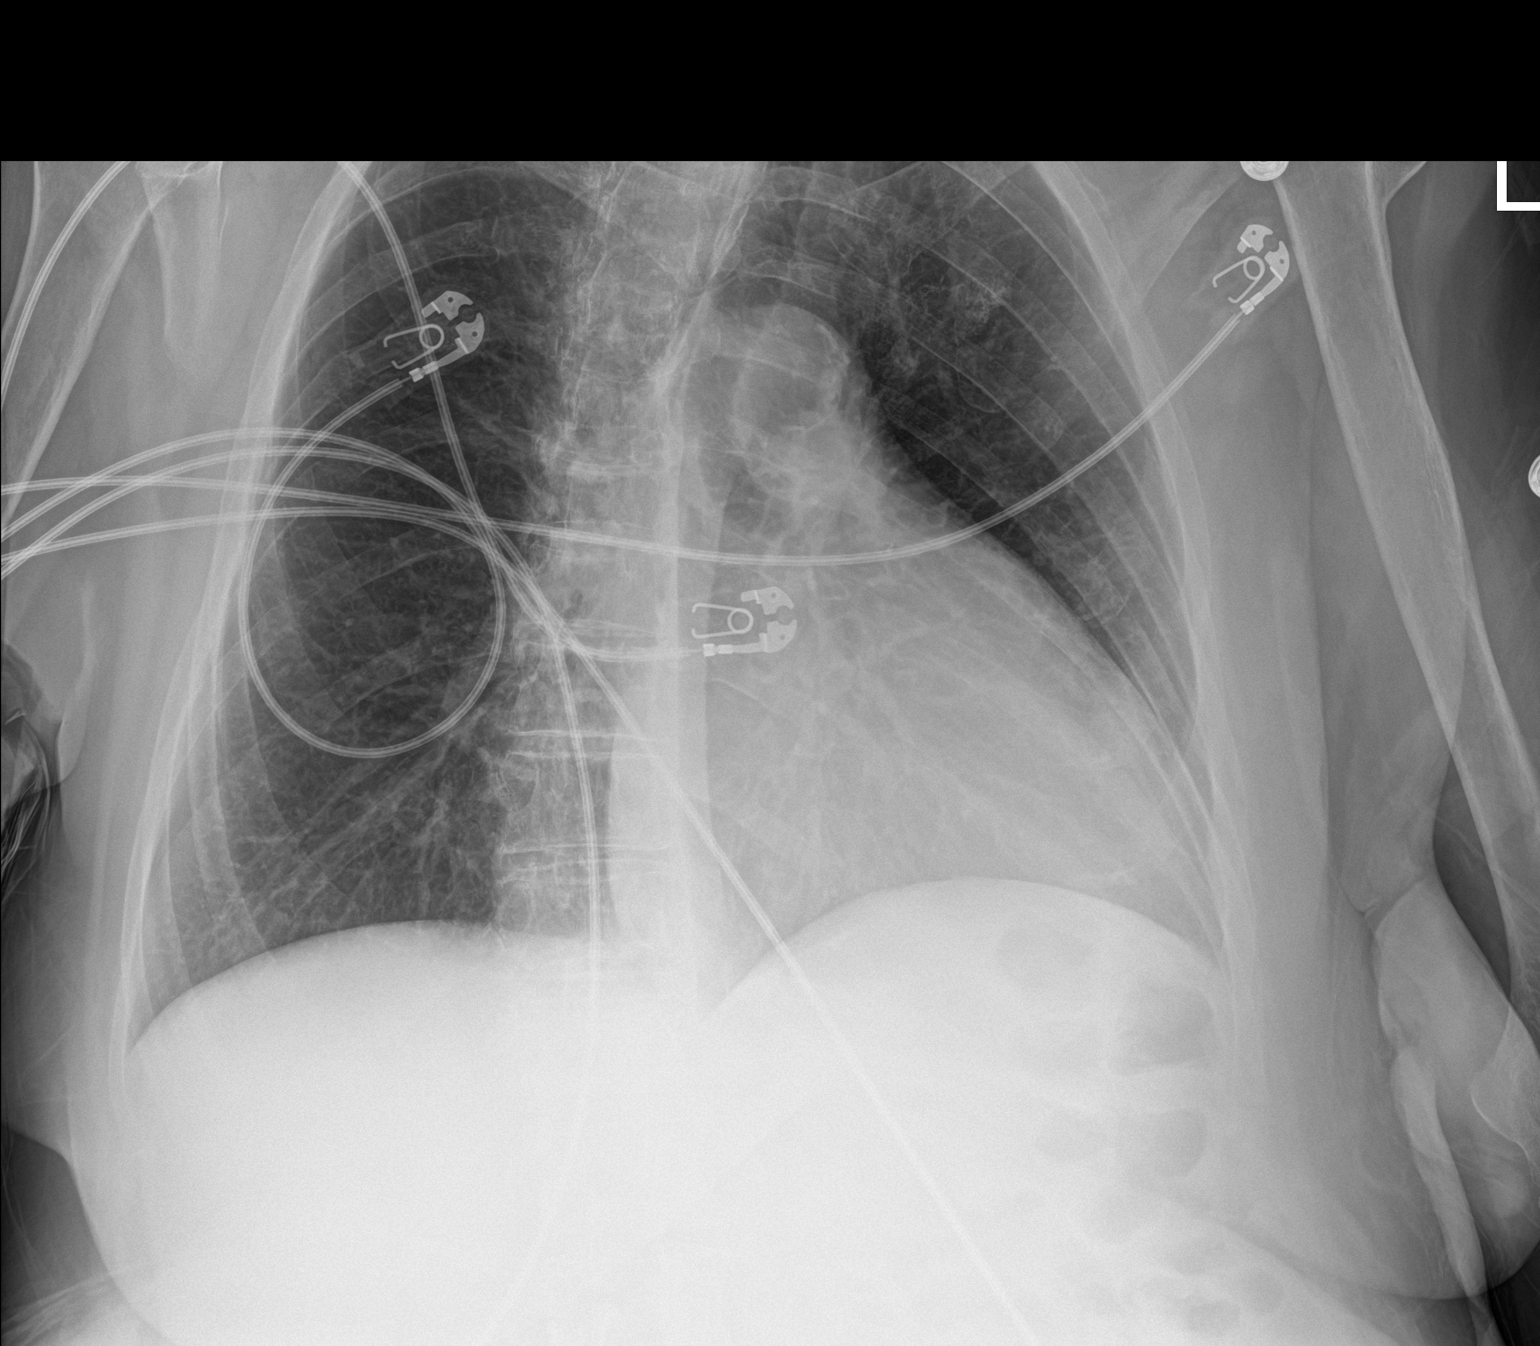

[1 of 1 positions shown; findings below may reference images not displayed]

FINDINGS: Normal mediastinum and cardiac silhouette. Normal pulmonary
vasculature. No evidence of effusion, infiltrate, or pneumothorax.
No acute bony abnormality. Remote LEFT humeral fracture
IMPRESSION: No acute cardiopulmonary process.

## 2020-10-26 IMAGING — DX DG TIBIA/FIBULA 2V*L*
3 series · 3 of 3 positions shown · non-contrast
Comparison: 05/04/2019

CLINICAL DATA: Worsening erythema, suspected cellulitis

EXAM:
LEFT TIBIA AND FIBULA - 2 VIEW

[tibia ap]
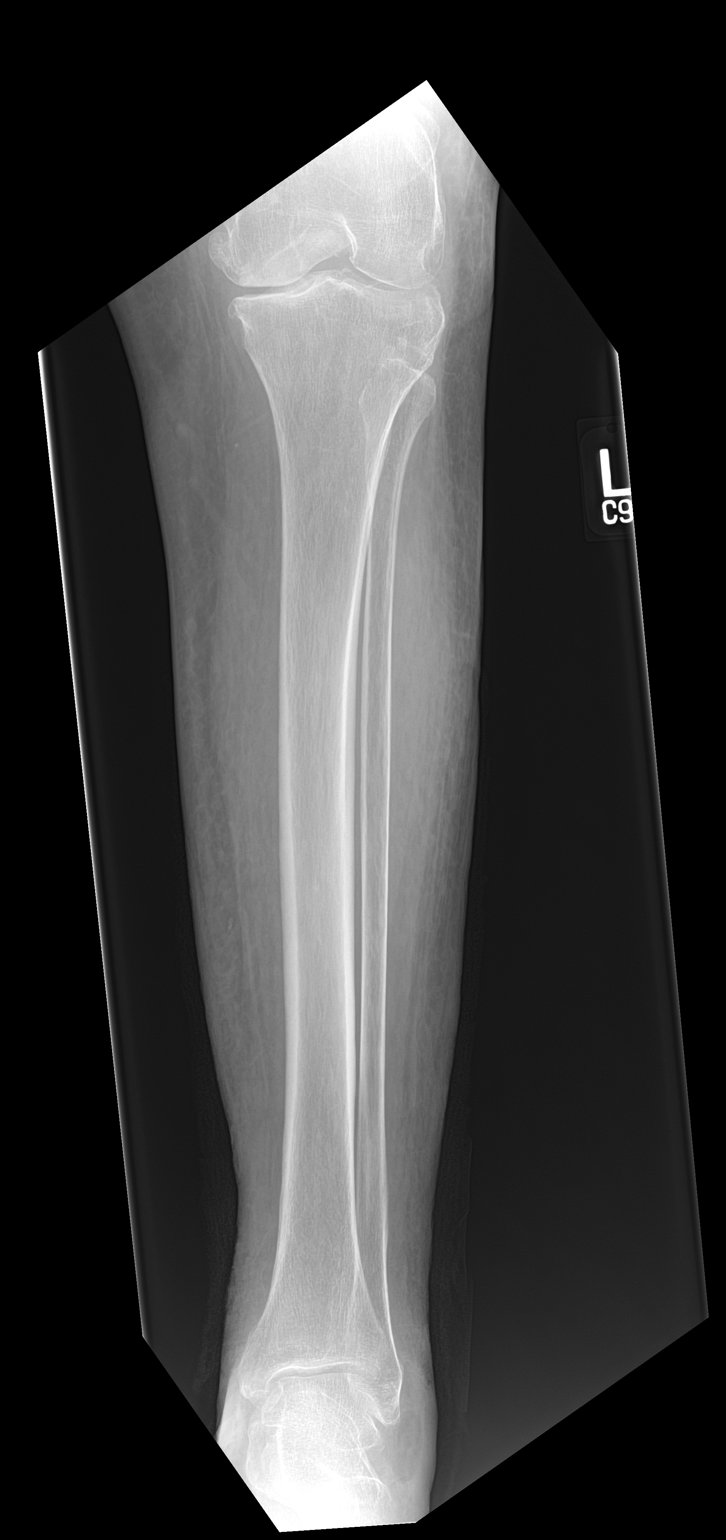

[tibia lat (1 of 2)]
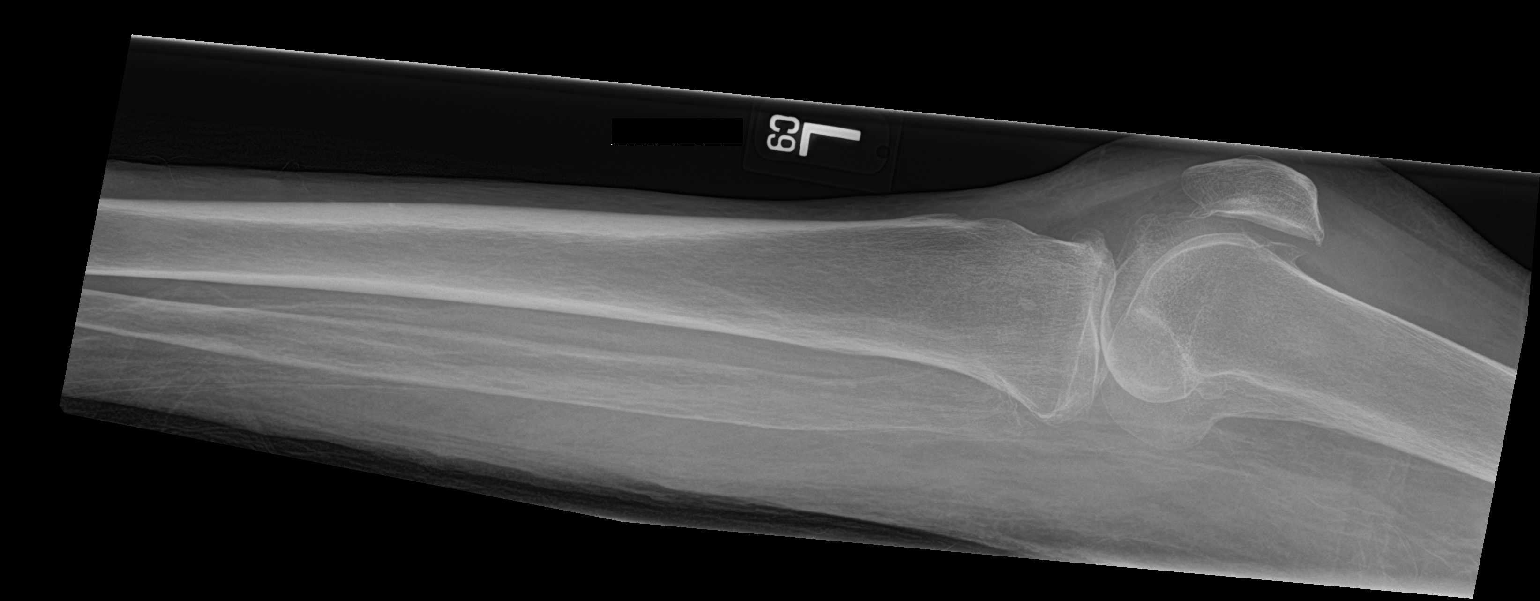

[tibia lat (2 of 2)]
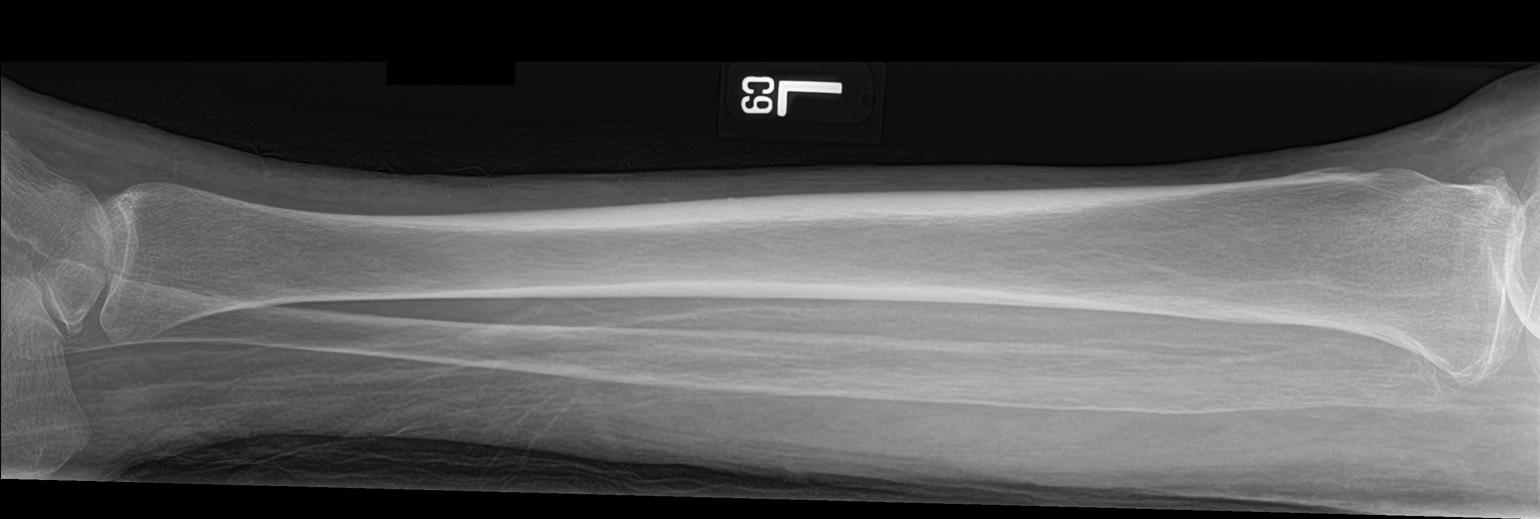

[3 of 3 positions shown; findings below may reference images not displayed]

FINDINGS: No fracture or dislocation of the left tibia or fibula. Knee joint
arthrosis. Soft tissue edema is decreased in comparison to
examination dated 05/04/2019.
IMPRESSION: No fracture or dislocation of the left tibia or fibula. Soft tissue
edema is decreased in comparison to examination dated 05/04/2019.

## 2022-01-18 ENCOUNTER — Ambulatory Visit: Payer: Medicare Other | Admitting: Orthopedic Surgery

## 2022-01-30 ENCOUNTER — Ambulatory Visit (INDEPENDENT_AMBULATORY_CARE_PROVIDER_SITE_OTHER): Payer: Medicare Other | Admitting: Orthopedic Surgery

## 2022-01-30 ENCOUNTER — Ambulatory Visit (INDEPENDENT_AMBULATORY_CARE_PROVIDER_SITE_OTHER): Payer: Medicare Other

## 2022-01-30 DIAGNOSIS — M25561 Pain in right knee: Secondary | ICD-10-CM | POA: Diagnosis not present

## 2022-01-30 DIAGNOSIS — S72451A Displaced supracondylar fracture without intracondylar extension of lower end of right femur, initial encounter for closed fracture: Secondary | ICD-10-CM | POA: Diagnosis not present

## 2022-02-13 ENCOUNTER — Encounter: Payer: Self-pay | Admitting: Orthopedic Surgery

## 2022-02-13 NOTE — Progress Notes (Signed)
Office Visit Note   Patient: [name]           Date of Birth: November 19, 1950           MRN: [id number] Visit Date: 01/30/2022              Requested by: Maury Dus, MD [address] [place],  [place] [address] PCP: Maury Dus, MD  Chief Complaint  Patient presents with   Right Leg - Injury    Hx right BKA      HPI: Patient is a 71 year old woman who is seen for initial evaluation for supracondylar right femur fracture with a shortened right below-knee amputation.  Patient is unsure if she fell or was dropped.  Patient currently is full assist with a Hoyer lift for transfers she is nonambulatory.  Assessment & Plan: Visit Diagnoses:  1. Acute pain of right knee   2. Displaced supracondylar fracture without intracondylar extension of lower end of right femur, initial encounter for closed fracture [facility])     Plan: The skin is intact patient is nonambulatory no surgical intervention necessary at this time no immobilization necessary  Follow-Up Instructions: Return if symptoms worsen or fail to improve.   Ortho Exam  Patient is alert, oriented, no adenopathy, well-dressed, normal affect, normal respiratory effort. Examination patient has an impacted supracondylar femur fracture on the right with a short transtibial amputation.  The skin is intact no ecchymosis bruising no blisters.  Patient is full assist with a Hoyer lift she does not wear her prosthesis.  Imaging: No results found. No images are attached to the encounter.  Labs: Lab Results  Component Value Date   HGBA1C 6.1 (H) 12/22/2019   HGBA1C 6.6 (H) 05/04/2019   ESRSEDRATE 60 (H) 12/28/2019   ESRSEDRATE 102 (H) 12/22/2019   ESRSEDRATE 129 (H) 06/18/2019   CRP 1.1 (H) 12/28/2019   CRP 3.9 (H) 12/22/2019   CRP 21.8 (H) 06/18/2019   REPTSTATUS 03/04/2020 FINAL 03/01/2020   GRAMSTAIN NO WBC SEEN NO ORGANISMS SEEN  03/01/2020   CULT  03/01/2020    RARE CORYNEBACTERIUM STRIATUM Standardized  susceptibility testing for this organism is not available. Performed at [facility], [address] [place], [place] [address]    LABORGA PSEUDOMONAS AERUGINOSA 12/22/2019   LABORGA PROTEUS MIRABILIS 12/22/2019     Lab Results  Component Value Date   ALBUMIN 2.9 (L) 12/23/2019   ALBUMIN 3.0 (L) 12/21/2019   ALBUMIN 2.7 (L) 11/09/2019   PREALBUMIN 12.5 (L) 12/22/2019    Lab Results  Component Value Date   MG 1.9 06/19/2019   No results found for: "VD25OH"  Lab Results  Component Value Date   PREALBUMIN 12.5 (L) 12/22/2019      Latest Ref Rng & Units 02/19/2020    1:17 PM 12/29/2019    5:00 AM 12/28/2019    6:01 AM  CBC EXTENDED  WBC 4.0 - 10.5 K/uL 4.8  3.8  4.0   RBC 3.87 - 5.11 MIL/uL 3.93  3.49  3.46   Hemoglobin 12.0 - 15.0 g/dL 9.9  8.5  8.6   HCT 38.2 - 46.0 % 34.3  29.8  30.0   Platelets 150 - 400 K/uL 295  PLATELET CLUMPS NOTED ON SMEAR, UNABLE TO ESTIMATE  225      There is no height or weight on file to calculate BMI.  Orders:  Orders Placed This Encounter  Procedures   XR Knee 1-2 Views Right   No orders of  the defined types were placed in this encounter.    Procedures: No procedures performed  Clinical Data: No additional findings.  ROS:  All other systems negative, except as noted in the HPI. Review of Systems  Objective: Vital Signs: There were no vitals taken for this visit.  Specialty Comments:  No specialty comments available.  PMFS History: Patient Active Problem List   Diagnosis Date Noted   Acute hematogenous osteomyelitis of right foot (HCC) 02/19/2020   Pseudomonas aeruginosa infection 12/24/2019   Controlled type 2 diabetes mellitus with circulatory disorder, without long-term current use of insulin (HCC) 12/22/2019   Osteomyelitis of right fibula (HCC) 12/22/2019   Severe protein-calorie malnutrition (HCC)    Venous stasis ulcers of both lower extremities (HCC)    Bilateral cellulitis of lower leg 08/12/2019    Depression, major, recurrent, mild (HCC) 06/21/2019   Pressure injury of skin 06/19/2019   Elevated C-reactive protein (CRP)    Leg ulcer (HCC)    Adjustment disorder with mixed disturbance of emotions and conduct    Type 2 diabetes mellitus with hyperglycemia (HCC) 05/04/2019   Infected ulcer of skin (HCC)    High risk social situation    Post-polio syndrome    Chronic pain syndrome    Limited mobility 04/20/2019   Venous stasis ulcer (HCC) 03/31/2019   Overgrown toenails 10/28/2018   Wound of right leg 02/06/2018   Atrial fibrillation (HCC) 01/03/2018   Anemia 01/03/2018   Elevated serum creatinine 01/03/2018   Bipolar I disorder with mania (HCC) 01/03/2018   Past Medical History:  Diagnosis Date   Arthritis    Hypoglycemia    Pneumonia, pneumococcal (HCC)     Family History  Family history unknown: Yes    Past Surgical History:  Procedure Laterality Date   ABDOMINAL HYSTERECTOMY     AMPUTATION Right 02/19/2020   Procedure: RIGHT BELOW KNEE AMPUTATION;  Surgeon: Nadara Mustard, MD;  Location: [facility] OR;  Service: Orthopedics;  Laterality: Right;   CESAREAN SECTION     Social History   Occupational History   Not on file  Tobacco Use   Smoking status: Light Smoker    Packs/day: 0.25    Types: Cigarettes   Smokeless tobacco: Never  Vaping Use   Vaping Use: Never used  Substance and Sexual Activity   Alcohol use: No   Drug use: No   Sexual activity: Not on file

## 2022-06-11 ENCOUNTER — Other Ambulatory Visit: Payer: Self-pay

## 2022-06-11 ENCOUNTER — Emergency Department (HOSPITAL_COMMUNITY): Payer: Medicaid Other

## 2022-06-11 ENCOUNTER — Inpatient Hospital Stay (HOSPITAL_COMMUNITY): Payer: Medicaid Other

## 2022-06-11 ENCOUNTER — Encounter (HOSPITAL_COMMUNITY): Payer: Self-pay | Admitting: Emergency Medicine

## 2022-06-11 ENCOUNTER — Inpatient Hospital Stay (HOSPITAL_COMMUNITY)
Admission: EM | Admit: 2022-06-11 | Discharge: 2022-06-14 | DRG: 480 | Disposition: A | Discharge status: Home / Self Care | Payer: Medicaid Other | Source: Skilled Nursing Facility | Attending: Internal Medicine | Admitting: Internal Medicine

## 2022-06-11 ENCOUNTER — Inpatient Hospital Stay (HOSPITAL_COMMUNITY): Payer: Medicaid Other | Admitting: Anesthesiology

## 2022-06-11 DIAGNOSIS — Z885 Allergy status to narcotic agent status: Secondary | ICD-10-CM

## 2022-06-11 DIAGNOSIS — F32A Depression, unspecified: Secondary | ICD-10-CM | POA: Diagnosis present

## 2022-06-11 DIAGNOSIS — W050XXA Fall from non-moving wheelchair, initial encounter: Secondary | ICD-10-CM | POA: Diagnosis present

## 2022-06-11 DIAGNOSIS — E1159 Type 2 diabetes mellitus with other circulatory complications: Secondary | ICD-10-CM | POA: Diagnosis present

## 2022-06-11 DIAGNOSIS — F17210 Nicotine dependence, cigarettes, uncomplicated: Secondary | ICD-10-CM | POA: Diagnosis present

## 2022-06-11 DIAGNOSIS — Z89511 Acquired absence of right leg below knee: Secondary | ICD-10-CM | POA: Diagnosis not present

## 2022-06-11 DIAGNOSIS — R911 Solitary pulmonary nodule: Secondary | ICD-10-CM | POA: Diagnosis present

## 2022-06-11 DIAGNOSIS — I4891 Unspecified atrial fibrillation: Secondary | ICD-10-CM | POA: Diagnosis not present

## 2022-06-11 DIAGNOSIS — J189 Pneumonia, unspecified organism: Secondary | ICD-10-CM | POA: Diagnosis present

## 2022-06-11 DIAGNOSIS — Z886 Allergy status to analgesic agent status: Secondary | ICD-10-CM

## 2022-06-11 DIAGNOSIS — I480 Paroxysmal atrial fibrillation: Secondary | ICD-10-CM | POA: Diagnosis present

## 2022-06-11 DIAGNOSIS — S72402A Unspecified fracture of lower end of left femur, initial encounter for closed fracture: Secondary | ICD-10-CM | POA: Diagnosis not present

## 2022-06-11 DIAGNOSIS — L89152 Pressure ulcer of sacral region, stage 2: Secondary | ICD-10-CM | POA: Diagnosis present

## 2022-06-11 DIAGNOSIS — Z90710 Acquired absence of both cervix and uterus: Secondary | ICD-10-CM

## 2022-06-11 DIAGNOSIS — W19XXXA Unspecified fall, initial encounter: Secondary | ICD-10-CM | POA: Diagnosis not present

## 2022-06-11 DIAGNOSIS — Z79899 Other long term (current) drug therapy: Secondary | ICD-10-CM | POA: Diagnosis not present

## 2022-06-11 DIAGNOSIS — Y92129 Unspecified place in nursing home as the place of occurrence of the external cause: Secondary | ICD-10-CM | POA: Diagnosis not present

## 2022-06-11 DIAGNOSIS — E119 Type 2 diabetes mellitus without complications: Secondary | ICD-10-CM | POA: Diagnosis not present

## 2022-06-11 DIAGNOSIS — E1140 Type 2 diabetes mellitus with diabetic neuropathy, unspecified: Secondary | ICD-10-CM | POA: Diagnosis present

## 2022-06-11 DIAGNOSIS — F17200 Nicotine dependence, unspecified, uncomplicated: Secondary | ICD-10-CM | POA: Diagnosis not present

## 2022-06-11 DIAGNOSIS — S7292XA Unspecified fracture of left femur, initial encounter for closed fracture: Secondary | ICD-10-CM | POA: Diagnosis not present

## 2022-06-11 DIAGNOSIS — I10 Essential (primary) hypertension: Secondary | ICD-10-CM | POA: Diagnosis not present

## 2022-06-11 DIAGNOSIS — Z993 Dependence on wheelchair: Secondary | ICD-10-CM

## 2022-06-11 DIAGNOSIS — S72352A Displaced comminuted fracture of shaft of left femur, initial encounter for closed fracture: Secondary | ICD-10-CM | POA: Diagnosis present

## 2022-06-11 LAB — CBC WITH DIFFERENTIAL/PLATELET
Abs Immature Granulocytes: 0.04 10*3/uL (ref 0.00–0.07)
Basophils Absolute: 0.1 10*3/uL (ref 0.0–0.1)
Basophils Relative: 1 %
Eosinophils Absolute: 0.1 10*3/uL (ref 0.0–0.5)
Eosinophils Relative: 2 %
HCT: 38.9 % (ref 36.0–46.0)
Hemoglobin: 12 g/dL (ref 12.0–15.0)
Immature Granulocytes: 1 %
Lymphocytes Relative: 14 %
Lymphs Abs: 1 10*3/uL (ref 0.7–4.0)
MCH: 28.6 pg (ref 26.0–34.0)
MCHC: 30.8 g/dL (ref 30.0–36.0)
MCV: 92.6 fL (ref 80.0–100.0)
Monocytes Absolute: 0.6 10*3/uL (ref 0.1–1.0)
Monocytes Relative: 8 %
Neutro Abs: 5.6 10*3/uL (ref 1.7–7.7)
Neutrophils Relative %: 74 %
Platelets: 237 10*3/uL (ref 150–400)
RBC: 4.2 MIL/uL (ref 3.87–5.11)
RDW: 14.9 % (ref 11.5–15.5)
WBC: 7.4 10*3/uL (ref 4.0–10.5)
nRBC: 0 % (ref 0.0–0.2)

## 2022-06-11 LAB — COMPREHENSIVE METABOLIC PANEL
ALT: 18 U/L (ref 0–44)
AST: 28 U/L (ref 15–41)
Albumin: 3.4 g/dL — ABNORMAL LOW (ref 3.5–5.0)
Alkaline Phosphatase: 96 U/L (ref 38–126)
Anion gap: 11 (ref 5–15)
BUN: 25 mg/dL — ABNORMAL HIGH (ref 8–23)
CO2: 27 mmol/L (ref 22–32)
Calcium: 9 mg/dL (ref 8.9–10.3)
Chloride: 101 mmol/L (ref 98–111)
Creatinine, Ser: 0.92 mg/dL (ref 0.44–1.00)
GFR, Estimated: >60 mL/min (ref >60)
Glucose, Bld: 138 mg/dL — ABNORMAL HIGH (ref 70–99)
Potassium: 4 mmol/L (ref 3.5–5.1)
Sodium: 139 mmol/L (ref 135–145)
Total Bilirubin: 0.3 mg/dL (ref 0.3–1.2)
Total Protein: 7.7 g/dL (ref 6.5–8.1)

## 2022-06-11 LAB — HEMOGLOBIN A1C
Hgb A1c MFr Bld: 5.6 % (ref 4.8–5.6)
Mean Plasma Glucose: 114.02 mg/dL

## 2022-06-11 LAB — CBG MONITORING, ED: Glucose-Capillary: 139 mg/dL — ABNORMAL HIGH (ref 70–99)

## 2022-06-11 MED ORDER — HYDROMORPHONE HCL 1 MG/ML IJ SOLN
0.5000 mg | INTRAMUSCULAR | Status: DC | PRN
  Administered 2022-06-11 – 2022-06-13 (×2): 1 mg via INTRAVENOUS
  Filled 2022-06-11 (×2): qty 1

## 2022-06-11 MED ORDER — GABAPENTIN 300 MG PO CAPS
600.0000 mg | ORAL_CAPSULE | Freq: Three times a day (TID) | ORAL | Status: DC
  Administered 2022-06-12 – 2022-06-14 (×8): 600 mg via ORAL
  Filled 2022-06-11 (×8): qty 2

## 2022-06-11 MED ORDER — METOPROLOL TARTRATE 12.5 MG HALF TABLET
12.5000 mg | ORAL_TABLET | Freq: Two times a day (BID) | ORAL | Status: DC
  Administered 2022-06-12 – 2022-06-14 (×5): 12.5 mg via ORAL
  Filled 2022-06-11 (×6): qty 1

## 2022-06-11 MED ORDER — IOHEXOL 350 MG/ML SOLN
75.0000 mL | Freq: Once | INTRAVENOUS | Status: AC | PRN
  Administered 2022-06-11: 75 mL via INTRAVENOUS

## 2022-06-11 MED ORDER — INSULIN ASPART 100 UNIT/ML IJ SOLN
0.0000 [IU] | INTRAMUSCULAR | Status: DC
  Administered 2022-06-11 – 2022-06-12 (×3): 1 [IU] via SUBCUTANEOUS
  Administered 2022-06-12: 2 [IU] via SUBCUTANEOUS
  Administered 2022-06-13 (×2): 1 [IU] via SUBCUTANEOUS

## 2022-06-11 MED ORDER — SODIUM CHLORIDE 0.9 % IV SOLN
500.0000 mg | INTRAVENOUS | Status: DC
  Administered 2022-06-11 – 2022-06-13 (×3): 500 mg via INTRAVENOUS
  Filled 2022-06-11 (×4): qty 5

## 2022-06-11 MED ORDER — ROPIVACAINE HCL 5 MG/ML IJ SOLN
INTRAMUSCULAR | Status: DC | PRN
  Administered 2022-06-11: 20 mL via PERINEURAL

## 2022-06-11 MED ORDER — SODIUM CHLORIDE 0.9 % IV SOLN
2.0000 g | INTRAVENOUS | Status: DC
  Administered 2022-06-11 – 2022-06-13 (×3): 2 g via INTRAVENOUS
  Filled 2022-06-11 (×3): qty 20

## 2022-06-11 MED ORDER — OLANZAPINE 5 MG PO TBDP
5.0000 mg | ORAL_TABLET | Freq: Every day | ORAL | Status: DC
  Administered 2022-06-12 – 2022-06-13 (×3): 5 mg via ORAL
  Filled 2022-06-11 (×3): qty 1

## 2022-06-11 MED ORDER — LATANOPROST 0.005 % OP SOLN
1.0000 [drp] | Freq: Every day | OPHTHALMIC | Status: DC
  Administered 2022-06-12: 1 [drp] via OPHTHALMIC
  Filled 2022-06-11 (×2): qty 2.5

## 2022-06-11 MED ORDER — DULOXETINE HCL 60 MG PO CPEP
60.0000 mg | ORAL_CAPSULE | Freq: Every day | ORAL | Status: DC
  Administered 2022-06-12 – 2022-06-14 (×3): 60 mg via ORAL
  Filled 2022-06-11 (×3): qty 1

## 2022-06-11 MED ORDER — DEXAMETHASONE SODIUM PHOSPHATE 4 MG/ML IJ SOLN
INTRAMUSCULAR | Status: DC | PRN
  Administered 2022-06-11: 10 mg via PERINEURAL

## 2022-06-11 NOTE — Anesthesia Preprocedure Evaluation (Signed)
Anesthesia Evaluation  Patient identified by MRN, date of birth, ID band Patient awake    Reviewed: Allergy & Precautions, NPO status , Patient's Chart, lab work & pertinent test results, reviewed documented beta blocker date and time   Airway Mallampati: II  TM Distance: >3 FB Neck ROM: Full    Dental  (+) Dental Advisory Given, Upper Dentures, Edentulous Lower   Pulmonary Current Smoker and Patient abstained from smoking.,    Pulmonary exam normal breath sounds clear to auscultation       Cardiovascular hypertension, Pt. on home beta blockers Normal cardiovascular exam+ dysrhythmias Atrial Fibrillation  Rhythm:Regular Rate:Normal     Neuro/Psych PSYCHIATRIC DISORDERS Depression Bipolar Disorder negative neurological ROS     GI/Hepatic negative GI ROS, Neg liver ROS,   Endo/Other  diabetes, Type 2  Renal/GU negative Renal ROS     Musculoskeletal  (+) Arthritis ,   Abdominal   Peds  Hematology negative hematology ROS (+)   Anesthesia Other Findings Day of surgery medications reviewed with the patient.  Reproductive/Obstetrics                             Anesthesia Physical Anesthesia Plan  ASA: 3  Anesthesia Plan: Regional   Post-op Pain Management: Regional block*   Induction:   PONV Risk Score and Plan: 1 and Treatment may vary due to age or medical condition  Airway Management Planned: Natural Airway  Additional Equipment:   Intra-op Plan:   Post-operative Plan:   Informed Consent: I have reviewed the patients History and Physical, chart, labs and discussed the procedure including the risks, benefits and alternatives for the proposed anesthesia with the patient or authorized representative who has indicated his/her understanding and acceptance.       Plan Discussed with:   Anesthesia Plan Comments:         Anesthesia Quick Evaluation

## 2022-06-11 NOTE — ED Notes (Signed)
Pt resting in bed, very lethargic after pain medications. Pt is easy to wake up,is alert and oriented   Pt state the she feels to tired at this time to take her night time medications

## 2022-06-11 NOTE — Assessment & Plan Note (Addendum)
Seen on CXR today. 1. Getting CT chest as recommended. 1. CT chest actually shows findings suspicious for PNA in addition to 1cm nodule. 2. No O2 requirement, no resp symptoms despite the CT findings. 3. No WBC, no fever no SIRS. 4. However, in an abundance of caution, Ill go ahead and start pt on rocephin / azithro for the moment while we wait for pro calcitonin to come back.  I dont want to risk missing a PNA then having bacteria spread to ortho's hardware following surgical repair of femur. 2. Dont think that CT findings will need to delay surgery. 3. Consider pulm consult for the nodule for sampling vs outpt follow up. 4. MRSA PCR nares

## 2022-06-11 NOTE — Progress Notes (Signed)
Orthopedic Tech Progress Note Patient Details:  TAHEERA THOMANN 1951-04-12 [id number] Knee immobilizer was applied in an awkward position due to the patients leg being deformed   Ortho Devices Type of Ortho Device: Knee Immobilizer Ortho Device/Splint Location: Left knee Ortho Device/Splint Interventions: Application   Post Interventions Patient Tolerated: Well  Linus Salmons Vyolet Sakuma 06/11/2022, 9:27 PM

## 2022-06-11 NOTE — Discharge Instructions (Signed)
Irregular nodular opacity seen in right upper lobe. CT scan of the  chest is recommended to rule out neoplasm.

## 2022-06-11 NOTE — Anesthesia Procedure Notes (Signed)
Anesthesia Regional Block: Femoral nerve block   Pre-Anesthetic Checklist: , timeout performed,  Correct Patient, Correct Site, Correct Laterality,  Correct Procedure, Correct Position, site marked,  Risks and benefits discussed,  Surgical consent,  Pre-op evaluation,  At surgeon's request and post-op pain management  Laterality: Left  Prep: chloraprep       Needles:  Injection technique: Single-shot  Needle Type: Echogenic Stimulator Needle     Needle Length: 9cm  Needle Gauge: 21     Additional Needles:   Narrative:  Start time: 06/11/2022 8:26 PM End time: 06/11/2022 8:33 PM Injection made incrementally with aspirations every 5 mL.  Performed by: Personally  Anesthesiologist: Santa Lighter, MD  Additional Notes: Pt. tolerated procedure well. Good perineural spread visualized on [place].

## 2022-06-11 NOTE — Assessment & Plan Note (Signed)
Doesn't appear to be on any meds at SNF. Putting on sensitive SSI Q4H while here.

## 2022-06-11 NOTE — ED Provider Notes (Signed)
[facility] EMERGENCY DEPARTMENT Provider Note   CSN: [id number] Arrival date & time: 06/11/22  1744     History {Add pertinent medical, surgical, social history, OB history to HPI:1} No chief complaint on file.   [name] is a 71 y.o. female.  HPI  Patient is presenting to the emergency department from her nursing home facility after experiencing a fall from a wheelchair.  Patient states that she fell out of the wheelchair directly onto her left leg.  Patient denies any head trauma, nausea, vomiting, dizziness, chest pain, abdominal pain.  Patient had immediate pain in her left hip.  Patient reportedly underwent x-rays at her nursing facility which found a left femur fracture.  Patient was then sent to [facility] emergency department for further evaluation.  Patient was hemodynamically stable in route with EMS.  Given 100 mcg of fentanyl with EMS.  Patient not complaining of pain anywhere else outside of her left upper hip.  To move her left foot.    Home Medications Prior to Admission medications   Medication Sig Start Date End Date Taking? Authorizing Provider  doxycycline (VIBRA-TABS) 100 MG tablet Take 1 tablet (100 mg total) by mouth 2 (two) times daily. 03/10/20   Nadara Mustard, MD  doxycycline (VIBRAMYCIN) 100 MG capsule Take 1 capsule (100 mg total) by mouth 2 (two) times daily. 03/01/20   Jacalyn Lefevre, MD  DULoxetine (CYMBALTA) 60 MG capsule Take 60 mg by mouth daily.    [provider]  gabapentin (NEURONTIN) 300 MG capsule Take 2 capsules (600 mg total) by mouth 3 (three) times daily. 06/29/19   Myrene Buddy, MD  metoprolol tartrate (LOPRESSOR) 25 MG tablet Take 0.5 tablets (12.5 mg total) by mouth 2 (two) times daily. 12/29/19   Mujtaba, Mohammadtokir, MD  Multiple Vitamins-Minerals (CERTAVITE/ANTIOXIDANTS PO) Take 1 tablet by mouth daily.    [provider]  naproxen sodium (ALEVE) 220 MG tablet Take 220 mg by mouth daily as needed  (pain).    [provider]  nutrition supplement, JUVEN, (JUVEN) PACK Take 1 packet by mouth 2 (two) times daily between meals. 12/29/19   Mujtaba, Mohammadtokir, MD  oxyCODONE (OXY IR/ROXICODONE) 5 MG immediate release tablet Take 1-2 tablets (5-10 mg total) by mouth every 4 (four) hours as needed for moderate pain (pain score 4-6). 02/23/20   Persons, [place], PA  furosemide (LASIX) 20 MG tablet Take 0.5 tablets (10 mg total) by mouth daily. Patient not taking: Reported on 04/18/2018 12/10/17 05/04/19  Arthor Captain, PA-C      Allergies    Codeine and Tylenol [acetaminophen]    Review of Systems   Review of Systems  Physical Exam Updated Vital Signs There were no vitals taken for this visit. Physical Exam Vitals and nursing note reviewed.  Constitutional:      General: She is not in acute distress.    Appearance: She is well-developed.     Comments: Chronically ill appearing  HENT:     Head: Normocephalic and atraumatic.  Eyes:     Conjunctiva/sclera: Conjunctivae normal.  Cardiovascular:     Rate and Rhythm: Normal rate and regular rhythm.     Heart sounds: No murmur heard. Pulmonary:     Effort: Pulmonary effort is normal. No respiratory distress.     Breath sounds: Normal breath sounds.  Abdominal:     Palpations: Abdomen is soft.     Tenderness: There is no abdominal tenderness.  Musculoskeletal:  General: No swelling.     Cervical back: Neck supple.     Comments: Right BKA, tenderness palpation on left posterior hip and left mid femur. Shortening of LLE  Skin:    General: Skin is warm and dry.     Capillary Refill: Capillary refill takes less than 2 seconds.  Neurological:     General: No focal deficit present.     Mental Status: She is alert and oriented to person, place, and time. Mental status is at baseline.  Psychiatric:        Mood and Affect: Mood normal.     ED Results / Procedures / Treatments   Labs (all labs ordered are listed, but  only abnormal results are displayed) Labs Reviewed - No data to display  EKG None  Radiology No results found.  Procedures Procedures  {Document cardiac monitor, telemetry assessment procedure when appropriate:1}  Medications Ordered in ED Medications - No data to display  ED Course/ Medical Decision Making/ A&P                          Medical Decision Making Amount and/or Complexity of Data Reviewed Labs: ordered. Decision-making details documented in ED Course. Radiology: ordered. Decision-making details documented in ED Course. ECG/medicine tests:  Decision-making details documented in ED Course.  Risk Decision regarding hospitalization.   Medical Decision Making  This patient is Presenting for Evaluation of fall, concern for left hip fracture, patient has a past medical history A-fib, type 2 diabetes, osteomyelitis of the right foot resulting in a BKA which complicates their presentation.  Of which does require a range of treatment options, and is a complaint that involves a high risk of morbidity and mortality.  Arrived in ED by: POV  History obtained from: The patient  Limitations in history: none    At this time I am most concerned for a femur fracture. Also considering of hip fracture, musculoskeletal injury. Plan for laboratory and imaging study work-up   Laboratory work-up significant for:  -CBC, CMP unremarkable for acute abnormality.   Radiologic work-up was significant for: -X-ray of the pelvis and chest unremarkable.  X-ray of the left femur reveals evidence of severely comminuted and angulated left femur fracture.    Interventions and Interval History: -Patient is generally well-appearing on my initial exam, was not having significant pain.  Patient had no obvious signs of significant trauma outside of shortening of her left lower extremity.  Patient a relatively low mechanism of the fall, she is not on any anticoagulation, she had no head trauma does  not have any pain in her abdomen, chest, back, do not feel the patient requires full trauma imaging studies at this time. -Spoke with orthopedic surgery, patient will require operative management.  Requires admission to the hospitalist service, make n.p.o. midnight.     Disposition: Due to the patients current presenting symptoms, physical exam findings, and the workup stated above, it is thought that the etiology of the patients current presentation is ground-level fall resulting in a significant left femur fracture   ADMIT: Patient is thought to require admission for operative management. Patient will be admitted to hospitalist service. Please see in patient provider note for additional treatment plan details.    The plan for this patient was discussed with Dr. Jodi Mourning, who voiced agreement and who oversaw evaluation and treatment of this patient.     Clinical Complexity  A medically appropriate history, review of systems, and physical  exam was performed.   I personally reviewed the lab and imaging studies discussed above.   MDM generated using voice dictation software and may contain dictation errors. Please contact me for any clarification or with any questions.         {Document critical care time when appropriate:1} {Document review of labs and clinical decision tools ie heart score, Chads2Vasc2 etc:1}  {Document your independent review of radiology images, and any outside records:1} {Document your discussion with family members, caretakers, and with consultants:1} {Document social determinants of health affecting pt's care:1} {Document your decision making why or why not admission, treatments were needed:1} Final Clinical Impression(s) / ED Diagnoses Final diagnoses:  None    Rx / DC Orders ED Discharge Orders     None

## 2022-06-11 NOTE — Progress Notes (Signed)
Patient ID: [name], female   DOB: Mar 30, 1951, 71 y.o.   MRN: [id number]  Consult received  I will have Silvestre Gunner perform formal consult in the am and address any surgical needs with Ortho Trauma or Duda   NPO after MN for now in case of procedure tomorrow

## 2022-06-11 NOTE — ED Triage Notes (Signed)
Pt BIB [facility] from [facility] due to falling out of her wheelchair onto her left leg.  At facility they did a portable X-ray and stated it was a femur fracture.  Pt does have a right below the knee amputation.  Hx A-fib.  22g left forearm.  190mcg of fentanyl given en route.  VS BP 130/62, HR 82, Resp 18, SpO2 95%.  CBG 176.

## 2022-06-11 NOTE — Assessment & Plan Note (Signed)
Cont metoprolol Not on AC.

## 2022-06-11 NOTE — Assessment & Plan Note (Addendum)
1. Femur fx pathway 2. NPO after MN 3. Ortho planning on OR tomorrow 4. Knee immobilizer 5. Dilaudid IV PRN pain 6. GUPTA score = 1.9%

## 2022-06-11 NOTE — ED Notes (Signed)
Pt transported to PACU for block. Will return to ED room after procedure.

## 2022-06-11 NOTE — H&P (Addendum)
History and Physical    Patient: [name] OVF:[id number] DOB: 09/23/1950 DOA: 06/11/2022 DOS: the patient was seen and examined on 06/11/2022 PCP: Maury Dus, MD  Patient coming from: SNF  Chief Complaint:  Chief Complaint  Patient presents with   Fall    L Femur Fracture   HPI: [name] is a 71 y.o. female with medical history significant of PAD, R BKA, DM2.  Pt fell out of wheelchair at [facility] onto L leg.  Severe pain and deformity to L leg after fall.  Port X ray showed femur fx.  Pt in to ED.  Pt with severe pain to L leg worse with any movement.  Better after the nerve block placed by anesthesia.   Review of Systems: As mentioned in the history of present illness. All other systems reviewed and are negative. Past Medical History:  Diagnosis Date   Arthritis    Hypoglycemia    Pneumonia, pneumococcal [facility])    Past Surgical History:  Procedure Laterality Date   ABDOMINAL HYSTERECTOMY     AMPUTATION Right 02/19/2020   Procedure: RIGHT BELOW KNEE AMPUTATION;  Surgeon: Nadara Mustard, MD;  Location: [facility] OR;  Service: Orthopedics;  Laterality: Right;   CESAREAN SECTION     Social History:  reports that she has been smoking. She has been smoking an average of .25 packs per day. She has never used smokeless tobacco. She reports that she does not drink alcohol and does not use drugs.  Allergies  Allergen Reactions   Codeine Other (See Comments)    "Gets high"   Tylenol [Acetaminophen] Hives    Family History  Family history unknown: Yes    Prior to Admission medications   Medication Sig Start Date End Date Taking? Authorizing Provider  Amino Acids-Protein Hydrolys (PRO-STAT) LIQD Take 30 mLs by mouth daily.   Yes [provider]  DULoxetine (CYMBALTA) 60 MG capsule Take 60 mg by mouth daily.   Yes [provider]  furosemide (LASIX) 40 MG tablet Take 40 mg by mouth daily. 04/19/22  Yes [provider]  gabapentin (NEURONTIN)  300 MG capsule Take 2 capsules (600 mg total) by mouth 3 (three) times daily. 06/29/19  Yes Myrene Buddy, MD  hydrOXYzine (VISTARIL) 25 MG capsule Take 25 mg by mouth daily. 04/27/22  Yes [provider]  latanoprost (XALATAN) 0.005 % ophthalmic solution Place 1 drop into both eyes at bedtime. 03/21/22  Yes [provider]  metoprolol tartrate (LOPRESSOR) 25 MG tablet Take 0.5 tablets (12.5 mg total) by mouth 2 (two) times daily. 12/29/19  Yes Mujtaba, Mohammadtokir, MD  Multiple Vitamin (MULTIVITAMIN ADULT PO) Take 1 tablet by mouth daily.   Yes [provider]  OLANZapine zydis (ZYPREXA) 5 MG disintegrating tablet Take 5 mg by mouth at bedtime. 04/24/22  Yes [provider]  traMADol (ULTRAM) 50 MG tablet Take 50 mg by mouth every 6 (six) hours as needed for moderate pain. 06/11/22  Yes [provider]  Zinc Oxide (DESITIN EX) Apply 1 Application topically 2 (two) times daily.   Yes [provider]    Physical Exam: Vitals:   06/11/22 1758 06/11/22 1759 06/11/22 1836  BP: 127/67  (!) 118/59  Pulse: 72  74  Resp: 13  16  Temp: 98.1 F (36.7 C)    TempSrc: Oral    SpO2: 92%  97%  Weight:  76.2 kg   Height:  5\' 6"  (1.676 m)    Constitutional: Chronically ill  appearing Eyes: PERRL, lids and conjunctivae normal ENMT: Mucous membranes are moist. Posterior pharynx clear of any exudate or lesions.Normal dentition.  Neck: normal, supple, no masses, no thyromegaly Respiratory: clear to auscultation bilaterally, no wheezing, no crackles. Normal respiratory effort. No accessory muscle use.  Cardiovascular: Regular rate and rhythm, no murmurs / rubs / gallops. No extremity edema. 2+ pedal pulses. No carotid bruits.  Abdomen: no tenderness, no masses palpated. No hepatosplenomegaly. Bowel sounds positive.  Musculoskeletal: R BKA, TTP L femur Skin: no rashes, lesions, ulcers. No induration Neurologic: R BKA, s/p nerve block on left Psychiatric:  Normal judgment and insight. Alert and oriented x 3. Normal mood.   Data Reviewed:        Latest Ref Rng & Units 06/11/2022    6:05 PM 02/19/2020    1:17 PM 12/29/2019    5:00 AM  CBC  WBC 4.0 - 10.5 K/uL 7.4  4.8  3.8   Hemoglobin 12.0 - 15.0 g/dL 12.0  9.9  8.5   Hematocrit 36.0 - 46.0 % 38.9  34.3  29.8   Platelets 150 - 400 K/uL 237  295  PLATELET CLUMPS NOTED ON SMEAR, UNABLE TO ESTIMATE       Latest Ref Rng & Units 06/11/2022    6:05 PM 02/19/2020    1:17 PM 12/29/2019    5:00 AM  CMP  Glucose 70 - 99 mg/dL 138  82  107   BUN 8 - 23 mg/dL 25  16  22    Creatinine 0.44 - 1.00 mg/dL 0.92  0.97  0.98   Sodium 135 - 145 mmol/L 139  140  138   Potassium 3.5 - 5.1 mmol/L 4.0  4.3  4.6   Chloride 98 - 111 mmol/L 101  105  104   CO2 22 - 32 mmol/L 27  26  29    Calcium 8.9 - 10.3 mg/dL 9.0  8.9  8.4   Total Protein 6.5 - 8.1 g/dL 7.7     Total Bilirubin 0.3 - 1.2 mg/dL 0.3     Alkaline Phos 38 - 126 U/L 96     AST 15 - 41 U/L 28     ALT 0 - 44 U/L 18      CXR: IMPRESSION: Irregular nodular opacity seen in right upper lobe. CT scan of the chest is recommended to rule out neoplasm.  X ray femur L: IMPRESSION: Severely angulated and comminuted distal left femoral shaft fracture.  CT chest: IMPRESSION: 1. Patchy airspace and tree-in-bud opacities in the right upper lobe and superior segment of the right lower lobe worrisome for infection. 2. 1 cm nodular density in the right lung apex. This may be related to infectious/inflammatory process, but neoplasm can not be excluded. Per Fleischner Society Guidelines, consider a non-contrast Chest CT at 3 months, a PET/CT, or tissue sampling. These guidelines do not apply to immunocompromised patients and patients with cancer. Follow up in patients with significant comorbidities as clinically warranted. For lung cancer screening, adhere to Lung-RADS guidelines. Reference: Radiology. 2017; 284(1):228-43. 3. Mild mediastinal and  right hilar lymphadenopathy, likely reactive. 4. Cardiomegaly. 5. Cholelithiasis. 6. Small thyroid nodules measuring up to 7 mm. No follow-up imaging is recommended. Reference: J Am Coll Radiol. 2015 Feb;12(2): 143-50  Assessment and Plan: * Closed fracture of distal end of left femur, unspecified fracture morphology, initial encounter ([place]) Femur fx pathway NPO after MN Ortho planning on OR tomorrow Knee immobilizer Dilaudid IV PRN pain GUPTA score = 1.9%  Lung nodule  seen on imaging study Seen on CXR today. Getting CT chest as recommended. CT chest actually shows findings suspicious for PNA in addition to 1cm nodule. No O2 requirement, no resp symptoms despite the CT findings. No WBC, no fever no SIRS. However, in an abundance of caution, Ill go ahead and start pt on rocephin / azithro for the moment while we wait for pro calcitonin to come back.  I dont want to risk missing a PNA then having bacteria spread to ortho's hardware following surgical repair of femur. Dont think that CT findings will need to delay surgery. Consider pulm consult for the nodule for sampling vs outpt follow up. MRSA PCR nares  Controlled type 2 diabetes mellitus with circulatory disorder, without long-term current use of insulin (HCC) Doesn't appear to be on any meds at SNF. Putting on sensitive SSI Q4H while here.  Atrial fibrillation (HCC) Cont metoprolol Not on AC.      Advance Care Planning:   Code Status: Full Code  Consults: EDP spoke with Dr. Charlann Boxer  Family Communication: No family in room  Severity of Illness: The appropriate patient status for this patient is INPATIENT. Inpatient status is judged to be reasonable and necessary in order to provide the required intensity of service to ensure the patient's safety. The patient's presenting symptoms, physical exam findings, and initial radiographic and laboratory data in the context of their chronic comorbidities is felt to place them at high  risk for further clinical deterioration. Furthermore, it is not anticipated that the patient will be medically stable for discharge from the hospital within 2 midnights of admission.   * I certify that at the point of admission it is my clinical judgment that the patient will require inpatient hospital care spanning beyond 2 midnights from the point of admission due to high intensity of service, high risk for further deterioration and high frequency of surveillance required.*  Author: Hillary Bow., DO 06/11/2022 7:39 PM  For on call review [email or web address].

## 2022-06-12 DIAGNOSIS — S72402A Unspecified fracture of lower end of left femur, initial encounter for closed fracture: Secondary | ICD-10-CM

## 2022-06-12 LAB — GLUCOSE, CAPILLARY
Glucose-Capillary: 167 mg/dL — ABNORMAL HIGH (ref 70–99)
Glucose-Capillary: 145 mg/dL — ABNORMAL HIGH (ref 70–99)
Glucose-Capillary: 118 mg/dL — ABNORMAL HIGH (ref 70–99)
Glucose-Capillary: 106 mg/dL — ABNORMAL HIGH (ref 70–99)
Glucose-Capillary: 136 mg/dL — ABNORMAL HIGH (ref 70–99)
Glucose-Capillary: 125 mg/dL — ABNORMAL HIGH (ref 70–99)

## 2022-06-12 LAB — PROCALCITONIN: Procalcitonin: <0.1 ng/mL

## 2022-06-12 LAB — MRSA NEXT GEN BY PCR, NASAL: MRSA by PCR Next Gen: NOT DETECTED

## 2022-06-12 LAB — CBG MONITORING, ED: Glucose-Capillary: 198 mg/dL — ABNORMAL HIGH (ref 70–99)

## 2022-06-12 MED ORDER — ENSURE ENLIVE PO LIQD
237.0000 mL | Freq: Two times a day (BID) | ORAL | Status: DC
  Administered 2022-06-14: 237 mL via ORAL

## 2022-06-12 NOTE — Consult Note (Signed)
Reason for Consult:Left femur fx Referring Physician: Pamella Pert Time called: [id number] Time at bedside: 0933   [name] is an 71 y.o. female.  HPI: [name] was at the facility where she resides when she fell out of her WC onto the floor. She had immediate left leg pain and could not get up. She was brought to the ED where x-rays showed a left femur fx and orthopedic surgery was consulted. She is essentially WC bound and may have some undiagnosed dementia vs acute delirium as she told me she was living at home with her mom.  Past Medical History:  Diagnosis Date   Arthritis    Hypoglycemia    Pneumonia, pneumococcal [facility])     Past Surgical History:  Procedure Laterality Date   ABDOMINAL HYSTERECTOMY     AMPUTATION Right 02/19/2020   Procedure: RIGHT BELOW KNEE AMPUTATION;  Surgeon: Nadara Mustard, MD;  Location: [facility] OR;  Service: Orthopedics;  Laterality: Right;   CESAREAN SECTION      Family History  Family history unknown: Yes    Social History:  reports that she has been smoking. She has been smoking an average of .25 packs per day. She has never used smokeless tobacco. She reports that she does not drink alcohol and does not use drugs.  Allergies:  Allergies  Allergen Reactions   Codeine Other (See Comments)    "Gets high"   Tylenol [Acetaminophen] Hives    Medications: I have reviewed the patient's current medications.  Results for orders placed or performed during the hospital encounter of 06/11/22 (from the past 48 hour(s))  CBC with Differential     Status: None   Collection Time: 06/11/22  6:05 PM  Result Value Ref Range   WBC 7.4 4.0 - 10.5 K/uL   RBC 4.20 3.87 - 5.11 MIL/uL   Hemoglobin 12.0 12.0 - 15.0 g/dL   HCT 65.4 65.0 - 35.4 %   MCV 92.6 80.0 - 100.0 fL   MCH 28.6 26.0 - 34.0 pg   MCHC 30.8 30.0 - 36.0 g/dL   RDW 65.6 81.2 - 75.1 %   Platelets 237 150 - 400 K/uL   nRBC 0.0 0.0 - 0.2 %   Neutrophils Relative % 74 %   Neutro Abs 5.6 1.7 - 7.7 K/uL    Lymphocytes Relative 14 %   Lymphs Abs 1.0 0.7 - 4.0 K/uL   Monocytes Relative 8 %   Monocytes Absolute 0.6 0.1 - 1.0 K/uL   Eosinophils Relative 2 %   Eosinophils Absolute 0.1 0.0 - 0.5 K/uL   Basophils Relative 1 %   Basophils Absolute 0.1 0.0 - 0.1 K/uL   Immature Granulocytes 1 %   Abs Immature Granulocytes 0.04 0.00 - 0.07 K/uL    Comment: Performed at [facility], [address], [place], [place] [address]  Comprehensive metabolic panel     Status: Abnormal   Collection Time: 06/11/22  6:05 PM  Result Value Ref Range   Sodium 139 135 - 145 mmol/L   Potassium 4.0 3.5 - 5.1 mmol/L   Chloride 101 98 - 111 mmol/L   CO2 27 22 - 32 mmol/L   Glucose, Bld 138 (H) 70 - 99 mg/dL    Comment: Glucose reference range applies only to samples taken after fasting for at least 8 hours.   BUN 25 (H) 8 - 23 mg/dL   Creatinine, Ser 4.94 0.44 - 1.00 mg/dL   Calcium 9.0 8.9 - 49.6 mg/dL  Total Protein 7.7 6.5 - 8.1 g/dL   Albumin 3.4 (L) 3.5 - 5.0 g/dL   AST 28 15 - 41 U/L   ALT 18 0 - 44 U/L   Alkaline Phosphatase 96 38 - 126 U/L   Total Bilirubin 0.3 0.3 - 1.2 mg/dL   GFR, Estimated >60 >60 mL/min    Comment: (NOTE) Calculated using the CKD-EPI Creatinine Equation (2021)    Anion gap 11 5 - 15    Comment: Performed at [facility] [address], [place], [place] [address]  CBG monitoring, ED     Status: Abnormal   Collection Time: 06/11/22  9:32 PM  Result Value Ref Range   Glucose-Capillary 139 (H) 70 - 99 mg/dL    Comment: Glucose reference range applies only to samples taken after fasting for at least 8 hours.  Hemoglobin A1c     Status: None   Collection Time: 06/11/22  9:35 PM  Result Value Ref Range   Hgb A1c MFr Bld 5.6 4.8 - 5.6 %    Comment: (NOTE) Pre diabetes:          5.7%-6.4%  Diabetes:              >6.4%  Glycemic control for   <7.0% adults with diabetes    Mean Plasma Glucose 114.02 mg/dL    Comment: Performed at [facility] [address] [place], [place] [address]  CBG monitoring, ED     Status: Abnormal   Collection Time: 06/12/22 12:11 AM  Result Value Ref Range   Glucose-Capillary 198 (H) 70 - 99 mg/dL    Comment: Glucose reference range applies only to samples taken after fasting for at least 8 hours.  MRSA Next Gen by PCR, Nasal     Status: None   Collection Time: 06/12/22  2:10 AM   Specimen: Nasal Mucosa; Nasal Swab  Result Value Ref Range   MRSA by PCR Next Gen NOT DETECTED NOT DETECTED    Comment: (NOTE) The GeneXpert MRSA Assay (FDA approved for NASAL specimens only), is one component of a comprehensive MRSA colonization surveillance program. It is not intended to diagnose MRSA infection nor to guide or monitor treatment for MRSA infections. Test performance is not FDA approved in patients less than 52 years old. Performed at [facility], [place] [address], [place], [place] [address]   Procalcitonin - Baseline     Status: None   Collection Time: 06/12/22  2:59 AM  Result Value Ref Range   Procalcitonin <0.10 ng/mL    Comment:        Interpretation: PCT (Procalcitonin) <= 0.5 ng/mL: Systemic infection (sepsis) is not likely. Local bacterial infection is possible. (NOTE)       Sepsis PCT Algorithm           Lower Respiratory Tract                                      Infection PCT Algorithm    ----------------------------     ----------------------------         PCT < 0.25 ng/mL                PCT < 0.10 ng/mL          Strongly encourage             Strongly discourage   discontinuation of antibiotics  initiation of antibiotics    ----------------------------     -----------------------------       PCT 0.25 - 0.50 ng/mL            PCT 0.10 - 0.25 ng/mL               OR       >80% decrease in PCT            Discourage initiation of                                            antibiotics      Encourage discontinuation           of antibiotics    ----------------------------      -----------------------------         PCT >= 0.50 ng/mL              PCT 0.26 - 0.50 ng/mL               AND        <80% decrease in PCT             Encourage initiation of                                             antibiotics       Encourage continuation           of antibiotics    ----------------------------     -----------------------------        PCT >= 0.50 ng/mL                  PCT > 0.50 ng/mL               AND         increase in PCT                  Strongly encourage                                      initiation of antibiotics    Strongly encourage escalation           of antibiotics                                     -----------------------------                                           PCT <= 0.25 ng/mL                                                 OR                                        >  80% decrease in PCT                                      Discontinue / Do not initiate                                             antibiotics  Performed at [facility], [address] [place], [place] [address]   Glucose, capillary     Status: Abnormal   Collection Time: 06/12/22  5:25 AM  Result Value Ref Range   Glucose-Capillary 167 (H) 70 - 99 mg/dL    Comment: Glucose reference range applies only to samples taken after fasting for at least 8 hours.  Glucose, capillary     Status: Abnormal   Collection Time: 06/12/22  8:04 AM  Result Value Ref Range   Glucose-Capillary 145 (H) 70 - 99 mg/dL    Comment: Glucose reference range applies only to samples taken after fasting for at least 8 hours.    CT CHEST W CONTRAST  Result Date: 06/11/2022 CLINICAL DATA:  Lung nodule. EXAM: CT CHEST WITH CONTRAST TECHNIQUE: Multidetector CT imaging of the chest was performed during intravenous contrast administration. RADIATION DOSE REDUCTION: This exam was performed according to the departmental dose-optimization program which includes automated exposure control,  adjustment of the mA and/or kV according to patient size and/or use of iterative reconstruction technique. CONTRAST:  69mL OMNIPAQUE IOHEXOL 350 MG/ML SOLN COMPARISON:  Chest x-ray same day FINDINGS: Cardiovascular: Heart is enlarged. There is no pericardial effusion. Aorta is normal in size. There are atherosclerotic calcifications of the aorta and coronary arteries. Mediastinum/Nodes: Hypodense thyroid nodules are present measuring up 2 7 mm. Mildly enlarged precarinal lymph node measures 12 mm. Mildly enlarged subcarinal lymph node measures 11 mm. Mildly enlarged right hilar lymph node measures 10 mm. Small hiatal hernia is present. Visualized esophagus is within normal limits. Lungs/Pleura: Patchy airspace and tree-in-bud opacities are seen in the posteroinferior right upper lobe and minimally in the superior segment of the right lower lobe. Nodular density is seen in the posterior right lung apex measuring 1 cm image 5/33. There are minimal emphysematous changes in the right lung apex. There is no pleural effusion or pneumothorax. Upper Abdomen: Gallstones are present. Musculoskeletal: No chest wall abnormality. No acute or significant osseous findings. IMPRESSION: 1. Patchy airspace and tree-in-bud opacities in the right upper lobe and superior segment of the right lower lobe worrisome for infection. 2. 1 cm nodular density in the right lung apex. This may be related to infectious/inflammatory process, but neoplasm can not be excluded. Per Fleischner Society Guidelines, consider a non-contrast Chest CT at 3 months, a PET/CT, or tissue sampling. These guidelines do not apply to immunocompromised patients and patients with cancer. Follow up in patients with significant comorbidities as clinically warranted. For lung cancer screening, adhere to Lung-RADS guidelines. Reference: Radiology. 2017; 284(1):228-43. 3. Mild mediastinal and right hilar lymphadenopathy, likely reactive. 4. Cardiomegaly. 5. Cholelithiasis.  6. Small thyroid nodules measuring up to 7 mm. No follow-up imaging is recommended. Reference: J Am Coll Radiol. 2015 Feb;12(2): 143-50 Aortic Atherosclerosis (ICD10-I70.0). Electronically Signed   By: Darliss Cheney M.D.   On: 06/11/2022 20:25   DG CHEST PORT 1 VIEW  Result Date: 06/11/2022 CLINICAL DATA:  Fall. EXAM:  PORTABLE CHEST 1 VIEW COMPARISON:  June 18, 2019. FINDINGS: Mild cardiomegaly is noted. Left lung is clear. Irregular nodular opacity is noted in right upper lobe. Old proximal left humeral fracture is noted. IMPRESSION: Irregular nodular opacity seen in right upper lobe. CT scan of the chest is recommended to rule out neoplasm. Electronically Signed   By: Lupita Raider M.D.   On: 06/11/2022 18:37   DG Femur Portable Min 2 Views Left  Result Date: 06/11/2022 CLINICAL DATA:  Fall. EXAM: LEFT FEMUR PORTABLE 2 VIEWS COMPARISON:  None Available. FINDINGS: Severely angulated and comminuted fracture is seen involving the distal left femoral shaft. IMPRESSION: Severely angulated and comminuted distal left femoral shaft fracture. Electronically Signed   By: Lupita Raider M.D.   On: 06/11/2022 18:35   DG Pelvis Portable  Result Date: 06/11/2022 CLINICAL DATA:  Fall. EXAM: PORTABLE PELVIS 1-2 VIEWS COMPARISON:  None Available. FINDINGS: There is no evidence of pelvic fracture or diastasis. No pelvic bone lesions are seen. IMPRESSION: Negative. Electronically Signed   By: Lupita Raider M.D.   On: 06/11/2022 18:34    Review of Systems  HENT:  Negative for ear discharge, ear pain, hearing loss and tinnitus.   Eyes:  Negative for photophobia and pain.  Respiratory:  Negative for cough and shortness of breath.   Cardiovascular:  Negative for chest pain.  Gastrointestinal:  Negative for abdominal pain, nausea and vomiting.  Genitourinary:  Negative for dysuria, flank pain, frequency and urgency.  Musculoskeletal:  Positive for arthralgias (Left thigh). Negative for back pain, myalgias  and neck pain.  Neurological:  Negative for dizziness and headaches.  Hematological:  Does not bruise/bleed easily.  Psychiatric/Behavioral:  The patient is not nervous/anxious.    Blood pressure (!) 117/56, pulse 73, temperature 97.6 F (36.4 C), temperature source Oral, resp. rate 14, height 5\' 6"  (1.676 m), weight 76.2 kg, SpO2 93 %. Physical Exam Constitutional:      General: She is not in acute distress.    Appearance: She is well-developed. She is not diaphoretic.  HENT:     Head: Normocephalic and atraumatic.  Eyes:     General: No scleral icterus.       Right eye: No discharge.        Left eye: No discharge.     Conjunctiva/sclera: Conjunctivae normal.  Cardiovascular:     Rate and Rhythm: Normal rate and regular rhythm.  Pulmonary:     Effort: Pulmonary effort is normal. No respiratory distress.  Musculoskeletal:     Cervical back: Normal range of motion.     Comments: LLE No traumatic wounds, ecchymosis, or rash  KI, [place] traction in place  No ankle effusion  Sens DPN, SPN, TN intact  Motor EHL 5/5  DP 1+, No significant edema  Skin:    General: Skin is warm and dry.  Neurological:     Mental Status: She is alert.  Psychiatric:        Mood and Affect: Mood normal.        Behavior: Behavior normal.    Assessment/Plan: Left femur fx -- Plan ORIF tomorrow with Dr. . Please keep NPO after MN. Mutliple medical problems including PAD, R BKA, and DM2 -- per primary service    Lajoyce Corners, PA-C Orthopedic Surgery [phone] 06/12/2022, 9:39 AM

## 2022-06-12 NOTE — Progress Notes (Signed)
PROGRESS NOTE  [name] QIH:[id number] DOB: 10/02/1950 DOA: 06/11/2022 PCP: Maury Dus, MD   LOS: 1 day   Brief Narrative / Interim history: This is a 71 year old female with history of PAD, right BKA, DM 2, fell out of her wheelchair at [facility] onto her left leg.  She had severe pain and deformity to her left leg after the fall, and imaging showed femur fracture.  She was admitted to the hospital and orthopedic surgery was consulted  Subjective / 24h Interval events: She is doing well this morning, pain is a 3/10.  She feels comfortable overall.  Assesement and Plan: Principal Problem:   Closed fracture of distal end of left femur, unspecified fracture morphology, initial encounter (HCC) Active Problems:   Lung nodule seen on imaging study   Atrial fibrillation (HCC)   Controlled type 2 diabetes mellitus with circulatory disorder, without long-term current use of insulin (HCC)   Principal problem Closed fracture of the distal end of the left femur-orthopedic surgery consulted, appreciate input.  She is n.p.o. until she is being evaluated.  Active problems Concern for CAP-imaging on admission shows patchy airspace and tree-in-bud opacity in the right upper lobe concerning for infection, as well as a 1 cm nodular density in the right lung apex, infection versus inflammatory process.  She was having respiratory symptoms with a cough, started on ceftriaxone and azithromycin on admission, continue for no more than 5 days.  She will need to have a repeat CT in 3 months to monitor the nodule in case its not infectious in nature  PAF-continue metoprolol.  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  DM2-has been placed on sliding scale, continue  Lab Results  Component Value Date   HGBA1C 5.6 06/11/2022   CBG (last 3)  Recent Labs    06/12/22 0011 06/12/22 0525 06/12/22 0804  GLUCAP 198*  167* 145*    Scheduled Meds:  DULoxetine  60 mg Oral Daily   gabapentin  600 mg Oral TID   insulin aspart  0-9 Units Subcutaneous Q4H   latanoprost  1 drop Both Eyes QHS   metoprolol tartrate  12.5 mg Oral BID   OLANZapine zydis  5 mg Oral QHS   Continuous Infusions:  azithromycin Stopped (06/11/22 2306)   cefTRIAXone (ROCEPHIN)  IV Stopped (06/11/22 2202)   PRN Meds:.HYDROmorphone (DILAUDID) injection  Current Outpatient Medications  Medication Instructions   Amino Acids-Protein Hydrolys (PRO-STAT) LIQD 30 mLs, Oral, Daily   DULoxetine (CYMBALTA) 60 mg, Oral, Daily   furosemide (LASIX) 40 mg, Oral, Daily   gabapentin (NEURONTIN) 600 mg, Oral, 3 times daily   hydrOXYzine (VISTARIL) 25 mg, Oral, Daily   latanoprost (XALATAN) 0.005 % ophthalmic solution 1 drop, Both Eyes, Daily at bedtime   metoprolol tartrate (LOPRESSOR) 12.5 mg, Oral, 2 times daily   Multiple Vitamin (MULTIVITAMIN ADULT PO) 1 tablet, Oral, Daily   OLANZapine zydis (ZYPREXA) 5 mg, Oral, Daily at bedtime   traMADol (ULTRAM) 50 mg, Oral, Every 6 hours PRN   Zinc Oxide (DESITIN EX) 1 Application, Apply externally, 2 times daily    Diet Orders (From admission, onward)     Start     Ordered   06/11/22 1932  Diet NPO time specified  Diet effective now        06/11/22 1935            DVT prophylaxis: SCDs Start: 06/11/22 1933   Lab Results  Component  Value Date   PLT 237 06/11/2022      Code Status: Full Code  Family Communication: no family at bedside   Status is: Inpatient Remains inpatient appropriate because: severity of illness   Level of care: Med-Surg  Consultants:  Orthopedic surgery   Objective: Vitals:   06/11/22 2345 06/12/22 0120 06/12/22 0150 06/12/22 0800  BP: 136/65 101/60 (!) 119/57 (!) 117/56  Pulse: 82 79 82 73  Resp: 18 10 16 14   Temp:  97.8 F (36.6 C) 98.5 F (36.9 C) 97.6 F (36.4 C)  TempSrc:  Oral Axillary Oral  SpO2: 98% 93% 92% 93%  Weight:      Height:         Intake/Output Summary (Last 24 hours) at 06/12/2022 1014 Last data filed at 06/11/2022 2306 Gross per 24 hour  Intake 250 ml  Output --  Net 250 ml   Wt Readings from Last 3 Encounters:  06/11/22 76.2 kg  04/12/20 93.5 kg  03/15/20 93.5 kg    Examination:  Constitutional: NAD Eyes: no scleral icterus ENMT: Mucous membranes are moist.  Neck: normal, supple Respiratory: clear to auscultation bilaterally, no wheezing, no crackles. Normal respiratory effort. No accessory muscle use.  Cardiovascular: Regular rate and rhythm, no murmurs / rubs / gallops. No LE edema.  Abdomen: non distended, no tenderness. Bowel sounds positive.  Musculoskeletal: no clubbing / cyanosis.    Data Reviewed: I have independently reviewed following labs and imaging studies   CBC Recent Labs  Lab 06/11/22 1805  WBC 7.4  HGB 12.0  HCT 38.9  PLT 237  MCV 92.6  MCH 28.6  MCHC 30.8  RDW 14.9  LYMPHSABS 1.0  MONOABS 0.6  EOSABS 0.1  BASOSABS 0.1    Recent Labs  Lab 06/11/22 1805 06/11/22 2135 06/12/22 0259  NA 139  --   --   K 4.0  --   --   CL 101  --   --   CO2 27  --   --   GLUCOSE 138*  --   --   BUN 25*  --   --   CREATININE 0.92  --   --   CALCIUM 9.0  --   --   AST 28  --   --   ALT 18  --   --   ALKPHOS 96  --   --   BILITOT 0.3  --   --   ALBUMIN 3.4*  --   --   PROCALCITON  --   --  <0.10  HGBA1C  --  5.6  --     ------------------------------------------------------------------------------------------------------------------ No results for input(s): "CHOL", "HDL", "LDLCALC", "TRIG", "CHOLHDL", "LDLDIRECT" in the last 72 hours.  Lab Results  Component Value Date   HGBA1C 5.6 06/11/2022   ------------------------------------------------------------------------------------------------------------------ No results for input(s): "TSH", "T4TOTAL", "T3FREE", "THYROIDAB" in the last 72 hours.  Invalid input(s): "FREET3"  Cardiac Enzymes No results for  input(s): "CKMB", "TROPONINI", "MYOGLOBIN" in the last 168 hours.  Invalid input(s): "CK" ------------------------------------------------------------------------------------------------------------------    Component Value Date/Time   BNP 231.2 (H) 12/10/2017 1704    CBG: Recent Labs  Lab 06/11/22 2132 06/12/22 0011 06/12/22 0525 06/12/22 0804  GLUCAP 139* 198* 167* 145*    Recent Results (from the past 240 hour(s))  MRSA Next Gen by PCR, Nasal     Status: None   Collection Time: 06/12/22  2:10 AM   Specimen: Nasal Mucosa; Nasal Swab  Result Value Ref Range Status   MRSA by  PCR Next Gen NOT DETECTED NOT DETECTED Final    Comment: (NOTE) The GeneXpert MRSA Assay (FDA approved for NASAL specimens only), is one component of a comprehensive MRSA colonization surveillance program. It is not intended to diagnose MRSA infection nor to guide or monitor treatment for MRSA infections. Test performance is not FDA approved in patients less than 41 years old. Performed at [facility], [place] [address] [place], [place] [address]      Radiology Studies: CT CHEST W CONTRAST  Result Date: 06/11/2022 CLINICAL DATA:  Lung nodule. EXAM: CT CHEST WITH CONTRAST TECHNIQUE: Multidetector CT imaging of the chest was performed during intravenous contrast administration. RADIATION DOSE REDUCTION: This exam was performed according to the departmental dose-optimization program which includes automated exposure control, adjustment of the mA and/or kV according to patient size and/or use of iterative reconstruction technique. CONTRAST:  71mL OMNIPAQUE IOHEXOL 350 MG/ML SOLN COMPARISON:  Chest x-ray same day FINDINGS: Cardiovascular: Heart is enlarged. There is no pericardial effusion. Aorta is normal in size. There are atherosclerotic calcifications of the aorta and coronary arteries. Mediastinum/Nodes: Hypodense thyroid nodules are present measuring up 2 7 mm. Mildly enlarged precarinal lymph node  measures 12 mm. Mildly enlarged subcarinal lymph node measures 11 mm. Mildly enlarged right hilar lymph node measures 10 mm. Small hiatal hernia is present. Visualized esophagus is within normal limits. Lungs/Pleura: Patchy airspace and tree-in-bud opacities are seen in the posteroinferior right upper lobe and minimally in the superior segment of the right lower lobe. Nodular density is seen in the posterior right lung apex measuring 1 cm image 5/33. There are minimal emphysematous changes in the right lung apex. There is no pleural effusion or pneumothorax. Upper Abdomen: Gallstones are present. Musculoskeletal: No chest wall abnormality. No acute or significant osseous findings. IMPRESSION: 1. Patchy airspace and tree-in-bud opacities in the right upper lobe and superior segment of the right lower lobe worrisome for infection. 2. 1 cm nodular density in the right lung apex. This may be related to infectious/inflammatory process, but neoplasm can not be excluded. Per Fleischner Society Guidelines, consider a non-contrast Chest CT at 3 months, a PET/CT, or tissue sampling. These guidelines do not apply to immunocompromised patients and patients with cancer. Follow up in patients with significant comorbidities as clinically warranted. For lung cancer screening, adhere to Lung-RADS guidelines. Reference: Radiology. 2017; 284(1):228-43. 3. Mild mediastinal and right hilar lymphadenopathy, likely reactive. 4. Cardiomegaly. 5. Cholelithiasis. 6. Small thyroid nodules measuring up to 7 mm. No follow-up imaging is recommended. Reference: J Am Coll Radiol. 2015 Feb;12(2): 143-50 Aortic Atherosclerosis (ICD10-I70.0). Electronically Signed   By: Ronney Asters M.D.   On: 06/11/2022 20:25   DG CHEST PORT 1 VIEW  Result Date: 06/11/2022 CLINICAL DATA:  Fall. EXAM: PORTABLE CHEST 1 VIEW COMPARISON:  June 18, 2019. FINDINGS: Mild cardiomegaly is noted. Left lung is clear. Irregular nodular opacity is noted in right upper  lobe. Old proximal left humeral fracture is noted. IMPRESSION: Irregular nodular opacity seen in right upper lobe. CT scan of the chest is recommended to rule out neoplasm. Electronically Signed   By: Marijo Conception M.D.   On: 06/11/2022 18:37   DG Femur Portable Min 2 Views Left  Result Date: 06/11/2022 CLINICAL DATA:  Fall. EXAM: LEFT FEMUR PORTABLE 2 VIEWS COMPARISON:  None Available. FINDINGS: Severely angulated and comminuted fracture is seen involving the distal left femoral shaft. IMPRESSION: Severely angulated and comminuted distal left femoral shaft fracture. Electronically Signed   By: Jeneen Rinks  Christen Butter M.D.   On: 06/11/2022 18:35   DG Pelvis Portable  Result Date: 06/11/2022 CLINICAL DATA:  Fall. EXAM: PORTABLE PELVIS 1-2 VIEWS COMPARISON:  None Available. FINDINGS: There is no evidence of pelvic fracture or diastasis. No pelvic bone lesions are seen. IMPRESSION: Negative. Electronically Signed   By: Lupita Raider M.D.   On: 06/11/2022 18:34     Pamella Pert, MD, PhD [facility]  Between 7 am - 7 pm I am available, please contact me via Amion (for emergencies) or Securechat (non urgent messages)  Between 7 pm - 7 am I am not available, please contact night coverage MD/APP via Amion

## 2022-06-12 NOTE — Progress Notes (Signed)
Initial Nutrition Assessment  DOCUMENTATION CODES:   Not applicable  INTERVENTION:  Encourage adequate PO intake once diet advanced Meal ordering with assistance  Ensure Enlive po BID, each supplement provides 350 kcal and 20 grams of protein to support hip fracture and post op healing  NUTRITION DIAGNOSIS:   Increased nutrient needs related to hip fracture as evidenced by estimated needs.  GOAL:   Patient will meet greater than or equal to 90% of their needs  MONITOR:   PO intake, Supplement acceptance, Labs, Weight trends  REASON FOR ASSESSMENT:   Consult Hip fracture protocol  ASSESSMENT:   Pt admitted from [facility] after falling out of her wheelchair leading to a L femur fx. PMH significant for PAD, R BKA, T2DM   Ortho consulted. Plans for ORIF tomorrow.   Pt noted with some confusion during assessment as her recall was inconsistent regarding her living situation and she mentioned needing to pick up her food. She reports eating 3 meals per day and 1 snack at [facility]. She denies changes to her PO intake or weight. Pt reports that she shattered her lower plate dentures when she fell. She denies needing chopped meats or soft textured foods as she "loves to chew." Pt requesting coffee and crackers however she remains NPO pending ortho consult. Made nursing aware so these can be offered once her diet advances.   Pt denies recent weight changes. Unfortunately there is limited documentation of weight history within the last year. Current admission weight is 76.2 kg.   Medications: SSI 0-9 units q4h, IV abx  Labs; BUN 25, HgbA1c 5.6%, CBG's 139-198 x24 hours  NUTRITION - FOCUSED PHYSICAL EXAM:  Flowsheet Row Most Recent Value  Orbital Region No depletion  Upper Arm Region No depletion  Thoracic and Lumbar Region No depletion  Buccal Region No depletion  Temple Region Mild depletion  Clavicle Bone Region No depletion  Clavicle and Acromion Bone Region No depletion   Scapular Bone Region No depletion  Dorsal Hand No depletion  Patellar Region Unable to assess  [L leg stabilizer, R BKA]  Anterior Thigh Region Unable to assess  Posterior Calf Region Unable to assess  Edema (RD Assessment) None  Hair Reviewed  Eyes Reviewed  Mouth Other (Comment)  [wears dentures]  Skin Reviewed  Nails Reviewed      Diet Order:   Diet Order             Diet Carb Modified Fluid consistency: Thin; Room service appropriate? Yes  Diet effective now                   EDUCATION NEEDS:   Education needs have been addressed  Skin:  Skin Assessment: Reviewed RN Assessment  Last BM:  PTA  Height:   Ht Readings from Last 1 Encounters:  06/11/22 5\' 6"  (1.676 m)    Weight:   Wt Readings from Last 1 Encounters:  06/11/22 76.2 kg   BMI:  Body mass index is 27.12 kg/m.  Estimated Nutritional Needs:   Kcal:  1700-1900  Protein:  90-105g  Fluid:  >/=1.7L  Clayborne Dana, RDN, LDN Clinical Nutrition

## 2022-06-12 NOTE — Progress Notes (Signed)
Patient now noted to have cough symptoms.  On further questioning pt now tells me that this has been ongoing for about the past 1 week.  Pt already on CAP ABx as noted in my H+P.

## 2022-06-13 ENCOUNTER — Inpatient Hospital Stay (HOSPITAL_COMMUNITY): Payer: Medicaid Other | Admitting: Certified Registered Nurse Anesthetist

## 2022-06-13 ENCOUNTER — Other Ambulatory Visit: Payer: Self-pay

## 2022-06-13 ENCOUNTER — Inpatient Hospital Stay (HOSPITAL_COMMUNITY): Payer: Medicaid Other

## 2022-06-13 ENCOUNTER — Encounter (HOSPITAL_COMMUNITY)
Admission: EM | Disposition: A | Discharge status: Home / Self Care | Payer: Self-pay | Source: Skilled Nursing Facility | Attending: Internal Medicine

## 2022-06-13 ENCOUNTER — Encounter (HOSPITAL_COMMUNITY): Payer: Self-pay | Admitting: Internal Medicine

## 2022-06-13 DIAGNOSIS — R911 Solitary pulmonary nodule: Secondary | ICD-10-CM | POA: Diagnosis not present

## 2022-06-13 DIAGNOSIS — I10 Essential (primary) hypertension: Secondary | ICD-10-CM

## 2022-06-13 DIAGNOSIS — E119 Type 2 diabetes mellitus without complications: Secondary | ICD-10-CM

## 2022-06-13 DIAGNOSIS — E1159 Type 2 diabetes mellitus with other circulatory complications: Secondary | ICD-10-CM | POA: Diagnosis not present

## 2022-06-13 DIAGNOSIS — W19XXXA Unspecified fall, initial encounter: Secondary | ICD-10-CM

## 2022-06-13 DIAGNOSIS — F17200 Nicotine dependence, unspecified, uncomplicated: Secondary | ICD-10-CM

## 2022-06-13 DIAGNOSIS — S72402A Unspecified fracture of lower end of left femur, initial encounter for closed fracture: Secondary | ICD-10-CM | POA: Diagnosis not present

## 2022-06-13 DIAGNOSIS — S7292XA Unspecified fracture of left femur, initial encounter for closed fracture: Secondary | ICD-10-CM

## 2022-06-13 HISTORY — PX: FEMUR IM NAIL: SHX1597

## 2022-06-13 LAB — GLUCOSE, CAPILLARY
Glucose-Capillary: 103 mg/dL — ABNORMAL HIGH (ref 70–99)
Glucose-Capillary: 114 mg/dL — ABNORMAL HIGH (ref 70–99)
Glucose-Capillary: 115 mg/dL — ABNORMAL HIGH (ref 70–99)
Glucose-Capillary: 126 mg/dL — ABNORMAL HIGH (ref 70–99)
Glucose-Capillary: 129 mg/dL — ABNORMAL HIGH (ref 70–99)
Glucose-Capillary: 82 mg/dL (ref 70–99)
Glucose-Capillary: 89 mg/dL (ref 70–99)
Glucose-Capillary: 89 mg/dL (ref 70–99)
Glucose-Capillary: 90 mg/dL (ref 70–99)
Glucose-Capillary: 92 mg/dL (ref 70–99)

## 2022-06-13 LAB — CBC
HCT: 28.2 % — ABNORMAL LOW (ref 36.0–46.0)
Hemoglobin: 8.8 g/dL — ABNORMAL LOW (ref 12.0–15.0)
MCH: 28 pg (ref 26.0–34.0)
MCHC: 31.2 g/dL (ref 30.0–36.0)
MCV: 89.8 fL (ref 80.0–100.0)
Platelets: 215 10*3/uL (ref 150–400)
RBC: 3.14 MIL/uL — ABNORMAL LOW (ref 3.87–5.11)
RDW: 14.9 % (ref 11.5–15.5)
WBC: 7.5 10*3/uL (ref 4.0–10.5)
nRBC: 0 % (ref 0.0–0.2)

## 2022-06-13 LAB — COMPREHENSIVE METABOLIC PANEL
ALT: 17 U/L (ref 0–44)
AST: 19 U/L (ref 15–41)
Albumin: 2.8 g/dL — ABNORMAL LOW (ref 3.5–5.0)
Alkaline Phosphatase: 83 U/L (ref 38–126)
Anion gap: 14 (ref 5–15)
BUN: 27 mg/dL — ABNORMAL HIGH (ref 8–23)
CO2: 28 mmol/L (ref 22–32)
Calcium: 9.2 mg/dL (ref 8.9–10.3)
Chloride: 98 mmol/L (ref 98–111)
Creatinine, Ser: 0.83 mg/dL (ref 0.44–1.00)
GFR, Estimated: >60 mL/min (ref >60)
Glucose, Bld: 120 mg/dL — ABNORMAL HIGH (ref 70–99)
Potassium: 3.6 mmol/L (ref 3.5–5.1)
Sodium: 140 mmol/L (ref 135–145)
Total Bilirubin: 0.2 mg/dL — ABNORMAL LOW (ref 0.3–1.2)
Total Protein: 6.4 g/dL — ABNORMAL LOW (ref 6.5–8.1)

## 2022-06-13 LAB — MAGNESIUM: Magnesium: 2.3 mg/dL (ref 1.7–2.4)

## 2022-06-13 SURGERY — INSERTION, INTRAMEDULLARY ROD, FEMUR, RETROGRADE
Anesthesia: General | Site: Leg Upper | Laterality: Left

## 2022-06-13 MED ORDER — ONDANSETRON HCL 4 MG/2ML IJ SOLN
INTRAMUSCULAR | Status: AC
  Filled 2022-06-13: qty 6

## 2022-06-13 MED ORDER — CEFAZOLIN SODIUM 1 G IJ SOLR
INTRAMUSCULAR | Status: AC
  Filled 2022-06-13: qty 20

## 2022-06-13 MED ORDER — OXYCODONE HCL 5 MG PO TABS
5.0000 mg | ORAL_TABLET | Freq: Once | ORAL | Status: AC | PRN
  Administered 2022-06-13: 5 mg via ORAL

## 2022-06-13 MED ORDER — CHLORHEXIDINE GLUCONATE 0.12 % MT SOLN
OROMUCOSAL | Status: AC
  Administered 2022-06-13: 15 mL via OROMUCOSAL
  Filled 2022-06-13: qty 15

## 2022-06-13 MED ORDER — CHLORHEXIDINE GLUCONATE 0.12 % MT SOLN: 15.0000 mL | Freq: Once | OROMUCOSAL | Status: AC

## 2022-06-13 MED ORDER — ROCURONIUM BROMIDE 10 MG/ML (PF) SYRINGE
PREFILLED_SYRINGE | INTRAVENOUS | Status: AC
  Filled 2022-06-13: qty 10

## 2022-06-13 MED ORDER — PHENYLEPHRINE HCL-NACL 20-0.9 MG/250ML-% IV SOLN
INTRAVENOUS | Status: DC | PRN
  Administered 2022-06-13: 25 ug/min via INTRAVENOUS

## 2022-06-13 MED ORDER — DOCUSATE SODIUM 100 MG PO CAPS
100.0000 mg | ORAL_CAPSULE | Freq: Two times a day (BID) | ORAL | Status: DC
  Administered 2022-06-13 – 2022-06-14 (×2): 100 mg via ORAL
  Filled 2022-06-13 (×2): qty 1

## 2022-06-13 MED ORDER — BISACODYL 10 MG RE SUPP: 10.0000 mg | Freq: Every day | RECTAL | Status: DC | PRN

## 2022-06-13 MED ORDER — METHOCARBAMOL 1000 MG/10ML IJ SOLN: 500.0000 mg | Freq: Four times a day (QID) | INTRAVENOUS | Status: DC | PRN

## 2022-06-13 MED ORDER — 0.9 % SODIUM CHLORIDE (POUR BTL) OPTIME
TOPICAL | Status: DC | PRN
  Administered 2022-06-13: 1000 mL

## 2022-06-13 MED ORDER — INSULIN ASPART 100 UNIT/ML IJ SOLN: 0.0000 [IU] | INTRAMUSCULAR | Status: DC | PRN

## 2022-06-13 MED ORDER — POLYETHYLENE GLYCOL 3350 17 G PO PACK: 17.0000 g | PACK | Freq: Every day | ORAL | Status: DC | PRN

## 2022-06-13 MED ORDER — LIDOCAINE 2% (20 MG/ML) 5 ML SYRINGE
INTRAMUSCULAR | Status: DC | PRN
  Administered 2022-06-13: 60 mg via INTRAVENOUS

## 2022-06-13 MED ORDER — CEFAZOLIN SODIUM-DEXTROSE 2-3 GM-%(50ML) IV SOLR
INTRAVENOUS | Status: DC | PRN
  Administered 2022-06-13: 2 g via INTRAVENOUS

## 2022-06-13 MED ORDER — MAGNESIUM CITRATE PO SOLN: 1.0000 | Freq: Once | ORAL | Status: DC | PRN

## 2022-06-13 MED ORDER — METHOCARBAMOL 500 MG PO TABS
500.0000 mg | ORAL_TABLET | Freq: Four times a day (QID) | ORAL | Status: DC | PRN
  Administered 2022-06-14: 500 mg via ORAL
  Filled 2022-06-13: qty 1

## 2022-06-13 MED ORDER — ROCURONIUM BROMIDE 10 MG/ML (PF) SYRINGE
PREFILLED_SYRINGE | INTRAVENOUS | Status: DC | PRN
  Administered 2022-06-13: 80 mg via INTRAVENOUS

## 2022-06-13 MED ORDER — ONDANSETRON HCL 4 MG PO TABS: 4.0000 mg | ORAL_TABLET | Freq: Four times a day (QID) | ORAL | Status: DC | PRN

## 2022-06-13 MED ORDER — METOCLOPRAMIDE HCL 5 MG PO TABS: 5.0000 mg | ORAL_TABLET | Freq: Three times a day (TID) | ORAL | Status: DC | PRN

## 2022-06-13 MED ORDER — SODIUM CHLORIDE 0.9 % IV SOLN: INTRAVENOUS | Status: DC

## 2022-06-13 MED ORDER — ONDANSETRON HCL 4 MG/2ML IJ SOLN
INTRAMUSCULAR | Status: DC | PRN
  Administered 2022-06-13: 4 mg via INTRAVENOUS

## 2022-06-13 MED ORDER — ORAL CARE MOUTH RINSE: 15.0000 mL | Freq: Once | OROMUCOSAL | Status: AC

## 2022-06-13 MED ORDER — OXYCODONE HCL 5 MG/5ML PO SOLN: 5.0000 mg | Freq: Once | ORAL | Status: AC | PRN

## 2022-06-13 MED ORDER — LACTATED RINGERS IV SOLN: INTRAVENOUS | Status: DC

## 2022-06-13 MED ORDER — HYDROMORPHONE HCL 1 MG/ML IJ SOLN: 0.2500 mg | INTRAMUSCULAR | Status: DC | PRN

## 2022-06-13 MED ORDER — LIDOCAINE 2% (20 MG/ML) 5 ML SYRINGE
INTRAMUSCULAR | Status: AC
  Filled 2022-06-13: qty 15

## 2022-06-13 MED ORDER — MIDAZOLAM HCL 2 MG/2ML IJ SOLN
INTRAMUSCULAR | Status: AC
  Filled 2022-06-13: qty 2

## 2022-06-13 MED ORDER — FENTANYL CITRATE (PF) 250 MCG/5ML IJ SOLN
INTRAMUSCULAR | Status: DC | PRN
  Administered 2022-06-13: 50 ug via INTRAVENOUS
  Administered 2022-06-13: 100 ug via INTRAVENOUS

## 2022-06-13 MED ORDER — AMISULPRIDE (ANTIEMETIC) 5 MG/2ML IV SOLN: 10.0000 mg | Freq: Once | INTRAVENOUS | Status: DC | PRN

## 2022-06-13 MED ORDER — ONDANSETRON HCL 4 MG/2ML IJ SOLN: 4.0000 mg | Freq: Four times a day (QID) | INTRAMUSCULAR | Status: DC | PRN

## 2022-06-13 MED ORDER — PROMETHAZINE HCL 25 MG/ML IJ SOLN: 6.2500 mg | INTRAMUSCULAR | Status: DC | PRN

## 2022-06-13 MED ORDER — PHENYLEPHRINE 80 MCG/ML (10ML) SYRINGE FOR IV PUSH (FOR BLOOD PRESSURE SUPPORT)
PREFILLED_SYRINGE | INTRAVENOUS | Status: AC
  Filled 2022-06-13: qty 10

## 2022-06-13 MED ORDER — METOCLOPRAMIDE HCL 5 MG/ML IJ SOLN: 5.0000 mg | Freq: Three times a day (TID) | INTRAMUSCULAR | Status: DC | PRN

## 2022-06-13 MED ORDER — FENTANYL CITRATE (PF) 250 MCG/5ML IJ SOLN
INTRAMUSCULAR | Status: AC
  Filled 2022-06-13: qty 5

## 2022-06-13 MED ORDER — OXYCODONE HCL 5 MG PO TABS
ORAL_TABLET | ORAL | Status: AC
  Filled 2022-06-13: qty 1

## 2022-06-13 MED ORDER — PROPOFOL 10 MG/ML IV BOLUS
INTRAVENOUS | Status: DC | PRN
  Administered 2022-06-13: 8 mg via INTRAVENOUS

## 2022-06-13 MED ORDER — PHENYLEPHRINE 80 MCG/ML (10ML) SYRINGE FOR IV PUSH (FOR BLOOD PRESSURE SUPPORT)
PREFILLED_SYRINGE | INTRAVENOUS | Status: DC | PRN
  Administered 2022-06-13: 80 ug via INTRAVENOUS

## 2022-06-13 SURGICAL SUPPLY — 39 items
BAG COUNTER SPONGE SURGICOUNT (BAG) ×2 IMPLANT
BIT DRILL CALIBRATED 4.3MMX365 (DRILL) ×1 IMPLANT
BLADE SURG 15 STRL LF DISP TIS (BLADE) ×2 IMPLANT
BLADE SURG 15 STRL SS (BLADE) ×2
BNDG ELASTIC 6X5.8 VLCR STR LF (GAUZE/BANDAGES/DRESSINGS) ×1 IMPLANT
BNDG GAUZE DERMACEA FLUFF 4 (GAUZE/BANDAGES/DRESSINGS) ×1 IMPLANT
COVER BACK TABLE 60X90IN (DRAPES) ×2 IMPLANT
COVER PERINEAL POST (MISCELLANEOUS) ×2 IMPLANT
COVER SURGICAL LIGHT HANDLE (MISCELLANEOUS) ×4 IMPLANT
DRAPE C-ARM 42X72 X-RAY (DRAPES) ×2 IMPLANT
DRAPE STERI IOBAN 125X83 (DRAPES) ×2 IMPLANT
DRILL CALIBRATED 4.3MMX365 (DRILL) ×2
DRSG ADAPTIC 3X8 NADH LF (GAUZE/BANDAGES/DRESSINGS) ×1 IMPLANT
DRSG MEPILEX BORDER 4X4 (GAUZE/BANDAGES/DRESSINGS) ×1 IMPLANT
DRSG MEPILEX BORDER 4X8 (GAUZE/BANDAGES/DRESSINGS) ×1 IMPLANT
ELECT REM PT RETURN 9FT ADLT (ELECTROSURGICAL) ×2
ELECTRODE REM PT RTRN 9FT ADLT (ELECTROSURGICAL) ×2 IMPLANT
EVACUATOR 1/8 PVC DRAIN (DRAIN) IMPLANT
GAUZE SPONGE 4X4 12PLY STRL (GAUZE/BANDAGES/DRESSINGS) ×1 IMPLANT
GLOVE BIOGEL PI IND STRL 9 (GLOVE) ×2 IMPLANT
GLOVE SURG ORTHO 9.0 STRL STRW (GLOVE) ×2 IMPLANT
GOWN STRL REUS W/ TWL XL LVL3 (GOWN DISPOSABLE) ×6 IMPLANT
GOWN STRL REUS W/TWL XL LVL3 (GOWN DISPOSABLE) ×6
GUIDEPIN VERSANAIL DSP 3.2X444 (ORTHOPEDIC DISPOSABLE SUPPLIES) ×1 IMPLANT
GUIDEWIRE BEAD TIP (WIRE) ×1 IMPLANT
KIT BASIN OR (CUSTOM PROCEDURE TRAY) ×2 IMPLANT
KIT TURNOVER KIT B (KITS) ×2 IMPLANT
MANIFOLD NEPTUNE II (INSTRUMENTS) ×2 IMPLANT
NAIL FEM RETRO 12X360 (Nail) ×1 IMPLANT
NS IRRIG 1000ML POUR BTL (IV SOLUTION) ×2 IMPLANT
PACK GENERAL/GYN (CUSTOM PROCEDURE TRAY) ×2 IMPLANT
PAD ARMBOARD 7.5X6 YLW CONV (MISCELLANEOUS) ×4 IMPLANT
SCREW CORT TI DBL LEAD 5X56 (Screw) ×1 IMPLANT
SCREW CORT TI DBL LEAD 5X75 (Screw) ×1 IMPLANT
SCREW CORT TI DBL LEAD 5X80 (Screw) ×1 IMPLANT
STAPLER VISISTAT 35W (STAPLE) IMPLANT
SUT ETHILON 2 0 PSLX (SUTURE) ×2 IMPLANT
SUT VIC AB 2-0 CTB1 (SUTURE) IMPLANT
WATER STERILE IRR 1000ML POUR (IV SOLUTION) ×4 IMPLANT

## 2022-06-13 NOTE — Anesthesia Postprocedure Evaluation (Signed)
Anesthesia Post Note  Patient: [name]  Procedure(s) Performed: INTERNAL FIXATION LEFT FEMUR (Left: Leg Upper)     Patient location during evaluation: PACU Anesthesia Type: General Level of consciousness: awake and alert Pain management: pain level controlled Vital Signs Assessment: post-procedure vital signs reviewed and stable Respiratory status: spontaneous breathing, nonlabored ventilation and respiratory function stable Cardiovascular status: blood pressure returned to baseline and stable Postop Assessment: no apparent nausea or vomiting Anesthetic complications: no   No notable events documented.  Last Vitals:  Vitals:   06/13/22 1330 06/13/22 1358  BP: 138/66 (!) 98/45  Pulse: 71 64  Resp: 20 17  Temp: 36.7 C 37.1 C  SpO2: 97% 100%    Last Pain:  Vitals:   06/13/22 1335  TempSrc:   PainSc: [place]

## 2022-06-13 NOTE — Progress Notes (Signed)
[name]    Progress Note  [name]  OEV:[id number] DOB: 1951/01/02 DOA: 06/11/2022 PCP: Alcus Dad, MD     Brief Narrative:   [name] is an 71 y.o. female past medical history of PAD, right BKA, diabetes mellitus type 2 for lateral wheelchair at [facility] had severe pain and deformity to her left leg after the fall imaging showed femur fracture   Assessment/Plan:   Closed fracture of distal end of left femur, unspecified fracture morphology, initial encounter [facility]) Orthopedic surgery was consulted. X-ray of the femur shows severe angulated comminuted distal left femoral shaft fracture she will require surgical intervention. Risk and benefits discussed with the patient. Patient will benefit from surgical intervention sooner rather than later.  Possible community-acquired pneumonia: X-rays show airspace opacity of the right lower lobe she was having respiratory symptoms of cough started empirically on IV Rocephin and azithromycin she will complete a 5-day course. We will need a repeat CT scan in 3 months.  Paroxysmal atrial fibrillation: Currently in sinus rhythm continue metoprolol.  Depression: Continue duloxetine.  Diabetic neuropathy: Continue gabapentin.  Diabetes mellitus type 2: Blood glucose relatively well controlled with minimal insulin.  Sacral cubitus ulcer stage II present on admission: RN Pressure Injury Documentation: Pressure Injury 05/14/19 Buttocks Right Stage II -  Partial thickness loss of dermis presenting as a shallow open ulcer with a red, pink wound bed without slough. (Active)  05/14/19 2300  Location: Buttocks  Location Orientation: Right  Staging: Stage II -  Partial thickness loss of dermis presenting as a shallow open ulcer with a red, pink wound bed without slough.  Wound Description (Comments):   Present on Admission:      Pressure Injury 06/18/19 Buttocks (Active)  06/18/19 2230  Location:  Buttocks  Location Orientation:   Staging:   Wound Description (Comments):   Present on Admission:      Pressure Injury 06/18/19 Rectum Medial;Right;Left Stage II -  Partial thickness loss of dermis presenting as a shallow open ulcer with a red, pink wound bed without slough. skin peeling off, pink, purple in color (Active)  06/18/19 2230  Location: Rectum  Location Orientation: Medial;Right;Left  Staging: Stage II -  Partial thickness loss of dermis presenting as a shallow open ulcer with a red, pink wound bed without slough.  Wound Description (Comments): skin peeling off, pink, purple in color  Present on Admission: Yes     Pressure Injury 08/12/19 Heel Right Unstageable - Full thickness tissue loss in which the base of the ulcer is covered by slough (yellow, tan, gray, green or brown) and/or eschar (tan, brown or black) in the wound bed. (Active)  08/12/19 2330  Location: Heel  Location Orientation: Right  Staging: Unstageable - Full thickness tissue loss in which the base of the ulcer is covered by slough (yellow, tan, gray, green or brown) and/or eschar (tan, brown or black) in the wound bed.  Wound Description (Comments):   Present on Admission: Yes     Pressure Injury 08/12/19 Heel Left Unstageable - Full thickness tissue loss in which the base of the injury is covered by slough (yellow, tan, gray, green or brown) and/or eschar (tan, brown or black) in the wound bed. (Active)  08/12/19   Location: Heel  Location Orientation: Left  Staging: Unstageable - Full thickness tissue loss in which the base of the injury is covered by slough (yellow, tan, gray, green or brown) and/or eschar (tan, brown or black) in the  wound bed.  Wound Description (Comments):   Present on Admission:      DVT prophylaxis: lovenox Family Communication: None Status is: Inpatient Remains inpatient appropriate because: Cute comminuted femoral femur fracture    Code Status:     Code Status Orders   (From admission, onward)           Start     Ordered   06/11/22 1932  Full code  Continuous        06/11/22 1935           Code Status History     Date Active Date Inactive Code Status Order ID Comments User Context   02/19/2020 1616 02/23/2020 2351 Full Code [id number]  [place], Bevely Palmer, [place] Inpatient   12/22/2019 1132 12/30/2019 0143 DNR [id number]  Vernelle Emerald, MD Inpatient   08/12/2019 1750 08/14/2019 1844 Full Code [id number]  Edmonia Lynch, DO ED   06/18/2019 1100 06/29/2019 1953 Full Code [id number]  Patriciaann Clan, DO ED   05/04/2019 0527 05/22/2019 1827 Full Code [id number]  Lyndee Hensen, DO Inpatient         IV Access:   Peripheral IV   Procedures and diagnostic studies:   CT CHEST W CONTRAST  Result Date: 06/11/2022 CLINICAL DATA:  Lung nodule. EXAM: CT CHEST WITH CONTRAST TECHNIQUE: Multidetector CT imaging of the chest was performed during intravenous contrast administration. RADIATION DOSE REDUCTION: This exam was performed according to the departmental dose-optimization program which includes automated exposure control, adjustment of the mA and/or kV according to patient size and/or use of iterative reconstruction technique. CONTRAST:  51mL OMNIPAQUE IOHEXOL 350 MG/ML SOLN COMPARISON:  Chest x-ray same day FINDINGS: Cardiovascular: Heart is enlarged. There is no pericardial effusion. Aorta is normal in size. There are atherosclerotic calcifications of the aorta and coronary arteries. Mediastinum/Nodes: Hypodense thyroid nodules are present measuring up 2 7 mm. Mildly enlarged precarinal lymph node measures 12 mm. Mildly enlarged subcarinal lymph node measures 11 mm. Mildly enlarged right hilar lymph node measures 10 mm. Small hiatal hernia is present. Visualized esophagus is within normal limits. Lungs/Pleura: Patchy airspace and tree-in-bud opacities are seen in the posteroinferior right upper lobe and minimally in the superior segment of the right lower  lobe. Nodular density is seen in the posterior right lung apex measuring 1 cm image 5/33. There are minimal emphysematous changes in the right lung apex. There is no pleural effusion or pneumothorax. Upper Abdomen: Gallstones are present. Musculoskeletal: No chest wall abnormality. No acute or significant osseous findings. IMPRESSION: 1. Patchy airspace and tree-in-bud opacities in the right upper lobe and superior segment of the right lower lobe worrisome for infection. 2. 1 cm nodular density in the right lung apex. This may be related to infectious/inflammatory process, but neoplasm can not be excluded. Per Fleischner Society Guidelines, consider a non-contrast Chest CT at 3 months, a PET/CT, or tissue sampling. These guidelines do not apply to immunocompromised patients and patients with cancer. Follow up in patients with significant comorbidities as clinically warranted. For lung cancer screening, adhere to Lung-RADS guidelines. Reference: Radiology. 2017; 284(1):228-43. 3. Mild mediastinal and right hilar lymphadenopathy, likely reactive. 4. Cardiomegaly. 5. Cholelithiasis. 6. Small thyroid nodules measuring up to 7 mm. No follow-up imaging is recommended. Reference: J Am Coll Radiol. 2015 Feb;12(2): 143-50 Aortic Atherosclerosis (ICD10-I70.0). Electronically Signed   By: Ronney Asters M.D.   On: 06/11/2022 20:25   DG CHEST PORT 1 VIEW  Result Date: 06/11/2022 CLINICAL DATA:  Fall. EXAM: PORTABLE CHEST  1 VIEW COMPARISON:  June 18, 2019. FINDINGS: Mild cardiomegaly is noted. Left lung is clear. Irregular nodular opacity is noted in right upper lobe. Old proximal left humeral fracture is noted. IMPRESSION: Irregular nodular opacity seen in right upper lobe. CT scan of the chest is recommended to rule out neoplasm. Electronically Signed   By: Lupita Raider M.D.   On: 06/11/2022 18:37   DG Femur Portable Min 2 Views Left  Result Date: 06/11/2022 CLINICAL DATA:  Fall. EXAM: LEFT FEMUR PORTABLE 2  VIEWS COMPARISON:  None Available. FINDINGS: Severely angulated and comminuted fracture is seen involving the distal left femoral shaft. IMPRESSION: Severely angulated and comminuted distal left femoral shaft fracture. Electronically Signed   By: Lupita Raider M.D.   On: 06/11/2022 18:35   DG Pelvis Portable  Result Date: 06/11/2022 CLINICAL DATA:  Fall. EXAM: PORTABLE PELVIS 1-2 VIEWS COMPARISON:  None Available. FINDINGS: There is no evidence of pelvic fracture or diastasis. No pelvic bone lesions are seen. IMPRESSION: Negative. Electronically Signed   By: Lupita Raider M.D.   On: 06/11/2022 18:34     Medical Consultants:   None.   Subjective:    [name] complaining of right leg pain  Objective:    Vitals:   06/12/22 2141 06/13/22 0409 06/13/22 0800 06/13/22 1005  BP: 115/70 (!) 108/59 108/63 100/62  Pulse: 78 73 68 65  Resp:  18 18 18   Temp: 98.6 F (37 C) 97.7 F (36.5 C) 98.3 F (36.8 C) 97.9 F (36.6 C)  TempSrc: Oral  Oral Oral  SpO2: 95% 95% 96%   Weight:    76.2 kg  Height:    5\' 6"  (1.676 m)   SpO2: 96 %   Intake/Output Summary (Last 24 hours) at 06/13/2022 1147 Last data filed at 06/13/2022 0745 Gross per 24 hour  Intake 710 ml  Output 675 ml  Net 35 ml   Filed Weights   06/11/22 1759 06/13/22 1005  Weight: 76.2 kg 76.2 kg    Exam: General exam: In no acute distress. Respiratory system: Good air movement and clear to auscultation. Cardiovascular system: S1 & S2 heard, RRR. No JVD. Gastrointestinal system: Abdomen is nondistended, soft and nontender.  Extremities: No pedal edema. Skin: No rashes, lesions or ulcers  Data Reviewed:    Labs: Basic Metabolic Panel: Recent Labs  Lab 06/11/22 1805 06/13/22 0318  NA 139 140  K 4.0 3.6  CL 101 98  CO2 27 28  GLUCOSE 138* 120*  BUN 25* 27*  CREATININE 0.92 0.83  CALCIUM 9.0 9.2  MG  --  2.3   GFR Estimated Creatinine Clearance: 64.9 mL/min (by C-G formula based on SCr of 0.83  mg/dL). Liver Function Tests: Recent Labs  Lab 06/11/22 1805 06/13/22 0318  AST 28 19  ALT 18 17  ALKPHOS 96 83  BILITOT 0.3 0.2*  PROT 7.7 6.4*  ALBUMIN 3.4* 2.8*   No results for input(s): "LIPASE", "AMYLASE" in the last 168 hours. No results for input(s): "AMMONIA" in the last 168 hours. Coagulation profile No results for input(s): "INR", "PROTIME" in the last 168 hours. COVID-19 Labs  No results for input(s): "DDIMER", "FERRITIN", "LDH", "CRP" in the last 72 hours.  Lab Results  Component Value Date   SARSCOV2NAA NEGATIVE 02/19/2020   SARSCOV2NAA NEGATIVE 12/29/2019   SARSCOV2NAA NEGATIVE 12/22/2019   SARSCOV2NAA NEGATIVE 08/12/2019    CBC: Recent Labs  Lab 06/11/22 1805 06/13/22 0318  WBC 7.4 7.5  NEUTROABS 5.6  --  HGB 12.0 8.8*  HCT 38.9 28.2*  MCV 92.6 89.8  PLT 237 215   Cardiac Enzymes: No results for input(s): "CKTOTAL", "CKMB", "CKMBINDEX", "TROPONINI" in the last 168 hours. BNP (last 3 results) No results for input(s): "PROBNP" in the last 8760 hours. CBG: Recent Labs  Lab 06/13/22 0134 06/13/22 0408 06/13/22 0757 06/13/22 0914 06/13/22 1006  GLUCAP 115* 126* 92 89 90   D-Dimer: No results for input(s): "DDIMER" in the last 72 hours. Hgb A1c: Recent Labs    06/11/22 2135  HGBA1C 5.6   Lipid Profile: No results for input(s): "CHOL", "HDL", "LDLCALC", "TRIG", "CHOLHDL", "LDLDIRECT" in the last 72 hours. Thyroid function studies: No results for input(s): "TSH", "T4TOTAL", "T3FREE", "THYROIDAB" in the last 72 hours.  Invalid input(s): "FREET3" Anemia work up: No results for input(s): "VITAMINB12", "FOLATE", "FERRITIN", "TIBC", "IRON", "RETICCTPCT" in the last 72 hours. Sepsis Labs: Recent Labs  Lab 06/11/22 1805 06/12/22 0259 06/13/22 0318  PROCALCITON  --  <0.10  --   WBC 7.4  --  7.5   Microbiology Recent Results (from the past 240 hour(s))  MRSA Next Gen by PCR, Nasal     Status: None   Collection Time: 06/12/22  2:10 AM    Specimen: Nasal Mucosa; Nasal Swab  Result Value Ref Range Status   MRSA by PCR Next Gen NOT DETECTED NOT DETECTED Final    Comment: (NOTE) The GeneXpert MRSA Assay (FDA approved for NASAL specimens only), is one component of a comprehensive MRSA colonization surveillance program. It is not intended to diagnose MRSA infection nor to guide or monitor treatment for MRSA infections. Test performance is not FDA approved in patients less than 59 years old. Performed at [facility], [place] [address], [place], [place] [address]      Medications:    [MAR Hold] DULoxetine  60 mg Oral Daily   [MAR Hold] feeding supplement  237 mL Oral BID BM   [MAR Hold] gabapentin  600 mg Oral TID   [MAR Hold] insulin aspart  0-9 Units Subcutaneous Q4H   [MAR Hold] latanoprost  1 drop Both Eyes QHS   [MAR Hold] metoprolol tartrate  12.5 mg Oral BID   [MAR Hold] OLANZapine zydis  5 mg Oral QHS   Continuous Infusions:  [MAR Hold] azithromycin 500 mg (06/12/22 2313)   [MAR Hold] cefTRIAXone (ROCEPHIN)  IV 2 g (06/12/22 2206)   lactated ringers        LOS: 2 days   Charlynne Cousins  [facility]  06/13/2022, 11:47 AM

## 2022-06-13 NOTE — Plan of Care (Signed)
  Problem: Education: Goal: Ability to describe self-care measures that may prevent or decrease complications (Diabetes Survival Skills Education) will improve Outcome: Progressing   Problem: Coping: Goal: Ability to adjust to condition or change in health will improve Outcome: Progressing   Problem: Skin Integrity: Goal: Risk for impaired skin integrity will decrease Outcome: Progressing   Problem: Pain Managment: Goal: General experience of comfort will improve Outcome: Progressing   

## 2022-06-13 NOTE — Op Note (Signed)
06/13/2022  1:06 PM  PATIENT:  [name]    PRE-OPERATIVE DIAGNOSIS:  Left Femur Fracture  POST-OPERATIVE DIAGNOSIS:  Same  PROCEDURE:  INTERNAL FIXATION LEFT FEMUR C-arm fluoroscopy to verify reduction.  SURGEON:  Newt Minion, MD  PHYSICIAN ASSISTANT:None ANESTHESIA:   General  PREOPERATIVE INDICATIONS:  [name] is a  71 y.o. female with a diagnosis of Left Femur Fracture who failed conservative measures and elected for surgical management.    The risks benefits and alternatives were discussed with the patient preoperatively including but not limited to the risks of infection, bleeding, nerve injury, cardiopulmonary complications, the need for revision surgery, among others, and the patient was willing to proceed.  OPERATIVE IMPLANTS: 12 x 360 mm Biomet retrograde nail.  @ENCIMAGES @  OPERATIVE FINDINGS: C-arm fluoroscopy verified reduction.  Patient's cortical bone was extremely thin.  OPERATIVE PROCEDURE: Patient was brought the operating room and underwent a general anesthetic.  After adequate levels anesthesia were obtained patient's left lower extremity was prepped using DuraPrep draped into a sterile field a timeout was called.  The triangle was placed beneath the knee.  An incision was made through the patella tendon and guidewire was inserted Phillips Phillips in the femoral shaft this was overreamed with the tissue protector placed.  The guidewire was then inserted and using the finger manipulator the guidewire was advanced across the fracture site.  This was sequentially reamed to 14 mm for 12 mm nail.  The bone was extremely soft and fragile.  The nail was inserted 360 mm.  This was locked distally x2 and the compression screw was used to lock into the screws.  With the extremely thin bone a proximal locking screw was not placed with the concern that this would be a stress riser for a proximal fracture.  There was good rotational stability.  The wounds were irrigated  normal saline incision closed using 2-0 nylon 4 x 4 dressing was applied.  A Mepilex dressing was also applied to the heel to prevent risk of ulceration.  The leg was wrapped in Kerlix and Coban.  Patient was extubated taken the PACU in stable condition.   DISCHARGE PLANNING:  Antibiotic duration: Continue antibiotics as per medical management  Weightbearing: Patient is nonweightbearing on the left lower extremity she is Hoyer lift for transfer training only  Pain medication: Continue current pain medicine  Dressing care/ Wound VAC: Dry dressing reinforce as needed  Ambulatory devices: No attempt with ambulation.  Hoyer lift for transfers only  Discharge to: Discharge back to skilled nursing.  Follow-up: In the office 1 week post operative.

## 2022-06-13 NOTE — Progress Notes (Signed)
Patient alert and oriented x 2 arrived in short stay. Patient able to verbalized her name and date of birth, the place where she is, the procedure that she will have this afternoon with doctor Sharol Given, but patient has periods of confusion. To sign the patient's consent the next of kin Kenton King[place], [place]) was called by this Probation officer and by Dr. Sharol Given with no results. Per Dr. Sharol Given and the floor attending physician, patient will have surgery today, due to medical necessity.

## 2022-06-13 NOTE — Consult Note (Signed)
ORTHOPAEDIC CONSULTATION  REQUESTING PHYSICIAN: Marinda Elk, MD  Chief Complaint: Left thigh pain.  HPI: [name] is a 71 y.o. female who presents with acute left distal femur fracture.  Patient reportedly fell out of her wheelchair.  Patient is full assist at a skilled nursing facility with [facility] lift for transfers.  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  Past Medical History:  Diagnosis Date   Arthritis    Hypoglycemia    Pneumonia, pneumococcal [facility])    Past Surgical History:  Procedure Laterality Date   ABDOMINAL HYSTERECTOMY     AMPUTATION Right 02/19/2020   Procedure: RIGHT BELOW KNEE AMPUTATION;  Surgeon: Nadara Mustard, MD;  Location: [facility] OR;  Service: Orthopedics;  Laterality: Right;   CESAREAN SECTION     Social History   Socioeconomic History   Marital status: Single    Spouse name: Not on file   Number of children: Not on file   Years of education: Not on file   Highest education level: Not on file  Occupational History   Not on file  Tobacco Use   Smoking status: Light Smoker    Packs/day: 0.25    Types: Cigarettes   Smokeless tobacco: Never  Vaping Use   Vaping Use: Never used  Substance and Sexual Activity   Alcohol use: No   Drug use: No   Sexual activity: Not on file  Other Topics Concern   Not on file  Social History Narrative   Not on file   Social Determinants of Health   Financial Resource Strain: Not on file  Food Insecurity: No Food Insecurity (06/13/2022)   Hunger Vital Sign    Worried About Running Out of Food in the Last Year: Never true    Ran Out of Food in the Last Year: Never true  Transportation Needs: No Transportation Needs (06/13/2022)   PRAPARE - Administrator, Civil Service (Medical): No    Lack of Transportation (Non-Medical): No  Physical Activity: Not on file  Stress: Not on file  Social Connections: Not on file    Family History  Family history unknown: Yes   - negative except otherwise stated in the family history section Allergies  Allergen Reactions   Codeine Other (See Comments)    "Gets high"   Tylenol [Acetaminophen] Hives   Prior to Admission medications   Medication Sig Start Date End Date Taking? Authorizing Provider  Amino Acids-Protein Hydrolys (PRO-STAT) LIQD Take 30 mLs by mouth daily.   Yes [provider]  DULoxetine (CYMBALTA) 60 MG capsule Take 60 mg by mouth daily.   Yes [provider]  furosemide (LASIX) 40 MG tablet Take 40 mg by mouth daily. 04/19/22  Yes [provider]  gabapentin (NEURONTIN) 300 MG capsule Take 2 capsules (600 mg total) by mouth 3 (three) times daily. 06/29/19  Yes Myrene Buddy, MD  hydrOXYzine (VISTARIL) 25 MG capsule Take 25 mg by mouth daily. 04/27/22  Yes [provider]  latanoprost (XALATAN) 0.005 % ophthalmic solution Place 1 drop into both eyes at bedtime. 03/21/22  Yes [provider]  metoprolol tartrate (LOPRESSOR) 25 MG tablet Take 0.5 tablets (12.5 mg total) by mouth 2 (two) times daily. 12/29/19  Yes Mujtaba, Mohammadtokir, MD  Multiple Vitamin (MULTIVITAMIN ADULT PO) Take 1 tablet by mouth daily.   Yes [provider]  OLANZapine zydis (ZYPREXA) 5 MG disintegrating tablet Take 5  mg by mouth at bedtime. 04/24/22  Yes [provider]  traMADol (ULTRAM) 50 MG tablet Take 50 mg by mouth every 6 (six) hours as needed for moderate pain. 06/11/22  Yes [provider]  Zinc Oxide (DESITIN EX) Apply 1 Application topically 2 (two) times daily.   Yes [provider]   CT CHEST W CONTRAST  Result Date: 06/11/2022 CLINICAL DATA:  Lung nodule. EXAM: CT CHEST WITH CONTRAST TECHNIQUE: Multidetector CT imaging of the chest was performed during intravenous contrast administration. RADIATION DOSE REDUCTION: This exam was performed according to the departmental dose-optimization  program which includes automated exposure control, adjustment of the mA and/or kV according to patient size and/or use of iterative reconstruction technique. CONTRAST:  82mL OMNIPAQUE IOHEXOL 350 MG/ML SOLN COMPARISON:  Chest x-ray same day FINDINGS: Cardiovascular: Heart is enlarged. There is no pericardial effusion. Aorta is normal in size. There are atherosclerotic calcifications of the aorta and coronary arteries. Mediastinum/Nodes: Hypodense thyroid nodules are present measuring up 2 7 mm. Mildly enlarged precarinal lymph node measures 12 mm. Mildly enlarged subcarinal lymph node measures 11 mm. Mildly enlarged right hilar lymph node measures 10 mm. Small hiatal hernia is present. Visualized esophagus is within normal limits. Lungs/Pleura: Patchy airspace and tree-in-bud opacities are seen in the posteroinferior right upper lobe and minimally in the superior segment of the right lower lobe. Nodular density is seen in the posterior right lung apex measuring 1 cm image 5/33. There are minimal emphysematous changes in the right lung apex. There is no pleural effusion or pneumothorax. Upper Abdomen: Gallstones are present. Musculoskeletal: No chest wall abnormality. No acute or significant osseous findings. IMPRESSION: 1. Patchy airspace and tree-in-bud opacities in the right upper lobe and superior segment of the right lower lobe worrisome for infection. 2. 1 cm nodular density in the right lung apex. This may be related to infectious/inflammatory process, but neoplasm can not be excluded. Per Fleischner Society Guidelines, consider a non-contrast Chest CT at 3 months, a PET/CT, or tissue sampling. These guidelines do not apply to immunocompromised patients and patients with cancer. Follow up in patients with significant comorbidities as clinically warranted. For lung cancer screening, adhere to Lung-RADS guidelines. Reference: Radiology. 2017; 284(1):228-43. 3. Mild mediastinal and right hilar lymphadenopathy,  likely reactive. 4. Cardiomegaly. 5. Cholelithiasis. 6. Small thyroid nodules measuring up to 7 mm. No follow-up imaging is recommended. Reference: J Am Coll Radiol. 2015 Feb;12(2): 143-50 Aortic Atherosclerosis (ICD10-I70.0). Electronically Signed   By: Ronney Asters M.D.   On: 06/11/2022 20:25   DG CHEST PORT 1 VIEW  Result Date: 06/11/2022 CLINICAL DATA:  Fall. EXAM: PORTABLE CHEST 1 VIEW COMPARISON:  June 18, 2019. FINDINGS: Mild cardiomegaly is noted. Left lung is clear. Irregular nodular opacity is noted in right upper lobe. Old proximal left humeral fracture is noted. IMPRESSION: Irregular nodular opacity seen in right upper lobe. CT scan of the chest is recommended to rule out neoplasm. Electronically Signed   By: Marijo Conception M.D.   On: 06/11/2022 18:37   DG Femur Portable Min 2 Views Left  Result Date: 06/11/2022 CLINICAL DATA:  Fall. EXAM: LEFT FEMUR PORTABLE 2 VIEWS COMPARISON:  None Available. FINDINGS: Severely angulated and comminuted fracture is seen involving the distal left femoral shaft. IMPRESSION: Severely angulated and comminuted distal left femoral shaft fracture. Electronically Signed   By: Marijo Conception M.D.   On: 06/11/2022 18:35   DG Pelvis Portable  Result Date: 06/11/2022 CLINICAL DATA:  Fall. EXAM: PORTABLE PELVIS  1-2 VIEWS COMPARISON:  None Available. FINDINGS: There is no evidence of pelvic fracture or diastasis. No pelvic bone lesions are seen. IMPRESSION: Negative. Electronically Signed   By: Lupita Raider M.D.   On: 06/11/2022 18:34   - pertinent xrays, CT, MRI studies were reviewed and independently interpreted  Positive ROS: All other systems have been reviewed and were otherwise negative with the exception of those mentioned in the HPI and as above.  Physical Exam: General: Alert, no acute distress Psychiatric: Patient is competent for consent with normal mood and affect Lymphatic: No axillary or cervical lymphadenopathy Cardiovascular: No pedal  edema Respiratory: No cyanosis, no use of accessory musculature GI: No organomegaly, abdomen is soft and non-tender    Images:  @ENCIMAGES @  Labs:  Lab Results  Component Value Date   HGBA1C 5.6 06/11/2022   HGBA1C 6.1 (H) 12/22/2019   HGBA1C 6.6 (H) 05/04/2019   ESRSEDRATE 60 (H) 12/28/2019   ESRSEDRATE 102 (H) 12/22/2019   ESRSEDRATE 129 (H) 06/18/2019   CRP 1.1 (H) 12/28/2019   CRP 3.9 (H) 12/22/2019   CRP 21.8 (H) 06/18/2019   REPTSTATUS 03/04/2020 FINAL 03/01/2020   GRAMSTAIN NO WBC SEEN NO ORGANISMS SEEN  03/01/2020   CULT  03/01/2020    RARE CORYNEBACTERIUM STRIATUM Standardized susceptibility testing for this organism is not available. Performed at [facility], [address] [place], [place]    LABORGA PSEUDOMONAS AERUGINOSA 12/22/2019   LABORGA PROTEUS MIRABILIS 12/22/2019    Lab Results  Component Value Date   ALBUMIN 2.8 (L) 06/13/2022   ALBUMIN 3.4 (L) 06/11/2022   ALBUMIN 2.9 (L) 12/23/2019   PREALBUMIN 12.5 (L) 12/22/2019        Latest Ref Rng & Units 06/13/2022    3:18 AM 06/11/2022    6:05 PM 02/19/2020    1:17 PM  CBC EXTENDED  WBC 4.0 - 10.5 K/uL 7.5  7.4  4.8   RBC 3.87 - 5.11 MIL/uL 3.14  4.20  3.93   Hemoglobin 12.0 - 15.0 g/dL 8.8  02/21/2020  9.9   HCT 94.1 - 46.0 % 28.2  38.9  34.3   Platelets 150 - 400 K/uL 215  237  295   NEUT# 1.7 - 7.7 K/uL  5.6    Lymph# 0.7 - 4.0 K/uL  1.0      Neurologic: Patient does not have protective sensation bilateral lower extremities.   MUSCULOSKELETAL:   Skin: Examination left lower extremity the skin is intact.  Radiographs show comminuted distal femur fracture on the left with deformity.  Patient has thin cortical walls of the femur.  Hemoglobin 8.8.  Albumin 2.8.  Assessment: Assessment: Unstable comminuted left distal femur fracture.  Plan: We will plan for retrograde intramedullary nail of the left femur.  Risks and benefits were discussed including infection failure of  the hardware.  Patient states she understands wished to proceed at this time.  Patient will need to be nonweightbearing after surgery.  Continue [facility] lift transfers.  Return to skilled nursing.  Thank you for the consult and the opportunity to see Ms. 74.0, MD [facility] [phone] 7:20 AM

## 2022-06-13 NOTE — Transfer of Care (Signed)
Immediate Anesthesia Transfer of Care Note  Patient: [name]  Procedure(s) Performed: INTERNAL FIXATION LEFT FEMUR (Left: Leg Upper)  Patient Location: PACU  Anesthesia Type:General  Level of Consciousness: awake and alert   Airway & Oxygen Therapy: Patient Spontanous Breathing and Patient connected to face mask oxygen  Post-op Assessment: Report given to RN and Post -op Vital signs reviewed and stable  Post vital signs: Reviewed and stable  Last Vitals:  Vitals Value Taken Time  BP 121/59 06/13/22 1310  Temp 36.8 C 06/13/22 1310  Pulse 78 06/13/22 1315  Resp 23 06/13/22 1315  SpO2 97 % 06/13/22 1315  Vitals shown include unvalidated device data.  Last Pain:  Vitals:   06/13/22 1310  TempSrc:   PainSc: 0-No pain         Complications: No notable events documented.

## 2022-06-13 NOTE — Anesthesia Preprocedure Evaluation (Signed)
Anesthesia Evaluation  Patient identified by MRN, date of birth, ID band Patient awake    Reviewed: Allergy & Precautions, NPO status , Patient's Chart, lab work & pertinent test results, reviewed documented beta blocker date and time   Airway Mallampati: II  TM Distance: >3 FB Neck ROM: Full    Dental  (+) Dental Advisory Given, Upper Dentures, Edentulous Lower   Pulmonary Current Smoker and Patient abstained from smoking.,    Pulmonary exam normal breath sounds clear to auscultation       Cardiovascular hypertension, Pt. on home beta blockers Normal cardiovascular exam+ dysrhythmias Atrial Fibrillation  Rhythm:Regular Rate:Normal     Neuro/Psych PSYCHIATRIC DISORDERS Depression Bipolar Disorder negative neurological ROS     GI/Hepatic negative GI ROS, Neg liver ROS,   Endo/Other  diabetes, Type 2  Renal/GU negative Renal ROS     Musculoskeletal  (+) Arthritis , Osteoarthritis,    Abdominal   Peds  Hematology  (+) Blood dyscrasia, anemia ,   Anesthesia Other Findings Day of surgery medications reviewed with the patient.  Reproductive/Obstetrics                             Anesthesia Physical  Anesthesia Plan  ASA: 3  Anesthesia Plan: General   Post-op Pain Management: Gabapentin PO (pre-op)* and Dilaudid IV   Induction: Intravenous  PONV Risk Score and Plan: 2 and Treatment may vary due to age or medical condition, Ondansetron and Midazolam  Airway Management Planned: Oral ETT  Additional Equipment:   Intra-op Plan:   Post-operative Plan: Extubation in OR  Informed Consent: I have reviewed the patients History and Physical, chart, labs and discussed the procedure including the risks, benefits and alternatives for the proposed anesthesia with the patient or authorized representative who has indicated his/her understanding and acceptance.       Plan Discussed with:    Anesthesia Plan Comments:         Anesthesia Quick Evaluation

## 2022-06-13 NOTE — Anesthesia Procedure Notes (Signed)
Procedure Name: Intubation Date/Time: 06/13/2022 12:05 PM  Performed by: Minerva Ends, CRNAPre-anesthesia Checklist: Patient identified, Emergency Drugs available, Suction available and Patient being monitored Patient Re-evaluated:Patient Re-evaluated prior to induction Oxygen Delivery Method: Circle system utilized Preoxygenation: Pre-oxygenation with 100% oxygen Induction Type: IV induction Ventilation: Mask ventilation without difficulty and Oral airway inserted - appropriate to patient size Laryngoscope Size: Mac and 3 Grade View: Grade I Tube type: Oral Tube size: 7.0 mm Number of attempts: 1 Airway Equipment and Method: Stylet and Oral airway Placement Confirmation: ETT inserted through vocal cords under direct vision, positive ETCO2 and breath sounds checked- equal and bilateral Secured at: 22 cm Tube secured with: Tape Dental Injury: Teeth and Oropharynx as per pre-operative assessment

## 2022-06-14 ENCOUNTER — Encounter (HOSPITAL_COMMUNITY): Payer: Self-pay | Admitting: Orthopedic Surgery

## 2022-06-14 DIAGNOSIS — E1159 Type 2 diabetes mellitus with other circulatory complications: Secondary | ICD-10-CM | POA: Diagnosis not present

## 2022-06-14 DIAGNOSIS — S72402A Unspecified fracture of lower end of left femur, initial encounter for closed fracture: Secondary | ICD-10-CM | POA: Diagnosis not present

## 2022-06-14 LAB — GLUCOSE, CAPILLARY
Glucose-Capillary: 115 mg/dL — ABNORMAL HIGH (ref 70–99)
Glucose-Capillary: 102 mg/dL — ABNORMAL HIGH (ref 70–99)
Glucose-Capillary: 110 mg/dL — ABNORMAL HIGH (ref 70–99)
Glucose-Capillary: 159 mg/dL — ABNORMAL HIGH (ref 70–99)

## 2022-06-14 MED ORDER — POLYETHYLENE GLYCOL 3350 17 G PO PACK
17.0000 g | PACK | Freq: Two times a day (BID) | ORAL | Status: DC
  Filled 2022-06-14: qty 1

## 2022-06-14 MED ORDER — TRAMADOL HCL 50 MG PO TABS: 50.0000 mg | ORAL_TABLET | Freq: Four times a day (QID) | ORAL | 0 refills | Status: DC | PRN

## 2022-06-14 MED ORDER — AZITHROMYCIN 500 MG PO TABS: 500.0000 mg | ORAL_TABLET | Freq: Every day | ORAL | 0 refills | Status: AC

## 2022-06-14 MED ORDER — CEFDINIR 300 MG PO CAPS: 300.0000 mg | ORAL_CAPSULE | Freq: Two times a day (BID) | ORAL | 0 refills | Status: AC

## 2022-06-14 MED ORDER — METHOCARBAMOL 500 MG PO TABS: 500.0000 mg | ORAL_TABLET | Freq: Four times a day (QID) | ORAL | 0 refills | Status: AC | PRN

## 2022-06-14 MED ORDER — TRAMADOL HCL 50 MG PO TABS: 50.0000 mg | ORAL_TABLET | Freq: Four times a day (QID) | ORAL | 0 refills | Status: AC | PRN

## 2022-06-14 MED ORDER — METHOCARBAMOL 500 MG PO TABS: 500.0000 mg | ORAL_TABLET | Freq: Four times a day (QID) | ORAL | 0 refills | Status: DC | PRN

## 2022-06-14 NOTE — TOC Transition Note (Signed)
Transition of Care [facility]) - CM/SW Discharge Note   Patient Details  Name: [name] MRN: [id number] Date of Birth: May 26, 1951  [facility] CM/SW Contact:  Joanne Chars, LCSW Phone Number: 06/14/2022, 1:27 PM   Clinical Narrative:   Pt discharging to [facility], room [place] [address].  RN call report to [phone].     Final next level of care: Skilled Nursing Facility Barriers to Discharge: Barriers Resolved   Patient Goals and CMS Choice        Discharge Placement              Patient chooses bed at:  [facility]) Patient to be transferred to facility by: [place] Name of family member notified: Sam, contact on face sheet, left message Patient and family notified of of transfer: 06/14/22  Discharge Plan and Services                                     Social Determinants of Health (SDOH) Interventions Housing Interventions: Intervention Not Indicated Transportation Interventions: Intervention Not Indicated Utilities Interventions: Intervention Not Indicated   Readmission Risk Interventions    12/29/2019    3:00 PM  Readmission Risk Prevention Plan  Transportation Screening Complete  PCP or Specialist Appt within 3-5 Days Complete  HRI or Home Care Consult Complete  Social Work Consult for [place] Planning/Counseling Complete  Palliative Care Screening Not Applicable  Medication Review Press photographer) Complete

## 2022-06-14 NOTE — Discharge Summary (Signed)
Physician Discharge Summary  [name] [id number] DOB: Oct 24, 1950 DOA: 06/11/2022  PCP: Alcus Dad, MD  Admit date: 06/11/2022 Discharge date: 06/14/2022  Admitted From: Home Disposition:  SNF  Recommendations for Outpatient Follow-up:  Follow up with PCP in 1-2 weeks, repeat CT scan in 3 months to evaluate hilar adenopathy. Please obtain BMP/CBC in one week Follow-up with orthopedic surgery as an outpatient.  Home Health:No  Equipment/Devices:none  Discharge Condition:Stable CODE STATUS:Full Diet recommendation: Heart Healthy    Brief/Interim Summary: 71 y.o. female past medical history of PAD, right BKA, diabetes mellitus type 2 for lateral wheelchair at [facility] had severe pain and deformity to her left leg after the fall imaging showed femur fracture  Discharge Diagnoses:  Principal Problem:   Closed fracture of distal end of left femur, unspecified fracture morphology, initial encounter ([place]) Active Problems:   Lung nodule seen on imaging study   Atrial fibrillation ([place])   Controlled type 2 diabetes mellitus with circulatory disorder, without long-term current use of insulin (HCC)  Closed fracture of the distal end of the left femur: Orthopedic surgery was consulted as x-ray shows severe angulation with comminuted left distal femoral fracture of the shaft she status post surgical intervention. Physical therapy evaluated the patient she will need rehab.  Possible community-acquired pneumonia: She was started empirically on Rocephin and azithromycin to complete a 7-day course of antibiotics as an outpatient. We will need a repeat CT scan in 3 months.  Paroxysmal atrial fibrillation: Sinus rhythm not on anticoagulation.  Depression: Continue duloxetine.  Diabetic neuropathy: Continue gabapentin.  Diabetes mellitus type 2: Continue diet control.  Sacral decubitus ulcer stage II present on admission:  Discharge Instructions  Discharge  Instructions     Diet - low sodium heart healthy   Complete by: As directed    Discharge wound care:   Complete by: As directed    Per wound care instructions   Increase activity slowly   Complete by: As directed    Non weight bearing   Complete by: As directed    Laterality: left   Extremity: Lower   Non weight bearing   Complete by: As directed    Laterality: left   Extremity: Lower      Allergies as of 06/14/2022       Reactions   Codeine Other (See Comments)   "Gets high"   Tylenol [acetaminophen] Hives        Medication List     TAKE these medications    azithromycin 500 MG tablet Commonly known as: Zithromax Take 1 tablet (500 mg total) by mouth daily for 3 days. Take 1 tablet daily for 3 days.   cefdinir 300 MG capsule Commonly known as: OMNICEF Take 1 capsule (300 mg total) by mouth 2 (two) times daily for 3 days.   DESITIN EX Apply 1 Application topically 2 (two) times daily.   DULoxetine 60 MG capsule Commonly known as: CYMBALTA Take 60 mg by mouth daily.   furosemide 40 MG tablet Commonly known as: LASIX Take 40 mg by mouth daily.   gabapentin 300 MG capsule Commonly known as: NEURONTIN Take 2 capsules (600 mg total) by mouth 3 (three) times daily.   hydrOXYzine 25 MG capsule Commonly known as: VISTARIL Take 25 mg by mouth daily.   latanoprost 0.005 % ophthalmic solution Commonly known as: XALATAN Place 1 drop into both eyes at bedtime.   methocarbamol 500 MG tablet Commonly known as: ROBAXIN Take 1 tablet (500 mg total) by  mouth every 6 (six) hours as needed for muscle spasms.   metoprolol tartrate 25 MG tablet Commonly known as: LOPRESSOR Take 0.5 tablets (12.5 mg total) by mouth 2 (two) times daily.   MULTIVITAMIN ADULT PO Take 1 tablet by mouth daily.   OLANZapine zydis 5 MG disintegrating tablet Commonly known as: ZYPREXA Take 5 mg by mouth at bedtime.   Pro-Stat Liqd Take 30 mLs by mouth daily.   traMADol 50 MG  tablet Commonly known as: ULTRAM Take 1 tablet (50 mg total) by mouth every 6 (six) hours as needed for moderate pain.               Discharge Care Instructions  (From admission, onward)           Start     Ordered   06/14/22 0000  Non weight bearing       Question Answer Comment  Laterality left   Extremity Lower      06/14/22 0733   06/14/22 0000  Discharge wound care:       Comments: Per wound care instructions   06/14/22 1000   06/13/22 0000  Non weight bearing       Question Answer Comment  Laterality left   Extremity Lower      06/13/22 1301            Follow-up Information     Newt Minion, MD Follow up in 1 week(s).   Specialty: Orthopedic Surgery Contact information: [place] [address] [phone]                Allergies  Allergen Reactions   Codeine Other (See Comments)    "Gets high"   Tylenol [Acetaminophen] Hives    Consultations: Orthopedic surgery   Procedures/Studies: DG C-Arm 1-60 Min-No Report  Result Date: 06/13/2022 Fluoroscopy was utilized by the requesting physician.  No radiographic interpretation.   CT CHEST W CONTRAST  Result Date: 06/11/2022 CLINICAL DATA:  Lung nodule. EXAM: CT CHEST WITH CONTRAST TECHNIQUE: Multidetector CT imaging of the chest was performed during intravenous contrast administration. RADIATION DOSE REDUCTION: This exam was performed according to the departmental dose-optimization program which includes automated exposure control, adjustment of the mA and/or kV according to patient size and/or use of iterative reconstruction technique. CONTRAST:  27mL OMNIPAQUE IOHEXOL 350 MG/ML SOLN COMPARISON:  Chest x-ray same day FINDINGS: Cardiovascular: Heart is enlarged. There is no pericardial effusion. Aorta is normal in size. There are atherosclerotic calcifications of the aorta and coronary arteries. Mediastinum/Nodes: Hypodense thyroid nodules are present measuring up 2 7 mm.  Mildly enlarged precarinal lymph node measures 12 mm. Mildly enlarged subcarinal lymph node measures 11 mm. Mildly enlarged right hilar lymph node measures 10 mm. Small hiatal hernia is present. Visualized esophagus is within normal limits. Lungs/Pleura: Patchy airspace and tree-in-bud opacities are seen in the posteroinferior right upper lobe and minimally in the superior segment of the right lower lobe. Nodular density is seen in the posterior right lung apex measuring 1 cm image 5/33. There are minimal emphysematous changes in the right lung apex. There is no pleural effusion or pneumothorax. Upper Abdomen: Gallstones are present. Musculoskeletal: No chest wall abnormality. No acute or significant osseous findings. IMPRESSION: 1. Patchy airspace and tree-in-bud opacities in the right upper lobe and superior segment of the right lower lobe worrisome for infection. 2. 1 cm nodular density in the right lung apex. This may be related to infectious/inflammatory process, but neoplasm can not be excluded.  Per Fleischner Society Guidelines, consider a non-contrast Chest CT at 3 months, a PET/CT, or tissue sampling. These guidelines do not apply to immunocompromised patients and patients with cancer. Follow up in patients with significant comorbidities as clinically warranted. For lung cancer screening, adhere to Lung-RADS guidelines. Reference: Radiology. 2017; 284(1):228-43. 3. Mild mediastinal and right hilar lymphadenopathy, likely reactive. 4. Cardiomegaly. 5. Cholelithiasis. 6. Small thyroid nodules measuring up to 7 mm. No follow-up imaging is recommended. Reference: J Am Coll Radiol. 2015 Feb;12(2): 143-50 Aortic Atherosclerosis (ICD10-I70.0). Electronically Signed   By: Ronney Asters M.D.   On: 06/11/2022 20:25   DG CHEST PORT 1 VIEW  Result Date: 06/11/2022 CLINICAL DATA:  Fall. EXAM: PORTABLE CHEST 1 VIEW COMPARISON:  June 18, 2019. FINDINGS: Mild cardiomegaly is noted. Left lung is clear. Irregular  nodular opacity is noted in right upper lobe. Old proximal left humeral fracture is noted. IMPRESSION: Irregular nodular opacity seen in right upper lobe. CT scan of the chest is recommended to rule out neoplasm. Electronically Signed   By: Marijo Conception M.D.   On: 06/11/2022 18:37   DG Femur Portable Min 2 Views Left  Result Date: 06/11/2022 CLINICAL DATA:  Fall. EXAM: LEFT FEMUR PORTABLE 2 VIEWS COMPARISON:  None Available. FINDINGS: Severely angulated and comminuted fracture is seen involving the distal left femoral shaft. IMPRESSION: Severely angulated and comminuted distal left femoral shaft fracture. Electronically Signed   By: Marijo Conception M.D.   On: 06/11/2022 18:35   DG Pelvis Portable  Result Date: 06/11/2022 CLINICAL DATA:  Fall. EXAM: PORTABLE PELVIS 1-2 VIEWS COMPARISON:  None Available. FINDINGS: There is no evidence of pelvic fracture or diastasis. No pelvic bone lesions are seen. IMPRESSION: Negative. Electronically Signed   By: Marijo Conception M.D.   On: 06/11/2022 18:34   (Echo, Carotid, EGD, Colonoscopy, ERCP)    Subjective: No complaints  Discharge Exam: Vitals:   06/14/22 0300 06/14/22 0818  BP: (!) 142/62 (!) 133/49  Pulse: 89 85  Resp: 18 18  Temp: 97.8 F (36.6 C) 99.7 F (37.6 C)  SpO2: 96% (!) 87%   Vitals:   06/13/22 1358 06/13/22 2115 06/14/22 0300 06/14/22 0818  BP: (!) 98/45 (!) 149/68 (!) 142/62 (!) 133/49  Pulse: 64 91 89 85  Resp: 17 18 18 18   Temp: 98.8 F (37.1 C) 97.7 F (36.5 C) 97.8 F (36.6 C) 99.7 F (37.6 C)  TempSrc:      SpO2: 100% 95% 96% (!) 87%  Weight:      Height:        General: Pt is alert, awake, not in acute distress Cardiovascular: RRR, S1/S2 +, no rubs, no gallops Respiratory: CTA bilaterally, no wheezing, no rhonchi Abdominal: Soft, NT, ND, bowel sounds + Extremities: no edema, no cyanosis    The results of significant diagnostics from this hospitalization (including imaging, microbiology, ancillary and  laboratory) are listed below for reference.     Microbiology: Recent Results (from the past 240 hour(s))  MRSA Next Gen by PCR, Nasal     Status: None   Collection Time: 06/12/22  2:10 AM   Specimen: Nasal Mucosa; Nasal Swab  Result Value Ref Range Status   MRSA by PCR Next Gen NOT DETECTED NOT DETECTED Final    Comment: (NOTE) The GeneXpert MRSA Assay (FDA approved for NASAL specimens only), is one component of a comprehensive MRSA colonization surveillance program. It is not intended to diagnose MRSA infection nor to guide or monitor treatment for  MRSA infections. Test performance is not FDA approved in patients less than 27 years old. Performed at [facility], [place] [address], [place], [place] [address]      Labs: BNP (last 3 results) No results for input(s): "BNP" in the last 8760 hours. Basic Metabolic Panel: Recent Labs  Lab 06/11/22 1805 06/13/22 0318  NA 139 140  K 4.0 3.6  CL 101 98  CO2 27 28  GLUCOSE 138* 120*  BUN 25* 27*  CREATININE 0.92 0.83  CALCIUM 9.0 9.2  MG  --  2.3   Liver Function Tests: Recent Labs  Lab 06/11/22 1805 06/13/22 0318  AST 28 19  ALT 18 17  ALKPHOS 96 83  BILITOT 0.3 0.2*  PROT 7.7 6.4*  ALBUMIN 3.4* 2.8*   No results for input(s): "LIPASE", "AMYLASE" in the last 168 hours. No results for input(s): "AMMONIA" in the last 168 hours. CBC: Recent Labs  Lab 06/11/22 1805 06/13/22 0318  WBC 7.4 7.5  NEUTROABS 5.6  --   HGB 12.0 8.8*  HCT 38.9 28.2*  MCV 92.6 89.8  PLT 237 215   Cardiac Enzymes: No results for input(s): "CKTOTAL", "CKMB", "CKMBINDEX", "TROPONINI" in the last 168 hours. BNP: Invalid input(s): "POCBNP" CBG: Recent Labs  Lab 06/13/22 1806 06/13/22 2110 06/13/22 2340 06/14/22 0423 06/14/22 0820  GLUCAP 129* 114* 103* 115* 102*   D-Dimer No results for input(s): "DDIMER" in the last 72 hours. Hgb A1c Recent Labs    06/11/22 2135  HGBA1C 5.6   Lipid Profile No results for input(s):  "CHOL", "HDL", "LDLCALC", "TRIG", "CHOLHDL", "LDLDIRECT" in the last 72 hours. Thyroid function studies No results for input(s): "TSH", "T4TOTAL", "T3FREE", "THYROIDAB" in the last 72 hours.  Invalid input(s): "FREET3" Anemia work up No results for input(s): "VITAMINB12", "FOLATE", "FERRITIN", "TIBC", "IRON", "RETICCTPCT" in the last 72 hours. Urinalysis    Component Value Date/Time   COLORURINE YELLOW 05/03/2019 2333   APPEARANCEUR TURBID (A) 05/03/2019 2333   LABSPEC 1.031 (H) 05/03/2019 2333   PHURINE 5.0 05/03/2019 2333   GLUCOSEU NEGATIVE 05/03/2019 2333   HGBUR NEGATIVE 05/03/2019 2333   BILIRUBINUR NEGATIVE 05/03/2019 2333   KETONESUR NEGATIVE 05/03/2019 2333   PROTEINUR 100 (A) 05/03/2019 2333   NITRITE POSITIVE (A) 05/03/2019 2333   LEUKOCYTESUR TRACE (A) 05/03/2019 2333   Sepsis Labs Recent Labs  Lab 06/11/22 1805 06/13/22 0318  WBC 7.4 7.5   Microbiology Recent Results (from the past 240 hour(s))  MRSA Next Gen by PCR, Nasal     Status: None   Collection Time: 06/12/22  2:10 AM   Specimen: Nasal Mucosa; Nasal Swab  Result Value Ref Range Status   MRSA by PCR Next Gen NOT DETECTED NOT DETECTED Final    Comment: (NOTE) The GeneXpert MRSA Assay (FDA approved for NASAL specimens only), is one component of a comprehensive MRSA colonization surveillance program. It is not intended to diagnose MRSA infection nor to guide or monitor treatment for MRSA infections. Test performance is not FDA approved in patients less than 23 years old. Performed at [facility], [place] [address] [place], [place] [address]      Time coordinating discharge: Over 30 minutes  SIGNED:   Charlynne Cousins, MD  [facility] 06/14/2022, 10:00 AM Pager   If 7PM-7AM, please contact night-coverage [email or web address] Password [email or web address]

## 2022-06-14 NOTE — Plan of Care (Signed)
Patient has met goal from surgeons stand point. At baseline for mobility as she is hoyer lift dependent. Will return to [facility] when [facility] is available for transport Problem: Education: Goal: Ability to describe self-care measures that may prevent or decrease complications (Diabetes Survival Skills Education) will improve Outcome: Adequate for Discharge Goal: Individualized Educational Video(s) Outcome: Adequate for Discharge   Problem: Coping: Goal: Ability to adjust to condition or change in health will improve Outcome: Adequate for Discharge   Problem: Fluid Volume: Goal: Ability to maintain a balanced intake and output will improve Outcome: Adequate for Discharge   Problem: Health Behavior/Discharge Planning: Goal: Ability to identify and utilize available resources and services will improve Outcome: Adequate for Discharge Goal: Ability to manage health-related needs will improve Outcome: Adequate for Discharge   Problem: Metabolic: Goal: Ability to maintain appropriate glucose levels will improve Outcome: Adequate for Discharge   Problem: Nutritional: Goal: Maintenance of adequate nutrition will improve Outcome: Adequate for Discharge Goal: Progress toward achieving an optimal weight will improve Outcome: Adequate for Discharge   Problem: Skin Integrity: Goal: Risk for impaired skin integrity will decrease Outcome: Adequate for Discharge   Problem: Tissue Perfusion: Goal: Adequacy of tissue perfusion will improve Outcome: Adequate for Discharge   Problem: Education: Goal: Knowledge of General Education information will improve Description: Including pain rating scale, medication(s)/side effects and non-pharmacologic comfort measures Outcome: Adequate for Discharge   Problem: Health Behavior/Discharge Planning: Goal: Ability to manage health-related needs will improve Outcome: Adequate for Discharge   Problem: Clinical Measurements: Goal: Ability to maintain  clinical measurements within normal limits will improve Outcome: Adequate for Discharge Goal: Will remain free from infection Outcome: Adequate for Discharge Goal: Diagnostic test results will improve Outcome: Adequate for Discharge Goal: Respiratory complications will improve Outcome: Adequate for Discharge Goal: Cardiovascular complication will be avoided Outcome: Adequate for Discharge   Problem: Activity: Goal: Risk for activity intolerance will decrease Outcome: Adequate for Discharge   Problem: Nutrition: Goal: Adequate nutrition will be maintained Outcome: Adequate for Discharge   Problem: Coping: Goal: Level of anxiety will decrease Outcome: Adequate for Discharge   Problem: Elimination: Goal: Will not experience complications related to bowel motility Outcome: Adequate for Discharge Goal: Will not experience complications related to urinary retention Outcome: Adequate for Discharge   Problem: Pain Managment: Goal: General experience of comfort will improve Outcome: Adequate for Discharge   Problem: Safety: Goal: Ability to remain free from injury will improve Outcome: Adequate for Discharge   Problem: Skin Integrity: Goal: Risk for impaired skin integrity will decrease Outcome: Adequate for Discharge

## 2022-06-14 NOTE — Progress Notes (Signed)
Patient ID: [name], female   DOB: 1951-07-31, 71 y.o.   MRN: [id number] Patient is postoperative day 1 intramedullary nailing left femur fracture.  Patient's bone was extremely osteoporotic.  Patient will be transfers only with a Hoyer lift no weightbearing on the left lower extremity.  Patient may discharge back to skilled nursing.

## 2022-06-14 NOTE — NC FL2 (Signed)
[place] MEDICAID FL2 LEVEL OF CARE SCREENING TOOL     IDENTIFICATION  Patient Name: [name] Birthdate: June 09, 1951 Sex: female Admission Date (Current Location): 06/11/2022  [facility] and [place] Number:  Producer, television/film/video and Address:  [facility] [place], [address], [place], [place] [address]      Provider Number: [id number]  Attending Physician Name and Address:  David Stall, Darin Engels, MD  Relative Name and Phone Number:  Wynona Luna [phone]  [phone]    Current Level of Care: Hospital Recommended Level of Care: Skilled Nursing Facility Prior Approval Number:    Date Approved/Denied:   PASRR Number: [id number]  Discharge Plan: SNF    Current Diagnoses: Patient Active Problem List   Diagnosis Date Noted   Closed fracture of distal end of left femur, unspecified fracture morphology, initial encounter (HCC) 06/11/2022   Lung nodule seen on imaging study 06/11/2022   Acute hematogenous osteomyelitis of right foot (HCC) 02/19/2020   Pseudomonas aeruginosa infection 12/24/2019   Controlled type 2 diabetes mellitus with circulatory disorder, without long-term current use of insulin (HCC) 12/22/2019   Osteomyelitis of right fibula (HCC) 12/22/2019   Severe protein-calorie malnutrition (HCC)    Venous stasis ulcers of both lower extremities (HCC)    Bilateral cellulitis of lower leg 08/12/2019   Depression, major, recurrent, mild (HCC) 06/21/2019   Pressure injury of skin 06/19/2019   Elevated C-reactive protein (CRP)    Leg ulcer (HCC)    Adjustment disorder with mixed disturbance of emotions and conduct    Type 2 diabetes mellitus with hyperglycemia (HCC) 05/04/2019   Infected ulcer of skin (HCC)    High risk social situation    Post-polio syndrome    Chronic pain syndrome    Limited mobility 04/20/2019   Venous stasis ulcer (HCC) 03/31/2019   Overgrown toenails 10/28/2018   Wound of right leg 02/06/2018   Atrial  fibrillation (HCC) 01/03/2018   Anemia 01/03/2018   Elevated serum creatinine 01/03/2018   Bipolar I disorder with mania (HCC) 01/03/2018    Orientation RESPIRATION BLADDER Height & Weight     Self, Time, Situation, Place  Normal Incontinent Weight: 167 lb 15.9 oz (76.2 kg) Height:  5\' 6"  (167.6 cm)  BEHAVIORAL SYMPTOMS/MOOD NEUROLOGICAL BOWEL NUTRITION STATUS      Continent Diet (see discharge summary)  AMBULATORY STATUS COMMUNICATION OF NEEDS Skin   Total Care Verbally Surgical wounds                       Personal Care Assistance Level of Assistance  Total care, Bathing, Feeding, Dressing Bathing Assistance: Maximum assistance Feeding assistance: Limited assistance Dressing Assistance: Maximum assistance Total Care Assistance: Maximum assistance   Functional Limitations Info  Sight, Hearing, Speech Sight Info: Adequate Hearing Info: Adequate Speech Info: Adequate    SPECIAL CARE FACTORS FREQUENCY                       Contractures Contractures Info: Not present    Additional Factors Info  Code Status, Allergies, Insulin Sliding Scale Code Status Info: full Allergies Info: Codeine, Tylenol (Acetaminophen)   Insulin Sliding Scale Info: Novolog: see discharge summary       Current Medications (06/14/2022):  This is the current hospital active medication list Current Facility-Administered Medications  Medication Dose Route Frequency Provider Last Rate Last Admin   0.9 %  sodium chloride infusion   Intravenous Continuous 06/16/2022, MD 10 mL/hr  at 06/13/22 1639 New Bag at 06/13/22 1639   azithromycin (ZITHROMAX) 500 mg in sodium chloride 0.9 % 250 mL IVPB  500 mg Intravenous Q24H Newt Minion, MD   Stopping previously hung infusion at 06/14/22 0819   bisacodyl (DULCOLAX) suppository 10 mg  10 mg Rectal Daily PRN Newt Minion, MD       cefTRIAXone (ROCEPHIN) 2 g in sodium chloride 0.9 % 100 mL IVPB  2 g Intravenous Q24H Newt Minion, MD   Stopping  previously hung infusion at 06/14/22 0819   docusate sodium (COLACE) capsule 100 mg  100 mg Oral BID Newt Minion, MD   100 mg at 06/13/22 2300   DULoxetine (CYMBALTA) DR capsule 60 mg  60 mg Oral Daily Newt Minion, MD   60 mg at 06/13/22 0911   feeding supplement (ENSURE ENLIVE / ENSURE PLUS) liquid 237 mL  237 mL Oral BID BM Newt Minion, MD       gabapentin (NEURONTIN) capsule 600 mg  600 mg Oral TID Newt Minion, MD   600 mg at 06/13/22 2300   HYDROmorphone (DILAUDID) injection 0.5-1 mg  0.5-1 mg Intravenous Q2H PRN Newt Minion, MD   1 mg at 06/13/22 1613   insulin aspart (novoLOG) injection 0-9 Units  0-9 Units Subcutaneous Q4H Newt Minion, MD   1 Units at 06/13/22 1600   latanoprost (XALATAN) 0.005 % ophthalmic solution 1 drop  1 drop Both Eyes QHS Newt Minion, MD   1 drop at 06/12/22 2152   magnesium citrate solution 1 Bottle  1 Bottle Oral Once PRN Newt Minion, MD       methocarbamol (ROBAXIN) tablet 500 mg  500 mg Oral Q6H PRN Newt Minion, MD       Or   methocarbamol (ROBAXIN) 500 mg in dextrose 5 % 50 mL IVPB  500 mg Intravenous Q6H PRN Newt Minion, MD       metoCLOPramide (REGLAN) tablet 5-10 mg  5-10 mg Oral Q8H PRN Newt Minion, MD       Or   metoCLOPramide (REGLAN) injection 5-10 mg  5-10 mg Intravenous Q8H PRN Newt Minion, MD       metoprolol tartrate (LOPRESSOR) tablet 12.5 mg  12.5 mg Oral BID Newt Minion, MD   12.5 mg at 06/13/22 2300   OLANZapine zydis (ZYPREXA) disintegrating tablet 5 mg  5 mg Oral QHS Newt Minion, MD   5 mg at 06/13/22 2300   ondansetron (ZOFRAN) tablet 4 mg  4 mg Oral Q6H PRN Newt Minion, MD       Or   ondansetron [facility]) injection 4 mg  4 mg Intravenous Q6H PRN Newt Minion, MD       polyethylene glycol (MIRALAX / GLYCOLAX) packet 17 g  17 g Oral Daily PRN Newt Minion, MD       polyethylene glycol (MIRALAX / GLYCOLAX) packet 17 g  17 g Oral BID Charlynne Cousins, MD         Discharge  Medications: Please see discharge summary for a list of discharge medications.  Relevant Imaging Results:  Relevant Lab Results:   Additional Information ZOX:[id number]  Joanne Chars, LCSW

## 2022-06-14 NOTE — Plan of Care (Signed)

## 2022-06-14 NOTE — Progress Notes (Signed)
Mobility Specialist Progress Note   06/14/22 1046  Mobility  Activity Turned to back - supine;Moved into chair position in bed  Level of Assistance Moderate assist, patient does 50-74%  Assistive Device Other (Comment) (HHA)  LLE Weight Bearing NWB  Activity Response Tolerated fair  $Mobility charge 1 Mobility   Pt deferring transfer to chair d/t pain in LLE but agreeable to bed level exercises. Very little tolerance to palpations on LLE but pt able to perform exercises on R limb and UE's. Mod cues for sequencing given throughout d/t slight disorientation but overall following general commands. Placed and repositioned in chair position w/ call bell in reach and bed alarm on.     Holland Falling Mobility Specialist [place] #:  [phone] Acute Rehab Office:  [phone]

## 2022-07-02 ENCOUNTER — Ambulatory Visit (INDEPENDENT_AMBULATORY_CARE_PROVIDER_SITE_OTHER): Payer: Medicare Other

## 2022-07-02 ENCOUNTER — Ambulatory Visit (INDEPENDENT_AMBULATORY_CARE_PROVIDER_SITE_OTHER): Payer: Medicare Other | Admitting: Orthopedic Surgery

## 2022-07-02 ENCOUNTER — Encounter: Payer: Self-pay | Admitting: Orthopedic Surgery

## 2022-07-02 DIAGNOSIS — S72452A Displaced supracondylar fracture without intracondylar extension of lower end of left femur, initial encounter for closed fracture: Secondary | ICD-10-CM | POA: Diagnosis not present

## 2022-07-02 DIAGNOSIS — S72451A Displaced supracondylar fracture without intracondylar extension of lower end of right femur, initial encounter for closed fracture: Secondary | ICD-10-CM

## 2022-07-02 NOTE — Progress Notes (Signed)
Office Visit Note   Patient: [name]           Date of Birth: 1951-08-22           MRN: [id number] Visit Date: 07/02/2022              Requested by: Alcus Dad, MD [address] [place],  [place] [address] PCP: Alcus Dad, MD  Chief Complaint  Patient presents with   Left Leg - Routine Post Op    06/13/22 internal fixation left femur      HPI: Patient is a 71 year old woman who is 3 weeks status post intramedullary nail fixation for a distal femur fracture.  Patient states she may have some headaches from her pain medicine.  Patient complains of some pain in her left lower leg.  Assessment & Plan: Visit Diagnoses:  1. Displaced supracondylar fracture without intracondylar extension of lower end of right femur, initial encounter for closed fracture [facility])     Plan: Patient will continue strict nonweightbearing on the left lower extremity sutures harvested today.  Follow-Up Instructions: Return in about 4 weeks (around 07/30/2022).   Ortho Exam  Patient is alert, oriented, no adenopathy, well-dressed, normal affect, normal respiratory effort. Examination patient has a cold left foot.  She has a palpable dorsalis pedis pulse.  There is no redness or cellulitis in the left leg.  The surgical incision around the knee is healing well no signs of infection.  Sutures are harvested.  Her right transtibial amputation is stable.  Imaging: XR FEMUR MIN 2 VIEWS LEFT  Result Date: 07/02/2022 2 view radiographs of the left femur show stable intramedullary nail fixation with collapse across the fracture site without callus formation.  No images are attached to the encounter.  Labs: Lab Results  Component Value Date   HGBA1C 5.6 06/11/2022   HGBA1C 6.1 (H) 12/22/2019   HGBA1C 6.6 (H) 05/04/2019   ESRSEDRATE 60 (H) 12/28/2019   ESRSEDRATE 102 (H) 12/22/2019   ESRSEDRATE 129 (H) 06/18/2019   CRP 1.1 (H) 12/28/2019   CRP 3.9 (H) 12/22/2019   CRP 21.8 (H) 06/18/2019    REPTSTATUS 03/04/2020 FINAL 03/01/2020   GRAMSTAIN NO WBC SEEN NO ORGANISMS SEEN  03/01/2020   CULT  03/01/2020    RARE CORYNEBACTERIUM STRIATUM Standardized susceptibility testing for this organism is not available. Performed at [facility], [place] [address], [place], [place] [address]    LABORGA PSEUDOMONAS AERUGINOSA 12/22/2019   LABORGA PROTEUS MIRABILIS 12/22/2019     Lab Results  Component Value Date   ALBUMIN 2.8 (L) 06/13/2022   ALBUMIN 3.4 (L) 06/11/2022   ALBUMIN 2.9 (L) 12/23/2019   PREALBUMIN 12.5 (L) 12/22/2019    Lab Results  Component Value Date   MG 2.3 06/13/2022   MG 1.9 06/19/2019   No results found for: "VD25OH"  Lab Results  Component Value Date   PREALBUMIN 12.5 (L) 12/22/2019      Latest Ref Rng & Units 06/13/2022    3:18 AM 06/11/2022    6:05 PM 02/19/2020    1:17 PM  CBC EXTENDED  WBC 4.0 - 10.5 K/uL 7.5  7.4  4.8   RBC 3.87 - 5.11 MIL/uL 3.14  4.20  3.93   Hemoglobin 12.0 - 15.0 g/dL 8.8  12.0  9.9   HCT 36.0 - 46.0 % 28.2  38.9  34.3   Platelets 150 - 400 K/uL 215  237  295   NEUT# 1.7 - 7.7 K/uL  5.6  Lymph# 0.7 - 4.0 K/uL  1.0       There is no height or weight on file to calculate BMI.  Orders:  Orders Placed This Encounter  Procedures   XR FEMUR MIN 2 VIEWS LEFT   No orders of the defined types were placed in this encounter.    Procedures: No procedures performed  Clinical Data: No additional findings.  ROS:  All other systems negative, except as noted in the HPI. Review of Systems  Objective: Vital Signs: There were no vitals taken for this visit.  Specialty Comments:  No specialty comments available.  PMFS History: Patient Active Problem List   Diagnosis Date Noted   Closed fracture of distal end of left femur, unspecified fracture morphology, initial encounter (HCC) 06/11/2022   Lung nodule seen on imaging study 06/11/2022   Acute hematogenous osteomyelitis of right foot (HCC) 02/19/2020    Pseudomonas aeruginosa infection 12/24/2019   Controlled type 2 diabetes mellitus with circulatory disorder, without long-term current use of insulin (HCC) 12/22/2019   Osteomyelitis of right fibula (HCC) 12/22/2019   Severe protein-calorie malnutrition (HCC)    Venous stasis ulcers of both lower extremities (HCC)    Bilateral cellulitis of lower leg 08/12/2019   Depression, major, recurrent, mild (HCC) 06/21/2019   Pressure injury of skin 06/19/2019   Elevated C-reactive protein (CRP)    Leg ulcer (HCC)    Adjustment disorder with mixed disturbance of emotions and conduct    Type 2 diabetes mellitus with hyperglycemia (HCC) 05/04/2019   Infected ulcer of skin (HCC)    High risk social situation    Post-polio syndrome    Chronic pain syndrome    Limited mobility 04/20/2019   Venous stasis ulcer (HCC) 03/31/2019   Overgrown toenails 10/28/2018   Wound of right leg 02/06/2018   Atrial fibrillation (HCC) 01/03/2018   Anemia 01/03/2018   Elevated serum creatinine 01/03/2018   Bipolar I disorder with mania (HCC) 01/03/2018   Past Medical History:  Diagnosis Date   Arthritis    Hypoglycemia    Pneumonia, pneumococcal (HCC)     Family History  Family history unknown: Yes    Past Surgical History:  Procedure Laterality Date   ABDOMINAL HYSTERECTOMY     AMPUTATION Right 02/19/2020   Procedure: RIGHT BELOW KNEE AMPUTATION;  Surgeon: Nadara Mustard, MD;  Location: [facility] OR;  Service: Orthopedics;  Laterality: Right;   CESAREAN SECTION     FEMUR IM NAIL Left 06/13/2022   Procedure: INTERNAL FIXATION LEFT FEMUR;  Surgeon: Nadara Mustard, MD;  Location: [facility] OR;  Service: Orthopedics;  Laterality: Left;   Social History   Occupational History   Not on file  Tobacco Use   Smoking status: Light Smoker    Packs/day: 0.25    Types: Cigarettes   Smokeless tobacco: Never  Vaping Use   Vaping Use: Never used  Substance and Sexual Activity   Alcohol use: No   Drug use: No   Sexual  activity: Not on file

## 2023-11-21 ENCOUNTER — Other Ambulatory Visit (HOSPITAL_COMMUNITY): Payer: Self-pay | Admitting: Family Medicine

## 2023-11-21 DIAGNOSIS — M7989 Other specified soft tissue disorders: Secondary | ICD-10-CM

## 2023-12-02 ENCOUNTER — Other Ambulatory Visit (HOSPITAL_COMMUNITY): Payer: Self-pay | Admitting: Family Medicine

## 2023-12-02 ENCOUNTER — Ambulatory Visit (HOSPITAL_COMMUNITY)
Admission: RE | Admit: 2023-12-02 | Discharge: 2023-12-02 | Disposition: A | Discharge status: Home / Self Care | Source: Ambulatory Visit | Attending: Family Medicine | Admitting: Family Medicine

## 2023-12-02 DIAGNOSIS — M7989 Other specified soft tissue disorders: Secondary | ICD-10-CM

## 2024-01-17 NOTE — Progress Notes (Signed)
 REFERRING PHYSICIAN:  Pennelope Castilla PROVIDER:  DEWARD PURCHASE STECHSCHULTE, MD MRN: [id number] DOB: 11-24-1950 DATE OF ENCOUNTER: 01/17/2024 Subjective     Chief Complaint: New Consultation   History of Present Illness: [name] is a 73 y.o. female who is seen today as an office consultation for evaluation of New Consultation    History of Present Illness [name] is a 73 year old female who presents with back pain and a pressure wound. She is accompanied by her mother.  She has been experiencing pain in the middle of her back for the past couple of months, which began spontaneously without any preceding trauma or injury. The pain is exacerbated by pressure, particularly when sitting in her wheelchair, leading her to spend more time in bed to alleviate the discomfort. Repositioning every other hour has provided mixed results in terms of pain relief. The pain is centrally located on her back, and she denies any visible skin changes, although a treatment nurse mentioned that the wound appears to move. She is currently taking antibiotics to manage the condition, and an MRI indicated a slight amount of fluid in the area.  She resides in a facility and uses a wheelchair for mobility. No recent trauma or injury to her back. Her shoulders function adequately, though she experiences tightness when lifting them, with one side being more restricted than the other.     Review of Systems: A complete review of systems was obtained from the patient.  I have reviewed this information and discussed as appropriate with the patient.  See HPI as well for other ROS.  ROS   Medical History: Past Medical History:  Diagnosis Date  . Arrhythmia   . Diabetes mellitus without complication (CMS/HHS-HCC)   . Glaucoma (increased eye pressure)   . Hypertension     There is no problem list on file for this patient.  Past Surgical History:  Procedure Laterality Date  . BELOW KNEE AMPUTATION Right  02/19/2020  . IM nail internal fixation femur  06/23/2022  . CESAREAN SECTION    . HYSTERECTOMY       Allergies  Allergen Reactions  . Acetaminophen  Hives  . Codeine Other (See Comments)    Gets high    No current outpatient medications on file prior to visit.   No current facility-administered medications on file prior to visit.    Family History  Family history unknown: Yes     Social History   Tobacco Use  Smoking Status Never  Smokeless Tobacco Never     Social History   Socioeconomic History  . Marital status: Single  Tobacco Use  . Smoking status: Never  . Smokeless tobacco: Never  Substance and Sexual Activity  . Drug use: Not Currently  . Sexual activity: Not Currently   Social Drivers of Health   Food Insecurity: No Food Insecurity (06/13/2022)   Received from [facility]   Hunger Vital Sign   . Worried About Programme researcher, broadcasting/film/video in the Last Year: Never true   . Ran Out of Food in the Last Year: Never true  Transportation Needs: No Transportation Needs (06/13/2022)   Received from [facility] - Transportation   . Lack of Transportation (Medical): No   . Lack of Transportation (Non-Medical): No    Objective:   Vitals:   01/17/24 0918  BP: 132/71  Temp: 36.7 C (98 F)  SpO2: 93%  Weight: 82.6 kg (182 lb)  PainSc:   4  There is no height or weight on file to calculate BMI.  Physical Exam    Musculoskeletal - limited shoulder mobility bilaterally, but worse on the right with complete inability to abduct shoulder on the right Back - stage 2 pressure ulcer in right upper back with two areas of ulceration, underlying induration without clear fluctuance.  Some weaping thin fluid drainage from wounds.  Physical Exam SKIN: Pressure wound on back   Labs, Imaging and Diagnostic Testing: MRI 12/02/23  IMPRESSION: 1. Subcutaneous inflammation/edema and a small amount of fluid involving the posterior right chest wall. This could  be an area of contusion/hematoma or focal cellulitis. 2. Mild inflammation/edema involving the right shoulder musculature which could reflect a muscle strain or myositis. 3. No discrete mass is identified. 4. No breast masses or other soft tissue mass. 5. No significant bony findings.  Assessment and Plan:     Diagnoses and all orders for this visit:  Pressure injury of right upper back, stage 2 (CMS/HHS-HCC)     Assessment & Plan Pressure ulcer Chronic pressure ulcer located in the mid-back, present for several months, likely due to prolonged pressure from wheelchair use. MRI shows slight fluid accumulation, insufficient for surgical intervention. Current management includes offloading pressure by repositioning every other hour. - Refer to wound clinic for further evaluation and management options. - Continue offloading pressure by repositioning every other hour.  Shoulder muscle inflammation Inflammation in the shoulder muscle identified on CT scan.  Limited range of motion at the shoulders bilaterally, but not able to abduct right shoulder at all.   Although it may contribute to discomfort, the primary pain source is the pressure ulcer. Referral to an orthopedic surgeon is recommended to rule out underlying right shoulder pathology. - Refer to orthopedic surgeon for evaluation.     PAUL JEFFREY STECHSCHULTE, MD    This note has been created using automated tools and reviewed for accuracy by PAUL JEFFREY STECHSCHULTE.

## 2024-02-18 ENCOUNTER — Encounter: Admitting: Orthopedic Surgery

## 2024-02-20 ENCOUNTER — Ambulatory Visit: Admitting: Orthopedic Surgery

## 2024-02-20 ENCOUNTER — Encounter: Payer: Self-pay | Admitting: Orthopedic Surgery

## 2024-02-20 DIAGNOSIS — L98423 Non-pressure chronic ulcer of back with necrosis of muscle: Secondary | ICD-10-CM

## 2024-02-20 NOTE — Progress Notes (Signed)
 Office Visit Note   Patient: [name]           Date of Birth: [birth year]           MRN: [id number] Visit Date: 02/20/2024              Requested by: Lyndel Deward PARAS, MD [phone] [place],  [place] [address] PCP: Dixons Ellen, MD (Inactive)  Chief Complaint  Patient presents with   Middle Back - Wound Check      HPI: Patient is a 73 year old woman who is seen for initial evaluation for thoracic spine necrotic ulcer.  Patient was initially seen by general surgery and was referred here for initial evaluation and treatment.  The skilled facility states the patient currently has an air mattress and is turning frequently.  Assessment & Plan: Visit Diagnoses:  1. 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  Follow-Up Instructions: Return in about 4 weeks (around 03/19/2024).   Ortho Exam  Patient is alert, oriented, no adenopathy, well-dressed, normal affect, normal respiratory effort. Examination patient has a ulcer on the thoracic spine with necrotic skin and soft tissue and necrotic muscle.  The ulcer measures 6 x 4 cm.  There is surrounding dermatitis but no cellulitis no purulent drainage no undermining.    Imaging: No results found.   Labs: Lab Results  Component Value Date   HGBA1C 5.6 06/11/2022   HGBA1C 6.1 (H) 12/22/2019   HGBA1C 6.6 (H) 05/04/2019   ESRSEDRATE 60 (H) 12/28/2019   ESRSEDRATE 102 (H) 12/22/2019   ESRSEDRATE 129 (H) 06/18/2019   CRP 1.1 (H) 12/28/2019   CRP 3.9 (H) 12/22/2019   CRP 21.8 (H) 06/18/2019   REPTSTATUS 03/04/2020 FINAL 03/01/2020   GRAMSTAIN NO WBC SEEN NO ORGANISMS SEEN  03/01/2020   CULT  03/01/2020    RARE CORYNEBACTERIUM STRIATUM Standardized susceptibility testing for this organism is not available. Performed at [facility], [address] [place], [place] [address]    LABORGA PSEUDOMONAS AERUGINOSA 12/22/2019   LABORGA PROTEUS MIRABILIS 12/22/2019     Lab Results  Component Value Date   ALBUMIN 2.8 (L) 06/13/2022   ALBUMIN 3.4 (L) 06/11/2022   ALBUMIN 2.9 (L) 12/23/2019   PREALBUMIN 12.5 (L) 12/22/2019    Lab Results  Component Value Date   MG 2.3 06/13/2022   MG 1.9 06/19/2019   No results found for: [facility]  Lab Results  Component Value Date   PREALBUMIN 12.5 (L) 12/22/2019      Latest Ref Rng & Units 06/13/2022    3:18 AM 06/11/2022    6:05 PM 02/19/2020    1:17 PM  CBC EXTENDED  WBC 4.0 - 10.5 K/uL 7.5  7.4  4.8   RBC 3.87 - 5.11 MIL/uL 3.14  4.20  3.93   Hemoglobin 12.0 - 15.0 g/dL 8.8  87.9  9.9   HCT 63.9 - 46.0 % 28.2  38.9  34.3   Platelets 150 - 400 K/uL 215  237  295   NEUT# 1.7 - 7.7 K/uL  5.6    Lymph# 0.7 - 4.0 K/uL  1.0       There is no height or weight on file to calculate BMI.  Orders:  No orders of the defined types were placed in this encounter.  No orders of the  defined types were placed in this encounter.    Procedures: No procedures performed  Clinical Data: No additional findings.  ROS:  All other systems negative, except as noted in the HPI. Review of Systems  Objective: Vital Signs: There were no vitals taken for this visit.  Specialty Comments:  No specialty comments available.  PMFS History: Patient Active Problem List   Diagnosis Date Noted   Closed fracture of distal end of left femur, unspecified fracture morphology, initial encounter (HCC) 06/11/2022   Lung nodule seen on imaging study 06/11/2022   Acute hematogenous osteomyelitis of right foot (HCC) 02/19/2020   Pseudomonas aeruginosa infection 12/24/2019   Controlled type 2 diabetes mellitus with circulatory disorder, without long-term current use of insulin  (HCC) 12/22/2019   Osteomyelitis of right fibula (HCC) 12/22/2019   Severe protein-calorie malnutrition (HCC)    Venous stasis  ulcers of both lower extremities (HCC)    Bilateral cellulitis of lower leg 08/12/2019   Depression, major, recurrent, mild (HCC) 06/21/2019   Pressure injury of skin 06/19/2019   Elevated C-reactive protein (CRP)    Leg ulcer (HCC)    Adjustment disorder with mixed disturbance of emotions and conduct    Type 2 diabetes mellitus with hyperglycemia (HCC) 05/04/2019   Infected ulcer of skin (HCC)    High risk social situation    Post-polio syndrome    Chronic pain syndrome    Limited mobility 04/20/2019   Venous stasis ulcer (HCC) 03/31/2019   Overgrown toenails 10/28/2018   Wound of right leg 02/06/2018   Atrial fibrillation (HCC) 01/03/2018   Anemia 01/03/2018   Elevated serum creatinine 01/03/2018   Bipolar I disorder with mania (HCC) 01/03/2018   Past Medical History:  Diagnosis Date   Arthritis    Hypoglycemia    Pneumonia, pneumococcal (HCC)     Family History  Family history unknown: Yes    Past Surgical History:  Procedure Laterality Date   ABDOMINAL HYSTERECTOMY     AMPUTATION Right 02/19/2020   Procedure: RIGHT BELOW KNEE AMPUTATION;  Surgeon: Harden Jerona GAILS, MD;  Location: [facility] OR;  Service: Orthopedics;  Laterality: Right;   CESAREAN SECTION     FEMUR IM NAIL Left 06/13/2022   Procedure: INTERNAL FIXATION LEFT FEMUR;  Surgeon: Harden Jerona GAILS, MD;  Location: [facility] OR;  Service: Orthopedics;  Laterality: Left;   Social History   Occupational History   Not on file  Tobacco Use   Smoking status: Light Smoker    Current packs/day: 0.25    Types: Cigarettes   Smokeless tobacco: Never  Vaping Use   Vaping status: Never Used  Substance and Sexual Activity   Alcohol use: No   Drug use: No   Sexual activity: Not on file

## 2024-03-18 ENCOUNTER — Encounter (HOSPITAL_BASED_OUTPATIENT_CLINIC_OR_DEPARTMENT_OTHER): Attending: General Surgery | Admitting: General Surgery

## 2024-03-18 DIAGNOSIS — Z89511 Acquired absence of right leg below knee: Secondary | ICD-10-CM | POA: Insufficient documentation

## 2024-03-18 DIAGNOSIS — E11622 Type 2 diabetes mellitus with other skin ulcer: Secondary | ICD-10-CM | POA: Diagnosis present

## 2024-03-18 DIAGNOSIS — L89109 Pressure ulcer of unspecified part of back, unspecified stage: Secondary | ICD-10-CM | POA: Diagnosis not present

## 2024-03-19 ENCOUNTER — Ambulatory Visit: Admitting: Orthopedic Surgery

## 2024-04-06 ENCOUNTER — Encounter (HOSPITAL_BASED_OUTPATIENT_CLINIC_OR_DEPARTMENT_OTHER): Admitting: General Surgery

## 2024-04-10 ENCOUNTER — Encounter (HOSPITAL_BASED_OUTPATIENT_CLINIC_OR_DEPARTMENT_OTHER): Attending: General Surgery | Admitting: General Surgery

## 2024-04-10 DIAGNOSIS — L89109 Pressure ulcer of unspecified part of back, unspecified stage: Secondary | ICD-10-CM | POA: Insufficient documentation

## 2024-04-10 DIAGNOSIS — Z89511 Acquired absence of right leg below knee: Secondary | ICD-10-CM | POA: Insufficient documentation

## 2024-04-24 ENCOUNTER — Encounter (HOSPITAL_BASED_OUTPATIENT_CLINIC_OR_DEPARTMENT_OTHER): Admitting: General Surgery

## 2024-04-24 DIAGNOSIS — L89109 Pressure ulcer of unspecified part of back, unspecified stage: Secondary | ICD-10-CM | POA: Diagnosis not present

## 2024-05-08 ENCOUNTER — Encounter (HOSPITAL_BASED_OUTPATIENT_CLINIC_OR_DEPARTMENT_OTHER): Attending: General Surgery | Admitting: General Surgery

## 2024-05-08 DIAGNOSIS — E11622 Type 2 diabetes mellitus with other skin ulcer: Secondary | ICD-10-CM | POA: Insufficient documentation

## 2024-05-08 DIAGNOSIS — L89109 Pressure ulcer of unspecified part of back, unspecified stage: Secondary | ICD-10-CM | POA: Diagnosis not present

## 2024-05-08 DIAGNOSIS — Z89511 Acquired absence of right leg below knee: Secondary | ICD-10-CM | POA: Diagnosis not present

## 2024-05-25 ENCOUNTER — Ambulatory Visit (HOSPITAL_BASED_OUTPATIENT_CLINIC_OR_DEPARTMENT_OTHER): Admitting: General Surgery

## 2024-06-02 ENCOUNTER — Encounter (HOSPITAL_BASED_OUTPATIENT_CLINIC_OR_DEPARTMENT_OTHER): Attending: General Surgery | Admitting: General Surgery

## 2024-06-02 DIAGNOSIS — E11622 Type 2 diabetes mellitus with other skin ulcer: Secondary | ICD-10-CM | POA: Insufficient documentation

## 2024-06-02 DIAGNOSIS — Z89511 Acquired absence of right leg below knee: Secondary | ICD-10-CM | POA: Diagnosis not present

## 2024-06-02 DIAGNOSIS — L89109 Pressure ulcer of unspecified part of back, unspecified stage: Secondary | ICD-10-CM | POA: Insufficient documentation

## 2024-06-16 ENCOUNTER — Encounter (HOSPITAL_BASED_OUTPATIENT_CLINIC_OR_DEPARTMENT_OTHER): Admitting: General Surgery

## 2024-06-16 DIAGNOSIS — E11622 Type 2 diabetes mellitus with other skin ulcer: Secondary | ICD-10-CM | POA: Diagnosis not present

## 2024-06-30 ENCOUNTER — Encounter (HOSPITAL_BASED_OUTPATIENT_CLINIC_OR_DEPARTMENT_OTHER): Admitting: General Surgery

## 2024-07-14 ENCOUNTER — Encounter (HOSPITAL_BASED_OUTPATIENT_CLINIC_OR_DEPARTMENT_OTHER): Attending: General Surgery | Admitting: General Surgery

## 2024-07-14 DIAGNOSIS — Z89511 Acquired absence of right leg below knee: Secondary | ICD-10-CM | POA: Insufficient documentation

## 2024-07-14 DIAGNOSIS — L89109 Pressure ulcer of unspecified part of back, unspecified stage: Secondary | ICD-10-CM | POA: Insufficient documentation

## 2024-07-14 DIAGNOSIS — E11622 Type 2 diabetes mellitus with other skin ulcer: Secondary | ICD-10-CM | POA: Insufficient documentation

## 2024-08-13 ENCOUNTER — Encounter (HOSPITAL_BASED_OUTPATIENT_CLINIC_OR_DEPARTMENT_OTHER): Attending: General Surgery | Admitting: General Surgery

## 2024-08-13 DIAGNOSIS — Z89511 Acquired absence of right leg below knee: Secondary | ICD-10-CM | POA: Insufficient documentation

## 2024-08-13 DIAGNOSIS — E11622 Type 2 diabetes mellitus with other skin ulcer: Secondary | ICD-10-CM | POA: Insufficient documentation

## 2024-08-13 DIAGNOSIS — L89109 Pressure ulcer of unspecified part of back, unspecified stage: Secondary | ICD-10-CM | POA: Insufficient documentation

## 2024-09-10 ENCOUNTER — Encounter (HOSPITAL_BASED_OUTPATIENT_CLINIC_OR_DEPARTMENT_OTHER): Attending: General Surgery | Admitting: General Surgery

## 2024-10-08 ENCOUNTER — Encounter (HOSPITAL_BASED_OUTPATIENT_CLINIC_OR_DEPARTMENT_OTHER): Admitting: General Surgery
